# Patient Record
Sex: Female | Born: 1975 | Race: White | Hispanic: No | Marital: Single | Smoking: Current every day smoker
Health system: Southern US, Community
[De-identification: ages and names within clinical notes are randomized; demographics above are authoritative.]

## PROBLEM LIST (undated history)

## (undated) ENCOUNTER — Inpatient Hospital Stay (HOSPITAL_COMMUNITY): Payer: Self-pay

## (undated) DIAGNOSIS — IMO0002 Reserved for concepts with insufficient information to code with codable children: Secondary | ICD-10-CM

## (undated) DIAGNOSIS — B1920 Unspecified viral hepatitis C without hepatic coma: Secondary | ICD-10-CM

## (undated) DIAGNOSIS — K219 Gastro-esophageal reflux disease without esophagitis: Secondary | ICD-10-CM

## (undated) DIAGNOSIS — Z3490 Encounter for supervision of normal pregnancy, unspecified, unspecified trimester: Secondary | ICD-10-CM

## (undated) DIAGNOSIS — R51 Headache: Secondary | ICD-10-CM

## (undated) DIAGNOSIS — A64 Unspecified sexually transmitted disease: Secondary | ICD-10-CM

## (undated) DIAGNOSIS — R112 Nausea with vomiting, unspecified: Secondary | ICD-10-CM

## (undated) DIAGNOSIS — Q513 Bicornate uterus: Secondary | ICD-10-CM

## (undated) DIAGNOSIS — A749 Chlamydial infection, unspecified: Secondary | ICD-10-CM

## (undated) DIAGNOSIS — I10 Essential (primary) hypertension: Secondary | ICD-10-CM

## (undated) DIAGNOSIS — F419 Anxiety disorder, unspecified: Secondary | ICD-10-CM

## (undated) DIAGNOSIS — F1120 Opioid dependence, uncomplicated: Secondary | ICD-10-CM

## (undated) DIAGNOSIS — Z9889 Other specified postprocedural states: Secondary | ICD-10-CM

## (undated) DIAGNOSIS — Z331 Pregnant state, incidental: Secondary | ICD-10-CM

## (undated) HISTORY — DX: Gastro-esophageal reflux disease without esophagitis: K21.9

## (undated) HISTORY — PX: THERAPEUTIC ABORTION: SHX798

## (undated) HISTORY — DX: Reserved for concepts with insufficient information to code with codable children: IMO0002

## (undated) HISTORY — DX: Unspecified sexually transmitted disease: A64

## (undated) HISTORY — DX: Opioid dependence, uncomplicated: F11.20

## (undated) HISTORY — PX: DILATION AND CURETTAGE OF UTERUS: SHX78

## (undated) HISTORY — DX: Unspecified viral hepatitis C without hepatic coma: B19.20

## (undated) HISTORY — PX: TONSILLECTOMY: SUR1361

## (undated) HISTORY — DX: Bicornate uterus: Q51.3

---
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
## 1998-06-07 ENCOUNTER — Other Ambulatory Visit: Admission: RE | Admit: 1998-06-07 | Discharge: 1998-06-07 | Payer: Self-pay | Admitting: Obstetrics and Gynecology

## 1998-11-23 DIAGNOSIS — A749 Chlamydial infection, unspecified: Secondary | ICD-10-CM

## 1998-11-23 HISTORY — DX: Chlamydial infection, unspecified: A74.9

## 1999-03-16 ENCOUNTER — Encounter: Payer: Self-pay | Admitting: Obstetrics and Gynecology

## 1999-03-16 ENCOUNTER — Inpatient Hospital Stay (HOSPITAL_COMMUNITY): Admission: AD | Admit: 1999-03-16 | Discharge: 1999-03-16 | Payer: Self-pay | Admitting: Obstetrics and Gynecology

## 1999-04-03 ENCOUNTER — Other Ambulatory Visit: Admission: RE | Admit: 1999-04-03 | Discharge: 1999-04-03 | Payer: Self-pay | Admitting: *Deleted

## 1999-08-22 ENCOUNTER — Inpatient Hospital Stay (HOSPITAL_COMMUNITY): Admission: AD | Admit: 1999-08-22 | Discharge: 1999-08-22 | Payer: Self-pay | Admitting: Obstetrics and Gynecology

## 1999-10-14 ENCOUNTER — Inpatient Hospital Stay (HOSPITAL_COMMUNITY): Admission: AD | Admit: 1999-10-14 | Discharge: 1999-10-14 | Payer: Self-pay | Admitting: Obstetrics & Gynecology

## 1999-10-16 ENCOUNTER — Inpatient Hospital Stay (HOSPITAL_COMMUNITY): Admission: AD | Admit: 1999-10-16 | Discharge: 1999-10-16 | Payer: Self-pay | Admitting: Obstetrics and Gynecology

## 1999-10-24 ENCOUNTER — Inpatient Hospital Stay (HOSPITAL_COMMUNITY): Admission: AD | Admit: 1999-10-24 | Discharge: 1999-10-24 | Payer: Self-pay | Admitting: Obstetrics and Gynecology

## 1999-10-28 ENCOUNTER — Inpatient Hospital Stay (HOSPITAL_COMMUNITY): Admission: AD | Admit: 1999-10-28 | Discharge: 1999-10-30 | Payer: Self-pay | Admitting: Obstetrics and Gynecology

## 1999-11-01 ENCOUNTER — Encounter: Admission: RE | Admit: 1999-11-01 | Discharge: 2000-01-30 | Payer: Self-pay | Admitting: Obstetrics and Gynecology

## 1999-11-03 ENCOUNTER — Other Ambulatory Visit: Admission: RE | Admit: 1999-11-03 | Discharge: 1999-11-03 | Payer: Self-pay | Admitting: *Deleted

## 1999-11-03 ENCOUNTER — Encounter (INDEPENDENT_AMBULATORY_CARE_PROVIDER_SITE_OTHER): Payer: Self-pay | Admitting: Specialist

## 2000-03-01 ENCOUNTER — Encounter: Admission: RE | Admit: 2000-03-01 | Discharge: 2000-05-30 | Payer: Self-pay | Admitting: Obstetrics and Gynecology

## 2000-05-31 ENCOUNTER — Encounter: Admission: RE | Admit: 2000-05-31 | Discharge: 2000-08-29 | Payer: Self-pay | Admitting: Obstetrics and Gynecology

## 2000-08-31 ENCOUNTER — Encounter: Admission: RE | Admit: 2000-08-31 | Discharge: 2000-11-29 | Payer: Self-pay | Admitting: Obstetrics and Gynecology

## 2000-10-04 ENCOUNTER — Other Ambulatory Visit: Admission: RE | Admit: 2000-10-04 | Discharge: 2000-10-04 | Payer: Self-pay | Admitting: *Deleted

## 2000-12-01 ENCOUNTER — Encounter: Admission: RE | Admit: 2000-12-01 | Discharge: 2001-03-01 | Payer: Self-pay | Admitting: Obstetrics and Gynecology

## 2001-03-28 ENCOUNTER — Other Ambulatory Visit: Admission: RE | Admit: 2001-03-28 | Discharge: 2001-03-28 | Payer: Self-pay | Admitting: *Deleted

## 2001-03-31 ENCOUNTER — Encounter: Admission: RE | Admit: 2001-03-31 | Discharge: 2001-04-30 | Payer: Self-pay | Admitting: Obstetrics and Gynecology

## 2001-05-01 ENCOUNTER — Encounter: Admission: RE | Admit: 2001-05-01 | Discharge: 2001-05-31 | Payer: Self-pay | Admitting: Obstetrics and Gynecology

## 2001-06-29 ENCOUNTER — Emergency Department (HOSPITAL_COMMUNITY): Admission: EM | Admit: 2001-06-29 | Discharge: 2001-06-29 | Payer: Self-pay | Admitting: Emergency Medicine

## 2001-07-06 ENCOUNTER — Ambulatory Visit (HOSPITAL_COMMUNITY): Admission: RE | Admit: 2001-07-06 | Discharge: 2001-07-06 | Payer: Self-pay | Admitting: *Deleted

## 2001-07-30 ENCOUNTER — Emergency Department (HOSPITAL_COMMUNITY): Admission: EM | Admit: 2001-07-30 | Discharge: 2001-07-30 | Payer: Self-pay | Admitting: Emergency Medicine

## 2001-10-15 ENCOUNTER — Inpatient Hospital Stay (HOSPITAL_COMMUNITY): Admission: EM | Admit: 2001-10-15 | Discharge: 2001-10-19 | Payer: Self-pay | Admitting: Psychiatry

## 2001-11-23 HISTORY — PX: FINGER SURGERY: SHX640

## 2001-12-09 ENCOUNTER — Encounter: Admission: RE | Admit: 2001-12-09 | Discharge: 2001-12-09 | Payer: Self-pay | Admitting: *Deleted

## 2002-05-19 ENCOUNTER — Encounter: Admission: RE | Admit: 2002-05-19 | Discharge: 2002-05-19 | Payer: Self-pay | Admitting: *Deleted

## 2002-06-13 ENCOUNTER — Inpatient Hospital Stay (HOSPITAL_COMMUNITY): Admission: EM | Admit: 2002-06-13 | Discharge: 2002-06-20 | Payer: Self-pay | Admitting: Psychiatry

## 2002-07-01 ENCOUNTER — Inpatient Hospital Stay (HOSPITAL_COMMUNITY): Admission: EM | Admit: 2002-07-01 | Discharge: 2002-07-13 | Payer: Self-pay | Admitting: *Deleted

## 2002-07-07 ENCOUNTER — Encounter (INDEPENDENT_AMBULATORY_CARE_PROVIDER_SITE_OTHER): Payer: Self-pay | Admitting: Specialist

## 2002-07-25 ENCOUNTER — Emergency Department (HOSPITAL_COMMUNITY): Admission: EM | Admit: 2002-07-25 | Discharge: 2002-07-26 | Payer: Self-pay | Admitting: Emergency Medicine

## 2002-07-25 ENCOUNTER — Encounter: Payer: Self-pay | Admitting: Emergency Medicine

## 2002-07-26 ENCOUNTER — Inpatient Hospital Stay: Admission: AD | Admit: 2002-07-26 | Discharge: 2002-07-26 | Payer: Self-pay | Admitting: Obstetrics and Gynecology

## 2002-07-26 ENCOUNTER — Encounter: Payer: Self-pay | Admitting: Emergency Medicine

## 2002-11-14 ENCOUNTER — Encounter: Payer: Self-pay | Admitting: Emergency Medicine

## 2002-11-14 ENCOUNTER — Observation Stay (HOSPITAL_COMMUNITY): Admission: EM | Admit: 2002-11-14 | Discharge: 2002-11-15 | Payer: Self-pay | Admitting: Emergency Medicine

## 2003-05-27 ENCOUNTER — Inpatient Hospital Stay (HOSPITAL_COMMUNITY): Admission: AD | Admit: 2003-05-27 | Discharge: 2003-05-27 | Payer: Self-pay | Admitting: Obstetrics and Gynecology

## 2003-08-10 ENCOUNTER — Emergency Department (HOSPITAL_COMMUNITY): Admission: EM | Admit: 2003-08-10 | Discharge: 2003-08-11 | Payer: Self-pay | Admitting: Emergency Medicine

## 2003-08-10 ENCOUNTER — Encounter: Payer: Self-pay | Admitting: Emergency Medicine

## 2003-08-11 ENCOUNTER — Encounter: Payer: Self-pay | Admitting: Emergency Medicine

## 2003-08-11 ENCOUNTER — Emergency Department (HOSPITAL_COMMUNITY): Admission: EM | Admit: 2003-08-11 | Discharge: 2003-08-12 | Payer: Self-pay | Admitting: Emergency Medicine

## 2003-08-15 ENCOUNTER — Encounter: Payer: Self-pay | Admitting: Internal Medicine

## 2003-08-15 ENCOUNTER — Inpatient Hospital Stay (HOSPITAL_COMMUNITY): Admission: EM | Admit: 2003-08-15 | Discharge: 2003-08-17 | Payer: Self-pay

## 2003-08-16 ENCOUNTER — Encounter: Payer: Self-pay | Admitting: Internal Medicine

## 2003-08-21 ENCOUNTER — Inpatient Hospital Stay (HOSPITAL_COMMUNITY): Admission: EM | Admit: 2003-08-21 | Discharge: 2003-08-22 | Payer: Self-pay | Admitting: Emergency Medicine

## 2003-08-28 ENCOUNTER — Emergency Department (HOSPITAL_COMMUNITY): Admission: EM | Admit: 2003-08-28 | Discharge: 2003-08-29 | Payer: Self-pay | Admitting: *Deleted

## 2003-08-30 ENCOUNTER — Emergency Department (HOSPITAL_COMMUNITY): Admission: EM | Admit: 2003-08-30 | Discharge: 2003-08-31 | Payer: Self-pay | Admitting: Emergency Medicine

## 2003-08-31 ENCOUNTER — Encounter: Payer: Self-pay | Admitting: Emergency Medicine

## 2003-08-31 ENCOUNTER — Emergency Department (HOSPITAL_COMMUNITY): Admission: EM | Admit: 2003-08-31 | Discharge: 2003-08-31 | Payer: Self-pay | Admitting: Emergency Medicine

## 2003-09-04 ENCOUNTER — Encounter: Payer: Self-pay | Admitting: Emergency Medicine

## 2003-09-04 ENCOUNTER — Inpatient Hospital Stay (HOSPITAL_COMMUNITY): Admission: EM | Admit: 2003-09-04 | Discharge: 2003-09-07 | Payer: Self-pay | Admitting: Psychiatry

## 2003-10-31 ENCOUNTER — Emergency Department (HOSPITAL_COMMUNITY): Admission: EM | Admit: 2003-10-31 | Discharge: 2003-11-01 | Payer: Self-pay | Admitting: Emergency Medicine

## 2003-11-01 ENCOUNTER — Emergency Department (HOSPITAL_COMMUNITY): Admission: EM | Admit: 2003-11-01 | Discharge: 2003-11-01 | Payer: Self-pay | Admitting: Emergency Medicine

## 2003-11-05 ENCOUNTER — Emergency Department (HOSPITAL_COMMUNITY): Admission: EM | Admit: 2003-11-05 | Discharge: 2003-11-06 | Payer: Self-pay | Admitting: Emergency Medicine

## 2003-11-05 ENCOUNTER — Emergency Department (HOSPITAL_COMMUNITY): Admission: EM | Admit: 2003-11-05 | Discharge: 2003-11-05 | Payer: Self-pay | Admitting: Emergency Medicine

## 2003-11-12 ENCOUNTER — Other Ambulatory Visit: Admission: RE | Admit: 2003-11-12 | Discharge: 2003-11-12 | Payer: Self-pay | Admitting: Obstetrics and Gynecology

## 2004-03-18 ENCOUNTER — Emergency Department (HOSPITAL_COMMUNITY): Admission: EM | Admit: 2004-03-18 | Discharge: 2004-03-18 | Payer: Self-pay | Admitting: Emergency Medicine

## 2004-03-18 ENCOUNTER — Inpatient Hospital Stay (HOSPITAL_COMMUNITY): Admission: AD | Admit: 2004-03-18 | Discharge: 2004-03-18 | Payer: Self-pay | Admitting: Obstetrics and Gynecology

## 2004-05-26 ENCOUNTER — Inpatient Hospital Stay (HOSPITAL_COMMUNITY): Admission: AD | Admit: 2004-05-26 | Discharge: 2004-05-26 | Payer: Self-pay | Admitting: Obstetrics and Gynecology

## 2004-07-04 ENCOUNTER — Emergency Department (HOSPITAL_COMMUNITY): Admission: EM | Admit: 2004-07-04 | Discharge: 2004-07-04 | Payer: Self-pay | Admitting: Family Medicine

## 2004-07-14 ENCOUNTER — Inpatient Hospital Stay (HOSPITAL_COMMUNITY): Admission: AD | Admit: 2004-07-14 | Discharge: 2004-07-15 | Payer: Self-pay | Admitting: Obstetrics and Gynecology

## 2004-07-20 ENCOUNTER — Emergency Department (HOSPITAL_COMMUNITY): Admission: EM | Admit: 2004-07-20 | Discharge: 2004-07-20 | Payer: Self-pay | Admitting: Emergency Medicine

## 2004-09-15 ENCOUNTER — Emergency Department (HOSPITAL_COMMUNITY): Admission: EM | Admit: 2004-09-15 | Discharge: 2004-09-15 | Payer: Self-pay | Admitting: Family Medicine

## 2004-09-16 ENCOUNTER — Emergency Department (HOSPITAL_COMMUNITY): Admission: EM | Admit: 2004-09-16 | Discharge: 2004-09-16 | Payer: Self-pay | Admitting: Emergency Medicine

## 2004-11-11 ENCOUNTER — Emergency Department (HOSPITAL_COMMUNITY): Admission: EM | Admit: 2004-11-11 | Discharge: 2004-11-11 | Payer: Self-pay | Admitting: Family Medicine

## 2004-11-19 ENCOUNTER — Emergency Department (HOSPITAL_COMMUNITY): Admission: EM | Admit: 2004-11-19 | Discharge: 2004-11-19 | Payer: Self-pay | Admitting: *Deleted

## 2004-12-01 ENCOUNTER — Emergency Department (HOSPITAL_COMMUNITY): Admission: EM | Admit: 2004-12-01 | Discharge: 2004-12-01 | Payer: Self-pay | Admitting: Family Medicine

## 2005-01-10 ENCOUNTER — Emergency Department (HOSPITAL_COMMUNITY): Admission: EM | Admit: 2005-01-10 | Discharge: 2005-01-10 | Payer: Self-pay | Admitting: Emergency Medicine

## 2005-01-23 ENCOUNTER — Ambulatory Visit: Payer: Self-pay | Admitting: Psychiatry

## 2005-01-23 ENCOUNTER — Inpatient Hospital Stay (HOSPITAL_COMMUNITY): Admission: RE | Admit: 2005-01-23 | Discharge: 2005-01-29 | Payer: Self-pay | Admitting: Psychiatry

## 2005-02-27 ENCOUNTER — Emergency Department (HOSPITAL_COMMUNITY): Admission: EM | Admit: 2005-02-27 | Discharge: 2005-02-27 | Payer: Self-pay | Admitting: Family Medicine

## 2005-03-11 ENCOUNTER — Emergency Department (HOSPITAL_COMMUNITY): Admission: EM | Admit: 2005-03-11 | Discharge: 2005-03-11 | Payer: Self-pay | Admitting: Emergency Medicine

## 2005-03-26 ENCOUNTER — Ambulatory Visit: Payer: Self-pay | Admitting: Psychiatry

## 2005-03-26 ENCOUNTER — Inpatient Hospital Stay (HOSPITAL_COMMUNITY): Admission: RE | Admit: 2005-03-26 | Discharge: 2005-03-30 | Payer: Self-pay | Admitting: Psychiatry

## 2005-04-13 ENCOUNTER — Emergency Department (HOSPITAL_COMMUNITY): Admission: EM | Admit: 2005-04-13 | Discharge: 2005-04-13 | Payer: Self-pay | Admitting: Emergency Medicine

## 2005-06-11 ENCOUNTER — Emergency Department (HOSPITAL_COMMUNITY): Admission: EM | Admit: 2005-06-11 | Discharge: 2005-06-11 | Payer: Self-pay | Admitting: Emergency Medicine

## 2005-07-09 ENCOUNTER — Emergency Department (HOSPITAL_COMMUNITY): Admission: EM | Admit: 2005-07-09 | Discharge: 2005-07-09 | Payer: Self-pay | Admitting: Emergency Medicine

## 2005-10-02 ENCOUNTER — Other Ambulatory Visit: Admission: RE | Admit: 2005-10-02 | Discharge: 2005-10-02 | Payer: Self-pay | Admitting: Family Medicine

## 2005-12-23 ENCOUNTER — Emergency Department (HOSPITAL_COMMUNITY): Admission: EM | Admit: 2005-12-23 | Discharge: 2005-12-23 | Payer: Self-pay | Admitting: Family Medicine

## 2005-12-25 ENCOUNTER — Ambulatory Visit (HOSPITAL_COMMUNITY): Admission: RE | Admit: 2005-12-25 | Discharge: 2005-12-25 | Payer: Self-pay | Admitting: Family Medicine

## 2006-01-26 ENCOUNTER — Emergency Department (HOSPITAL_COMMUNITY): Admission: EM | Admit: 2006-01-26 | Discharge: 2006-01-26 | Payer: Self-pay | Admitting: Emergency Medicine

## 2006-02-02 ENCOUNTER — Inpatient Hospital Stay (HOSPITAL_COMMUNITY): Admission: AD | Admit: 2006-02-02 | Discharge: 2006-02-02 | Payer: Self-pay | Admitting: Obstetrics and Gynecology

## 2006-08-24 ENCOUNTER — Emergency Department (HOSPITAL_COMMUNITY): Admission: EM | Admit: 2006-08-24 | Discharge: 2006-08-24 | Payer: Self-pay | Admitting: Emergency Medicine

## 2006-10-30 ENCOUNTER — Emergency Department (HOSPITAL_COMMUNITY): Admission: EM | Admit: 2006-10-30 | Discharge: 2006-10-30 | Payer: Self-pay | Admitting: Emergency Medicine

## 2007-01-23 ENCOUNTER — Emergency Department (HOSPITAL_COMMUNITY): Admission: EM | Admit: 2007-01-23 | Discharge: 2007-01-24 | Payer: Self-pay | Admitting: Emergency Medicine

## 2007-04-05 ENCOUNTER — Inpatient Hospital Stay (HOSPITAL_COMMUNITY): Admission: RE | Admit: 2007-04-05 | Discharge: 2007-04-08 | Payer: Self-pay | Admitting: Psychiatry

## 2007-04-05 ENCOUNTER — Ambulatory Visit: Payer: Self-pay | Admitting: Psychiatry

## 2007-06-28 ENCOUNTER — Emergency Department (HOSPITAL_COMMUNITY): Admission: EM | Admit: 2007-06-28 | Discharge: 2007-06-28 | Payer: Self-pay | Admitting: Emergency Medicine

## 2007-08-17 ENCOUNTER — Emergency Department (HOSPITAL_COMMUNITY): Admission: EM | Admit: 2007-08-17 | Discharge: 2007-08-17 | Payer: Self-pay | Admitting: Emergency Medicine

## 2008-11-23 DIAGNOSIS — I10 Essential (primary) hypertension: Secondary | ICD-10-CM

## 2008-11-23 HISTORY — DX: Essential (primary) hypertension: I10

## 2010-05-07 ENCOUNTER — Emergency Department (HOSPITAL_COMMUNITY): Admission: EM | Admit: 2010-05-07 | Discharge: 2010-05-07 | Payer: Self-pay | Admitting: Emergency Medicine

## 2010-10-11 ENCOUNTER — Emergency Department (HOSPITAL_COMMUNITY): Admission: EM | Admit: 2010-10-11 | Discharge: 2010-10-12 | Payer: Self-pay | Admitting: Emergency Medicine

## 2010-11-14 ENCOUNTER — Emergency Department (HOSPITAL_COMMUNITY)
Admission: EM | Admit: 2010-11-14 | Discharge: 2010-11-14 | Payer: Self-pay | Source: Home / Self Care | Admitting: Emergency Medicine

## 2010-11-23 NOTE — L&D Delivery Note (Signed)
Delivery Note At 5:43 PM a non-viable female was delivered via normal spontaneous vaginal delivery, (Presentation: occiput anterior).     Placenta status: delivered intact. Cord:  3 vessel cord.  No complications.    Anesthesia:  Fentanyl PCA Episiotomy: none Lacerations: none Est. Blood Loss (mL): less than 300 mL  Mom to postpartum.  Baby to NA.  Yittel Emrich JEHIEL 09/15/2011, 6:06 PM

## 2011-01-18 ENCOUNTER — Emergency Department (HOSPITAL_COMMUNITY)
Admission: EM | Admit: 2011-01-18 | Discharge: 2011-01-19 | Disposition: A | Discharge status: Home / Self Care | Payer: Self-pay | Attending: Emergency Medicine | Admitting: Emergency Medicine

## 2011-01-18 DIAGNOSIS — F1120 Opioid dependence, uncomplicated: Secondary | ICD-10-CM | POA: Insufficient documentation

## 2011-01-18 DIAGNOSIS — N898 Other specified noninflammatory disorders of vagina: Secondary | ICD-10-CM | POA: Insufficient documentation

## 2011-01-18 DIAGNOSIS — F17200 Nicotine dependence, unspecified, uncomplicated: Secondary | ICD-10-CM | POA: Insufficient documentation

## 2011-01-18 DIAGNOSIS — E669 Obesity, unspecified: Secondary | ICD-10-CM | POA: Insufficient documentation

## 2011-01-18 DIAGNOSIS — F19939 Other psychoactive substance use, unspecified with withdrawal, unspecified: Secondary | ICD-10-CM | POA: Insufficient documentation

## 2011-01-18 LAB — URINALYSIS, ROUTINE W REFLEX MICROSCOPIC
Bilirubin Urine: NEGATIVE
Hgb urine dipstick: NEGATIVE
Ketones, ur: NEGATIVE mg/dL
Nitrite: NEGATIVE
Protein, ur: NEGATIVE mg/dL
Specific Gravity, Urine: 1.013 (ref 1.005–1.030)
Urine Glucose, Fasting: NEGATIVE mg/dL
Urobilinogen, UA: 1 mg/dL (ref 0.0–1.0)
pH: 7 (ref 5.0–8.0)

## 2011-01-18 LAB — DIFFERENTIAL
Basophils Absolute: 0 10*3/uL (ref 0.0–0.1)
Basophils Relative: 0 % (ref 0–1)
Eosinophils Relative: 1 % (ref 0–5)
Lymphocytes Relative: 38 % (ref 12–46)
Neutro Abs: 4.6 10*3/uL (ref 1.7–7.7)

## 2011-01-18 LAB — POCT I-STAT, CHEM 8
BUN: <3 mg/dL — ABNORMAL LOW (ref 6–23)
Calcium, Ion: 1.21 mmol/L (ref 1.12–1.32)
Chloride: 102 mEq/L (ref 96–112)
Creatinine, Ser: 0.8 mg/dL (ref 0.4–1.2)
Glucose, Bld: 100 mg/dL — ABNORMAL HIGH (ref 70–99)
HCT: 47 % — ABNORMAL HIGH (ref 36.0–46.0)
Hemoglobin: 16 g/dL — ABNORMAL HIGH (ref 12.0–15.0)
Potassium: 3.7 mEq/L (ref 3.5–5.1)
Sodium: 138 mEq/L (ref 135–145)
TCO2: 24 mmol/L (ref 0–100)

## 2011-01-18 LAB — POCT PREGNANCY, URINE: Preg Test, Ur: NEGATIVE

## 2011-01-18 LAB — WET PREP, GENITAL
Clue Cells Wet Prep HPF POC: NONE SEEN
Trich, Wet Prep: NONE SEEN
Yeast Wet Prep HPF POC: NONE SEEN

## 2011-01-18 LAB — CBC
HCT: 43.9 % (ref 36.0–46.0)
Platelets: 333 10*3/uL (ref 150–400)
RDW: 15.6 % — ABNORMAL HIGH (ref 11.5–15.5)
WBC: 9.1 10*3/uL (ref 4.0–10.5)

## 2011-01-20 LAB — GC/CHLAMYDIA PROBE AMP, GENITAL
Chlamydia, DNA Probe: NEGATIVE
GC Probe Amp, Genital: NEGATIVE

## 2011-02-02 LAB — DIFFERENTIAL
Basophils Absolute: 0 10*3/uL (ref 0.0–0.1)
Lymphocytes Relative: 24 % (ref 12–46)
Lymphs Abs: 2.3 10*3/uL (ref 0.7–4.0)
Neutro Abs: 6.5 10*3/uL (ref 1.7–7.7)
Neutrophils Relative %: 69 % (ref 43–77)

## 2011-02-02 LAB — BASIC METABOLIC PANEL
Calcium: 10.2 mg/dL (ref 8.4–10.5)
Chloride: 103 mEq/L (ref 96–112)
Creatinine, Ser: 0.89 mg/dL (ref 0.4–1.2)
GFR calc Af Amer: >60 mL/min (ref >60)
GFR calc non Af Amer: >60 mL/min (ref >60)

## 2011-02-02 LAB — CBC
Platelets: 367 10*3/uL (ref 150–400)
RBC: 5.49 MIL/uL — ABNORMAL HIGH (ref 3.87–5.11)
WBC: 9.5 10*3/uL (ref 4.0–10.5)

## 2011-02-02 LAB — POCT PREGNANCY, URINE: Preg Test, Ur: NEGATIVE

## 2011-02-03 LAB — POCT I-STAT, CHEM 8
BUN: 4 mg/dL — ABNORMAL LOW (ref 6–23)
Calcium, Ion: 1.01 mmol/L — ABNORMAL LOW (ref 1.12–1.32)
Creatinine, Ser: 0.7 mg/dL (ref 0.4–1.2)
Hemoglobin: 15 g/dL (ref 12.0–15.0)
Sodium: 133 mEq/L — ABNORMAL LOW (ref 135–145)
TCO2: 26 mmol/L (ref 0–100)

## 2011-02-03 LAB — URINALYSIS, ROUTINE W REFLEX MICROSCOPIC
Leukocytes, UA: NEGATIVE
Nitrite: NEGATIVE
Specific Gravity, Urine: 1.012 (ref 1.005–1.030)
Urobilinogen, UA: 0.2 mg/dL (ref 0.0–1.0)
pH: 6 (ref 5.0–8.0)

## 2011-02-03 LAB — DIFFERENTIAL
Basophils Relative: 1 % (ref 0–1)
Eosinophils Absolute: 0 10*3/uL (ref 0.0–0.7)
Eosinophils Relative: 0 % (ref 0–5)
Lymphs Abs: 1.9 10*3/uL (ref 0.7–4.0)
Monocytes Relative: 8 % (ref 3–12)
Neutrophils Relative %: 72 % (ref 43–77)

## 2011-02-03 LAB — CBC
Hemoglobin: 13.2 g/dL (ref 12.0–15.0)
MCH: 26.2 pg (ref 26.0–34.0)
MCHC: 34 g/dL (ref 30.0–36.0)
MCV: 77 fL — ABNORMAL LOW (ref 78.0–100.0)
Platelets: 307 10*3/uL (ref 150–400)

## 2011-02-03 LAB — URINE MICROSCOPIC-ADD ON

## 2011-02-03 LAB — POCT CARDIAC MARKERS: Myoglobin, poc: 94.4 ng/mL (ref 12–200)

## 2011-04-07 NOTE — H&P (Signed)
[name], [name]            ACCOUNT NO.:  [id number]   MEDICAL RECORD NO.:  [id number]          PATIENT TYPE:  IPS   LOCATION:  0306                          [facility]ILITY[facility]  [facility]   PHYSICIAN:  Anselm Jungling, MD  DATE OF BIRTH:  1976-08-13   DATE OF ADMISSION:  04/05/2007  DATE OF DISCHARGE:                       PSYCHIATRIC ADMISSION ASSESSMENT   IDENTIFYING INFORMATION:  This is a 35 year old single white female.  This is a voluntary admission.   CHIEF COMPLAINT:  I want to be clean again; my life was better when I  was clean; things were going well.   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  She has a history of cocaine abuse and alcohol abuse and is in  remission for more than a year with both of those substances.  She  started taking Percocet on the weekend with her current partner in  January of 2007.  This use escalated throughout the year until both her  and her partner decided that they wanted to be clean and abstinent off  of the oral opiates and began to try to detox themselves in December and  got involved in using IV heroin at that point and her use has escalated  over the course of the last six months to using about nine bags daily  over the course of the last month with last use being two bags on the  morning of admission Apr 05, 2007.  She would like to get completely  clean again.  She does have a drug possession charges since December of  2007 with community service pending.  If she can remain abstinent, the  charge will be expunged from her record.  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  Denies homicidal thought or any hallucinations.   PAST PSYCHIATRIC HISTORY:  This is one of several admissions for this  patient with history of polysubstance abuse and bipolar disorder.  Last  admission  being Mar 26, 2005 to Mar 30, 2005.  She has a history of  polysubstance abuse as noted above with alcohol use starting in her  teens.  Significant history of cocaine abuse, currently abstinent from  those substances for more than a year.  She is currently stable.  She is  not currently followed by a psychiatrist.  Has been previously  stabilized on Lexapro and lithium for her bipolar disorder and is  requesting to restart on those medications.   SOCIAL HISTORY:  The patient is a Conservator, museum/gallery.  Currently  living with her boyfriend in a stable two-year relationship.  She has  two children that live with her uncle and his wife who provide full-time  care for them.  Children are 65 and 54 years of age.  She does have a  history of drug possession charge in 2007.  She currently has rented her  own apartment in order to maintain sobriety and has set the apartment up  so she will have  a safe place to go when she leaves here.   FAMILY HISTORY:  Noncontributory.   ALCOHOL/DRUG HISTORY:  As noted above.   MEDICAL HISTORY:  The patient is followed by Dr. Wynelle Link.  Current medical  problems include transaminitis with history of IV drug use.  Past  medical history is remarkable for normal vaginal delivery x2.  Does have  a distant history of some STDs.  This is her first episode of IV drug  use.  She has no known history of viral hepatitis or other liver  disease, although her past record show elevations in transaminases,  primarily the SGOT coinciding with alcohol abuse.  Denies any history of  bleeding from stomach or bowels.  Denies any known history of seizure.   MEDICATIONS:  Current medications are none.  She is currently taking no  medications at home.   ALLERGIES:  None.   REVIEW OF SYSTEMS:  Sleep decreased to only about an hour last night,  having a lot of restlessness, sweating, complaining of prominent  symptoms of formication, intestinal cramping, has had some  constipation,  now having loose stools, fairly uncomfortable, complaining of clear  rhinorrhea.  No chest pain.  Denies fever, chills or rash.  Having  significant muscle aches and feeling that she has to stretch her muscles  every few minutes or so.   PHYSICAL EXAMINATION:  GENERAL:  Well-nourished, well-developed female  who appears in mild distress.  Grooming is adequate.  She is mildly  disheveled.  VITAL SIGNS:  Height 5 feet 3 inches tall, weight 126.5 pounds,  temperature 97, pulse 60, respirations 20, blood pressure 140/92.  HEAD:  Normocephalic and atraumatic.  EENT:  Pupils are 4 mm, extraocular movements are normal with no  nystagmus.  She does have positive clear rhinorrhea.  Oropharynx in  satisfactory condition.  She is wearing glasses.  NECK:  Supple.  No thyromegaly.  No bruits heard.  No lymphadenopathy.  CHEST:  Clear to auscultation.  BREAST:  Exam deferred.  CARDIOVASCULAR:  S1-S2 is heard.  No clicks, murmurs, gallops or extra  sounds.  ABDOMEN:  Flat, soft, nontender.  Hyperactive bowel sounds are present.  GENITOURINARY:  Deferred.  EXTREMITIES:  No clubbing, no cyanosis.  Color is good.  Pulses are 2+.  SKIN:  Prominent gooseflesh over her extremities.  Skin is intact.  No  signs of self-mutilation.  NEUROLOGIC:  She does have several tattoos including one on her left  lower abdomen and one on her right deltoid which is a [place] symbol for  happiness.  Cranial nerves 2-12 are intact.  Extraocular movements are  normal without nystagmus.  She shows just a trace of hyperreflexia in  her deep tendon reflexes.  Motor is normal.  Sensory normal.  Romberg is  without findings.  Also pupils are minimally reactive to light.   LABORATORY DATA:  CBC revealed WBC 10.3, hemoglobin 14.6, hematocrit  43.8 and platelets 192,000, MCV is 85.4.  Chemistries revealed sodium 141, potassium 4.2, chloride 102, carbon dioxide 31, BUN 6, creatinine  0.59 and random glucose is 94.   Liver enzymes revealed SGOT 130, SGPT  332, alkaline phosphatase is 118 and total bilirubin is 0.8, calcium  10.1, TSH 0.311.  Urine for UA and UPT and UDS is currently pending.   MENTAL STATUS EXAM:  Fully alert, pleasant and cooperative who clearly  appears to be in detox from the opiates.  She is polite, cooperative.  Speech is normal in pace, tone and amount.  Affect is anxious.  She is  articulate.  Speech is fluent.  Mood is euthymic.  She is quite  determined to be clean and sober and had been told that she should ask  about Suboxone treatment for her opiate addiction but she is quite clear  that she wants to be clean and sober.  We have discussed the relative  merits and agreed that she should continue with detox and she is in  agreement with that plan.  No signs of suicidal thought or homicidal  thought.  Thoughts are logical and coherent, goal-directed.  Cognition  is intact.  Her concentration is decreased and her calculation is a bit  off but judgment and impulse control are within normal limits.  She  expresses a lot of motivation to be clean and sober to improve her life  and to be able to get her children back and to be a good mother.   DIAGNOSES:  AXIS I:  Opiate dependence with acute withdrawal.  cocaine  abuse in current remission for more than a year.  Bipolar disorder not  otherwise specified.  AXIS II:  Deferred.  AXIS III:  Transaminitis, rule out viral hepatitis.  AXIS IV:  Moderate (issues with legal problems and social functioning).  AXIS V:  Current 25; past year 54.   PLAN:  Voluntarily admit the patient with 15-minute checks in place.  We  will check a free T3, T4 given her borderline TSH and will do an acute  hepatitis antibody panel and will repeat her RPR.  Her HIV has been  negative within the past three weeks.  Meanwhile, we started her on a  clonidine protocol and will also get a urine for UA, UDS and a UPT.   ESTIMATED LENGTH OF STAY:  Five to seven  days and she is in agreement  with the plan.      Margaret A. Lorin Picket, N.P.      Anselm Jungling, MD  Electronically Signed    MAS/MEDQ  D:  04/06/2007  T:  04/06/2007  Job:  [id number]

## 2011-04-10 NOTE — Op Note (Signed)
   NAMESURIAH, [name]                        ACCOUNT NO.:  [id number]   MEDICAL RECORD NO.:  [id number]                   PATIENT TYPE:  OBV   LOCATION:  1826                                 FACILITY:  [facility]   PHYSICIAN:  Cindee Salt, M.D.                    DATE OF BIRTH:  [birth year]   DATE OF PROCEDURE:  11/14/2002  DATE OF DISCHARGE:                                 OPERATIVE REPORT   PREOPERATIVE DIAGNOSIS:  Laceration/saw injury, right index finger.   POSTOPERATIVE DIAGNOSIS:  Laceration/saw injury, right index finger.   OPERATION:  Repair of artery, nerve and tendon, right index finger, radial  side.   SURGEON:  Cindee Salt, M.D.   ASSISTANT:  Alfredo Bach, R.N.   ANESTHESIA:  General.   HISTORY:  The patient is a 35 year old female with a saw injury to the  radial aspect of her index finger with an obvious fracture on the x-ray into  the middle phalanx/proximal phalanx.   DESCRIPTION OF PROCEDURE:  The patient was brought to the operating room  where a general endotracheal intubation and anesthesia were carried out  without difficulty.  She was prepped and draped using Betadine scrubbing  solution, right arm free.  The limb was exsanguinated with an Esmarch  bandage.  Tourniquet placed high on the arm was inflated to 250 mmHg.  Wound  was opened, irrigated and debrided.  The flexor tendon superficialis radial  slip was found to be partially lacerated and this was repaired with figure-  of-eight 6-0 M[place]ilene suture.  The digital artery and nerve were found to  be lacerated.  The operative microscope was brought into position and the  ends of the artery and nerve cut back to normal intima and normal fascicles,  a repair performed, aligning fascicles, with interrupted 9-0 nylon sutures.  The artery was repaired with interrupted 9-0 nylon sutures.  No further  lesions were identified.  The wound was irrigated.  The skin was closed with  interrupted 5-0 nylon sutures.   Sterile compressive dressing and splint were  applied.  The patient tolerated the procedure well and was taken to the  recovery room for observation in satisfactory condition.   She is discharged home to return to the [facility] [place] in one  week on Vicodin and Keflex.                                               Cindee Salt, M.D.    GK/MEDQ  D:  11/14/2002  T:  11/15/2002  Job:  [id number]

## 2011-04-10 NOTE — H&P (Signed)
[facility]  Patient:    [name], [name] Visit Number: [id number] MRN: [id number]          Service Type: PSY Location: 500 0503 01 Attending Physician:  Rachael Fee Dictated by:   Young Berry Scott, N.P. Admit Date:  10/15/2001                     Psychiatric Admission Assessment  DATE OF ASSESSMENT:  October 16, 2001 at 9:45 a.m.  IDENTIFYING INFORMATION:  This is a 35 year old single Caucasian female, who is a voluntary admission.  HISTORY OF PRESENT ILLNESS:  This patient presented herself to the assessment team office requesting help and having feelings of "wanting to be dead" without specific plan or intent.  The patient had relapsed on alcohol and abusing Xanax for the past two weeks.  The patient is normally prescribed Xanax 2 mg q.i.d. to use on a p.r.n. basis for her panic attacks and she states that without the alcohol the Xanax has worked well for her in combination with the Celexa.  However, her panic increased in response to recent stressor in her life and she relapsed on alcohol and subsequently was suffering more panic attacks using up to 16 mg per day of Xanax for the past 1-2 weeks.  The patient initial relapse, she reports, was due to finding that her mother had relapsed on cocaine.  The patient went looking for her and found her at a crack house and these events caused the patient to relapse on alcohol and have additional panic attacks.  The patient then reports some fleeting thoughts of passive suicidal ideation, wishing that she was dead and did not have to face her issues but she had not gone as far as formulating a plan or having any specific intent.  She has been drinking approximately 4-5 days a week, 3-4 beers, and then binged on November 22nd on a large amount of alcohol and took several "extra Xanax."  The patient denies any auditory or visual hallucinations.  She is having frequent panic attacks, 2-3 a day,  when dealing with her mother.  She denies any auditory or visual hallucinations.  PAST PSYCHIATRIC HISTORY:  The patient is seen by Dr. Milford Cage in the outpatient clinic at [facility].  This is her first psychiatric admission.  She has been drinking since age 3, was sober some time, then relapsed in August and was treated, then became sober until three weeks ago when she relapsed again.  The patient has been treated for depression in the past and panic disorder.  She has no history of prior suicide attempts.  SOCIAL HISTORY:  The patient is single.  She has two children, age 68 and 2, and is currently studying at [employer] but now states she will be expelled for absenteeism based on her recent absences over the past two weeks.  The patient receives financial help from her uncle and her aunt with whom she currently lives with the children.  The patient does have a DUI pending from June 24, 2001 with no court date set at this time.  FAMILY HISTORY:  Father and mother with history of substance abuse.  ALCOHOL/DRUG HISTORY:  The patient denies any other drug abuse other than that noted above with the Xanax.  The patients urine drug screen is currently pending.  MEDICAL HISTORY:  The patient is followed by Dr. Ronette Deter at [facility] and last seen there several months ago for a sinus  infection. Medical problems, currently, are none.  Past medical history is remarkable for episodes of asthma with none recently and occasional migraine headaches.  MEDICATIONS:  Celexa 20 mg p.o. daily, Xanax 2 mg q.i.d. p.r.n., Depo-Provera every three months with her last injection three weeks ago.  DRUG ALLERGIES:  CODEINE.  REVIEW OF SYSTEMS:  The patients only somatic complaint today is frequent panic attacks and she stated that she did have one last night, which was one precipitating event to have her come in for help.  CARDIAC:  The patient reports some palpitations and shortness  of breath, feels like she has rapid heartbeat when she has a panic attack but otherwise no palpitations, no shortness of breath, tolerates exercise well.  PULMONARY:  The patient does have a history of asthma but cannot remember.  She does get a bit short of breath when she is feeling very, very anxious but otherwise no acute asthma attacks in more than a year.  NEURO:  The patient has some history of migraine headaches.  She has not had a headache for several weeks.  She has no history of seizures.  HEMATOLOGY:  No history of anemia.  GASTROINTESTINAL:  Bowels are regular without laxatives.  GENITOURINARY:  The patient denies any frequency or dysuria.  MUSCULOSKELETAL:  No joint pain.  Spine is straight. The patient has no complaints of pain or swelling in any joints. REPRODUCTIVE:  The patient keeps regular appointments for her Depo-Provera injections and her preventive care is up-to-date.  PHYSICAL EXAMINATION:  GENERAL:  This is a well-nourished, well-developed, healthy-appearing female, who is in no acute distress.  VITAL SIGNS:  Within normal limits at the time of admission.  HEAD:  Normocephalic.  EENT:  PERRLA.  Hearing intact to normal voice.  No rhinorrhea.  Oropharynx is noninjected.  Mucosa is healthy in appearance.  Tongue is midline without fasciculations.  NECK:  Supple.  No thyromegaly.  CARDIOVASCULAR:  S1 and S2 heard.  No clicks, murmurs or gallops.  Extremities are pink and warm.  No swelling in lower extremities.  Capillary refill good in digits.  LUNGS:  Clear to auscultation.  ABDOMEN:  Soft without masses or tenderness.  No costovertebral tenderness.  GENITALIA:  Deferred.  BREASTS:  Exam is deferred.  MUSCULOSKELETAL:  Strength is 5/5 throughout.  No erythema or swelling of joints.  Spine is straight.  Posture is upright.  NEURO:  Motor movements are smooth without tremor.  Sensory is grossly intact. Extraocular movements are intact.  Cranial  nerves II-XII are intact.  Deep tendon reflexes are 1+/5.  Romberg is without findings.   MENTAL STATUS EXAMINATION:  This is a fully alert, healthy-appearing Caucasian female in no acute distress.  Her affect is actually quite bright.  She has no evidence of tremulousness or anxiety.  She is cooperative and polite.  Speech is normal and relevant.  Mood seems fairly euthymic.  Thought processes is logical and goal directed with no evidence psychosis.  No homicidal ideation. No suicidal ideation.  She promises safety on the unit.  Her insight into her current situation is quite good and she is able to describe the specific activities and stressors that caused her to relapse and is able to look at her mothers current situation and her own stressors fairly objectively.  She does not really have any intent, at this point, of getting off the Xanax and feels she needs to be on it.  She has been drinking quite a bit of alcohol, although  she is sober and clearheaded for the interview.  No evidence of homicidal ideation.  Cognitively, she is intact and oriented x 4.  Intelligence is average to above average.  Insight good.  Judgment fair.  Impulse control within normal limits.  DIAGNOSES: Axis I:    1. Alcohol abuse; rule out dependence.            2. Major depression not otherwise specified.            3. Panic disorder not otherwise specified. Axis II:   Deferred. Axis III:  History of asthma. Axis IV:   Deferred at this time. Axis V:    Current 34; past year 23.  PLAN:  Voluntarily admit the patient to detox her from alcohol and detox her from Xanax, although we may only do that partially.  We will have to spend some more time with her and evaluate whether we need to leave her on the Xanax for her panic disorder or if we will select something else.  Her full labs are currently pending.  Meanwhile, we have chosen to initiate a low-dose Librium protocol to try to control her anxiety and  detox her from the alcohol. Meanwhile, we will also continue her routine dose of Celexa while she is here and consider a mood stabilizer as she gets a little bit further down the way with detox.  We are holding her Xanax dose for now and we have allowed her have an albuterol inhaler on a p.r.n. basis.  This plan has been reviewed with the patient and she is agreeable at this time.  ESTIMATED LENGTH OF STAY:  Two to four days. Dictated by:   Young Berry Scott, N.P. Attending Physician:  Rachael Fee DD:  10/18/01 TD:  10/18/01 Job: [id number]

## 2011-04-10 NOTE — Discharge Summary (Signed)
[facility]  Patient:    [name], [name] Visit Number: [id number] MRN: [id number]          Service Type: PSY Location: 500 0503 01 Attending Physician:  Jeanice Lim Dictated by:   Jeanice Lim, M.D. Admit Date:  10/15/2001 Discharge Date: 10/19/2001                             Discharge Summary  IDENTIFYING DATA:  This is a 35 year old single Caucasian female voluntarily admitted.  The patient presented reporting strong urges of wanting to be dead. The patient had relapsed on alcohol and had been abusing Xanax.  Prior to admission, she had been followed up by Dr. Katrinka Blazing.  This was her first psychiatric admission.  She had been drinking since the age of 79.  Had been treated for depression and panic disorder.  MEDICATIONS:  Celexa 20 mg q.d., Xanax 2 mg q.i.d. p.r.n., Depo-Provera every three months.  DRUG ALLERGIES:  CODEINE.  PHYSICAL EXAMINATION:  Essentially within normal limits.  Neurologically nonfocal.  LABORATORY DATA:  Routine admission labs are essentially within normal limits including a CBC with differential, comprehensive metabolic panel and thyroid profile.  Urine drug screen was negative.  Urinalysis negative.  Urine pregnancy negative.  MENTAL STATUS EXAMINATION:  Fully alert, healthy-appearing Caucasian female in no acute distress.  Affect was actually somewhat bright.  No tremulousness or anxiety noted.  The patient was cooperative and polite.  Speech was within normal limits.  Mood appeared to be fairly euthymic.  Thought processes goal directed.  Thought content negative for psychotic symptoms.  The patient denied suicidal or homicidal ideation and demonstrated insight into her current situation.  Cognition intact.  Impulse control, by history, was somewhat poor.  ADMISSION DIAGNOSES: Axis I:    1. Alcohol abuse.            2. Major depression not otherwise specified.            3. Panic disorder not otherwise  specified.            4. Benzodiazepine dependence. Axis II:   None. Axis III:  History of asthma. Axis IV:   Moderate (psychosocial stressors). Axis V:    34/74.  HOSPITAL COURSE:  The patient was admitted and ordered routine p.r.n. medications.  She was initially started on a phenobarbital.  Ordered Librium detox protocol, which was stopped after the patient showed no evidence of withdrawal and ordered Celexa for depressive symptoms.  Celexa was optimized as tolerated.  The patient was cooperative, participated in treatment and showed a response to stabilization.  CONDITION ON DISCHARGE:  Improved.  Mood euthymic.  Affect bright.  Thought process goal directed.  Thought content negative for dangerous ideation or psychotic symptoms.  The patient reported motivation to be abstinent and, at least intellectually and verbally, could describe the importance of maintaining sobriety.  FOLLOW-UP:  The patient was discharged in improved condition to follow up at the [facility] on October 21, 2001 at 9 a.m.  They were recommended to continue to monitor for prolonged benzodiazepine withdrawal due to the brief admission and a notation of avoiding all benzodiazepines was written on discharge sheet.  DISCHARGE MEDICATIONS: 1. Celexa 40 mg q.a.m. 2. Trazodone 50 mg q.h.s.  DISCHARGE DIAGNOSES: Axis I:    1. Alcohol abuse.            2. Major depression not otherwise specified.  3. Panic disorder not otherwise specified.            4. Benzodiazepine dependence. Axis II:   None. Axis III:  History of asthma. Axis IV:   Moderate (psychosocial stressors). Axis V:    Global Assessment of Functioning on discharge 55-60. Dictated by:   Jeanice Lim, M.D. Attending Physician:  Jeanice Lim DD:  12/04/01 TD:  12/04/01 Job: [id number]

## 2011-04-10 NOTE — Discharge Summary (Signed)
[name], [name]                        ACCOUNT NO.:  [id number]   MEDICAL RECORD NO.:  [id number]                   PATIENT TYPE:  IPS   LOCATION:  0508                                 FACILITY:  [facility]   PHYSICIAN:  Geoffery Lyons, M.D.                   DATE OF BIRTH:  07-Jun-1976   DATE OF ADMISSION:  07/01/2002  DATE OF DISCHARGE:  07/13/2002                                 DISCHARGE SUMMARY   CHIEF COMPLAINT AND PRESENT ILLNESS:  This is the second admission to [facility] for this white female in early 90s from [place],  readmitted to our unit after having been in the unit one month prior to this  admission.  History of severe anxiety and depression.  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  Supposed to be in long-term  treatment but did not happen.  Increasingly anxious and depressed, attempted  to get back on the Xanax but it was not prescribed.  Decompensated and  presented with anxiety, depression, irritability and suicidal ideation.   PAST PSYCHIATRIC HISTORY:  [facility] inpatient as well as  outpatient unit.  She was detoxified from benzodiazepines and alcohol one  month prior to this admission.   ALCOHOL/DRUG HISTORY:  As already stated.  Ongoing use of alcohol and  benzodiazepines.  Has used marijuana and has experimented with other drugs.   PAST MEDICAL HISTORY:  Noncontributory.   MEDICATIONS:  Lexapro 30 mg per day.   PHYSICAL EXAMINATION:  Performed and failed to show any acute findings.   MENTAL STATUS EXAM:  Anxious white female with good eye contact, dressed  casually, agitated but cooperative.  Speech was fast and pressured.  Mood  depressed and anxious, irritable when told she could not be started on any  benzodiazepines.  Positive suicidal ideation.  Able to contract for safety.  No homicidal ideation.  Thought processes are logical and goal directed.  Content: No predominant theme.  No evidence  of psychosis.  __________  disturbance.  Cognition well-preserved.   ADMISSION DIAGNOSES:   AXIS I:  1. Major depression, recurrent.  2. Anxiety disorder not otherwise specified.  3. Polysubstance dependence.   AXIS II:  No diagnosis.   AXIS III:  No diagnosis.   AXIS IV:  Moderate.   AXIS V:  Global Assessment of Functioning upon admission 35-40; highest  Global Assessment of Functioning in the last year 55-60.   LABORATORY DATA:  Within normal limits.   HOSPITAL COURSE:  She was admitted and started intensive individual and  group psychotherapy.  She was placed back on the Lexapro and she was kept on  trazodone and Vistaril as needed.  Lexapro was increased to 40 mg per day  and she was started on Depakote ER 500 mg at night.  She continued to  evidence persistent depressed mood.  She also evidenced some irritability.  She  was placed on Wellbutrin and she was placed on Topamax.  While on the  unit, she found out that she was pregnant.  She decided she was going to  keep the pregnancy.  She was kept on medications but while in the unit, she  had miscarriage.  After the miscarriage, she got very depressed due to the  loss of the baby as she had a previous abortion in the past.  She was able  to deal with this and eventually became stronger.  Denied any further  suicidal or homicidal ideation.  She was willing to go back with the family  and follow up at the [facility].  Upon discharge, mood improved.  Affect  brighter.  Having dealt with the substance abuse as well as the loss of the  pregnancy.  Having worked on relapsed prevention plan and committed to  following up at the [facility].   DISCHARGE DIAGNOSES:   AXIS I:  1. Polysubstance dependence.  2. Depressive disorder not otherwise specified.  3. Anxiety disorder not otherwise specified.   AXIS II:  No diagnosis.   AXIS III:  Status post miscarriage.   AXIS IV:  Moderate.   AXIS V:  Global Assessment of  Functioning upon discharge 55.   DISCHARGE MEDICATIONS:  1. Lexapro 20 mg twice a day.  2. Wellbutrin SR 100 mg in the morning.  3. Topamax 25 mg, 1 in the morning and 2 at night.  4. Trazodone 100 mg at bedtime for sleep.   FOLLOW UP:  [facility].                                               Geoffery Lyons, M.D.    IL/MEDQ  D:  08/16/2002  T:  08/17/2002  Job:  [id number]

## 2011-04-10 NOTE — H&P (Signed)
   NAMEDARIANA, [name]                        ACCOUNT NO.:  [id number]   MEDICAL RECORD NO.:  [id number]                   PATIENT TYPE:  OBV   LOCATION:  1826                                 FACILITY:  [facility]   PHYSICIAN:  Cindee Salt, M.D.                    DATE OF BIRTH:  [birth year]   DATE OF ADMISSION:  11/14/2002  DATE OF DISCHARGE:                                HISTORY & PHYSICAL   PREOPERATIVE DIAGNOSIS:  Saw injury right index finger.   POSTOPERATIVE DIAGNOSIS:  Saw injury right index finger.   HISTORY:  The patient is a 35 year old female right-hand dominant who  suffered an injury to her right index finger.  This was on a saw at work.  She is seen at the request of  Dr. Effie Shy.   PAST MEDICAL HISTORY:  Negative.   ALLERGIES:  None.   MEDICATIONS:  Takes multiple medicines including Zantac, Lexapro.   PHYSICAL EXAMINATION:  Reveals a laceration flap in nature approximately  base of the radial aspect of her index finger.  She complains of decreased  sensation distally.   X-rays reveal an injury into the phalanx.   PLAN:  Exploration repair artery, nerves, tendons as dictated by findings.  The patient  will be discharged to home following the surgery.                                               Cindee Salt, M.D.    GK/MEDQ  D:  11/14/2002  T:  11/15/2002  Job:  [id number]

## 2011-04-10 NOTE — Discharge Summary (Signed)
NAMEPRUDY, Debbie            ACCOUNT NO.:  [id number]   MEDICAL RECORD NO.:  [id number]          PATIENT TYPE:  IPS   LOCATION:  0303                          [facility]  [facility]   PHYSICIAN:  Jeanice Lim, M.D. DATE OF BIRTH:  [birth year]   DATE OF ADMISSION:  01/23/2005  DATE OF DISCHARGE:  01/29/2005                                 DISCHARGE SUMMARY   IDENTIFYING DATA:  This is a 35 year old single Caucasian female voluntarily  admitted.  Presenting with a history of substance abuse, using crack  cocaine, starting approximately a month ago, impulsive behaviors, no  previous history of cocaine use.  Last use was on Thursday prior to  admission.  Thoughts of hurting self, running into a tree with car.  Father  of the children is going to prison.  This is the third hospitalization at  [facility].  Has a history of trichotillomania.  History of bipolar.  Had an  overdose on Xanax in 2004.   MEDICATIONS:  None.  In the past, had been on Risperdal, Neurontin, Lexapro,  trazodone, Gabitril and Wellbutrin as well as Prozac and lithium.   ALLERGIES:  No known drug allergies.   PHYSICAL EXAMINATION:  Physical and neurologic exam within normal limits.   LABORATORY DATA:  Routine admission labs within normal limits.   MENTAL STATUS EXAM:  Alert, oriented.  Good eye contact.  Speech clear.  Mood anxious, restless. Affect anxious.  Thought processes obsessive  thinking.  No suicidal or homicidal ideation.  Cognitively intact.  Judgment  and insight were fair with a history of poor impulse control.   ADMISSION DIAGNOSES:   AXIS I:  1.  Bipolar disorder, type 1, mixed with psychotic features.  2.  Cocaine dependence.  3.  History of polysubstance abuse.  4.  History of trichotillomania.  5.  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   AXIS II:  Deferred.   AXIS III:  None.   AXIS IV:  Moderate to severe (stressors related to psychosocial issues and  sequelae of substance use, limited support system,  financial stress,  occupational problems).   AXIS V:  35/60.   HOSPITAL COURSE:  The patient was admitted and ordered routine p.r.n.  medications and underwent further monitoring.  Was encouraged to participate  in individual, group and milieu therapy.  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  Participating in therapy.  Pursued getting  into a halfway house, a residential program.  The patient described suicidal  thoughts of thinking of walking into traffic, feeling out of control with  crack for at least 1-2 months and previously had been powder.  Long history  of polysubstance abuse.  Apparently skilled at manipulating clinicians into  getting prescriptions for benzodiazepines and opiates as per patient.  Also  complaining of tooth pain, getting seen emergently by dentists but then  misses follow-up appointments including a recent root canal appointment.  Clear history of mood lability and bipolar symptoms in addition to  polysubstance abuse and dependency.  The patient gradually reported a  positive response to medication changes.   CONDITION ON DISCHARGE:  Discharged in improved condition.  Was going to be  able to return to work and have intensive substance abuse treatment as well  as psychiatric follow-up.  Reported being aware of the dangerousness of the  substance use and the impact on her mood and was to be compliant with the  medications, follow-up and abstinence and had intellectual insight at the  time of discharge.  The patient was given medication education regarding the  safety of medications including risk/benefit ratio and alternative treatment  regarding medications.   DISCHARGE MEDICATIONS:  1.  Symmetrel 100 mg b.i.d.  2.  Amoxicillin 500 mg, 1 b.i.d. through March 15th and then discontinue.  3.  Motrin 600 mg, 1 three times a day as needed for pain for 10  days.  4.  Neurontin 300 mg, 1 three times a day and 3 q.h.s.  5.  Depakote ER 250 mg, 1 b.i.d. and 2 q.h.s.  6.  Lexapro 10 mg, 1/2 q.a.m.  7.  Risperdal 1 mg, 1 q.h.s.  8.  Lithium 300 mg, 1 b.i.d.   FOLLOW UP:  The patient was to follow up with [facility] on  Monday, March 13th at 10 a.m.   DISCHARGE DIAGNOSES:   AXIS I:  1.  Bipolar disorder, type 1, mixed with psychotic features.  2.  Cocaine dependence.  3.  History of polysubstance abuse.  4.  History of trichotillomania.  5.  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   AXIS II:  Deferred.   AXIS III:  None.   AXIS IV:  Moderate to severe (stressors related to psychosocial issues and  sequelae of substance use, limited support system, financial stress,  occupational problems).   AXIS V:  Global Assessment of Functioning on discharge 55.      JEM/MEDQ  D:  03/07/2005  T:  03/07/2005  Job:  [id number]

## 2011-04-10 NOTE — Discharge Summary (Signed)
NAME:  [name], [name]                      ACCOUNT NO.:  [id number]   MEDICAL RECORD NO.:  [id number]                   PATIENT TYPE:  IPS   LOCATION:  0508                                 FACILITY:  [facility]   PHYSICIAN:  Geoffery Lyons, M.D.                   DATE OF BIRTH:  [birth year]   DATE OF ADMISSION:  09/04/2003  DATE OF DISCHARGE:  09/07/2003                                 DISCHARGE SUMMARY   CHIEF COMPLAINT AND PRESENT ILLNESS:  This was the fourth admission to [facility] for this 35 year old single white female  voluntarily admitted.  She had polysubstance dependence, mood disorder, not  otherwise specified.  She took an overdose of Xanax.  She had been crying,  upset, been a bad mother to her 16-year-old and 40-year-old children, tearful,  thoughts of suicide, had mood swings, wanted to go into rehabilitation.   PAST PSYCHIATRIC HISTORY:  This was the fourth time at [facility].  She was followed by Jasmine Pang, M.D., on an outpatient basis.   SUBSTANCE ABUSE HISTORY:  History of benzodiazepine abuse.   PAST MEDICAL HISTORY:  1. Syncope.  2. Headaches.   MEDICATIONS:  1. Xanax 2 mg one half to one four times a day, taking erratically.  2. Lexapro 10 mg per day.  3. Risperdal, dose unclear.  4. Concerta, dose unclear.   PHYSICAL EXAMINATION:  Physical examination was performed, failed to show  any acute findings.   MENTAL STATUS EXAM:  Mental status exam revealed a fully alert, labile,  tearful female, but clear and coherent, cooperative.  Speech: Normal.  Mood:  Depressed, anxious.  Affect: Labile, builds up, could become irritable and  angry.  Thought processes: Logical and coherent; no suicidal ideas, no  homicidal ideas, overwhelmed, upset, wanted to go to rehabilitation,  uncomfortable with the way she was feeling.  Cognitive: Cognition was well  preserved.   ADMISSION DIAGNOSES:   AXIS I:  1. Mood disorder, not  otherwise specified.  2. Polysubstance abuse.   AXIS II:  No diagnosis.   AXIS III:  1. Migraine headaches.  2. Syncope.   AXIS IV:  Moderate.   AXIS V:  Global assessment of functioning upon admission 42, highest global  assessment of functioning in the last year 60.   LABORATORY DATA:  Drug screen was positive for benzodiazepines.  Other  laboratory workup was within normal limits.   HOSPITAL COURSE:  She was admitted and started in intensive individual and  group psychotherapy.  She was maintained on Lexapro.  She was given some  Ambien for sleep.  She was given Seroquel.  She was weaned off the Xanax.  She was given lithium.  Issues had to do with where she was going from here.  She was wanting to go into a long-term treatment facility, treatment program  for a couple of years as she  felt that she could not maintain.  The family  was supportive of that.  Initially, she was very labile, irritable, angry,  easily built up when her needs were not addressed to her demands, very  entitled.  She did respond to limit setting, to support from staff.  She had  episodes of passing out but vital signs were stable.  She wanted Dilaudid.  On October 13, she even threatened to call 911 to be taken in an ambulance  to the emergency room to get Dilaudid.  But, she did respond to the  boundaries and the structure.  She came back and apologized.  She was much  less irritable.  On October 14, it was felt that she had obtained full  benefit from this particular stay.  Her mood was improved, better, her  affect was more broad, there were no major shifts in affect, aware that she  needed to stay abstinent and committed to doing that.  So, on October 15,  she was even better, motivated to pursue further outpatient treatment, so we  went ahead and discharged to possibly be admitted to the [facility]   DISCHARGE DIAGNOSES:   AXIS I:  1. Mood disorder, not otherwise specified.  2.  Polysubstance abuse.   AXIS II:  No diagnosis.   AXIS III:  1. Migraine headaches.  2. Syncope.   AXIS IV:  Moderate.   AXIS V:  Global assessment of functioning upon discharge 50.   DISCHARGE MEDICATIONS:  1. Lexapro 10 mg per day.  2. Lithium 300 mg twice a day and at night.  3. Seroquel 25 mg one to two every six hours as needed for anxiety.   FOLLOW UP:  She was going to an interview for [employer].  She was  follow up with Jasmine Pang, M.D.                                              Geoffery Lyons, M.D.   IL/MEDQ  D:  10/01/2003  T:  10/02/2003  Job:  [id number]

## 2011-04-10 NOTE — H&P (Signed)
[facility]  Patient:    [name]                      MRN: [id number] Adm. Date:  [id number] Attending:  Leonard Schwartz Dictator:   Erin Sons, C.N.M.                         History and Physical  HISTORY OF PRESENT ILLNESS:   [name] is a 35 year old gravida 3, para 1-0-1-1 at 40 weeks, who presents for labor augmentation secondary to prolonged prodromal labor with cervix now 4 cm.  She has been seen several times at [facility] over the last two weeks, with cervix persistently 2 cm. Pregnancy has been remarkable for:  #1 - History of bicornuate uterus, #2 - history of postpartum depression, #3 - history of manic-depressive disease, #4 - first trimester bleeding, #5 - history of asthma, #6 - smoker, #7 - codeine allergy with anaphylaxis.  PRENATAL LABORATORY DATA:     Blood type is O-positive.  Rh-antibody negative.  VDRL nonreactive.  Rubella titer positive.  Hepatitis B surface antigen negative. HIV nonreactive.  Rubella titer is immune.  GC and Chlamydia cultures were negative.  Pap was normal.  Glucose challenge was normal.  AFP was normal. Hemoglobin upon entry into practice was 13.4; it was 12 at 26 weeks.  Group B strep culture was negative at 36 weeks.  EDC was October 28, 1999 and was established by last menstrual period and was in agreement with ultrasound at approximately 7 weeks.  HISTORY OF PRESENT PREGNANCY:  Patient entered care at 10 weeks.  She had BV diagnosed at that time and was treated after 13 weeks.  She had some early cramping that resolved itself.  She had one syncopal episode at 23 weeks that was probably secondary to decreased nutritional status.  She smokes approximately one-fourth  pack per day through most of her pregnancy.  She had several episodes of prodromal and persistent prodromal labor over the last several weeks.  OBSTETRICAL HISTORY:           In 1998, patient had a vaginal birth of a female infant, weight 7 pounds 5 ounces, at 42-weeks gestation.  She was in labor over 24 hours.  She had epidural anesthesia.  She did have hyperemesis during that pregnancy with vomiting continuing throughout the entire time.  She did have postpartum depression that was treated with Prozac.  She has a bicornuate uterus. She had some preterm labor with that pregnancy.  In 1999, she had a therapeutic  termination of pregnancy at 4-weeks gestation.  MEDICAL HISTORY:              She was on Ortho Tri-Cyclen until approximately six months prior to pregnancy.  She did not like the way it made her feel.  She has  occasional yeast infections.  She reports the usual childhood illnesses.  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  She has a history of asthma and uses inhalers p.r.n.  She has a history of manic depressiveness and was on Prozac prior to pregnancy.  She was hospitalized during her pregnancy for hyperemesis.  SURGICAL HISTORY:             Tonsillectomy at age 81 or 18; wisdom teeth removal at age 59; she had an abortion in June of 1999.  ALLERGIES:  She is allergic to CODEINE, which causes anaphylactic shock.  SOCIAL HISTORY:               Patient is single.  She is engaged to the father f the baby; his name is [name].  Patient is Caucasian and of the [place] faith. She has been supported by her aunt and uncle, with whom she lives.  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  Her partner has a high school educate and is employed in [employer].  She has been followed by the certified nurse midwife service at [facility].  She denies any alcohol or drug use during this pregnancy.  She has smoked up to one-half pack per day, but now down to approximately one-third pack.  PHYSICAL EXAMINATION:  VITAL SIGNS:                  Vital signs are stable.  Patient is  afebrile.  HEENT:                        Within normal limits.  LUNGS:                        Bilateral breath sounds are clear.  HEART:                        Regular rate and rhythm without murmur.  BREASTS:                      Soft and nontender.  ABDOMEN:                      Fundal height is approximately 39 cm.  Estimated fetal weight is 8 to 8-1/2 pounds.  Uterine contractions are every 4 to 10 minutes, mild-to-moderate quality.  PELVIC:                       Cervical exam in the office was 4 cm, 100%; vertex at a -1 to a 0 station with slight bulging bag of water.  Fetal heart rate is reactive with no decelerations.  EXTREMITIES:                  Deep tendon reflexes are 2+ without clonus. There is a trace edema noted.  IMPRESSION:                   1. Intrauterine pregnancy at 40 weeks.                               2. Early labor.                               3. Persistent prodromal labor.                               4. Negative group B streptococcus.                               5. History of postpartum depression.  PLAN:  1. Admit to birthing suite per consult with                                  Janine Limbo, M.D. as attending physician.                               2. Routine certified nurse midwife orders.                               3. Anticipate artificial rupture of membranes with                                  patients partner arrives at the hospital.                               4. Anticipate normal spontaneous vaginal birth. DD:  10/28/99 TD:  10/28/99 Job: [id number]

## 2011-04-10 NOTE — Discharge Summary (Signed)
   NAME:  [name], ALKHATIB                      ACCOUNT NO.:  [id number]   MEDICAL RECORD NO.:  [id number]                   PATIENT TYPE:  INP   LOCATION:  0463                                 FACILITY:  [facility]   PHYSICIAN:  Corinna L. Lendell Caprice, MD             DATE OF BIRTH:  Jun 30, 1976   DATE OF ADMISSION:  08/21/2003  DATE OF DISCHARGE:  08/22/2003                                 DISCHARGE SUMMARY   DIAGNOSES:  1. Dizziness, suspect medication related versus psychogenic.  2. History of syncope, suspect medication related versus psychogenic.  3. Migraine headache.  4. History of polysubstance abuse.  5. Benzodiazepine dependence.  6. History of depression.   DISCHARGE MEDICATIONS:  1. Lexapro 20 mg p.o. daily.  2. Phenergan as needed.  3. I have recommended that she taper down her Xanax and Risperdal under the     guidance of Jasmine Pang, M.D., her psychiatrist.   HISTORY AND HOSPITAL COURSE:  The patient is a 35 year old white female with  a history of bipolar disorder and polysubstance abuse who presented to the  emergency room with complaints of persistent migraine headache for two days.  She also had complaints of presyncope and weakness.  She has had a recent  workup for syncope which included negative CT, MRI, LP, and EEG.  The  patient was admitted, started on DHE, steroids, pain medications, and  antiemetics.  She continued to complain of pain although she was noted to be  talking on the phone repeatedly and did not appear to be in distress.  She  was also caught smoking in her room. She also complained of vomiting and  nausea although the nurses reported that she ate all of her meals and  complained of being hungry all the time.  They also did not note any emesis.  Given all her normal workup and lack of objective evidence of pain, I  suspect she may  have some element of drug-seeking, given her past history.  At the time of  discharge, she was ambulating and  eating despite her complaints otherwise.  She is therefore being discharged to home in stable condition with  instructions to follow up with her psychiatrist, Jasmine Pang, M.D.                                               Corinna L. Lendell Caprice, MD    CLS/MEDQ  D:  08/22/2003  T:  08/22/2003  Job:  [id number]   cc:   Jasmine Pang, M.D.  Fax: [phone]   Meredith Staggers, M.D.  [address], [address]  [place]  [place] [address]  Fax: [phone]

## 2011-04-10 NOTE — Discharge Summary (Signed)
[name], [name]            ACCOUNT NO.:  [id number]   MEDICAL RECORD NO.:  [id number]          PATIENT TYPE:  IPS   LOCATION:  0306                          [facility]ACILITY:  [facility]   PHYSICIAN:  Anselm Jungling, MD  DATE OF BIRTH:  July 06, 1976   DATE OF ADMISSION:  04/05/2007  DATE OF DISCHARGE:  04/08/2007                               DISCHARGE SUMMARY   IDENTIFYING DATA/REASON FOR ADMISSION:  This was an inpatient  psychiatric admission for this patient, a 35 year old single white  female who presented for help with heroin addiction.  She also reported  previous dependence on cocaine but denied alcohol abuse.  She stated I  need something more, my skin is crawling and my nose is running.  The  patient's last heroin ingestion was the day prior to admission.  Please  refer to the admission note for further details pertaining to the  symptoms, circumstances, and history that led to her hospitalization.  She was given an initial Axis I diagnosis of opiate dependence.  In  addition, she was given an initial diagnosis of bipolar disorder by  history.   MEDICAL AND LABORATORY:  The patient was medically and physically  assessed by the psychiatric nurse practitioner.  There were no  significant medical issues.   HOSPITAL COURSE:  The patient was admitted to the adult inpatient  psychiatric service.  She presented as a well-nourished, well-developed  female who was alert and fully oriented.  Her thoughts and speech were  normally organized, without any signs or symptoms of psychosis or  thought disorder.  She tells me, in the initial interview, that she has  a history of bipolar per Dr. Katrinka Blazing, a psychiatrist.  She reported that  she had previously done well on a combination of lithium and Lexapro.  She denied suicidal ideation.  She verbalized a strong desire for help.   The patient was placed on a clonidine detoxification protocol to address  opiate withdrawal symptoms and was  restarted on lithium and Lexapro.  We  suggested a family meeting but the patient declined this.   By the third hospital day, the patient stated that she felt better than  yesterday.  She was pleasant and well organized.   The patient was given a brief trial of Seroquel in low doses but she  reported feeling confused and disoriented with this and subsequently it  was discontinued.   On the fourth hospital day, the patient was complaining of minor  myalgias but this was relieved by p.r.n. medication.  In the meantime,  she had helped to make arrangements for a halfway house for herself and  indicated that her 12-step sponsor would be coming to pick her up.   On the fourth hospital day, the patient appeared to have completed the  opiate detoxification process.  She was tolerating medications and with  a brief retrial of Seroquel had found it very helpful at doses of 25 mg  t.i.d.  The patient agreed to the following aftercare plan.   AFTERCARE:  The patient was to follow up at the [facility] for  comprehensive services on  04/12/07.   DISCHARGE MEDICATIONS:  Lexapro 10 mg daily, lithium carbonate 300 mg  b.i.d., and Seroquel 25 mg t.i.d.   DISCHARGE DIAGNOSES:  AXIS I:  Opiate dependence, early remission, and  history of bipolar disorder, not otherwise specified.  AXIS II:  Deferred.  AXIS III:  No acute or chronic illnesses.  AXIS IV:  Stressors severe.  AXIS V:  Global Assessment of Functioning (GAF) on discharge 60.      Anselm Jungling, MD  Electronically Signed     SPB/MEDQ  D:  04/19/2007  T:  04/19/2007  Job:  [id number]

## 2011-04-10 NOTE — Op Note (Signed)
[name], [name]                        ACCOUNT NO.:  [id number]   MEDICAL RECORD NO.:  [id number]                   PATIENT TYPE:  AMB   LOCATION:  MATC                                 FACILITY:  [facility]   PHYSICIAN:  Hal Morales, M.D.             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   OPERATION:  Suction dilatation and evacuation.   SURGEON:  Hal Morales, M.D.   ANESTHESIA:  Monitored anesthesia care and local.   ESTIMATED BLOOD LOSS:  Less than 50 cc.   COMPLICATIONS:  None.   FINDINGS:  A small amount of products of conception were obtained at the  time of D&E.   DESCRIPTION OF PROCEDURE:  The patient had undergone two quantitative HCG  levels with a decrease over a three day period.  The patient was having mild  to moderate cramping and a small amount of bleeding.  A discussion was held  with the patient concerning the indication that her pregnancy was moving in  the direction of spontaneous abortion and D&E was recommended.  The risks of  anesthesia, bleeding, infection, damage to adjacent organs, specifically  uterine perforation were all discussed.  She wished to proceed with D&E.  She was taken to the operating room after appropriate identification, placed  on the operating table in the lithotomy position.  After placement of  equipment for monitored anesthesia care, the perineum and vagina were  prepped with multiple layers of Betadine and draped as a sterile field.  A  red Robinson catheter was used to empty the bladder.  A Graves speculum was  placed in the vagina and a single-tooth tenaculum placed on the anterior  cervix.  A paracervical block was achieved with a total of 10 cc of 2%  Xylocaine.  The  cervix was dilated to accommodate a #7 suction curette, and  this was used to evacuate all quadrants of the uterus.  A sharp curette was  used to ensure that all products of conception had been removed.  Hemostasis  was noted to be adequate.  All instruments were removed from the vagina and  the patient taken from the operating room to the recovery room in  satisfactory condition having tolerated the procedure well with sponge and  instrument counts correct.   DISCHARGE INSTRUCTIONS:  Discharge instructions on D&E from the [facility].   DISCHARGE MEDICATIONS:  1. Ibuprofen 800 mg p.o. q.6h. p.r.n. pain.  2. Doxycycline 100 mg p.o. b.i.d. x 7 days.    FOLLOW UP:  The patient is to call for a two week appointment.  If she is  still hospitalized, that appointment will need to be done NMAU.  Blood type  is O positive.  The patient will be notified of the pathology report as soon  as that is available.  Most probably it will be July 11, 2002.                                                Hal Morales, M.D.    VPH/MEDQ  D:  07/07/2002  T:  07/08/2002  Job:  [id number]

## 2011-04-10 NOTE — Discharge Summary (Signed)
NAMEELMER, [name]                        ACCOUNT NO.:  [id number]   MEDICAL RECORD NO.:  [id number]                   PATIENT TYPE:  IPS   LOCATION:  0506                                 [facility]ILITY[facility]  [facility]   PHYSICIAN:  Geoffery Lyons, MD                     DATE OF BIRTH:  11/20/1976   DATE OF ADMISSION:  06/13/2002  DATE OF DISCHARGE:  06/20/2002                                 DISCHARGE SUMMARY   CHIEF COMPLAINT AND PRESENT ILLNESS:  This was the second admission to [name] for this 35 year old white female voluntarily  admitted.  History of alcohol abuse, drinking one fifth of liquor about 2-3  days per week.  Drinking since age of 52.  Says that she wants to be detoxed  for her children.  She puts her children to bed and she was out all night  drinking.  The patient reported that she wants a 28-day program until she is  discharged.  Sleeping has been fair.  She has an increased appetite and  denies any psychosis.  She wants to remain on her Xanax when she is  discharged.   PAST PSYCHIATRIC HISTORY:  [name] on an outpatient basis.  History of  detox in November of 2002.  Longest time sobriety six months.   ALCOHOL/DRUG HISTORY:  Been drinking a fifth of liquor 2-3 times per week.  Never drinks at work.  Drinking since age 36.  Last drink was Monday.  History of blackouts but no seizures.  Close to the admission, she used  crack cocaine for the first time.   PAST MEDICAL HISTORY:  Noncontributory.   MEDICATIONS:  Lexapro 20 mg, Xanax 1 mg.   PHYSICAL EXAMINATION:  Performed and failed to show any acute findings.   MENTAL STATUS EXAM:  Alert, short-statured, casually dressed female with  good eye contact.  Cooperative.  Speech is clear.  Mood is depressed.  Affect is flat.  Thought processes are coherent.  No evidence of psychosis.  No auditory or visual hallucinations.  Cognition well-preserved.   ADMISSION DIAGNOSES:   AXIS I:  1. Alcohol  dependence.  2. Depressive disorder not otherwise specified.   AXIS II:  No diagnosis.   AXIS III:  No diagnosis.   AXIS IV:  Moderate.   AXIS V:  Global Assessment of Functioning upon admission 35; highest Global  Assessment of Functioning in the last year 60.   HOSPITAL COURSE:  She was admitted and started intensive individual and  group psychotherapy.  She was detoxified using phenobarbital.  Due to  __________ and addictions, we detoxed her from Xanax too.  She had a hard  time letting go of the Xanax but eventually she realized that she was not  going to be placed on this medication.  We worked with Seroquel as well as  Neurontin and Vistaril to help her with her  anxiety.  She had a hard time  coping, easily overwhelmed.  Was wanting to go into a rehab program.  Family  was __________ [name].  Continued to express feelings of anxiety  and agitation but understood that she would have to abstain from Xanax.  By  June 20, 2002, she had obtained full benefit from this stay.  She was fully  detoxed.  No suicidal ideation.  No homicidal ideation.  She is willing to  pursue rehab.  She will be discharged to [name] for further treatment.   DISCHARGE DIAGNOSES:   AXIS I:  1. Anxiety disorder not otherwise specified.  2. Alcohol dependence.  3. Depressive disorder not otherwise specified.   AXIS II:  No diagnosis.   AXIS III:  No diagnosis.   AXIS IV:  Moderate.   AXIS V:  Global Assessment of Functioning upon discharge 55.   DISCHARGE MEDICATIONS:  1. Lexapro 30 mg per day.  2. Gabitril 2 mg three times a day.  3. Seroquel 100 mg twice a day and 200 mg at bedtime.   FOLLOW UP:  [name].                                               Geoffery Lyons, MD    IL/MEDQ  D:  07/19/2002  T:  07/22/2002  Job:  [id number]

## 2011-04-10 NOTE — H&P (Signed)
NAME:  [name], [name]                      ACCOUNT NO.:  [id number]   MEDICAL RECORD NO.:  [id number]                   PATIENT TYPE:  INP   LOCATION:  0102                                 FACILITY:  [facility]   PHYSICIAN:  Sherin Quarry, MD                   DATE OF BIRTH:  May 29, 1976   DATE OF ADMISSION:  08/15/2003  DATE OF DISCHARGE:                                HISTORY & PHYSICAL   ADMITTING HISTORY AND PHYSICAL:  [name] is a 35 year old lady who  states that she has a life long history of manic depressive disorder and has  had three syncopal episodes in the last week.  By coincidence, I actually  witnessed the first of these episodes which occurred in the [facility].  This episode was characterized by sinking to the ground and closing the  eyes, after which time the patient resisted efforts to arouse her.  When she  was ultimately aroused, she reported feeling tired.  There was no seizure  activity, no loss of bowel or bladder control during this episode.  Apparently the second episode occurred when she was standing at the top of a  flight of stairs.  She states that she passed out and slid down the stairs  fracturing her toe.  The third episode, which was today, apparently occurred  when she was brushing her teeth.  During this she passed out and fell to the  floor.  The first episode resulted in a visit to the emergency room.  At  that time a CT scan of the brain was performed which was normal.  The second  episode again resulted in a visit to the emergency room.  At that time she  had a spinal fluid examination which was also completely normal with normal  protein, glucose and cell count.  Today, she returns to the emergency room  as noted.  She indicates that she has a long-standing history of migraine  headaches and about once or twice a month will come to the [facility] or the [facility] with headache and will receive an injection of  Demerol and Phenergan.  She also takes Zomig sometimes for headaches.  The  headaches are described as a throbbing sensation on the top of her head  associated with nausea and vomiting.  [name] has numerous admissions to  [facility].  These include an admission in August of last year for  alcohol and benzodiazepine detoxification.  She also has a ling-standing  history of alcohol abuse, sometimes drinking as much as a quart of alcohol  per day.  She indicates that she has not been drinking any alcohol in the  last six months.   PAST MEDICAL HISTORY:   CURRENT MEDICATIONS:  The patient indicates that her current medications  are:  1. Xanax 2 mg q.i.d.  2. Risperdal 10 mg at bedtime.  3. Lexapro 20  mg daily.  4. Concerta and Zomig p.r.n. for headache.   She denies any history of significant medical illnesses.   FAMILY HISTORY:  Noncontributory.   SOCIAL HISTORY:  The patient lives with her aunt and uncle.  She has two  children age 79 and 3.  She smokes about one half pack of cigarettes per day.   REVIEW OF SYSTEMS:  HEAD:  The patient describes a persistent throbbing  headache.  EYES:  She indicates that her vision is blurry.  Her eyes are  sensitive to the light.  EARS/NOSE AND THROAT:  Otherwise normal.  CHEST:  She denies coughing, wheezing, or chest congestion.  CARDIOVASCULAR:  Denies  orthopnea, PND or ankle edema.  GI:  Denies nausea or vomiting at this time.  There has been some mild problems with epigastric discomfort.  There has  been no bowel or bladder problems recently.  Her last menstrual period was  about three weeks ago.  There have been no problems with excessive bleeding  or discharge.  NEURO:  The patient reports that two years ago she had a  seizure.  Apparently at that time she was hospitalized at [facility]  in [place].  The details of this are unclear.  Apparently no tests were done.   PHYSICAL EXAMINATION:  HEENT:  Within normal limits.   CHEST:  Clear.  BACK:  Reveals no CVA or point tenderness.  CARDIOVASCULAR:  Reveals normal S1, S2.  There are no rubs, murmurs or  gallops.  ABDOMEN:  Benign.  There are normal bowel sounds without masses, tenderness  or organomegaly.  NEUROLOGIC:  Testing is within normal limits including cranial nerves,  motor, sensory and cerebellar testing.  EXTREMITIES:  Within normal limits.   IMPRESSION:  1. 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  2. Manic depressive syndrome.  3. History of drug and alcohol abuse.  4. Chronic migraine headaches.   PLAN:  As noted the patient has already had a CT scan of the brain and has  also had a very thorough spinal fluid examination.  These studies do not, in  my opinion, need to be repeated.  We will monitor her on telemetry and also  obtain a neurology consultation to see whether EEG study would be in order.  An MRI scan of the brain will be obtained as well.  We will plan to continue  her usual medications, none of these have been changed recently according to  the patient.  A psychiatric evaluation may be useful although, this has been  a very long-standing problem and she has seen multiple psychiatrists in the  past.                                               Sherin Quarry, MD    SY/MEDQ  D:  08/15/2003  T:  08/15/2003  Job:  [id number]   cc:   Tiburcio Pea, Dr.  Deboraha Sprang at [facility]   Jasmine Pang, M.D.  [address]  Vella Raring  [place]  [place] [address]  Fax: [phone]

## 2011-04-10 NOTE — H&P (Signed)
[facility]  Patient:    [name], [name] Visit Number: [id number] MRN: [id number]          Service Type: PSY Location: 500 [id number] [id number] Attending Physician:  Jeanice Lim Dictated by:   Candi Leash. Orsini, N.P. Admit Date:  06/13/2002                     Psychiatric Admission Assessment  IDENTIFYING INFORMATION:  A 35 year old single white female voluntarily admitted on June 13, 2002.  HISTORY OF PRESENT ILLNESS:  The patient presents with a history of alcohol abuse, drinking approximately one fifth of liquor about 2-3 days per week. The patient has been drinking since the age of 50.  The patient states that she wants to be detoxed for her children.  She states when she puts her children to bed, then she goes out all night drinking.  The patient reports that she wants a 20 day program after she is discharged.  Her sleep has been fair.  She has an increased appetite.  She denies any psychosis.  She denies any withdrawal symptoms.  She states she wants to remain on her Xanax while she is discharged.  The patient is denying any withdrawal symptoms at present.  PAST PSYCHIATRIC HISTORY:  Sees Dr. Milford Cage as an outpatient, has a history of detox in November 2002.  Her longest history of sobriety has been 6 months.  That was 2 years ago.  The patient has a history of overdosing on Advil at the age of 44.  SOCIAL HISTORY:   She is a 35 year old single white female, 2 children ages 4-1/2 and 2-1/2.  Her children are with her grandmother and aunt.  She lives with her uncle.  She is a Consulting civil engineer at [employer].  She is studying cosmetology.  She has a DUI with a court date pending on August 4.  She states that was her first.  FAMILY HISTORY:  Mother with substance abuse.  ALCOHOL DRUG HISTORY:  The patient smokes, has been drinking a fifth of liquod 2-3 times per week.  She never drinks at work.  She has been drinking sinc e82 years of age.  Her last drink  was on Monday.  History of blackouts but no seizures.  The patient states that she on Monday used crack cocaine for the first time.  PAST MEDICAL HISTORY:  Primary care Seena Ritacco is Dr. Dayton Scrape at [facility].  Medical problems are none.  MEDICATIONS:  Lexapro 20 mg for 2 months, Xanax 1 mg, has been on that medication for about a year.  DRUG ALLERGIES:  No known allergies.  PHYSICAL EXAMINATION:  Performed by the medical student, the patient without any significant findings.  The patient appears as a well-nourished, healthy female without current complaints.  Her laboratory data:  CMET within normal limits.  CBC within normal limits.  MENTAL STATUS EXAMINATION:  She is an alert, short stature, casually dressed female with good eye contact, cooperative.  Speech is clear, mood is depressed, affect is flat, thought processes are coherent.  No evidence of psychosis, no suicidal or homicidal ideations.  Cognitive function intact. Memory is good, judgment is fair, insight is fair.  Poor impulse control.  ADMISSION DIAGNOSES: Axis I:    1. Alcohol abuse, rule out dependence.            2. 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 Axis II:   Deferred. Axis III:  None. Axis IV:   Other psychosocial problems.  Axis V:    Current is 35, this past year 30.  PLAN:  Voluntary admission for alcohol abuse, rule out dependence, and depressive symptoms.  Contract for safety, check every 15 minutes.  Will obtain labs, will continue her Lexapro.  The patient to remain alcohol free. Will initiate the Librium protocol.  Our plan is to increase the patients coping skills.  To attend AA meetings after discharge.  TENTATIVE LENGTH OF CARE:  3-4 days. Dictated by:   Candi Leash. Orsini, N.P. Attending Physician:  Jeanice Lim DD:  06/15/02 TD:  06/18/02 Job: [id number]

## 2011-04-10 NOTE — Consult Note (Signed)
NAME:  Debbie, Edwards                      ACCOUNT NO.:  [id number]   MEDICAL RECORD NO.:  [id number]                   PATIENT TYPE:  INP   LOCATION:  0356                                 FACILITY:  [facility]   PHYSICIAN:  Melvyn Novas, M.D.               DATE OF BIRTH:  Apr 21, 1976   DATE OF CONSULTATION:  08/15/2003  DATE OF DISCHARGE:                                   CONSULTATION   REASON FOR CONSULTATION:  [name] is a 35 year old Caucasian right-  handed female who states that she fell at home last Wednesday and at this  time broke three of her toes on her left foot.  She states that she has had  fainting spells, a single seizure, and migraines with hemiplegia in the  past; all this cannot be verified by her records here.  She has a history of  migraine and syncope for over seven years.  The patient states that she has  not been able to use her left leg right since her fall due to pain, but also  because she has a rubbery feeling on the sides and feels that her adduction  is weaker and that her hip does not move right.   Her next complaint is a cluster of headaches that have occurred over the  last week and during the day when her children are in the nursery she feels  better than when she has to watch them and the noise the children cause  seems to be making the headaches come on in the early afternoon hours and  worsening throughout the night.  She has not woken up from headaches, but  with headaches, and has had no tearfulness or runny nose or hemicranial pain  with those.  She does state she has a lot of nausea and photophobia with her  migraines and she states that she is also very sensitive to noises.  Fainting has occurred after headaches became extremely bad and she has been  using shots in the emergency room to take care of those.  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   PAST MEDICAL HISTORY:  The patient has a manic depression.  She had  __________ as a behavior manifestation, ADD and ADHD were also diagnosed,  and a history of psychotic depression.   SOCIAL HISTORY:  The patient is a single mother of two.  She is supported by  her parents, but lives with aunt and uncle.  She has never been married to  the father's of her children and has currently a steady boyfriend.  She  denies alcohol use, but smokes cigarettes daily, a pack a day.   FAMILY HISTORY:  ADHD and bipolar disease.  Her son who is 60 years old is  already diagnosed with ADHD.   CURRENT MEDICATIONS:  1. Concerta.  2. Lexapro.  3. Zomig.  4. Risperdal.  5. Xanax.   ALLERGIES:  No allergies to medicine.   PHYSICAL EXAMINATION:  VITAL SIGNS:  Stable with blood pressure 115/65.  She  is afebrile.  LUNGS:  Clear to auscultation.  CARDIORESPIRATORY:  Regular rate and rhythm.  No bruit.  No murmur.  No  edema.  MENTAL STATUS:  The patient is alert, fluent speech, no aphasia, no apraxia.  She appears sleepy and sedated due to medication, but she is fully oriented  and capable of giving medical history and complying with the examination.  NEUROLOGIC:  Cranial nerves show pupils that are equal to light and  accomodation.  Full extraocular movements.  She seems to have slightly more  ptosis on the left than on the right and also complains of a crossed eye,  her left eye shows exotropia and she is frequently complaining about  diplopia with gaze towards the extreme left.  This she says occurs when she  has her pain medication given IV and wears off as the medication wears off.  Tongue and uvula are midline.  There is no other facial asymmetry and no  sensory loss.  There is also no neck rigor.  The patient complains of neck  stiffness, but there is no Kernig or Brudzinski signs.  MOTOR:   Exam shows finger-nose dysmetria bilaterally, it is mild ataxia as  well as truncal ataxia.  Her grip however is equal.  She has 4-5/5 strength  in the upper extremities.  In the lower extremities she complains of a left-  sided weakness, a rubbery feeling in the thigh and hips.  Her adduction here  seems to be functionally impaired, abduction as well, and she shows failure  of relaxation when I try to elicit her deep tendon reflexes.  She has  downgoing toes to plantar stimulation, and she does not assist with plantar  flexion, dorsi flexion in the left foot due to pain with the fracture of the  toes.  The sensory loss again is bilaterally in the anterior thigh and the  patient has no matching deep tendon reflex loss.   LABORATORY DATA:  Shows that the patient has a UTI, but no CSF abnormalities  were found after Alinda Money, RN tapped her in the emergency room.  She had 3 white  blood cells, no reds, and normal protein and glucose levels.   ASSESSMENT:  1. 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  2. The broken toes on the left foot do limit the patient's compliance with     exercises.  It might help to use a boot cast or anything else for toe     protection while she is undergoing rehab and physical therapy which is     also ordered.  3. As for her migraine headaches she might try a DHE protocol since she is     hospitalized, q.8h. 0.5 mg DHE in association with her Reglan or     Phenergan and low dose steroids.  4. Syncope.  The patient has a history of syncope, it is psychogenic versus     medication induced versus drug induced.  5. History of single seizures that I cannot verify by her chart, and given     the patient's history of manic depressive disorder, might very well     psychogenic  in nature.  She describes a multiple minute spell of     generalized tonic-clonic convulsions and a prompt return to normal    baseline once the seizures were over.   PLAN:   1. Lumbar spine CT or MRI to evaluate if there is a true nerve damage     possible.  Guillain-Barre syndrome is not in my differential due to     protected deep tendon reflexes and the CSF results.  2. Rehab and physical therapy consultation.  3. EEG.  4. I would offer Nicotrol patches.  5. DHE protocol.   I thank Dr. Sherin Quarry for the consultation, but feel that this is more  psychiatry than a neurology impairing this patient's functional level.  Physical therapy and rehab however will help to pay attention to her  peripheral neurologic needs and I do suspect that there is a functional  component to those.                                               Melvyn Novas, M.D.    CD/MEDQ  D:  08/16/2003  T:  08/16/2003  Job:  [id number]

## 2011-04-10 NOTE — Discharge Summary (Signed)
NAME:  [name]                      ACCOUNT NO.:  [id number]   MEDICAL RECORD NO.:  [id number]                   PATIENT TYPE:  INP   LOCATION:  0356                                 FACILITY:  [facility]   PHYSICIAN:  Sherin Quarry, MD                   DATE OF BIRTH:  [birth year]   DATE OF ADMISSION:  08/15/2003  DATE OF DISCHARGE:  08/17/2003                                 DISCHARGE SUMMARY   HISTORY:  [name] is a 35 year old lady with a long-standing  history of manic depressive disorder and personality disorder who has had a  total of three syncopal episodes in the last week.  Coincidentally, I  actually observed one of these episodes while I was in the walk-in clinic  last week.  The other episodes occurred on one occasion when she was walking  down a flight of stairs, and on another occasion when she was brushing her  teeth.  During these episodes, there was no loss of bowel or bladder control  and no witnessed seizure activity.  The patient had a CT scan of the brain  done when she first came to the emergency room after a syncopal episode.  The second time she had a spinal fluid examination which was absolutely  normal.  When she came in a third time after a syncopal episode it was felt  desirable to admit her to the hospital for evaluation.   ADMISSION PHYSICAL EXAMINATION:  HEENT:  Within normal limits.  CHEST:  Clear.  BACK:  No CVA or point tenderness.  CARDIOVASCULAR:  Normal S1 and S2.  There was no murmurs, rubs, or gallops.  ABDOMEN:  Within normal limits.  NEUROLOGIC:  Including cranial nerves, motor, sensory, and cerebellar  testing was normal.  Station and gait was normal.  EXTREMITIES:  Normal.   HOSPITAL COURSE:  The patient was admitted for telemetry monitoring.  Subsequently, all spinal fluid tests came back negative.  Other studies  obtained include a TSH which was within normal limits.  A CNP revealed a  potassium of 3.9, glucose of 86;  the liver profile was normal.  Complete  blood count revealed a white count of 8900, hemoglobin was 13.1.  Urine drug  screen was positive for benzodiazepines.  Radiologic studies obtained  included a MRI scan of the spine and MRI of the brain, both of which were  reported as being within normal limits.   Consultation was obtained from Dr. Vickey Huger of the neurology service.  Dr.  Vickey Huger recommended that the patient had a lumbar spine MRI which was  described above.  She was also recommended that an EEG be performed.  This  was done on August 16, 2003.  The results of this study are currently  pending.   During the patient's hospitalization she expressed a lot of anger and  frustration towards hospital personnel chiefly because of the reluctance to  allow her to smoke.  On August 17, 2003, she indicated that it was her  plan to sign out of the hospital against medical advice.   DISCHARGE DIAGNOSES:  1. 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  2. Manic depressive syndrome.  3. History of drug and alcohol abuse.  4. Migraine headaches.   DISCHARGE MEDICATIONS:  1. Xanax 2 mg q.i.d.  2. Risperdal 10 mg at bedtime.  3. Lexapro 20 mg daily.  4. Concerta at bedtime daily.  5. Zomig p.r.n. for headache.   FOLLOW UP:  Presumably, the patient will continue to follow up with Dr.  Michaelle Copas psychiatric service and with [facility].   CONDITION ON DISCHARGE:  Good.                                               Sherin Quarry, MD    SY/MEDQ  D:  08/17/2003  T:  08/17/2003  Job:  [id number]   cc:   Dr. Tiburcio Pea  [facility]   Christene Slates. Katrinka Blazing, M.D.  [address]  [place]  [place] [address]  Fax: [phone]

## 2011-04-10 NOTE — H&P (Signed)
NAME:  [name], [name]              ACCOUNT NO.:  [id number]   MEDICAL RECORD NO.:  [id number]                   PATIENT TYPE:  IPS   LOCATION:  0508                                 FACILITY:  [facility]   PHYSICIAN:  Milford Cage, MD                   DATE OF BIRTH:  [birth year]   DATE OF ADMISSION:  07/01/2002  DATE OF DISCHARGE:                         PSYCHIATRIC ADMISSION ASSESSMENT   PATIENT IDENTIFICATION:  This is a Caucasian female in her early 63s from  [place] who was readmitted to our unit after having been here  approximately one month ago.   HISTORY OF PRESENT ILLNESS:  The patient has a history of severe anxiety and  depression.  She was recently detoxified due to alcohol and benzodiazepine  dependence.  She was supposed to go to long-term treatment for chemical  dependence but this did not happen.  She is feeling increasingly anxious and  depressed.  She has attempted to get back on the Xanax but I would no  prescribe any as an outpatient.  The patient has decompensated and presents  with severe anxiety, depression, anger, irritability, and suicidal ideation.  She needed to be hospitalized for her safety.   PAST PSYCHIATRIC HISTORY:  The patient has been treated by [facility].  As indicated above, she was on  Xanax for anxiety.  This was helpful but she continued to have a pattern of  abusing it episodically.  She was detoxified from benzodiazepines and  alcohol while in the hospital one month ago.  She was also on Lexapro 30 mg  q.d.   SUBSTANCE ABUSE HISTORY:  Positive alcohol, marijuana, benzodiazepine  abuse/dependence.  She has also experimented with other drugs.   PAST MEDICAL HISTORY:  Medical problems: No medical problems.   MEDICATIONS:  Lexapro 30 mg q.d.   DRUG ALLERGIES:  No known drug allergies   SOCIAL HISTORY:  The patient lives with her aunt and uncle who raised her.  She has two children.  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   FAMILY HISTORY:  Depression in some family members.   MENTAL STATUS EXAM:  The patient presented as an anxious Caucasian female  with good eye contact.  She was dressed casually.  She was agitated but  cooperative.  Speech was fast and pressured.  Mood: Depressed, anxious.  She  was irritable at times, especially when told she could not be restarted on  any benzodiazepine.  She had positive suicidal ideation but was able to  contract at this point for safety.  She had no homicidal ideation.  Thought  processes: Logical and goal directed.  Thought content: No predominant  theme.  There was no evidence  of psychosis or perceptual disturbance.  Cognitive: Alert and oriented x 4.  Short-term and long-term memory:  Adequate.  General fund of knowledge: Age and education level appropriate.  Attention and concentration: Diminished.  Insight: Poor.  Judgment: Poor.   ADMISSION DIAGNOSES:  Axis I:  1. Major depression, recurrent, severe without psychosis.  2. Anxiety disorder, not otherwise specified.  3. Polysubstance dependence.   Axis II:    Deferred.   Axis III:   Healthy.   Axis IV:    Moderate to severe (problems with primary support group,  problems related to social environment, educational problems, occupational  problems, housing problems, economic problems).   Axis V:     Current global assessment of functioning 10, highest past year  65.   INITIAL PLAN OF CARE:  Will increase Lexapro to 40 mg daily.  Will also  begin Depakote to use an adjunct to the Lexapro to enhance antidepressant  effect and provide mood stabilization.  Will use Vistaril p.r.n. through the  day for anxiety.  The patient will be involved in unit therapeutic groups  and activities with a focus on the chemical dependence track.   ESTIMATED LENGTH OF STAY:  Three to five days.   CONDITIONS NECESSARY FOR DISCHARGE:   Not suicidal.   POST HOSPITAL CARE PLAN:  It is recommended that the patient go to long-term  treatment for chemical dependence if at all possible given her difficulty  since detoxification.                                               Milford Cage, MD    BS/MEDQ  D:  07/04/2002  T:  07/10/2002  Job:  [id number]

## 2011-04-10 NOTE — Discharge Summary (Signed)
NAME:  [name], [name]             ACCOUNT NO.:  [id number]   MEDICAL RECORD NO.:  [id number]         PATIENT TYPE:  BIPS   LOCATION:                                FACILITY:  [facility]   PHYSICIAN:  Jeanice Lim, M.D. DATE OF BIRTH:  [birth year]   DATE OF ADMISSION:  03/26/2005  DATE OF DISCHARGE:  03/30/2005                                 DISCHARGE SUMMARY   IDENTIFYING DATA:  This is a 35 year old single Caucasian female voluntarily  admitted.  Relapsed on cocaine.  Relapsed a couple days, coming off of  lithium, on Lexapro three weeks ago but she could do without it.  Was having  suicidal thoughts of overdosing.  Endorsed mood lability, agitation and  unable to resist urge to use cocaine and consequently feeling even more  suicidal.   ALLERGIES:  No known drug allergies.   MEDICATIONS:  Imitrex and Effexor.   PHYSICAL EXAMINATION:  Physical and neurologic exam performed at [facility]  and essentially within normal limits.   MENTAL STATUS EXAM:  Fully alert, pleasant, cooperative, bright affect.  Some irritability.  Speech within normal limits.  Mood irritable at times,  depressed.  Thought processes goal directed.  Positive suicidal ideation  with plan to overdose and cognitively intact.  Judgment and insight were  impaired.   ADMISSION DIAGNOSES:   AXIS I:  Bipolar disorder not otherwise specified.   AXIS II:  None.   AXIS III:  1.  Hypokalemia.  2.  Hepatitis.   AXIS IV:  Moderate (stressors including problems with primary support group  and other psychosocial issues in part related to substance use).   AXIS V:  30/55.   HOSPITAL COURSE:  The patient was admitted and ordered routine p.r.n.  medications and underwent further monitoring.  Was encouraged to participate  in individual, group and milieu therapy.  STD testing as per patient's  request as well as pregnancy test were checked and patient was monitored  medically.  Lithium restarted.  The patient resumed  on medications and  monitored for detox as well as to develop a relapse prevention plan.  The  patient participated in substance abuse treatment at times due to relapse on  benzodiazepines which she is highly aware of the dangerousness since she had  been quite dependent on these in the past.  It appears that cocaine is more  of her issue now, although still needs to keep her guard up regarding the  benzodiazepines and alcohol.  The patient was placed on Librium taper and  stabilized on medications.   CONDITION ON DISCHARGE:  Discharged in improved condition.  Mood was  euthymic.  Affect brighter.  Thought processes was goal directed.  No  dangerous ideation.  No acute withdrawal symptoms.  Motivated to remain  abstinent and work 100% of the relapse prevention plan.  The patient again  was given medication education including metabolic issues related to  Seroquel, triglycerides and diabetes specifically and weight gain.   DISCHARGE MEDICATIONS:  1.  Seroquel 50 mg, 1-3 q.h.s. and 1/2 q.6h. p.r.n.  2.  Lithobid CR 300 mg, 1 in  the morning and 1-1/2 at 8 p.m.  3.  Lexapro 10 mg, 1/2 q.a.m.  4.  Symmetrel 100 mg p.r.n. cravings.  5.  Lamictal 25 mg daily.   FOLLOW UP:  The patient was to have a lithium level and BMET followed up  within five days and follow up with [facility] on June 19th  at 9 a.m.  The patient to see Dr. Wynonia Lawman and Donnie Aho and then follow  up with __________ May 15th at 5 p.m.   DISCHARGE DIAGNOSES:   AXIS I:  Bipolar disorder not otherwise specified.   AXIS II:  None.   AXIS III:  1.  Hypokalemia.  2.  Hepatitis.   AXIS IV:  Moderate (stressors including problems with primary support group  and other psychosocial issues in part related to substance use).   AXIS V:  Global Assessment of Functioning on discharge 55.       JEM/MEDQ  D:  05/09/2005  T:  05/09/2005  Job:  [id number]

## 2011-05-25 ENCOUNTER — Emergency Department (HOSPITAL_COMMUNITY)
Admission: EM | Admit: 2011-05-25 | Discharge: 2011-05-25 | Disposition: A | Discharge status: Home / Self Care | Payer: Medicaid Other | Attending: Emergency Medicine | Admitting: Emergency Medicine

## 2011-05-25 DIAGNOSIS — Z8619 Personal history of other infectious and parasitic diseases: Secondary | ICD-10-CM | POA: Insufficient documentation

## 2011-05-25 DIAGNOSIS — Z3201 Encounter for pregnancy test, result positive: Secondary | ICD-10-CM | POA: Insufficient documentation

## 2011-05-25 LAB — POCT PREGNANCY, URINE: Preg Test, Ur: POSITIVE

## 2011-05-26 ENCOUNTER — Inpatient Hospital Stay (HOSPITAL_COMMUNITY)
Admission: AD | Admit: 2011-05-26 | Discharge: 2011-05-26 | Disposition: A | Discharge status: Home / Self Care | Payer: Medicaid Other | Source: Ambulatory Visit | Attending: Obstetrics and Gynecology | Admitting: Obstetrics and Gynecology

## 2011-05-26 ENCOUNTER — Inpatient Hospital Stay (HOSPITAL_COMMUNITY): Payer: Medicaid Other

## 2011-05-26 DIAGNOSIS — O209 Hemorrhage in early pregnancy, unspecified: Secondary | ICD-10-CM | POA: Insufficient documentation

## 2011-05-26 DIAGNOSIS — R109 Unspecified abdominal pain: Secondary | ICD-10-CM

## 2011-05-26 DIAGNOSIS — O10019 Pre-existing essential hypertension complicating pregnancy, unspecified trimester: Secondary | ICD-10-CM

## 2011-05-26 LAB — WET PREP, GENITAL
Clue Cells Wet Prep HPF POC: NONE SEEN
Trich, Wet Prep: NONE SEEN
Yeast Wet Prep HPF POC: NONE SEEN

## 2011-05-26 LAB — CBC
HCT: 39.3 % (ref 36.0–46.0)
Hemoglobin: 12.8 g/dL (ref 12.0–15.0)
MCV: 81.9 fL (ref 78.0–100.0)
RBC: 4.8 MIL/uL (ref 3.87–5.11)
RDW: 14.1 % (ref 11.5–15.5)
WBC: 9.7 10*3/uL (ref 4.0–10.5)

## 2011-05-26 LAB — HCG, QUANTITATIVE, PREGNANCY: hCG, Beta Chain, Quant, S: 6793 m[IU]/mL — ABNORMAL HIGH (ref <5)

## 2011-05-28 LAB — GC/CHLAMYDIA PROBE AMP, GENITAL: GC Probe Amp, Genital: NEGATIVE

## 2011-06-05 ENCOUNTER — Inpatient Hospital Stay (HOSPITAL_COMMUNITY): Payer: Medicaid Other

## 2011-06-05 ENCOUNTER — Inpatient Hospital Stay (HOSPITAL_COMMUNITY)
Admission: AD | Admit: 2011-06-05 | Discharge: 2011-06-05 | Disposition: A | Discharge status: Home / Self Care | Payer: Medicaid Other | Source: Ambulatory Visit | Attending: Obstetrics and Gynecology | Admitting: Obstetrics and Gynecology

## 2011-06-05 ENCOUNTER — Encounter (HOSPITAL_COMMUNITY): Payer: Self-pay | Admitting: *Deleted

## 2011-06-05 DIAGNOSIS — O209 Hemorrhage in early pregnancy, unspecified: Secondary | ICD-10-CM | POA: Insufficient documentation

## 2011-06-05 HISTORY — DX: Anxiety disorder, unspecified: F41.9

## 2011-06-05 HISTORY — DX: Essential (primary) hypertension: I10

## 2011-06-05 LAB — URINE MICROSCOPIC-ADD ON

## 2011-06-05 LAB — URINALYSIS, ROUTINE W REFLEX MICROSCOPIC
Bilirubin Urine: NEGATIVE
Glucose, UA: NEGATIVE mg/dL
Protein, ur: NEGATIVE mg/dL
Urobilinogen, UA: 0.2 mg/dL (ref 0.0–1.0)

## 2011-06-05 NOTE — Progress Notes (Signed)
Pt states, " At 3:30 pm I started having a small amt of bleeding and it is a dark brown and about 30 min ago I started having pain in my lower right abd. I had an [place] on Tues and they said I had an irregular shaped sac. 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

## 2011-06-05 NOTE — Progress Notes (Signed)
Brown red when wiping after voiding, right side crampin

## 2011-06-05 NOTE — ED Provider Notes (Addendum)
History     Chief Complaint  Patient presents with  . Vaginal Bleeding  . Abdominal Pain   HPI  Only has small amount of dark blood when she wipes No abdominal pain.  OB History    Grav Para Term Preterm Abortions TAB SAB Ect Mult Living   6 2 2  0 3 2 1  0 0 2      Past Medical History  Diagnosis Date  . Anxiety   . Hypertension     Past Surgical History  Procedure Date  . Dilation and curettage of uterus   . Therapeutic abortion   . Therapeutic abortion     No family history on file.  History  Substance Use Topics  . Smoking status: Not on file  . Smokeless tobacco: Not on file  . Alcohol Use: Not on file    Allergies: No Known Allergies  Prescriptions prior to admission  Medication Sig Dispense Refill  . methyldopa (ALDOMET) 500 MG tablet Take 500 mg by mouth 2 (two) times daily.        Marland Kitchen OVER THE COUNTER MEDICATION Pandora (Herbal Supplement).  Hold while in hospital.       . prenatal vitamin w/FE, FA (PRENATAL 1 + 1) 27-1 MG TABS Take 1 tablet by mouth daily.          ROS Physical Exam   Blood pressure 103/77, pulse 69, temperature 99.4 F (37.4 C), temperature source Oral, resp. rate 18, height 5' 1.5" (1.562 m), weight 162 lb 2 oz (73.539 kg).  Physical Exam Abdomen soft and nontender In no distress  MAU Course  Procedures  MDM  OBSTETRIC <14 WK [place] AND TRANSVAGINAL OB US  Technique: Both transabdominal and transvaginal ultrasound  examinations were performed for complete evaluation of the  gestation as well as the maternal uterus, adnexal regions, and  pelvic cul-de-sac. Transvaginal technique was performed to assess  early pregnancy.  Comparison: Previous examinations, the most recent dated  05/26/2011.  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  Yolk sac: Visualized and grossly normal in shape.  Embryo: Visualized.  Cardiac Activity: Visualized.  Heart Rate: 118 bpm  CRL: 4.9  mm 6 w 2 d [place] EDC: 01/27/2012  Maternal uterus/adnexae:  Normal appearing maternal ovaries. No uterine masses visualized.  No subchorionic hemorrhage or free peritoneal fluid seen.  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 No complicating features seen.  Original Report Authenticated By: Darrol Angel, M.D.      Assessment:  Bleeding in pregnancy Viable pregnancy  Plan[facility]. No sex when bleeding    Nolene Bernheim, NP 06/05/11 2122  Nolene Bernheim, NP 06/05/11 2132

## 2011-06-15 LAB — HEMOGLOBIN
Chlamydia Probe Amp: NEGATIVE
GC Probe Amp, Genital: NEGATIVE
Glucose, 1 hour: 121
HCT: 42 %
Hemoglobin: 13.5 g/dL (ref 12.0–16.0)
Pap: NEGATIVE
RPR Ser Ql: NEGATIVE
UDS Comment: NEGATIVE
Urine Culture, OB: NEGATIVE
platelet count: 238

## 2011-06-15 LAB — ABO/RH

## 2011-06-22 ENCOUNTER — Other Ambulatory Visit: Payer: Self-pay | Admitting: Obstetrics & Gynecology

## 2011-06-22 ENCOUNTER — Ambulatory Visit (INDEPENDENT_AMBULATORY_CARE_PROVIDER_SITE_OTHER): Payer: Self-pay | Admitting: Obstetrics & Gynecology

## 2011-06-22 DIAGNOSIS — Q513 Bicornate uterus: Secondary | ICD-10-CM

## 2011-06-22 DIAGNOSIS — I10 Essential (primary) hypertension: Secondary | ICD-10-CM

## 2011-06-22 DIAGNOSIS — F319 Bipolar disorder, unspecified: Secondary | ICD-10-CM

## 2011-06-22 DIAGNOSIS — B1920 Unspecified viral hepatitis C without hepatic coma: Secondary | ICD-10-CM | POA: Insufficient documentation

## 2011-06-22 LAB — ADULT HIGH FREQUENCY OSCILLATORY VENTILATION (HFOV)
Hep Delta Ab: NEGATIVE
Varicella IgG: IMMUNE

## 2011-06-22 LAB — POCT URINALYSIS DIP (DEVICE)
Glucose, UA: NEGATIVE mg/dL
Nitrite: NEGATIVE
Urobilinogen, UA: 0.2 mg/dL (ref 0.0–1.0)

## 2011-06-22 MED ORDER — METHYLDOPA 500 MG PO TABS: 500.0000 mg | ORAL_TABLET | Freq: Two times a day (BID) | ORAL | Status: DC

## 2011-06-22 MED ORDER — PRENATAL PLUS 27-1 MG PO TABS: 1.0000 | ORAL_TABLET | Freq: Every day | ORAL | Status: DC

## 2011-06-22 NOTE — Progress Notes (Signed)
Addended by: Adam Phenix on: 06/22/2011 11:04 AM   Modules accepted: Orders

## 2011-06-22 NOTE — Progress Notes (Signed)
Nutrition Note:  Saw for 1st [facility] visit DX. HTN, GERD, overweight, hx of excessive pregnancy weight gain reported. Current wt. Gain 6+11 is 2.5# > expected for wks gest.  Reports cravings of empty calories snacks and excessive soda (6-7 x qd).  Patient intake- 3 meals and several snacks, reports no food allergies. Plans to increase activity, 30 min cardio 3 times week, will also work to eliminate soda and other high calorie beverages.   Will increase fruits and veggies and water.   Initiated [facility] certification, will complete @ [facility] office when Medicaid is active.

## 2011-06-22 NOTE — Progress Notes (Signed)
Transfer from [facility] with hypertension, hepatitis C positive, normal LFT, h/o opiate dependence on no meds now. Wants BTL. Declines genetic screening. Exam deferred today

## 2011-06-22 NOTE — Progress Notes (Signed)
p-76 brown discharge after sexual intercourse. Pressure in lower back.

## 2011-06-22 NOTE — Progress Notes (Signed)
Nutrition Consult and [facility] Referral Needed

## 2011-07-01 ENCOUNTER — Encounter: Payer: Self-pay | Admitting: Obstetrics and Gynecology

## 2011-07-13 ENCOUNTER — Ambulatory Visit (INDEPENDENT_AMBULATORY_CARE_PROVIDER_SITE_OTHER): Payer: Medicaid Other | Admitting: Obstetrics & Gynecology

## 2011-07-13 VITALS — BP 141/91 | HR 87 | Temp 97.1°F | Wt 167.2 lb

## 2011-07-13 DIAGNOSIS — N898 Other specified noninflammatory disorders of vagina: Secondary | ICD-10-CM

## 2011-07-13 DIAGNOSIS — O0990 Supervision of high risk pregnancy, unspecified, unspecified trimester: Secondary | ICD-10-CM

## 2011-07-13 DIAGNOSIS — O26899 Other specified pregnancy related conditions, unspecified trimester: Secondary | ICD-10-CM

## 2011-07-13 LAB — POCT URINALYSIS DIP (DEVICE)
Bilirubin Urine: NEGATIVE
Ketones, ur: NEGATIVE mg/dL
Protein, ur: NEGATIVE mg/dL
Specific Gravity, Urine: 1.02 (ref 1.005–1.030)
pH: 6 (ref 5.0–8.0)

## 2011-07-13 NOTE — Progress Notes (Signed)
Patient called and reported that her boyfriend has a foul smelling discharge coming from his penis, asked if we could do cultures. A wet prep was sent during her office visit, and gc and chlamydia sent from urine.

## 2011-07-13 NOTE — Progress Notes (Signed)
C/o of spotting for about a week.

## 2011-07-13 NOTE — Progress Notes (Signed)
Addended by: Sherre Lain A on: 07/13/2011 02:23 PM   Modules accepted: Orders

## 2011-07-13 NOTE — Progress Notes (Signed)
Addended by: Sherre Lain A on: 07/13/2011 10:01 AM   Modules accepted: Orders

## 2011-07-13 NOTE — Progress Notes (Signed)
Pt now only has brownish spotting, no pain, slight discharge no odor, some body itching. Brownish D/C on exam cervix closed, wet prep pending. Declines integ screen, consider Quad screen next visit. Take meds as ordered

## 2011-07-14 ENCOUNTER — Telehealth: Payer: Self-pay | Admitting: *Deleted

## 2011-07-14 ENCOUNTER — Other Ambulatory Visit: Payer: Self-pay | Admitting: Obstetrics & Gynecology

## 2011-07-14 LAB — WET PREP, GENITAL
Clue Cells Wet Prep HPF POC: NONE SEEN
Trich, Wet Prep: NONE SEEN
Yeast Wet Prep HPF POC: NONE SEEN

## 2011-07-14 LAB — GC/CHLAMYDIA PROBE AMP, URINE: Chlamydia, Swab/Urine, PCR: NEGATIVE

## 2011-07-14 NOTE — Telephone Encounter (Signed)
Pt wants test results from yesterday

## 2011-07-15 ENCOUNTER — Encounter: Payer: Self-pay | Admitting: Obstetrics & Gynecology

## 2011-07-15 NOTE — Telephone Encounter (Signed)
I spoke with patient and notified her that the wet prep was negative as well as the GC and Chlamydia.

## 2011-07-23 ENCOUNTER — Encounter: Payer: Self-pay | Admitting: *Deleted

## 2011-07-29 ENCOUNTER — Encounter: Payer: Self-pay | Admitting: Obstetrics & Gynecology

## 2011-08-01 ENCOUNTER — Encounter (HOSPITAL_COMMUNITY): Payer: Self-pay | Admitting: *Deleted

## 2011-08-01 ENCOUNTER — Inpatient Hospital Stay (HOSPITAL_COMMUNITY)
Admission: EM | Admit: 2011-08-01 | Discharge: 2011-08-03 | DRG: 781 | Disposition: A | Discharge status: Home / Self Care | Payer: Medicaid Other | Attending: Internal Medicine | Admitting: Internal Medicine

## 2011-08-01 ENCOUNTER — Inpatient Hospital Stay (EMERGENCY_DEPARTMENT_HOSPITAL)
Admission: AD | Admit: 2011-08-01 | Discharge: 2011-08-01 | Disposition: A | Discharge status: Other Acute Inpatient Hospital | Payer: Medicaid Other | Source: Ambulatory Visit | Attending: Obstetrics & Gynecology | Admitting: Obstetrics & Gynecology

## 2011-08-01 DIAGNOSIS — O99891 Other specified diseases and conditions complicating pregnancy: Secondary | ICD-10-CM | POA: Diagnosis present

## 2011-08-01 DIAGNOSIS — J45909 Unspecified asthma, uncomplicated: Secondary | ICD-10-CM | POA: Insufficient documentation

## 2011-08-01 DIAGNOSIS — O99340 Other mental disorders complicating pregnancy, unspecified trimester: Secondary | ICD-10-CM | POA: Insufficient documentation

## 2011-08-01 DIAGNOSIS — O169 Unspecified maternal hypertension, unspecified trimester: Principal | ICD-10-CM | POA: Diagnosis present

## 2011-08-01 DIAGNOSIS — F1910 Other psychoactive substance abuse, uncomplicated: Secondary | ICD-10-CM | POA: Diagnosis present

## 2011-08-01 DIAGNOSIS — F319 Bipolar disorder, unspecified: Secondary | ICD-10-CM | POA: Insufficient documentation

## 2011-08-01 DIAGNOSIS — O99330 Smoking (tobacco) complicating pregnancy, unspecified trimester: Secondary | ICD-10-CM | POA: Diagnosis present

## 2011-08-01 DIAGNOSIS — R51 Headache: Secondary | ICD-10-CM | POA: Diagnosis present

## 2011-08-01 DIAGNOSIS — I10 Essential (primary) hypertension: Secondary | ICD-10-CM

## 2011-08-01 DIAGNOSIS — O3400 Maternal care for unspecified congenital malformation of uterus, unspecified trimester: Secondary | ICD-10-CM | POA: Insufficient documentation

## 2011-08-01 DIAGNOSIS — R519 Headache, unspecified: Secondary | ICD-10-CM

## 2011-08-01 DIAGNOSIS — R202 Paresthesia of skin: Secondary | ICD-10-CM

## 2011-08-01 DIAGNOSIS — K219 Gastro-esophageal reflux disease without esophagitis: Secondary | ICD-10-CM | POA: Insufficient documentation

## 2011-08-01 DIAGNOSIS — F411 Generalized anxiety disorder: Secondary | ICD-10-CM | POA: Insufficient documentation

## 2011-08-01 DIAGNOSIS — B1920 Unspecified viral hepatitis C without hepatic coma: Secondary | ICD-10-CM | POA: Diagnosis present

## 2011-08-01 DIAGNOSIS — O139 Gestational [pregnancy-induced] hypertension without significant proteinuria, unspecified trimester: Secondary | ICD-10-CM | POA: Insufficient documentation

## 2011-08-01 DIAGNOSIS — O0990 Supervision of high risk pregnancy, unspecified, unspecified trimester: Secondary | ICD-10-CM

## 2011-08-01 DIAGNOSIS — R200 Anesthesia of skin: Secondary | ICD-10-CM

## 2011-08-01 DIAGNOSIS — E876 Hypokalemia: Secondary | ICD-10-CM | POA: Diagnosis present

## 2011-08-01 DIAGNOSIS — Q513 Bicornate uterus: Secondary | ICD-10-CM | POA: Insufficient documentation

## 2011-08-01 LAB — COMPREHENSIVE METABOLIC PANEL
AST: 13 U/L (ref 0–37)
Albumin: 3 g/dL — ABNORMAL LOW (ref 3.5–5.2)
Alkaline Phosphatase: 62 U/L (ref 39–117)
BUN: 5 mg/dL — ABNORMAL LOW (ref 6–23)
BUN: 5 mg/dL — ABNORMAL LOW (ref 6–23)
CO2: 21 mEq/L (ref 19–32)
Calcium: 9.4 mg/dL (ref 8.4–10.5)
Chloride: 99 mEq/L (ref 96–112)
Creatinine, Ser: <0.47 mg/dL — ABNORMAL LOW (ref 0.50–1.10)
Creatinine, Ser: 0.47 mg/dL — ABNORMAL LOW (ref 0.50–1.10)
Glucose, Bld: 94 mg/dL (ref 70–99)
Potassium: 3.2 mEq/L — ABNORMAL LOW (ref 3.5–5.1)
Total Bilirubin: 0.1 mg/dL — ABNORMAL LOW (ref 0.3–1.2)
Total Protein: 6.4 g/dL (ref 6.0–8.3)

## 2011-08-01 LAB — URINALYSIS, ROUTINE W REFLEX MICROSCOPIC
Bilirubin Urine: NEGATIVE
Ketones, ur: NEGATIVE mg/dL
Ketones, ur: NEGATIVE mg/dL
Leukocytes, UA: NEGATIVE
Leukocytes, UA: NEGATIVE
Nitrite: NEGATIVE
Protein, ur: NEGATIVE mg/dL
Specific Gravity, Urine: 1.013 (ref 1.005–1.030)
Urobilinogen, UA: 0.2 mg/dL (ref 0.0–1.0)
Urobilinogen, UA: 0.2 mg/dL (ref 0.0–1.0)
pH: 6.5 (ref 5.0–8.0)

## 2011-08-01 LAB — CBC
HCT: 35.9 % — ABNORMAL LOW (ref 36.0–46.0)
Hemoglobin: 12.7 g/dL (ref 12.0–15.0)
MCH: 28.1 pg (ref 26.0–34.0)
MCV: 80.1 fL (ref 78.0–100.0)
MCV: 80.4 fL (ref 78.0–100.0)
Platelets: 221 10*3/uL (ref 150–400)
RBC: 4.48 MIL/uL (ref 3.87–5.11)
RBC: 4.45 MIL/uL (ref 3.87–5.11)
RDW: 15 % (ref 11.5–15.5)
RDW: 14.6 % (ref 11.5–15.5)
WBC: 12.9 10*3/uL — ABNORMAL HIGH (ref 4.0–10.5)
WBC: 11.6 10*3/uL — ABNORMAL HIGH (ref 4.0–10.5)

## 2011-08-01 LAB — DIFFERENTIAL
Basophils Relative: 0 % (ref 0–1)
Eosinophils Absolute: 0.1 10*3/uL (ref 0.0–0.7)
Eosinophils Relative: 1 % (ref 0–5)
Lymphs Abs: 2.6 10*3/uL (ref 0.7–4.0)
Neutrophils Relative %: 71 % (ref 43–77)

## 2011-08-01 LAB — RAPID URINE DRUG SCREEN, HOSP PERFORMED
Barbiturates: NOT DETECTED
Cocaine: NOT DETECTED

## 2011-08-01 MED ORDER — LACTATED RINGERS IV SOLN
INTRAVENOUS | Status: DC
  Administered 2011-08-01: 21:00:00 via INTRAVENOUS

## 2011-08-01 MED ORDER — ACETAMINOPHEN 500 MG PO TABS
1000.0000 mg | ORAL_TABLET | Freq: Once | ORAL | Status: AC
  Administered 2011-08-01: 500 mg via ORAL
  Filled 2011-08-01: qty 2

## 2011-08-01 MED ORDER — LABETALOL HCL 5 MG/ML IV SOLN
20.0000 mg | Freq: Once | INTRAVENOUS | Status: AC
  Administered 2011-08-01: 20 mg via INTRAVENOUS
  Filled 2011-08-01: qty 4

## 2011-08-01 NOTE — ED Provider Notes (Signed)
History   The patient is a 35 year old G6P2032 female at [redacted]w[redacted]d weeks gestation who presents to [facility]U reporting HA, worsening of CHTN and numbness in right arm and leg starting at 1900 today. She receives [facility] [place] at the [facility]. She in not on BP meds outside of pregnancy and has been well-controlled on Aldomet prior to today. She reports one episode of seeing spots/floaters several weeks ago and denies epigastric pain. She does not have a Hx of Pre-eclampsia.   Chief Complaint  Patient presents with  . Hypertension   Hypertension This is a chronic problem. The current episode started more than 1 year ago. The problem has been rapidly worsening since onset. Condition status: Previously controlled on Aldomet. Associated symptoms include headaches (started behind R ear, spread to bilat occipital and frontal areas. Dull, constant, improved w/ Tylenol). Pertinent negatives include no blurred vision, chest pain, neck pain or palpitations. (Numbness in right arm and leg. ) Risk factors for coronary artery disease include family history and smoking/tobacco exposure. Past treatments include central alpha agonists. There are no compliance problems.  There is no history of angina, kidney disease, CAD/MI, CVA, heart failure, left ventricular hypertrophy, PVD or retinopathy. None.    OB History    Grav Para Term Preterm Abortions TAB SAB Ect Mult Living   6 2 2  0 3 2 1  0 0 2      Past Medical History  Diagnosis Date  . Anxiety   . Asthma   . GERD (gastroesophageal reflux disease)   . STD (female)     HX of STD's  . Abnormal Pap smear 1998    had colpo  . HCV infection     HX of Hep C  . Hypertension   . Bipolar disorder     No meds, previously on Lithium  . 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 Bicornuate uterus     Past Surgical History  Procedure Date  . Dilation and curettage of uterus   . Therapeutic abortion   . Therapeutic abortion   . Tonsillectomy       Family History  Problem Relation Age of Onset  . Hypertension Mother   . Thyroid disease Maternal Grandmother   . Diabetes Paternal Grandfather     History  Substance Use Topics  . Smoking status: Current Everyday Smoker -- 0.2 packs/day for 20 years    Types: Cigarettes  . Smokeless tobacco: Never Used  . Alcohol Use: No  Hx heroin use. None x 4 years.  Allergies: No Known Allergies  Prescriptions prior to admission  Medication Sig Dispense Refill  . acetaminophen (TYLENOL) 325 MG tablet Take 650 mg by mouth every 6 (six) hours as needed.       . famotidine (PEPCID) 20 MG tablet Take 20 mg by mouth 2 (two) times daily as needed.       . methyldopa (ALDOMET) 500 MG tablet Take 500 mg by mouth 2 (two) times daily.        . prenatal vitamin w/FE, FA (PRENATAL 1 + 1) 27-1 MG TABS Take 1 tablet by mouth daily.  30 each  6  . DISCONTD: methyldopa (ALDOMET) 500 MG tablet Take 1 tablet (500 mg total) by mouth 2 (two) times daily.  60 tablet  2  . OVER THE COUNTER MEDICATION Pandora (Herbal Supplement).  Hold while in hospital.         Review of Systems  HENT: Negative  for ear pain, sore throat and neck pain.   Eyes: Negative for blurred vision, double vision, photophobia and pain.  Cardiovascular: Negative for chest pain, palpitations and leg swelling.  Gastrointestinal: Negative for abdominal pain.  Musculoskeletal: Negative for myalgias, back pain and falls.  Neurological: Positive for tingling, sensory change and headaches (started behind R ear, spread to bilat occipital and frontal areas. Dull, constant, improved w/ Tylenol). Negative for dizziness, speech change, focal weakness, seizures, loss of consciousness and weakness.   Physical Exam   Patient Vitals for the past 24 hrs:  BP Temp Temp src Pulse Resp SpO2 Height Weight  08/01/11 2050 129/85 mmHg - - 85  - - - -  08/01/11 2045 148/101 mmHg - - 93  - - - -  08/01/11 2019 153/102 mmHg - - - - - - -  08/01/11 2011  155/105 mmHg - - 97  - - - -  08/01/11 2000 155/112 mmHg - - 101  18  - - -  08/01/11 1951 150/112 mmHg - - 96  - 98 % - -  08/01/11 1948 157/120 mmHg 98.5 F (36.9 C) Oral 94  20  - 5\' 2"  (1.575 m) 76.93 kg (169 lb 9.6 oz)   Physical Exam  Constitutional: She is oriented to person, place, and time. She appears well-developed and well-nourished. No distress.  HENT:  Head: Normocephalic and atraumatic.  Eyes: Pupils are equal, round, and reactive to light.  Neck: Normal range of motion.  Cardiovascular: Normal rate, regular rhythm and normal heart sounds.   Respiratory: Effort normal and breath sounds normal.  Musculoskeletal: She exhibits no edema.  Neurological: She is alert and oriented to person, place, and time. She has normal strength. A sensory deficit (right arm and leg "feel different" than left) is present. Coordination normal.  Reflex Scores:      Patellar reflexes are 1+ on the right side and 1+ on the left side. Skin: Skin is warm and dry. She is not diaphoretic.  Psychiatric: She has a normal mood and affect. Her speech is normal and behavior is normal. Judgment and thought content normal. Her mood appears not anxious. Cognition and memory are normal.   Results for orders placed during the hospital encounter of 08/01/11 (from the past 24 hour(s))  CBC     Status: Abnormal   Collection Time   08/01/11  8:17 PM      Component Value Range   WBC 12.9 (*) 4.0 - 10.5 (K/uL)   RBC 4.48  3.87 - 5.11 (MIL/uL)   Hemoglobin 12.7  12.0 - 15.0 (g/dL)   HCT 16.1 (*) 09.6 - 46.0 (%)   MCV 80.1  78.0 - 100.0 (fL)   MCH 28.3  26.0 - 34.0 (pg)   MCHC 35.4  30.0 - 36.0 (g/dL)   RDW 04.5  40.9 - 81.1 (%)   Platelets 229  150 - 400 (K/uL)  COMPREHENSIVE METABOLIC PANEL     Status: Abnormal (Preliminary result)   Collection Time   08/01/11  8:17 PM      Component Value Range   Sodium 131 (*) 135 - 145 (mEq/L)   Potassium 3.2 (*) 3.5 - 5.1 (mEq/L)   Chloride 99  96 - 112 (mEq/L)   CO2 21   19 - 32 (mEq/L)   Glucose, Bld 94  70 - 99 (mg/dL)   BUN 5 (*) 6 - 23 (mg/dL)   Creatinine, Ser PENDING  0.50 - 1.10 (mg/dL)   Calcium 9.7  8.4 - 10.5 (mg/dL)   Total Protein 6.3  6.0 - 8.3 (g/dL)   Albumin 3.2 (*) 3.5 - 5.2 (g/dL)   AST 16  0 - 37 (U/L)   ALT 11  0 - 35 (U/L)   Alkaline Phosphatase 62  39 - 117 (U/L)   Total Bilirubin 0.1 (*) 0.3 - 1.2 (mg/dL)   GFR calc non Af Amer PENDING  >60 (mL/min)   GFR calc Af Amer PENDING  >60 (mL/min)   UA and UDS pending  MAU Course  Procedures   Assessment and Plan  Assessment: 1. Unexplained exacerbation of CHTN w/ neurologic changes. Not likely due to pre-eclampsia 2. 14.5 week IUP  Plan: 1. Transfer to [facility] for neuro eval, management of HTN per consult w/ Dr. Debroah Loop. Dr. Preston Fleeting at [facility] notified of plan to transfer. 2. Labetalol 20 mg IVP  3. CBC, CMET, UA for protein, UDS 4. Report given to Maylon Cos, CNM. Will complete transfer of pt.  Dorathy Kinsman 08/01/2011, 8:44 PM

## 2011-08-01 NOTE — Progress Notes (Signed)
Pt reports numbness in R arm for 1 hour and a bad headache today. Pt has had headaches off and on recently, but today has been worse.

## 2011-08-01 NOTE — Progress Notes (Signed)
G6P2 at 15wks. Hx HTN. Was at work and at 1900 started having numbness in face and R arm. Now R foot numb.

## 2011-08-02 ENCOUNTER — Inpatient Hospital Stay (HOSPITAL_COMMUNITY): Payer: Medicaid Other

## 2011-08-02 LAB — CBC
HCT: 34.2 % — ABNORMAL LOW (ref 36.0–46.0)
Hemoglobin: 12.1 g/dL (ref 12.0–15.0)
MCH: 28.3 pg (ref 26.0–34.0)
MCV: 80.1 fL (ref 78.0–100.0)
Platelets: 212 10*3/uL (ref 150–400)
RBC: 4.27 MIL/uL (ref 3.87–5.11)
WBC: 9.4 10*3/uL (ref 4.0–10.5)

## 2011-08-02 LAB — BASIC METABOLIC PANEL
BUN: 5 mg/dL — ABNORMAL LOW (ref 6–23)
CO2: 22 mEq/L (ref 19–32)
Chloride: 104 mEq/L (ref 96–112)
Creatinine, Ser: <0.47 mg/dL — ABNORMAL LOW (ref 0.50–1.10)
Glucose, Bld: 96 mg/dL (ref 70–99)

## 2011-08-02 LAB — MAGNESIUM: Magnesium: 1.7 mg/dL (ref 1.5–2.5)

## 2011-08-03 LAB — DIFFERENTIAL
Eosinophils Relative: 2 % (ref 0–5)
Lymphocytes Relative: 25 % (ref 12–46)
Lymphs Abs: 2.1 10*3/uL (ref 0.7–4.0)
Monocytes Relative: 9 % (ref 3–12)

## 2011-08-03 LAB — CBC
HCT: 39 % (ref 36.0–46.0)
MCH: 27.8 pg (ref 26.0–34.0)
MCV: 82.1 fL (ref 78.0–100.0)
RDW: 14.9 % (ref 11.5–15.5)
WBC: 8.5 10*3/uL (ref 4.0–10.5)

## 2011-08-03 LAB — COMPREHENSIVE METABOLIC PANEL
Albumin: 3 g/dL — ABNORMAL LOW (ref 3.5–5.2)
BUN: 8 mg/dL (ref 6–23)
CO2: 23 mEq/L (ref 19–32)
Chloride: 101 mEq/L (ref 96–112)
Creatinine, Ser: <0.47 mg/dL — ABNORMAL LOW (ref 0.50–1.10)
Total Bilirubin: 0.1 mg/dL — ABNORMAL LOW (ref 0.3–1.2)

## 2011-08-03 LAB — MAGNESIUM: Magnesium: 1.8 mg/dL (ref 1.5–2.5)

## 2011-08-04 NOTE — Discharge Summary (Signed)
[name], [name]            ACCOUNT NO.:  [id number]  MEDICAL RECORD NO.:  [id number]  LOCATION:                                 FACILITY:  PHYSICIAN:  Talmage Nap, MD  DATE OF BIRTH:  03-08-1976  DATE OF ADMISSION:  08/02/2011 DATE OF DISCHARGE:  08/03/2011                        DISCHARGE SUMMARY - REFERRING   OB/GYN PHYSICIAN:  Scheryl Darter, MD  DISCHARGE DIAGNOSES: 1. Hypertension in pregnancy, now controlled. 2. History of polysubstance abuse. 3. Hepatitis C positive. 4. Hypokalemia.  HISTORY:  The patient is a 35 year old Caucasian female who is about [redacted] weeks pregnant, was admitted to the hospital on August 01, 2011, by Dr. Della Goo with a complaint of intractable headache.  The patient's headache is said to have been persistent, on and off for about 3 weeks, and this was said to be associated with elevated blood pressure.  There was, however, no history of nausea.  There is no history of vomiting.  There is no history of neck stiffness.  No fever, no chills, no rigor.  No history of blurry vision.  History of meningeal irritation at [facility].  The patient was to have an MRI. Because the study could not be done over the night, she was transferred to [facility] for an MRI study and also for 2 to 3 hours of observation.  PREADMISSION MEDICATIONS:  Include methyldopa (Aldomet) 500 mg 1 p.o. b.i.d.  PAST SURGICAL HISTORY: 1. D and C. 2. Tonsillectomy.  ALLERGIES:  She has no known allergies.  SOCIAL HISTORY:  The patient is a smoker and currently smokes about half a pack of cigarettes per day and she is about [redacted] weeks pregnant.  Denies any history of illicit drug use.  FAMILY HISTORY:  York Spaniel to be positive for colon cancer which her father has and also blood CA in paternal uncle.  There is also a family history of diabetes mellitus in paternal grandfather.  REVIEW OF SYSTEMS:  Essentially as documented in the initial  history and physical.  PHYSICAL EXAMINATION:  GENERAL:  At the time, the patient was seen by the admitting physician.  She was not in any distress. VITAL SIGNS: Temperature is 98.5, blood pressure 135/96, heart rate was 80, respiratory rate was 16.  She was saturating 99% on room air.  Her headache pain level at the time of evaluation was about 3/10. HEENT: Pupils were said to be reactive to light and extraocular muscles are intact. NECK:  Supple.  No jugular venous distention.  No carotid bruits.  No lymphadenopathy.  No meningeal signs. CHEST:  Clear to auscultation. HEART:  Sounds are 1 and 2. ABDOMEN:  Soft, nontender.  Liver, spleen tip not palpable.  Bowel sounds are positive. EXTREMITIES:  No pedal edema. NEUROLOGIC:  Exam was nonfocal. MUSCULOSKELETAL SYSTEM:  Unremarkable. SKIN:  Showed normal turgor.  LAB DATA:  Initial complete blood count with no differential showed WBC of 12.9, hemoglobin of 12.7, hematocrit of 35.9, MCV of 80.1, platelet count of 229.  Comprehensive metabolic panel showed sodium of 131, potassium of 3.2, chloride of 99 with a bicarb of 21, glucose is 94, BUN is 5, creatinine less than 0.47.  Urinalysis unremarkable.  Urine  drug screen negative.  A repeat complete blood count with differential done on August 03, 2011, showed WBC of 8.5, hemoglobin of 13.2, hematocrit of 39.0, MCV of 82.40, platelet count of 2 to 3.  Comprehensive metabolic panel showed sodium of 134, potassium of 3.5, chloride of 101 with a bicarb of 23, glucose is 84, BUN is 8, creatinine is less than 0.47.  LFTs are normal.  Magnesium level is 1.8.  Imaging studies done on the patient include MRI of the brain without contrast and it was normal.  HOSPITAL COURSE:  The patient was admitted to the telemetry with an impression of chronic headache with hypokalemia.  She was given Phenergan 12.5 mg IV q.6 p.r.n. for nausea and headache was controlled with Tylenol as well as morphine  IV.  Since the patient was found to be hypokalemic, she was given potassium chloride 10 mEq IV over 1 hour x2 runs.  The patient was, however, seen by me for the very first time in this admission on August 02, 2011.  During this encounter, the patient still complained about headache, but no blurry vision.  Examination of the patient did not show any neuro lateralizing signs.  Her vital signs; blood pressure was 142/108, pulse 82, respiratory rate 20, and neurologic exam was nonfocal, i.e., Kernig and Brudzinski signs were all unremarkable.  At this point, the patient was given IV hydralazine 5 mg stat and to continue with hydralazine 50 mg p.o. t.i.d.  Dose of Aldomet was also adjusted to 500 mg p.o. t.i.d.  The patient's headache subsequently subsided and has remained so since August 02, 2011.  The patient was seen by me this morning which is August 03, 2011.  Denies any headache, no complaint.  Examination was essentially unremarkable. Vital signs are stable, BP is 112/64, pulse is 85, respiratory rate 18, temperature is 98.8; medically stable.  Plan is for the patient to be discharged home today on activity as tolerated, daily weight, and fluid restriction to be followed up by her OB/GYN in 1-2 weeks.  MEDICATION TO BE TAKEN AT HOME:  Include; 1. Hydralazine 50 mg p.o. t.i.d. 2. Methyldopa 500 mg one p.o. t.i.d. 3. Famotidine 20 mg p.o. daily. 4. Prenatal vitamin one p.o. daily.     Talmage Nap, MD     CN/MEDQ  D:  08/03/2011  T:  08/03/2011  Job:  [id number]  Electronically Signed by Talmage Nap  on 08/04/2011 06:37:45 PM

## 2011-08-09 NOTE — H&P (Signed)
[name], [name]            ACCOUNT NO.:  [id number]  MEDICAL RECORD NO.:  [id number]  LOCATION:  1503                         FACILITY:  [facility]  PHYSICIAN:  Della Goo, M.D. DATE OF BIRTH:  29-Apr-1976  DATE OF ADMISSION:  08/01/2011 DATE OF DISCHARGE:                             HISTORY & PHYSICAL   DATE OF ADMISSION:  June 01, 2011.  PRIMARY CARE PHYSICIAN:  None.  OBSTETRIC GYNECOLOGIST:  Scheryl Darter, MD  CHIEF COMPLAINT:  Severe intractable headache.  HISTORY OF PRESENT ILLNESS:  This is a 35 year old female, who is [redacted] weeks pregnant who presented to the [facility] for evaluation secondary to elevated blood pressures and severe headaches, which have been unremitting for the past 3 weeks. Patient is [redacted] weeks pregnant.  Patient's blood pressure had been found to be 138/90; however, based on her clinical symptoms and history of migraine headaches; however, this headache was described by her as being different, so she was transferred to [facility] for further evaluation.  Patient was referred to Hospitalist Service for admission and 23-hour observation and treatment.  An MRI study is not available overnight and patient has been referred for an MRI study.  She was also found to have mild hypokalemia with a potassium level of 2.8.  PAST MEDICAL HISTORY:  Significant for: 1. Hypertension. 2. Past history of polysubstance abuse. 3. Hepatitis C. 4. Obesity.  PAST SURGICAL HISTORY: 1. History of a D and C. 2. Tonsillectomy.  MEDICATIONS:  At this time include: 1. Methyldopa 500 mg one p.o. b.i.d.  She has been on this for her     hypertension since July 2nd. 2. She is also on a prenatal vitamin.  ALLERGIES:  No known drug allergies.  SOCIAL HISTORY:  Patient is a smoker.  She is down to less than half a pack of cigarettes daily.  She has been smoking for 15 years.  She denies any alcohol usage.  She denies any recent  illicit drug usage. She states she has been clean for 2 years.  FAMILY HISTORY:  Positive for colon cancer in her father.  Positive for breast cancer in a paternal aunt.  Positive for diabetes mellitus in a paternal grandfather.  REVIEW OF SYSTEMS:  Pertinent as mentioned above.  PHYSICAL EXAMINATION FINDINGS:  GENERAL:  This is a 35 year old obese Caucasian female who is in no acute distress. VITAL SIGNS: Temperature 98.5, blood pressure 135/96, heart rate 80, respirations 16, O2 sats 99%.  Her pain level at this time is 3/10 and this is after medications have been administered in the ER. HEENT: Normocephalic, atraumatic.  Pupils are equally round and reactive to light.  Extraocular movements are intact.  Funduscopic benign.  There is no scleral icterus.  Nares are patent bilaterally.  Oropharynx is clear. NECK: Supple.  Full range of motion.  No thyromegaly, adenopathy, or jugular venous distention.  There are no meningeal signs. CARDIOVASCULAR:  Regular rate and rhythm.  No murmurs, gallops, or rubs. Normal S1, S2. CHEST:  Chest wall nontender, chest wall excursion is symmetric, and breathing is unlabored. LUNGS:  Clear to auscultation bilaterally.  No rales, rhonchi, or wheezes. ABDOMEN:  Positive bowel sounds, soft, nontender,  and nondistended.  No hepatosplenomegaly. EXTREMITIES: Without cyanosis, clubbing, or edema. NEUROLOGIC:  Patient is alert and oriented x3.  Cranial nerves are intact and her sensory and motor functions are also intact.  There are no focal deficits.  LABORATORY STUDIES:  White blood cell count 11.6, hemoglobin 12.5, hematocrit 35.8, MCV 80.4, platelets 121, neutrophils 71%, and lymphocytes 23%.  Sodium 133, potassium 2.8, chloride 100, CO2 22, BUN 5, creatinine 0.47, and glucose 132.  Urinalysis negative.  ASSESSMENT:  35 year old female being admitted for 23-hour observation secondary to: 1. Chronic headaches. 2. Hypertension. 3. Migraine  headache history. 4. Hypokalemia. 5. Mild hyponatremia. 6. Pregnancy. 7. Obesity.  PLAN:  Patient has been admitted to 23-hour observation.  Pain control therapy has been ordered with morphine for now if needed.  Antiemetic therapy with Phenergan has also been ordered.  These have been checked for compatibility with pregnancy.  Patient has been placed on IV fluids for rehydration therapy and she will undergo an MRI of the brain without contrast in the a.m.  Precautions will be taken since patient is [redacted] weeks pregnant.  SCDs have been ordered for DVT prophylaxis since heparin products cannot be used in pregnancy.  Further workup will ensue pending results of patient's clinical course.     Della Goo, M.D.     HJ/MEDQ  D:  08/02/2011  T:  08/02/2011  Job:  [id number]  Electronically Signed by Della Goo M.D. on 08/09/2011 09:46:09 PM

## 2011-08-10 ENCOUNTER — Encounter: Payer: Self-pay | Admitting: Obstetrics and Gynecology

## 2011-08-10 ENCOUNTER — Ambulatory Visit (INDEPENDENT_AMBULATORY_CARE_PROVIDER_SITE_OTHER): Payer: Medicaid Other | Admitting: Obstetrics and Gynecology

## 2011-08-10 VITALS — BP 140/96 | Temp 97.5°F | Wt 170.2 lb

## 2011-08-10 DIAGNOSIS — O3400 Maternal care for unspecified congenital malformation of uterus, unspecified trimester: Secondary | ICD-10-CM

## 2011-08-10 DIAGNOSIS — Z3480 Encounter for supervision of other normal pregnancy, unspecified trimester: Secondary | ICD-10-CM

## 2011-08-10 DIAGNOSIS — I10 Essential (primary) hypertension: Secondary | ICD-10-CM

## 2011-08-10 DIAGNOSIS — Z3490 Encounter for supervision of normal pregnancy, unspecified, unspecified trimester: Secondary | ICD-10-CM

## 2011-08-10 DIAGNOSIS — O169 Unspecified maternal hypertension, unspecified trimester: Secondary | ICD-10-CM

## 2011-08-10 DIAGNOSIS — B182 Chronic viral hepatitis C: Secondary | ICD-10-CM

## 2011-08-10 LAB — POCT URINALYSIS DIP (DEVICE)
Glucose, UA: NEGATIVE mg/dL
Nitrite: NEGATIVE
Urobilinogen, UA: 0.2 mg/dL (ref 0.0–1.0)

## 2011-08-10 NOTE — Patient Instructions (Signed)
Pregnancy - Second Trimester The second trimester of pregnancy (3 to 6 months) is a period of rapid growth for you and your baby. At the end of the sixth month, your baby is about 9 inches long and weighs 1 1/2 pounds. You will begin to feel the baby move between 18 and 20 weeks of the pregnancy. This is called quickening. Weight gain is faster. A clear fluid (colostrum) may leak out of your breasts. You may feel small contractions of the womb (uterus). This is known as false labor or Braxton-Hicks contractions. This is like a practice for labor when the baby is ready to be born. Usually, the problems with morning sickness have usually passed by the end of your first trimester. Some women develop small dark blotches (called cholasma, mask of pregnancy) on their face that usually goes away after the baby is born. Exposure to the sun makes the blotches worse. Acne may also develop in some pregnant women and pregnant women who have acne, may find that it goes away. PRENATAL EXAMS  Blood work may continue to be done during prenatal exams. These tests are done to check on your health and the probable health of your baby. Blood work is used to follow your blood levels (hemoglobin). Anemia (low hemoglobin) is common during pregnancy. Iron and vitamins are given to help prevent this. You will also be checked for diabetes between 24 and 28 weeks of the pregnancy. Some of the previous blood tests may be repeated.   The size of the uterus is measured during each visit. This is to make sure that the baby is continuing to grow properly according to the dates of the pregnancy.   Your blood pressure is checked every prenatal visit. This is to make sure you are not getting toxemia.   Your urine is checked to make sure you do not have an infection, diabetes or protein in the urine.   Your weight is checked often to make sure gains are happening at the suggested rate. This is to ensure that both you and your baby are  growing normally.   Sometimes, an ultrasound is performed to confirm the proper growth and development of the baby. This is a test which bounces harmless sound waves off the baby so your caregiver can more accurately determine due dates.  Sometimes, a specialized test is done on the amniotic fluid surrounding the baby. This test is called an amniocentesis. The amniotic fluid is obtained by sticking a needle into the belly (abdomen). This is done to check the chromosomes in instances where there is a concern about possible genetic problems with the baby. It is also sometimes done near the end of pregnancy if an early delivery is required. In this case, it is done to help make sure the baby's lungs are mature enough for the baby to live outside of the womb. CHANGES OCCURING IN THE SECOND TRIMESTER OF PREGNANCY Your body goes through many changes during pregnancy. They vary from person to person. Talk to your caregiver about changes you notice that you are concerned about.  During the second trimester, you will likely have an increase in your appetite. It is normal to have cravings for certain foods. This varies from person to person and pregnancy to pregnancy.   Your lower abdomen will begin to bulge.   You may have to urinate more often because the uterus and baby are pressing on your bladder. It is also common to get more bladder infections during pregnancy (  pain with urination). You can help this by drinking lots of fluids and emptying your bladder before and after intercourse.   You may begin to get stretch marks on your hips, abdomen, and breasts. These are normal changes in the body during pregnancy. There are no exercises or medications to take that prevent this change.   You may begin to develop swollen and bulging veins (varicose veins) in your legs. Wearing support hose, elevating your feet for 15 minutes, 3 to 4 times a day and limiting salt in your diet helps lessen the problem.    Heartburn may develop as the uterus grows and pushes up against the stomach. Antacids recommended by your caregiver helps with this problem. Also, eating smaller meals 4 to 5 times a day helps.   Constipation can be treated with a stool softener or adding bulk to your diet. Drinking lots of fluids, vegetables, fruits, and whole grains are helpful.   Exercising is also helpful. If you have been very active up until your pregnancy, most of these activities can be continued during your pregnancy. If you have been less active, it is helpful to start an exercise program such as walking.   Hemorrhoids (varicose veins in the rectum) may develop at the end of the second trimester. Warm sitz baths and hemorrhoid cream recommended by your caregiver helps hemorrhoid problems.   Backaches may develop during this time of your pregnancy. Avoid heavy lifting, wear low heal shoes and practice good posture to help with backache problems.   Some pregnant women develop tingling and numbness of their hand and fingers because of swelling and tightening of ligaments in the wrist (carpel tunnel syndrome). This goes away after the baby is born.   As your breasts enlarge, you may have to get a bigger bra. Get a comfortable, cotton, support bra. Do not get a nursing bra until the last month of the pregnancy if you will be nursing the baby.   You may get a dark line from your belly button to the pubic area called the linea nigra.   You may develop rosy cheeks because of increase blood flow to the face.   You may develop spider looking lines of the face, neck, arms and chest. These go away after the baby is born.  HOME CARE INSTRUCTIONS  It is extremely important to avoid all smoking, herbs, alcohol, and un-prescribed drugs during your pregnancy. These chemicals affect the formation and growth of the baby. Avoid these chemicals throughout the pregnancy to ensure the delivery of a healthy infant.   Most of your home  care instructions are the same as suggested for the first trimester of your pregnancy. Keep your caregiver's appointments. Follow your caregiver's instructions regarding medication use, exercise and diet.   During pregnancy, you are providing food for you and your baby. Continue to eat regular, well-balanced meals. Choose foods such as meat, fish, milk and other low fat dairy products, vegetables, fruits, and whole-grain breads and cereals. Your caregiver will tell you of the ideal weight gain.   A physical sexual relationship may be continued up until near the end of pregnancy if there are no other problems. Problems could include early (premature) leaking of amniotic fluid from the membranes, vaginal bleeding, abdominal pain, or other medical or pregnancy problems.   Exercise regularly if there are no restrictions. Check with your caregiver if you are unsure of the safety of some of your exercises. The greatest weight gain will occur in the last   2 trimesters of pregnancy. Exercise will help you:   Control your weight.   Get you in shape for labor and delivery.   Lose weight after you have the baby.   Wear a good support or jogging bra for breast tenderness during pregnancy. This may help if worn during sleep. Pads or tissues may be used in the bra if you are leaking colostrum.   Do not use hot tubs, steam rooms or saunas throughout the pregnancy.   Wear your seat belt at all times when driving. This protects you and your baby if you are in an accident.   Avoid raw meat, uncooked cheese, cat litter boxes and soil used by cats. These carry germs that can cause birth defects in the baby.   The second trimester is also a good time to visit your dentist for your dental health if this has not been done yet. Getting your teeth cleaned is OK. Use a soft toothbrush. Brush gently during pregnancy.   It is easier to loose urine during pregnancy. Tightening up and strengthening the pelvic muscles will  help with this problem. Practice stopping your urination while you are going to the bathroom. These are the same muscles you need to strengthen. It is also the muscles you would use as if you were trying to stop from passing gas. You can practice tightening these muscles up 10 times a set and repeating this about 3 times per day. Once you know what muscles to tighten up, do not perform these exercises during urination. It is more likely to contribute to an infection by backing up the urine.   Ask for help if you have financial, counseling or nutritional needs during pregnancy. Your caregiver will be able to offer counseling for these needs as well as refer you for other special needs.   Your skin may become oily. If so, wash your face with mild soap, use non-greasy moisturizer and oil or cream based makeup.  MEDICATIONS AND DRUG USE IN PREGNANCY  Take prenatal vitamins as directed. The vitamin should contain 1 milligram of folic acid. Keep all vitamins out of reach of children. Only a couple vitamins or tablets containing iron may be fatal to a baby or young child when ingested.   Avoid use of all medications, including herbs, over-the-counter medications, not prescribed or suggested by your caregiver. Only take over-the-counter or prescription medicines for pain, discomfort, or fever as directed by your caregiver. Do not use aspirin.   Let your caregiver also know about herbs you may be using.   Alcohol is related to a number of birth defects. This includes fetal alcohol syndrome. All alcohol, in any form, should be avoided completely. Smoking will cause low birth rate and premature babies.   Street/illegal drugs are very harmful to the baby. They are absolutely forbidden. A baby born to an addicted mother will be addicted at birth. The baby will go through the same withdrawal an adult does.  SEEK MEDICAL CARE IF:  You have any concerns or worries during your pregnancy. It is better to call with  your questions if you feel they cannot wait, rather than worry about them.  SEEK IMMEDIATE MEDICAL CARE IF:  An unexplained oral temperature above 100.4 develops, or as your caregiver suggests.   You have leaking of fluid from the vagina (birth canal). If leaking membranes are suspected, take your temperature and tell your caregiver of this when you call.   There is vaginal spotting, bleeding, or passing  clots. Tell your caregiver of the amount and how many pads are used. Light spotting in pregnancy is common, especially following intercourse.   You develop a bad smelling vaginal discharge with a change in the color from clear to white.   You continue to feel sick to your stomach (nauseated) and have no relief from remedies suggested. You vomit blood or coffee ground like materials.   You loose more than 2 pounds of weight or gain more than 2 pounds of weight over a weeks time, or as suggested by your caregiver.   You notice swelling of your face, hands, and feet or legs.   You get exposed to German measles and have never had them.   You are exposed to fifth disease or chicken pox.   You develop belly (abdominal) pain. Round ligament discomfort is a common non-cancerous (benign) cause of abdominal pain in pregnancy. Your caregiver still must evaluate you.   You develop a bad headache that does not go away.   You develop fever, diarrhea, pain with urination or shortness of breath.   You develop visual problems, blurry or double vision.   You fall, are in a car accident or any kind of trauma.   There is mental or physical violence at home.  Document Released: 11/03/2001 Document Re-Released: 02/03/2010 [employer] Patient Information 2011 [employer]. 

## 2011-08-10 NOTE — Progress Notes (Signed)
Patient without any complaints. Declined quad screen. Patient recently started on hydralazine for HTN. RTC in 1 week for OB F/U and BP check. Will schedule anatomy ultrasound in 2weeks

## 2011-08-10 NOTE — Progress Notes (Signed)
Pulse 102. Swelling in fingers. Was hospitalized sept 8,9, & 10. Thick white vaginal discharge with no odor or no itching.

## 2011-08-14 ENCOUNTER — Inpatient Hospital Stay (HOSPITAL_COMMUNITY)
Admission: AD | Admit: 2011-08-14 | Discharge: 2011-08-14 | Disposition: A | Discharge status: Home / Self Care | Payer: Medicaid Other | Source: Ambulatory Visit | Attending: Obstetrics & Gynecology | Admitting: Obstetrics & Gynecology

## 2011-08-14 ENCOUNTER — Inpatient Hospital Stay (HOSPITAL_COMMUNITY): Payer: Medicaid Other

## 2011-08-14 ENCOUNTER — Encounter (HOSPITAL_COMMUNITY): Payer: Self-pay

## 2011-08-14 DIAGNOSIS — O99891 Other specified diseases and conditions complicating pregnancy: Secondary | ICD-10-CM | POA: Insufficient documentation

## 2011-08-14 DIAGNOSIS — O26899 Other specified pregnancy related conditions, unspecified trimester: Secondary | ICD-10-CM

## 2011-08-14 DIAGNOSIS — R109 Unspecified abdominal pain: Secondary | ICD-10-CM

## 2011-08-14 LAB — URINALYSIS, ROUTINE W REFLEX MICROSCOPIC
Leukocytes, UA: NEGATIVE
Protein, ur: NEGATIVE mg/dL
Urobilinogen, UA: 0.2 mg/dL (ref 0.0–1.0)
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
## 2011-08-17 ENCOUNTER — Ambulatory Visit (INDEPENDENT_AMBULATORY_CARE_PROVIDER_SITE_OTHER): Payer: Medicaid Other | Admitting: Physician Assistant

## 2011-08-17 ENCOUNTER — Encounter: Payer: Self-pay | Admitting: Physician Assistant

## 2011-08-17 VITALS — BP 150/107 | Temp 97.3°F | Wt 169.1 lb

## 2011-08-17 DIAGNOSIS — N760 Acute vaginitis: Secondary | ICD-10-CM

## 2011-08-17 LAB — POCT URINALYSIS DIP (DEVICE)
Glucose, UA: NEGATIVE mg/dL
Hgb urine dipstick: NEGATIVE
Nitrite: NEGATIVE
Urobilinogen, UA: 0.2 mg/dL (ref 0.0–1.0)

## 2011-08-17 MED ORDER — FLUCONAZOLE 150 MG PO TABS: 150.0000 mg | ORAL_TABLET | Freq: Once | ORAL | Status: AC

## 2011-08-17 MED ORDER — LABETALOL HCL 200 MG PO TABS: 200.0000 mg | ORAL_TABLET | Freq: Two times a day (BID) | ORAL | Status: DC
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
## 2011-08-17 NOTE — Patient Instructions (Signed)
Hypertension In Pregnancy High Blood Pressure in Pregnancy You can develop high blood pressure (hypertension) in the last half of your pregnancy (20 weeks of gestation or later). This can happen even if your blood pressure was normal before pregnancy. This condition is usually monitored closely because pregnancy-induced hypertension (PIH) can be associated with more serious problems. PIH can be a sign of a complication of pregnancy called preeclampsia. This is especially true if you have protein in your urine. If preeclampsia progresses, it can cause convulsions during your pregnancy (eclampsia). It can also become HELLP (hemolysis, elevated liver enzymes, and low platelet count) syndrome, which can lead to abnormalities of the liver, abnormalities of blood clotting, increased high blood pressure, and increased protein in the urine. Both conditions are very serious and can threaten the life of a mother and her baby. A woman who has high blood pressure before getting pregnant can also get superimposed preeclampsia, which is usually a more severe form of preeclampsia. HOME CARE INSTRUCTIONS  Make sure you understand these instructions and ask your caregiver any questions you have.   Follow your caregiver's instructions carefully.   Take medications as directed by your caregiver.  SEEK IMMEDIATE MEDICAL CARE IF YOU HAVE:  Stomach pain.   Sudden and excessive swelling in the hands, ankles, or face.   Weight gain of 4 pounds (1.8 kg) or more within 1 week.   Repeated vomiting.   Vaginal bleeding.   A lack of fetal movement.   Headache.   Vision problems (blurred or double vision).   Muscle twitching or spasm.   Shortness of breath.   Blue fingernails and lips.   Blood in the urine.  Document Released: 11/09/2005 Document Re-Released: 04/29/2010 [facility] Patient Information 2011 [place], [place].Smoking Cessation This document explains the best ways for you to quit smoking and new  treatments to help. It lists new medicines that can double or triple your chances of quitting and quitting for good. It also considers ways to avoid relapses and concerns you may have about quitting, including weight gain. NICOTINE: A POWERFUL ADDICTION If you have tried to quit smoking, you know how hard it can be. It is hard because nicotine is a very addictive drug. For some people, it can be as addictive as heroin or cocaine. Usually, people make 2 or 3 tries, or more, before finally being able to quit. Each time you try to quit, you can learn about what helps and what hurts. Quitting takes hard work and a lot of effort, but you can quit smoking. QUITTING SMOKING IS ONE OF THE MOST IMPORTANT THINGS YOU WILL EVER DO:  You will live longer, feel better, and live better.   The impact on your body of quitting smoking is felt almost immediately:   Within 20 minutes, blood pressure decreases. Pulse returns to its normal level.   After 8 hours, carbon monoxide levels in the blood return to normal. Oxygen level increases.   After 24 hours, chance of heart attack starts to decrease. Breath, hair, and body stop smelling like smoke.   After 48 hours, damaged nerve endings begin to recover. Sense of taste and smell improve.   After 72 hours, the body is virtually free of nicotine. Bronchial tubes relax and breathing becomes easier.   After 2 to 12 weeks, lungs can hold more air. Exercise becomes easier and circulation improves.   Quitting will lower your chance of having a heart attack, stroke, cancer, or lung disease:   After 1 year,  the risk of coronary heart disease is cut in half.   After 5 years, the risk of stroke falls to the same as a nonsmoker.   After 10 years, the risk of lung cancer is cut in half and the risk of other cancers decreases significantly.   After 15 years, the risk of coronary heart disease drops, usually to the level of a nonsmoker.   If you are pregnant, quitting  smoking will improve your chances of having a healthy baby.   The people you live with, especially your children, will be healthier.   You will have extra money to spend on things other than cigarettes.  FIVE KEYS TO QUITTING Studies have shown that these 5 steps will help you quit smoking and quit for good. You have the best chances of quitting if you use them together: 1. Get ready.  2. Get support and encouragement.  3. Learn new skills and behaviors.  4. Get medicine to reduce your nicotine addiction and use it correctly.  5. Be prepared for relapse or difficult situations. Be determined to continue trying to quit, even if you do not succeed at first.  1. GET READY  Set a quit date.   Change your environment.   Get rid of ALL cigarettes, ashtrays, matches, and lighters in your home, car, and place of work.   Do not let people smoke in your home.   Review your past attempts to quit. Think about what worked and what did not.   Once you quit, do not smoke. NOT EVEN A PUFF!  2. GET SUPPORT AND ENCOURAGEMENT Studies have shown that you have a better chance of being successful if you have help. You can get support in many ways.  Tell your family, friends, and coworkers that you are going to quit and need their support. Ask them not to smoke around you.   Talk to your caregivers (doctor, dentist, nurse, pharmacist, psychologist, and/or smoking counselor).   Get individual, group, or telephone counseling and support. The more counseling you have, the better your chances are of quitting. Programs are available at [employer] and health centers. Call your local health department for information about programs in your area.   Spiritual beliefs and practices may help some smokers quit.   Quit meters are Photographer that keep track of quit statistics, such as amount of "quit-time," cigarettes not smoked, and money saved.   Many smokers find one or  more of the many self-help books available useful in helping them quit and stay off tobacco.  3. LEARN NEW SKILLS AND BEHAVIORS  Try to distract yourself from urges to smoke. Talk to someone, go for a walk, or occupy your time with a task.   When you first try to quit, change your routine. Take a different route to work. Drink tea instead of coffee. Eat breakfast in a different place.   Do something to reduce your stress. Take a hot bath, exercise, or read a book.   Plan something enjoyable to do every day. Reward yourself for not smoking.   Explore interactive web-based programs that specialize in helping you quit.  4. GET MEDICINE AND USE IT CORRECTLY Medicines can help you stop smoking and decrease the urge to smoke. Combining medicine with the above behavioral methods and support can quadruple your chances of successfully quitting smoking. The U.S. Food and Drug Administration (FDA) has approved 7 medicines to help you quit smoking. These medicines fall into  3 categories.  Nicotine replacement therapy (delivers nicotine to your body without the negative effects and risks of smoking):   Nicotine gum: Available over-the-counter.   Nicotine lozenges: Available over-the-counter.   Nicotine inhaler: Available by prescription.   Nicotine nasal spray: Available by prescription.   Nicotine skin patches (transdermal): Available by prescription and over-the-counter.   Antidepressant medicine (helps people abstain from smoking, but how this works is unknown):   Bupropion sustained-release (SR) tablets: Available by prescription.   Nicotinic receptor partial agonist (simulates the effect of nicotine in your brain):   Varenicline tartrate tablets: Available by prescription.   Ask your caregiver for advice about which medicines to use and how to use them. Carefully read the information on the package.   Everyone who is trying to quit may benefit from using a medicine. If you are pregnant  or trying to become pregnant, nursing an infant, you are under age 39, or you smoke fewer than 10 cigarettes per day, talk to your caregiver before taking any nicotine replacement medicines.   You should stop using a nicotine replacement product and call your caregiver if you experience nausea, dizziness, weakness, vomiting, fast or irregular heartbeat, mouth problems with the lozenge or gum, or redness or swelling of the skin around the patch that does not go away.   Do not use any other product containing nicotine while using a nicotine replacement product.   Talk to your caregiver before using these products if you have diabetes, heart disease, asthma, stomach ulcers, you had a recent heart attack, you have high blood pressure that is not controlled with medicine, a history of irregular heartbeat, or you have been prescribed medicine to help you quit smoking.  5. BE PREPARED FOR RELAPSE OR DIFFICULT SITUATIONS  Most relapses occur within the first 3 months after quitting. Do not be discouraged if you start smoking again. Remember, most people try several times before they finally quit.   You may have symptoms of withdrawal because your body is used to nicotine. You may crave cigarettes, be irritable, feel very hungry, cough often, get headaches, or have difficulty concentrating.   The withdrawal symptoms are only temporary. They are strongest when you first quit, but they will go away within 10 to 14 days.  Here are some difficult situations to watch for:  Alcohol. Avoid drinking alcohol. Drinking lowers your chances of successfully quitting.   Caffeine. Try to reduce the amount of caffeine you consume. It also lowers your chances of successfully quitting.   Other smokers. Being around smoking can make you want to smoke. Avoid smokers.   Weight gain. Many smokers will gain weight when they quit, usually less than 10 pounds. Eat a healthy diet and stay active. Do not let weight gain distract  you from your main goal, quitting smoking. Some medicines that help you quit smoking may also help delay weight gain. You can always lose the weight gained after you quit.   Bad mood or depression. There are a lot of ways to improve your mood other than smoking.  If you are having problems with any of these situations, talk to your caregiver. SPECIAL SITUATIONS OR CONDITIONS Studies suggest that everyone can quit smoking. Your situation or condition can give you a special reason to quit.  Pregnant women/New mothers: By quitting, you protect your baby's health and your own.   Hospitalized patients: By quitting, you reduce health problems and help healing.   Heart attack patients: By quitting, you reduce your  risk of a second heart attack.   Lung, head, and neck cancer patients: By quitting, you reduce your chance of a second cancer.   Parents of children and adolescents: By quitting, you protect your children from illnesses caused by secondhand smoke.  QUESTIONS TO THINK ABOUT Think about the following questions before you try to stop smoking. You may want to talk about your answers with your caregiver.  Why do you want to quit?   If you tried to quit in the past, what helped and what did not?   What will be the most difficult situations for you after you quit? How will you plan to handle them?   Who can help you through the tough times? Your family? Friends? Caregiver?   What pleasures do you get from smoking? What ways can you still get pleasure if you quit?  Here are some questions to ask your caregiver:  How can you help me to be successful at quitting?   What medicine do you think would be best for me and how should I take it?   What should I do if I need more help?   What is smoking withdrawal like? How can I get information on withdrawal?  Quitting takes hard work and a lot of effort, but you can quit smoking. FOR MORE INFORMATION Smokefree.gov (http://www.davis-sullivan.com/)  provides free, accurate, evidence-based information and professional assistance to help support the immediate and long-term needs of people trying to quit smoking. Document Released: 11/03/2001 Document Re-Released: 04/29/2010 [facility] Patient Information 2011 [place], [place].

## 2011-08-17 NOTE — Progress Notes (Signed)
Vaginal itching with creamy thick white discharge. Pulse 89. Pt states no headaches and no dizziness.

## 2011-08-18 LAB — WET PREP, GENITAL: Trich, Wet Prep: NONE SEEN

## 2011-08-21 ENCOUNTER — Ambulatory Visit (INDEPENDENT_AMBULATORY_CARE_PROVIDER_SITE_OTHER): Payer: Medicaid Other | Admitting: *Deleted

## 2011-08-21 DIAGNOSIS — I10 Essential (primary) hypertension: Secondary | ICD-10-CM

## 2011-08-24 ENCOUNTER — Telehealth: Payer: Self-pay | Admitting: *Deleted

## 2011-08-24 ENCOUNTER — Ambulatory Visit (HOSPITAL_COMMUNITY)
Admission: RE | Admit: 2011-08-24 | Discharge: 2011-08-24 | Disposition: A | Discharge status: Home / Self Care | Payer: Medicaid Other | Source: Ambulatory Visit | Attending: Obstetrics and Gynecology | Admitting: Obstetrics and Gynecology

## 2011-08-24 ENCOUNTER — Other Ambulatory Visit: Payer: Self-pay | Admitting: Obstetrics and Gynecology

## 2011-08-24 DIAGNOSIS — Z1389 Encounter for screening for other disorder: Secondary | ICD-10-CM | POA: Insufficient documentation

## 2011-08-24 DIAGNOSIS — O10019 Pre-existing essential hypertension complicating pregnancy, unspecified trimester: Secondary | ICD-10-CM | POA: Insufficient documentation

## 2011-08-24 DIAGNOSIS — O99330 Smoking (tobacco) complicating pregnancy, unspecified trimester: Secondary | ICD-10-CM | POA: Insufficient documentation

## 2011-08-24 DIAGNOSIS — O358XX0 Maternal care for other (suspected) fetal abnormality and damage, not applicable or unspecified: Secondary | ICD-10-CM | POA: Insufficient documentation

## 2011-08-24 DIAGNOSIS — I10 Essential (primary) hypertension: Secondary | ICD-10-CM

## 2011-08-24 DIAGNOSIS — O26879 Cervical shortening, unspecified trimester: Secondary | ICD-10-CM | POA: Insufficient documentation

## 2011-08-24 DIAGNOSIS — Z363 Encounter for antenatal screening for malformations: Secondary | ICD-10-CM | POA: Insufficient documentation

## 2011-08-24 NOTE — Telephone Encounter (Signed)
Pt states that this morning she was seen by an MD from our clinic at her ultrasound appt. She was told there is a problem with her cervix. She wanted to know if its okay for her to have sexual intercourse?

## 2011-08-24 NOTE — Telephone Encounter (Signed)
Called patient, states had Ultrasound today and a doctor from the ob dept came and talked with her about what if any symptoms she was having and patient states they told her her cervix was shortenend. Pt. States she forgot to ask doctor if she could have intercourse. Instructed patient that no intercourse is reccommended when patient has cervical changes/shortening, pt. Is 18 weeks, pt next appointment next week and will discuss with provider at that appointment.

## 2011-08-26 ENCOUNTER — Telehealth: Payer: Self-pay | Admitting: *Deleted

## 2011-08-26 NOTE — Telephone Encounter (Signed)
Pt left message stating that she is out of her BP med- not labetolol but a new med that was prescribed when she was in the hospital. She has called her pharmacy but Rx has not been sent to them yet. She has not taken any of the med since yesterday. I returned pt call and left message stating that we need to know which [facility] location she wants the prescription to be sent to as we have 2 locations on file.

## 2011-08-27 NOTE — Telephone Encounter (Signed)
Pt left a message stating that rx should go to [facility] on [address].

## 2011-08-27 NOTE — Telephone Encounter (Signed)
I consulted w/Dr. Marice Potter regarding pt's request.  She wants pt to just remain on the Labetalol only and denies refill for hydralazine. We will evaluate pt @ visit on Monday and make dosage changes to Labetalol if indicated. If pt has checked her BP and it is high today, we will increase her labetalol to 400 mg po BID. I called pt to discuss the information from Dr. Marice Potter. [name] has not checked her BP but said that she had a slight headache today @ lunchtime and she took her second labetalol 200 mg pill which was not due until tonight. She is wondering if she should still take her evening dose. I told her to take the evening dose today and then beginning tomorrow just take it as prescribed in the morning and evening. If she should develop a severe H/A or visual disturbances prior to Monday, she should go to [facility]U for evaluation. Pt voiced understanding.

## 2011-08-29 ENCOUNTER — Encounter (HOSPITAL_COMMUNITY): Payer: Self-pay

## 2011-08-29 ENCOUNTER — Inpatient Hospital Stay (HOSPITAL_COMMUNITY)
Admission: AD | Admit: 2011-08-29 | Discharge: 2011-08-30 | Disposition: A | Discharge status: Home / Self Care | Payer: Medicaid Other | Source: Ambulatory Visit | Attending: Family Medicine | Admitting: Family Medicine

## 2011-08-29 DIAGNOSIS — O200 Threatened abortion: Secondary | ICD-10-CM | POA: Insufficient documentation

## 2011-08-29 DIAGNOSIS — O2390 Unspecified genitourinary tract infection in pregnancy, unspecified trimester: Secondary | ICD-10-CM | POA: Insufficient documentation

## 2011-08-29 DIAGNOSIS — B9689 Other specified bacterial agents as the cause of diseases classified elsewhere: Secondary | ICD-10-CM | POA: Insufficient documentation

## 2011-08-29 DIAGNOSIS — N760 Acute vaginitis: Secondary | ICD-10-CM | POA: Insufficient documentation

## 2011-08-29 DIAGNOSIS — A499 Bacterial infection, unspecified: Secondary | ICD-10-CM | POA: Insufficient documentation

## 2011-08-29 LAB — URINALYSIS, ROUTINE W REFLEX MICROSCOPIC
Glucose, UA: NEGATIVE mg/dL
Ketones, ur: NEGATIVE mg/dL
Leukocytes, UA: NEGATIVE
Nitrite: NEGATIVE
Protein, ur: NEGATIVE mg/dL
Urobilinogen, UA: 0.2 mg/dL (ref 0.0–1.0)

## 2011-08-29 NOTE — Progress Notes (Signed)
Patient is here with c/o pelvic pressure that started last night. 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 Constant pain, with intense pelvic pressure when she walks. She denies any vaginal bleeding or discharge. She states that she was told that she has a short cervix and has an appt on Monday to be evaluted for plan of care.

## 2011-08-29 NOTE — Progress Notes (Signed)
Pt states, "Yesterday I was having pressure in my low abdomen and into my inner thighs. 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 When I sit it is a lot less than when I am walking."

## 2011-08-30 ENCOUNTER — Inpatient Hospital Stay (HOSPITAL_COMMUNITY): Payer: Medicaid Other

## 2011-08-30 LAB — AMNISURE RUPTURE OF MEMBRANE (ROM) NOT AT ARMC: Amnisure ROM: NEGATIVE

## 2011-08-30 MED ORDER — METRONIDAZOLE 500 MG PO TABS: 500.0000 mg | ORAL_TABLET | Freq: Two times a day (BID) | ORAL | Status: DC

## 2011-08-30 MED ORDER — METRONIDAZOLE 500 MG PO TABS
500.0000 mg | ORAL_TABLET | Freq: Once | ORAL | Status: AC
  Administered 2011-08-30: 500 mg via ORAL
  Filled 2011-08-30: qty 1

## 2011-08-30 MED ORDER — PROGESTERONE MICRONIZED 200 MG PO CAPS: ORAL_CAPSULE | ORAL | Status: DC

## 2011-08-30 NOTE — ED Provider Notes (Signed)
Pt. Seen, agree with documented assessment and plan--Pt. Counseled about risks/benefits of cerclage vs. Prometrium alone.  Pt. Elects for cerclage and prometrium.  Will schedule for Tuesday, will have some rx for BV.  Have left message with scheduler to post.
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
## 2011-08-31 ENCOUNTER — Ambulatory Visit (INDEPENDENT_AMBULATORY_CARE_PROVIDER_SITE_OTHER): Payer: Medicaid Other | Admitting: Family Medicine

## 2011-08-31 ENCOUNTER — Encounter: Payer: Self-pay | Admitting: Family Medicine

## 2011-08-31 DIAGNOSIS — F319 Bipolar disorder, unspecified: Secondary | ICD-10-CM

## 2011-08-31 DIAGNOSIS — Z23 Encounter for immunization: Secondary | ICD-10-CM

## 2011-08-31 DIAGNOSIS — O26879 Cervical shortening, unspecified trimester: Secondary | ICD-10-CM

## 2011-08-31 DIAGNOSIS — O169 Unspecified maternal hypertension, unspecified trimester: Secondary | ICD-10-CM

## 2011-08-31 LAB — POCT URINALYSIS DIP (DEVICE)
Bilirubin Urine: NEGATIVE
Glucose, UA: NEGATIVE mg/dL
Hgb urine dipstick: NEGATIVE
Ketones, ur: NEGATIVE mg/dL
Specific Gravity, Urine: 1.015 (ref 1.005–1.030)
Urobilinogen, UA: 0.2 mg/dL (ref 0.0–1.0)

## 2011-08-31 LAB — GC/CHLAMYDIA PROBE AMP, GENITAL: GC Probe Amp, Genital: NEGATIVE

## 2011-08-31 MED ORDER — INFLUENZA VIRUS VACC SPLIT PF IM SUSP
0.5000 mL | Freq: Once | INTRAMUSCULAR | Status: AC
  Administered 2011-08-31: 0.5 mL via INTRAMUSCULAR

## 2011-08-31 NOTE — Progress Notes (Signed)
  Subjective:    [name] is a 35 y.o. [id number] [redacted]w[redacted]d being seen today for her obstetrical visit.  Patient reports lower abdominal pain with movement.  Resting and on Prometrium.. Fetal movement: normal.  Objective:    BP 125/78  Temp 97.5 F (36.4 C)  Wt 171 lb (77.565 kg)  LMP 04/16/2011  Physical Exam  Exam  FHT:  140 BPM  Uterine Size: size equals dates  Presentation: unsure     Assessment:    Pregnancy:  [id number]    Plan:    Patient Active Problem List  Diagnoses  . HCV infection  . Hypertension  . Bipolar disorder  . Bicornuate uterus  . Short cervix affecting pregnancy  . Hypertension complicating pregnancy    For rescue cerclage in am. Follow up in 1 Week.

## 2011-08-31 NOTE — Patient Instructions (Signed)
Cervical Insufficiency °(Incompetent Cervix) °Cervical insufficiency (CI) is when the cervix is not strong enough to keep a baby (fetus) inside the womb (uterus). It occurs in the 2nd and early 3rd trimesters of pregnancy. The cervix will enlarge (dilate) on its own without contractions. When this happens, the membranes around the fetus will often balloon down into the birth canal (vagina). The membranes may break, which could end the pregnancy (miscarriage). °CAUSES  °The cause of a cervical insufficiency is often not known. Possible causes include: °· Injury to the cervix from a past pregnancy.  °· Injury to the cervix from past surgeries.  °· Being born with this defect of the cervix.  °· Cold cone, laser or LEEP (Loop electrocautery excision procedure) to the cervix.  °· Over dilating the cervix during an abortion.  °· Being exposed to DES (diethylstilbestrol) during pregnancy.  °· Lack of tissue (elastin and collagen) in the cervix that holds the baby in uterus.  °· Shorter cervix than normal.  °SYMPTOMS °· Spotting or bleeding from the vagina.  °· Feeling pressure in the vagina.  °· Unusual or abnormal vaginal discharge.  °DIAGNOSIS °· In many cases, the diagnosis is not made until after the pregnancy is lost.  °· Often this diagnosis will be made by exam.  °· Sometimes, an ultrasound of the cervix may be helpful. The ultrasound measures and follows the length of the cervix in women who are at risk of having CI.  °· Your caregiver may follow the dilatation of the cervix. Often, the diagnosis cannot be made until it happens. When this is the case, there is a much greater chance of early loss of the pregnancy. This means the baby is born too early to survive outside of the mother.  °PREVENTION °· A high risk patient needs to get frequent vaginal exams and serial ultrasounds.  °· Tie a suture, like a purse string, around the cervix (cerclage), before getting pregnant.  °TREATMENT °When CI is diagnosed early, the  treatment is a cerclage. This gives the cervix added support. The cerclage helps carry the baby to term. This is usually done before the first trimester (12 to 14 weeks). Cerclage is usually not done after the second trimester (24 weeks) unless it is an emergency. Your caregiver can discuss the risks of this procedure. The cerclage suture may be removed when labor begins or at term before labor begins. The suture can also be left in place for future pregnancies. If left in place, the baby is delivered by Cesarean section. °HOME CARE INSTRUCTIONS °· Keep your follow-up prenatal appointments.  °· Take medication as directed by your caregiver.  °· Avoid physical activities, exercise and sexual intercourse until you have permission from your caregiver.  °· Do not douche or use tampons.  °· Resume your usual diet.  °SEEK MEDICAL CARE IF: °· You develop abnormal vaginal discharge.  °· You have an oral temperature above 102° F (38.9° C).  °SEEK IMMEDIATE MEDICAL CARE IF: °· You have an oral temperature above 102° F (38.9° C), not controlled by medicine.  °· You develop uterine contractions.  °· You do not feel the baby moving or the baby is not moving as much as usual.  °· You pass out.  °· You have vaginal bleeding.  °· You are leaking fluid or have a gush of fluid from your vagina.  °· You have blood in your urine or pain when urinating.  °Document Released: 11/09/2005 Document Re-Released: 02/03/2010 °Exit[employer]® Patient Information ©2011   [employer]. °

## 2011-08-31 NOTE — Progress Notes (Signed)
Pt went into [facility] for contractions on 08/29/11 Dr Shawnie Pons is to insert a cerclage per pt.  Pain/pressure-"when I move around" vaginal area. Vaginal discharge clear, white.

## 2011-09-01 ENCOUNTER — Encounter (HOSPITAL_COMMUNITY): Payer: Self-pay | Admitting: Anesthesiology

## 2011-09-01 ENCOUNTER — Ambulatory Visit (HOSPITAL_COMMUNITY): Payer: Medicaid Other | Admitting: Anesthesiology

## 2011-09-01 ENCOUNTER — Ambulatory Visit (HOSPITAL_COMMUNITY)
Admission: RE | Admit: 2011-09-01 | Discharge: 2011-09-01 | Disposition: A | Discharge status: Home / Self Care | Payer: Medicaid Other | Source: Ambulatory Visit | Attending: Family Medicine | Admitting: Family Medicine

## 2011-09-01 ENCOUNTER — Encounter (HOSPITAL_COMMUNITY)
Admission: RE | Disposition: A | Discharge status: Home / Self Care | Payer: Self-pay | Source: Ambulatory Visit | Attending: Family Medicine

## 2011-09-01 ENCOUNTER — Encounter (HOSPITAL_COMMUNITY): Payer: Self-pay | Admitting: Family Medicine

## 2011-09-01 DIAGNOSIS — O169 Unspecified maternal hypertension, unspecified trimester: Secondary | ICD-10-CM

## 2011-09-01 DIAGNOSIS — N883 Incompetence of cervix uteri: Secondary | ICD-10-CM

## 2011-09-01 DIAGNOSIS — IMO0002 Reserved for concepts with insufficient information to code with codable children: Secondary | ICD-10-CM

## 2011-09-01 DIAGNOSIS — O26879 Cervical shortening, unspecified trimester: Secondary | ICD-10-CM | POA: Insufficient documentation

## 2011-09-01 HISTORY — PX: CERVICAL CERCLAGE: SHX1329

## 2011-09-01 LAB — BASIC METABOLIC PANEL
BUN: 5 mg/dL — ABNORMAL LOW (ref 6–23)
CO2: 19 mEq/L (ref 19–32)
Chloride: 102 mEq/L (ref 96–112)
Potassium: 3.1 mEq/L — ABNORMAL LOW (ref 3.5–5.1)

## 2011-09-01 LAB — CBC
HCT: 35.3 % — ABNORMAL LOW (ref 36.0–46.0)
Hemoglobin: 11.9 g/dL — ABNORMAL LOW (ref 12.0–15.0)
MCHC: 33.7 g/dL (ref 30.0–36.0)

## 2011-09-01 SURGERY — CERCLAGE, CERVIX, VAGINAL APPROACH
Anesthesia: Choice

## 2011-09-01 MED ORDER — LACTATED RINGERS IV SOLN
INTRAVENOUS | Status: DC
  Administered 2011-09-01 (×2): via INTRAVENOUS

## 2011-09-01 MED ORDER — LIDOCAINE IN DEXTROSE 5-7.5 % IV SOLN
INTRAVENOUS | Status: AC
  Filled 2011-09-01: qty 2

## 2011-09-01 MED ORDER — FENTANYL CITRATE 0.05 MG/ML IJ SOLN
INTRAMUSCULAR | Status: AC
  Administered 2011-09-01: 100 ug via INTRAVENOUS
  Filled 2011-09-01: qty 2

## 2011-09-01 MED ORDER — INDOMETHACIN 50 MG PO CAPS: 50.0000 mg | ORAL_CAPSULE | Freq: Four times a day (QID) | ORAL | Status: DC

## 2011-09-01 MED ORDER — EPHEDRINE 5 MG/ML INJ
INTRAVENOUS | Status: AC
  Filled 2011-09-01: qty 10

## 2011-09-01 MED ORDER — FENTANYL CITRATE 0.05 MG/ML IJ SOLN
INTRAMUSCULAR | Status: AC
  Filled 2011-09-01: qty 2

## 2011-09-01 MED ORDER — EPHEDRINE SULFATE 50 MG/ML IJ SOLN
INTRAMUSCULAR | Status: DC | PRN
  Administered 2011-09-01: 10 mg via INTRAVENOUS
  Administered 2011-09-01: 5 mg via INTRAVENOUS

## 2011-09-01 MED ORDER — FENTANYL CITRATE 0.05 MG/ML IJ SOLN
1.0000 ug/kg | INTRAMUSCULAR | Status: DC | PRN
  Administered 2011-09-01: 100 ug via INTRAVENOUS

## 2011-09-01 MED ORDER — FENTANYL CITRATE 0.05 MG/ML IJ SOLN
INTRAMUSCULAR | Status: DC | PRN
  Administered 2011-09-01 (×2): 50 ug via INTRAVENOUS

## 2011-09-01 MED ORDER — FAMOTIDINE 20 MG PO TABS
ORAL_TABLET | ORAL | Status: AC
  Filled 2011-09-01: qty 1

## 2011-09-01 SURGICAL SUPPLY — 22 items
CATH FOLEY 2WAY SLVR  5CC 16FR (CATHETERS) ×1
CATH FOLEY 2WAY SLVR 5CC 16FR (CATHETERS) ×1 IMPLANT
CATH ROBINSON RED A/P 16FR (CATHETERS) IMPLANT
CLOTH BEACON ORANGE TIMEOUT ST (SAFETY) ×2 IMPLANT
COUNTER NEEDLE 1200 MAGNETIC (NEEDLE) IMPLANT
GLOVE BIOGEL PI IND STRL 7.0 (GLOVE) ×1 IMPLANT
GLOVE BIOGEL PI INDICATOR 7.0 (GLOVE) ×1
GLOVE ECLIPSE 7.0 STRL STRAW (GLOVE) ×4 IMPLANT
GOWN PREVENTION PLUS LG XLONG (DISPOSABLE) ×2 IMPLANT
GOWN PREVENTION PLUS XLARGE (GOWN DISPOSABLE) ×2 IMPLANT
NEEDLE MAYO .5 CIRCLE (NEEDLE) ×2 IMPLANT
NEEDLE SPNL 22GX3.5 QUINCKE BK (NEEDLE) IMPLANT
PACK VAGINAL MINOR WOMEN LF (CUSTOM PROCEDURE TRAY) ×2 IMPLANT
PAD PREP 24X48 CUFFED NSTRL (MISCELLANEOUS) ×2 IMPLANT
SET CYSTO W/LG BORE CLAMP LF (SET/KITS/TRAYS/PACK) ×2 IMPLANT
SUT MERSILENE 5MM BP 1 12 (SUTURE) ×2 IMPLANT
SYR BULB IRRIGATION 50ML (SYRINGE) ×2 IMPLANT
SYR CONTROL 10ML LL (SYRINGE) IMPLANT
TOWEL OR 17X24 6PK STRL BLUE (TOWEL DISPOSABLE) ×4 IMPLANT
TUBING NON-CON 1/4 X 20 CONN (TUBING) IMPLANT
WATER STERILE IRR 1000ML POUR (IV SOLUTION) ×2 IMPLANT
YANKAUER SUCT BULB TIP NO VENT (SUCTIONS) IMPLANT

## 2011-09-01 NOTE — H&P (Signed)
[name] is an 35 y.o. [id number] [redacted]w[redacted]d female.   Chief Complaint: Cervical dilation HPI: found to have shortened cervix on U/S and dilated one cm.  Had BV which is being treated.  She is on Prometrium and bedrest for now.  Here for cerclage placement.  Past Medical History  Diagnosis Date  . Anxiety   . Asthma   . GERD (gastroesophageal reflux disease)   . STD (female)     HX of STD's  . Abnormal Pap smear 1998    had colpo  . HCV infection     HX of Hep C  . 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 Bicornuate uterus   . Depression   . Hypertension 2010    Past Surgical History  Procedure Date  . Therapeutic abortion   . Therapeutic abortion   . Tonsillectomy   . Dilation and curettage of uterus     SAB  . Finger surgery 2003    reattachment of right forefinger    Family History  Problem Relation Age of Onset  . Hypertension Mother   . Thyroid disease Maternal Grandmother   . Diabetes Paternal Grandfather    Social History:  reports that she has been smoking Cigarettes.  She has a 5 pack-year smoking history. She has never used smokeless tobacco. She reports that she does not drink alcohol or use illicit drugs.  Allergies: No Known Allergies  Medications Prior to Admission  Medication Dose Route Frequency Provider Last Rate Last Dose  . famotidine (PEPCID) 20 MG tablet           . lactated ringers infusion   Intravenous Continuous Jiles Garter 125 mL/hr at 09/01/11 1478    . metroNIDAZOLE (FLAGYL) tablet 500 mg  500 mg Oral Once [place], CNM   500 mg at 08/30/11 2956  . DISCONTD: famotidine (PEPCID) 20 MG tablet            Medications Prior to Admission  Medication Sig Dispense Refill  . famotidine (PEPCID) 20 MG tablet Take 20 mg by mouth 2 (two) times daily as needed. For heartburn       . labetalol (NORMODYNE) 200 MG tablet Take 1 tablet (200 mg total) by mouth 2 (two) times daily.  60 tablet  2  . metroNIDAZOLE (FLAGYL) 500  MG tablet Take 1 tablet (500 mg total) by mouth 2 (two) times daily.  14 tablet  0  . prenatal vitamin w/FE, FA (PRENATAL 1 + 1) 27-1 MG TABS Take 1 tablet by mouth daily.  30 each  6  . progesterone (PROMETRIUM) 200 MG capsule Insert one capsule vaginally daily.  30 capsule  3  . acetaminophen (TYLENOL) 325 MG tablet Take 650 mg by mouth every 6 (six) hours as needed. For pain         Results for orders placed during the hospital encounter of 09/01/11 (from the past 48 hour(s))  BASIC METABOLIC PANEL     Status: Abnormal   Collection Time   09/01/11  8:38 AM      Component Value Range Comment   Sodium 132 (*) 135 - 145 (mEq/L)    Potassium 3.1 (*) 3.5 - 5.1 (mEq/L)    Chloride 102  96 - 112 (mEq/L)    CO2 19  19 - 32 (mEq/L)    Glucose, Bld 142 (*) 70 - 99 (mg/dL)    BUN 5 (*) 6 - 23 (mg/dL)    Creatinine, Ser <  0.47 (*) 0.50 - 1.10 (mg/dL) REPEATED TO VERIFY   Calcium 9.8  8.4 - 10.5 (mg/dL)    GFR calc non Af Amer NOT CALCULATED  >90 (mL/min)    GFR calc Af Amer NOT CALCULATED  >90 (mL/min)   CBC     Status: Abnormal   Collection Time   09/01/11  8:38 AM      Component Value Range Comment   WBC 10.5  4.0 - 10.5 (K/uL)    RBC 4.16  3.87 - 5.11 (MIL/uL)    Hemoglobin 11.9 (*) 12.0 - 15.0 (g/dL)    HCT 86.5 (*) 78.4 - 46.0 (%)    MCV 84.9  78.0 - 100.0 (fL)    MCH 28.6  26.0 - 34.0 (pg)    MCHC 33.7  30.0 - 36.0 (g/dL)    RDW 69.6  29.5 - 28.4 (%)    Platelets 210  150 - 400 (K/uL)    No results found.  ROS:  Complains of some abdominal pain, lower.  Denies F/C, N/V, Diarrhea, constipation, No CP, no SOB.  Blood pressure 116/78, pulse 88, temperature 98.1 F (36.7 C), temperature source Oral, resp. rate 18, last menstrual period 04/16/2011, SpO2 98.00%. PE: Vitals reviewed Gen:  WD/WN female in NAD HEENT:  Sclera are without icterus Neck:  Supple, nml thyroid Lungs:  Clear bilaterally CV:  R/R/R no murmur Abd:  Gravid, NT Ext:  No c/c/e VE:  Cervix 1cm/  long  Assessment/Plan Patient Active Problem List  Diagnoses  . HCV infection  . Hypertension  . Bipolar disorder  . Bicornuate uterus  . Short cervix affecting pregnancy  . Hypertension complicating pregnancy  . Tobacco use complicating pregnancy or childbirth   For rescue cerclage. Risks include but are not limited to bleeding, infection, and ROM.  Genese Quebedeaux S 09/01/2011, 9:52 AM

## 2011-09-01 NOTE — Transfer of Care (Signed)
Immediate Anesthesia Transfer of Care Note  Patient: [name]  Procedure(s) Performed:  CERCLAGE CERVICAL  Patient Location: PACU  Anesthesia Type: Spinal  Level of Consciousness: awake, alert  and oriented  Airway & Oxygen Therapy: Patient Spontanous Breathing  Post-op Assessment: Report given to PACU RN and Post -op Vital signs reviewed and stable  Post vital signs: Reviewed and stable  Complications: No apparent anesthesia complications

## 2011-09-01 NOTE — Anesthesia Preprocedure Evaluation (Addendum)
Anesthesia Evaluation  Name, MR# and DOB Patient awake  General Assessment Comment  Reviewed: Allergy & Precautions, H&P , Patient's Chart, lab work & pertinent test results  Airway Mallampati: II TM Distance: >3 FB Neck ROM: full    Dental No notable dental hx.    Pulmonary Asthma: as a child.  clear to auscultation  Pulmonary exam normal       Cardiovascular Exercise Tolerance: Good hypertension (on meds, well controlled), Pt. on home beta blockers regular Normal    Neuro/Psych    GI/Hepatic GERD (on Meds) Medicated  Endo/Other    Renal/GU      Musculoskeletal   Abdominal   Peds  Hematology   Anesthesia Other Findings   Reproductive/Obstetrics                          Anesthesia Physical Anesthesia Plan  ASA: II  Anesthesia Plan: Spinal   Post-op Pain Management:    Induction:   Airway Management Planned:   Additional Equipment:   Intra-op Plan:   Post-operative Plan:   Informed Consent: I have reviewed the patients History and Physical, chart, labs and discussed the procedure including the risks, benefits and alternatives for the proposed anesthesia with the patient or authorized representative who has indicated his/her understanding and acceptance.   Dental Advisory Given  Plan Discussed with: CRNA  Anesthesia Plan Comments: (Lab work confirmed with CRNA in room. Platelets okay. Discussed spinal anesthetic, and patient consents to the procedure:  included risk of possible headache,backache, failed block, allergic reaction, and nerve injury. This patient was asked if she had any questions or concerns before the procedure started. )        Anesthesia Quick Evaluation

## 2011-09-01 NOTE — Anesthesia Procedure Notes (Signed)
Spinal Block  Patient location during procedure: OR Preanesthetic Checklist Completed: patient identified, site marked, surgical consent, pre-op evaluation, timeout performed, IV checked, risks and benefits discussed and monitors and equipment checked Spinal Block Patient position: sitting Prep: DuraPrep Patient monitoring: heart rate, cardiac monitor, continuous pulse ox and blood pressure Approach: midline Location: L3-4 Injection technique: single-shot Needle Needle type: Sprotte  Needle gauge: 24 G Needle length: 9 cm Assessment Sensory level: T4

## 2011-09-01 NOTE — Op Note (Signed)
Preoperative diagnosis: Advanced cervical dilation    Postoperative diagnosis: Same  Procedure: Cervical cerclage  Surgeon: Tinnie Gens, M.D.  Anesthesia: Spinal- Cristela Blue, MD  Findings: Cervix dilated 2 cm, with bulging BOW  Estimated blood loss: 150  Specimens: None  Reason for procedure: [name] [id number] [redacted]w[redacted]d, she was found to have a shortened cervix, BV and dilation at 18 wks, and was treated with Flagyl and scheduled for rescue cerclage.  Procedure: Patient was taken to the operating room where spinal analgesia was administered. She was prepped and draped in the usual sterile fashion. A Foley catheter is used to drain her bladder. A timeout was performed. The patient had SCDs in place and had received Flagyl prior to procedure. The patient was in dorsal lithotomy. A weighted speculum was placed inside the vagina. A Deaver was used anteriorly. The cervix was noted to be open with bulging bag of water.  A foley was inserted through the cervix and the balloon inflated to push the membranes back.  The bladder was back filled with 400 cc of sterile water. The cervix was grasped with an open ring  forcep. A 5 mm Mersilene band on a cutting needle was used and to put in a purse-string suture. This was started at 12:00 and exiting at 9:00, starting at 9:00 and exiting at 6:00, starting at 6:00 and exiting at 3:00, starting at the clinic in the 12:00. A suture was then tied down. The vagina was irrigated with normal saline. All instrument, needle and lap counts were correct x2. The patient was awakened and taken to recovery in stable condition.  Nan Maya SMD 09/01/2011 10:55 am

## 2011-09-01 NOTE — Anesthesia Postprocedure Evaluation (Signed)
Anesthesia Post Note  Patient: [name]  Procedure(s) Performed:  CERCLAGE CERVICAL  Anesthesia type: Spinal  Patient location: PACU  Post pain: Pain level controlled  Post assessment: Post-op Vital signs reviewed  Last Vitals:  Filed Vitals:   09/01/11 1200  BP: 118/69  Pulse: 83  Temp:   Resp:     Post vital signs: Reviewed  Level of consciousness: awake  Complications: No apparent anesthesia complications

## 2011-09-02 ENCOUNTER — Inpatient Hospital Stay (HOSPITAL_COMMUNITY)
Admission: AD | Admit: 2011-09-02 | Discharge: 2011-09-02 | Disposition: A | Discharge status: Home / Self Care | Payer: Medicaid Other | Source: Ambulatory Visit | Attending: Family Medicine | Admitting: Family Medicine

## 2011-09-02 ENCOUNTER — Telehealth: Payer: Self-pay | Admitting: *Deleted

## 2011-09-02 ENCOUNTER — Inpatient Hospital Stay (HOSPITAL_COMMUNITY): Payer: Medicaid Other

## 2011-09-02 ENCOUNTER — Encounter (HOSPITAL_COMMUNITY): Payer: Self-pay | Admitting: *Deleted

## 2011-09-02 DIAGNOSIS — N898 Other specified noninflammatory disorders of vagina: Secondary | ICD-10-CM | POA: Insufficient documentation

## 2011-09-02 DIAGNOSIS — IMO0002 Reserved for concepts with insufficient information to code with codable children: Secondary | ICD-10-CM

## 2011-09-02 DIAGNOSIS — O3430 Maternal care for cervical incompetence, unspecified trimester: Secondary | ICD-10-CM | POA: Insufficient documentation

## 2011-09-02 DIAGNOSIS — O99891 Other specified diseases and conditions complicating pregnancy: Secondary | ICD-10-CM | POA: Insufficient documentation

## 2011-09-02 DIAGNOSIS — O169 Unspecified maternal hypertension, unspecified trimester: Secondary | ICD-10-CM

## 2011-09-02 DIAGNOSIS — O26879 Cervical shortening, unspecified trimester: Secondary | ICD-10-CM

## 2011-09-02 LAB — GLUCOSE, CAPILLARY: Glucose-Capillary: 74 mg/dL (ref 70–99)

## 2011-09-02 LAB — POCT FERN TEST: Fern Test: NEGATIVE

## 2011-09-02 LAB — URINE MICROSCOPIC-ADD ON

## 2011-09-02 LAB — URINALYSIS, ROUTINE W REFLEX MICROSCOPIC
Glucose, UA: NEGATIVE mg/dL
Protein, ur: NEGATIVE mg/dL
Specific Gravity, Urine: <1.005 — ABNORMAL LOW (ref 1.005–1.030)

## 2011-09-02 NOTE — ED Provider Notes (Signed)
[name] [id number] @[redacted]w[redacted]d  Chief Complaint  Patient presents with  . Rupture of Membranes    SUBJECTIVE  HPI: 1 day postop rescue cerclage placement (cx 2/BBOW). At 12 MN, first noted wetness requiring her to change underwear. This occurred twice more during the night. No gush of fluid. Has had brown scant blood pv since procedure. Denies UCs or cramping now, but has had slight cramping overnight. On Flagyl for BV. Perceives normal FM.  Blood glucose 08/01/11: 132 and 09/01/11 142.   Pregnancy course: First tri spotting x 12 wks; Dynamic cx by [place] 08/14/11, prometrium rx 2 days ago  Past Medical History  Diagnosis Date  . Anxiety   . GERD (gastroesophageal reflux disease)   . STD (female)     HX of STD's  . Abnormal Pap smear 1998    had colpo  . HCV infection     HX of Hep C  . 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 Bicornuate uterus   . Depression   . Hypertension 2010  . Asthma     childhood asthma   Ob Hx: Gyn Hx: Past Surgical History  Procedure Date  . Therapeutic abortion   . Therapeutic abortion   . Tonsillectomy   . Dilation and curettage of uterus     SAB  . Finger surgery 2003    reattachment of right forefinger   History   Social History  . Marital Status: Single    Spouse Name: N/A    Number of Children: N/A  . Years of Education: N/A   Occupational History  . Not on file.   Social History Main Topics  . Smoking status: Current Everyday Smoker -- 0.2 packs/day for 20 years    Types: Cigarettes  . Smokeless tobacco: Never Used  . Alcohol Use: No  . Drug Use: No  . Sexually Active: Yes   Other Topics Concern  . Not on file   Social History Narrative  . No narrative on file   Current Facility-Administered Medications on File Prior to Encounter  Medication Dose Route Frequency Provider Last Rate Last Dose  . DISCONTD: ePHEDrine injection    PRN Madison Hickman   5 mg at 09/01/11 1032  . DISCONTD: famotidine (PEPCID) 20  MG tablet           . DISCONTD: fentaNYL (SUBLIMAZE) 0.05 MG/ML injection 1-3 mcg/kg  1-3 mcg/kg Intravenous PRN Rosaura Carpenter [place]   100 mcg at 09/01/11 1122  . DISCONTD: fentaNYL (SUBLIMAZE) 0.05 MG/ML injection    PRN Madison Hickman   50 mcg at 09/01/11 1004  . DISCONTD: lactated ringers infusion   Intravenous Continuous Jiles Garter       Current Outpatient Prescriptions on File Prior to Encounter  Medication Sig Dispense Refill  . acetaminophen (TYLENOL) 325 MG tablet Take 650 mg by mouth every 6 (six) hours as needed. For pain       . famotidine (PEPCID) 20 MG tablet Take 20 mg by mouth 2 (two) times daily as needed. For heartburn       . indomethacin (INDOCIN) 50 MG capsule Take 1 capsule (50 mg total) by mouth 4 (four) times daily.  12 capsule  0  . labetalol (NORMODYNE) 200 MG tablet Take 1 tablet (200 mg total) by mouth 2 (two) times daily.  60 tablet  2  . metroNIDAZOLE (FLAGYL) 500 MG tablet Take 1 tablet (500 mg total) by mouth 2 (two) times daily.  14  tablet  0  . prenatal vitamin w/FE, FA (PRENATAL 1 + 1) 27-1 MG TABS Take 1 tablet by mouth daily.  30 each  6  . progesterone (PROMETRIUM) 200 MG capsule Insert one capsule vaginally daily.  30 capsule  3   No Known Allergies  ROS: Pertinent items in HPI  OBJECTIVE  BP 122/91  Pulse 92  Temp(Src) 98.7 F (37.1 C) (Oral)  Resp 20  Ht 5\' 3"  (1.6 m)  Wt 78.744 kg (173 lb 9.6 oz)  BMI 30.75 kg/m2  SpO2 98%  LMP 04/16/2011    DT FHR 150  Physical Exam:  General: WN/WD in NAD Abd: soft, NT, S=D SSE: perineum dry, mod amt thick brown discharge,small amt watery fluid in vault is fern neg, cx obscured Results for orders placed during the hospital encounter of 09/02/11 (from the past 24 hour(s))  URINALYSIS, ROUTINE W REFLEX MICROSCOPIC     Status: Abnormal   Collection Time   09/02/11  9:15 AM      Component Value Range   Color, Urine YELLOW  YELLOW    Appearance CLEAR  CLEAR    Specific Gravity, Urine  <1.005 (*) 1.005 - 1.030    pH 6.5  5.0 - 8.0    Glucose, UA NEGATIVE  NEGATIVE (mg/dL)   Hgb urine dipstick MODERATE (*) NEGATIVE    Bilirubin Urine NEGATIVE  NEGATIVE    Ketones, ur NEGATIVE  NEGATIVE (mg/dL)   Protein, ur NEGATIVE  NEGATIVE (mg/dL)   Urobilinogen, UA 0.2  0.0 - 1.0 (mg/dL)   Nitrite NEGATIVE  NEGATIVE    Leukocytes, UA NEGATIVE  NEGATIVE   URINE MICROSCOPIC-ADD ON     Status: Normal   Collection Time   09/02/11  9:15 AM      Component Value Range   Squamous Epithelial / LPF RARE  RARE    WBC, UA 0-2  <3 (WBC/hpf)   Bacteria, UA RARE  RARE   POCT FERN TEST     Status: Normal   Collection Time   09/02/11 10:53 AM      Component Value Range   Fern Test Negative    GLUCOSE, CAPILLARY     Status: Normal   Collection Time   09/02/11 11:50 AM      Component Value Range   Glucose-Capillary 74  70 - 99 (mg/dL)       ASSESSMENT  POD#1 s/p rescue cerclage. Leukorrhea without evidence of PROM BS elevations this pregnancy    PLAN Bedrest/pelvic rest. F/U [facility] 09/07/11

## 2011-09-02 NOTE — ED Provider Notes (Signed)
Chart reviewed and agree with management and plan.  

## 2011-09-02 NOTE — Telephone Encounter (Signed)
Patient left a message stating that she had her cerclage placed yesterday by Dr. Shawnie Pons. Last night she woke up several times with wet underwear and had to change them. She is still leaking a clear fluid. I advised her to go to [facility]U to be evaluated. Dr Natale Milch was also involved in this discussion and agreed that patient should go to MAU. Pt agrees and will go directly to MAU.

## 2011-09-02 NOTE — Progress Notes (Signed)
Pt states she had a cervical cerclage placed by Dr. Shawnie Pons on 10-9. Started leaking clear fluid during the night and had to change panties three times. Occasional cramping.

## 2011-09-02 NOTE — ED Notes (Signed)
No fluid noted on perineum @ present.

## 2011-09-07 ENCOUNTER — Ambulatory Visit (INDEPENDENT_AMBULATORY_CARE_PROVIDER_SITE_OTHER): Payer: Medicaid Other | Admitting: Family Medicine

## 2011-09-07 ENCOUNTER — Other Ambulatory Visit: Payer: Self-pay | Admitting: Obstetrics and Gynecology

## 2011-09-07 DIAGNOSIS — Z87898 Personal history of other specified conditions: Secondary | ICD-10-CM | POA: Insufficient documentation

## 2011-09-07 DIAGNOSIS — O0990 Supervision of high risk pregnancy, unspecified, unspecified trimester: Secondary | ICD-10-CM

## 2011-09-07 DIAGNOSIS — O10019 Pre-existing essential hypertension complicating pregnancy, unspecified trimester: Secondary | ICD-10-CM

## 2011-09-07 DIAGNOSIS — O26879 Cervical shortening, unspecified trimester: Secondary | ICD-10-CM

## 2011-09-07 DIAGNOSIS — F1911 Other psychoactive substance abuse, in remission: Secondary | ICD-10-CM

## 2011-09-07 LAB — POCT URINALYSIS DIP (DEVICE)
Nitrite: NEGATIVE
Protein, ur: NEGATIVE mg/dL
Urobilinogen, UA: 0.2 mg/dL (ref 0.0–1.0)

## 2011-09-07 LAB — PREGNANCY, URINE: Preg Test, Ur: NEGATIVE

## 2011-09-07 NOTE — Progress Notes (Signed)
Pt has pain when she gets up a lot. Pt states that since getting the cerclage she urinates on herself.

## 2011-09-07 NOTE — Patient Instructions (Addendum)
Cervical Insufficiency (Incompetent Cervix) Cervical insufficiency (CI) is when the cervix is not strong enough to keep a baby (fetus) inside the womb (uterus). It occurs in the 2nd and early 3rd trimesters of pregnancy. The cervix will enlarge (dilate) on its own without contractions. When this happens, the membranes around the fetus will often balloon down into the birth canal (vagina). The membranes may break, which could end the pregnancy (miscarriage). CAUSES  The cause of a cervical insufficiency is often not known. Possible causes include:  Injury to the cervix from a past pregnancy.   Injury to the cervix from past surgeries.   Being born with this defect of the cervix.   Cold cone, laser or LEEP (Loop electrocautery excision procedure) to the cervix.   Over dilating the cervix during an abortion.   Being exposed to DES (diethylstilbestrol) during pregnancy.   Lack of tissue (elastin and collagen) in the cervix that holds the baby in uterus.   Shorter cervix than normal.  SYMPTOMS  Spotting or bleeding from the vagina.   Feeling pressure in the vagina.   Unusual or abnormal vaginal discharge.  DIAGNOSIS  In many cases, the diagnosis is not made until after the pregnancy is lost.   Often this diagnosis will be made by exam.   Sometimes, an ultrasound of the cervix may be helpful. The ultrasound measures and follows the length of the cervix in women who are at risk of having CI.   Your caregiver may follow the dilatation of the cervix. Often, the diagnosis cannot be made until it happens. When this is the case, there is a much greater chance of early loss of the pregnancy. This means the baby is born too early to survive outside of the mother.  PREVENTION  A high risk patient needs to get frequent vaginal exams and serial ultrasounds.   Tie a suture, like a purse string, around the cervix (cerclage), before getting pregnant.  TREATMENT When CI is diagnosed early, the  treatment is a cerclage. This gives the cervix added support. The cerclage helps carry the baby to term. This is usually done before the first trimester (12 to 14 weeks). Cerclage is usually not done after the second trimester (24 weeks) unless it is an emergency. Your caregiver can discuss the risks of this procedure. The cerclage suture may be removed when labor begins or at term before labor begins. The suture can also be left in place for future pregnancies. If left in place, the baby is delivered by Cesarean section. HOME CARE INSTRUCTIONS  Keep your follow-up prenatal appointments.   Take medication as directed by your caregiver.   Avoid physical activities, exercise and sexual intercourse until you have permission from your caregiver.   Do not douche or use tampons.   Resume your usual diet.  SEEK MEDICAL CARE IF:  You develop abnormal vaginal discharge.   You have an oral temperature above 102 F (38.9 C).  SEEK IMMEDIATE MEDICAL CARE IF:  You have an oral temperature above 102 F (38.9 C), not controlled by medicine.   You develop uterine contractions.   You do not feel the baby moving or the baby is not moving as much as usual.   You pass out.   You have vaginal bleeding.   You are leaking fluid or have a gush of fluid from your vagina.   You have blood in your urine or pain when urinating.  Document Released: 11/09/2005 Document Re-Released: 02/03/2010 [employer] Patient Information 2011  [employer]. Hypertension In Pregnancy High Blood Pressure in Pregnancy You can develop high blood pressure (hypertension) in the last half of your pregnancy (20 weeks of gestation or later). This can happen even if your blood pressure was normal before pregnancy. This condition is usually monitored closely because pregnancy-induced hypertension (PIH) can be associated with more serious problems. PIH can be a sign of a complication of pregnancy called preeclampsia. This is  especially true if you have protein in your urine. If preeclampsia progresses, it can cause convulsions during your pregnancy (eclampsia). It can also become HELLP (hemolysis, elevated liver enzymes, and low platelet count) syndrome, which can lead to abnormalities of the liver, abnormalities of blood clotting, increased high blood pressure, and increased protein in the urine. Both conditions are very serious and can threaten the life of a mother and her baby. A woman who has high blood pressure before getting pregnant can also get superimposed preeclampsia, which is usually a more severe form of preeclampsia. HOME CARE INSTRUCTIONS  Make sure you understand these instructions and ask your caregiver any questions you have.   Follow your caregiver's instructions carefully.   Take medications as directed by your caregiver.  SEEK IMMEDIATE MEDICAL CARE IF YOU HAVE:  Stomach pain.   Sudden and excessive swelling in the hands, ankles, or face.   Weight gain of 4 pounds (1.8 kg) or more within 1 week.   Repeated vomiting.   Vaginal bleeding.   A lack of fetal movement.   Headache.   Vision problems (blurred or double vision).   Muscle twitching or spasm.   Shortness of breath.   Blue fingernails and lips.   Blood in the urine.  Document Released: 11/09/2005 Document Re-Released: 04/29/2010 [facility] Patient Information 2011 [place], [place].

## 2011-09-07 NOTE — Progress Notes (Signed)
  Subjective:    [name] is a 35 y.o. [id number] [redacted]w[redacted]d being seen today for her obstetrical visit.  Patient reports low abd. pain. Fetal movement: normal.  Objective:    BP 114/76  Temp 98.1 F (36.7 C)  Wt 169 lb 14.4 oz (77.066 kg)  LMP 04/16/2011  Physical Exam  Exam  FHT:  150 BPM  Uterine Size: size equals dates  Presentation: unsure     Assessment:    Pregnancy:  [id number]    Plan:    Patient Active Problem List  Diagnoses  . HCV infection  . Hypertension  . Bipolar disorder  . Bicornuate uterus  . Short cervix affecting pregnancy  . Hypertension complicating pregnancy  . Tobacco use complicating pregnancy or childbirth    Continue bedrest for now Follow up in 2 Weeks.   C/o leaking urine-seen in MAU for similar complaints with nml u/s and cervical length.

## 2011-09-10 ENCOUNTER — Encounter: Payer: Self-pay | Admitting: Obstetrics & Gynecology

## 2011-09-15 ENCOUNTER — Inpatient Hospital Stay (HOSPITAL_COMMUNITY): Payer: Medicaid Other

## 2011-09-15 ENCOUNTER — Inpatient Hospital Stay (HOSPITAL_COMMUNITY)
Admission: AD | Admit: 2011-09-15 | Discharge: 2011-09-16 | DRG: 774 | Disposition: A | Discharge status: Home / Self Care | Payer: Medicaid Other | Source: Ambulatory Visit | Attending: Obstetrics & Gynecology | Admitting: Obstetrics & Gynecology

## 2011-09-15 ENCOUNTER — Telehealth: Payer: Self-pay | Admitting: *Deleted

## 2011-09-15 ENCOUNTER — Encounter (HOSPITAL_COMMUNITY): Payer: Self-pay | Admitting: *Deleted

## 2011-09-15 DIAGNOSIS — IMO0002 Reserved for concepts with insufficient information to code with codable children: Secondary | ICD-10-CM

## 2011-09-15 DIAGNOSIS — O411090 Infection of amniotic sac and membranes, unspecified, unspecified trimester, not applicable or unspecified: Secondary | ICD-10-CM

## 2011-09-15 DIAGNOSIS — O42919 Preterm premature rupture of membranes, unspecified as to length of time between rupture and onset of labor, unspecified trimester: Secondary | ICD-10-CM

## 2011-09-15 DIAGNOSIS — O169 Unspecified maternal hypertension, unspecified trimester: Secondary | ICD-10-CM

## 2011-09-15 DIAGNOSIS — O26879 Cervical shortening, unspecified trimester: Secondary | ICD-10-CM

## 2011-09-15 DIAGNOSIS — O1002 Pre-existing essential hypertension complicating childbirth: Secondary | ICD-10-CM | POA: Diagnosis present

## 2011-09-15 DIAGNOSIS — O4290 Premature rupture of membranes, unspecified as to length of time between rupture and onset of labor, unspecified weeks of gestation: Principal | ICD-10-CM | POA: Diagnosis present

## 2011-09-15 HISTORY — DX: Bicornate uterus: Q51.3

## 2011-09-15 LAB — DIFFERENTIAL
Eosinophils Relative: 0 % (ref 0–5)
Lymphocytes Relative: 9 % — ABNORMAL LOW (ref 12–46)
Lymphs Abs: 1.8 10*3/uL (ref 0.7–4.0)
Monocytes Absolute: 1.3 10*3/uL — ABNORMAL HIGH (ref 0.1–1.0)

## 2011-09-15 LAB — CBC
HCT: 33 % — ABNORMAL LOW (ref 36.0–46.0)
Hemoglobin: 11.6 g/dL — ABNORMAL LOW (ref 12.0–15.0)
MCHC: 35.2 g/dL (ref 30.0–36.0)
MCV: 85.1 fL (ref 78.0–100.0)
RDW: 13.6 % (ref 11.5–15.5)
WBC: 21 10*3/uL — ABNORMAL HIGH (ref 4.0–10.5)

## 2011-09-15 LAB — WET PREP, GENITAL: Yeast Wet Prep HPF POC: NONE SEEN

## 2011-09-15 MED ORDER — OXYTOCIN BOLUS FROM INFUSION
500.0000 mL | Freq: Once | INTRAVENOUS | Status: DC
  Filled 2011-09-15: qty 500
  Filled 2011-09-15: qty 1000

## 2011-09-15 MED ORDER — TETANUS-DIPHTH-ACELL PERTUSSIS 5-2.5-18.5 LF-MCG/0.5 IM SUSP
0.5000 mL | Freq: Once | INTRAMUSCULAR | Status: DC
Start: 2011-09-16 — End: 2011-09-16
  Filled 2011-09-15: qty 0.5

## 2011-09-15 MED ORDER — WITCH HAZEL-GLYCERIN EX PADS: 1.0000 "application " | MEDICATED_PAD | CUTANEOUS | Status: DC | PRN

## 2011-09-15 MED ORDER — ONDANSETRON HCL 4 MG/2ML IJ SOLN: 4.0000 mg | Freq: Four times a day (QID) | INTRAMUSCULAR | Status: DC | PRN

## 2011-09-15 MED ORDER — FLEET ENEMA 7-19 GM/118ML RE ENEM: 1.0000 | ENEMA | RECTAL | Status: DC | PRN

## 2011-09-15 MED ORDER — DEXTROSE 5 % IN LACTATED RINGERS IV BOLUS: 1000.0000 mL | Freq: Once | INTRAVENOUS | Status: DC

## 2011-09-15 MED ORDER — ZOLPIDEM TARTRATE 5 MG PO TABS
5.0000 mg | ORAL_TABLET | Freq: Every evening | ORAL | Status: DC | PRN
  Administered 2011-09-15: 5 mg via ORAL
  Filled 2011-09-15: qty 1

## 2011-09-15 MED ORDER — DIPHENHYDRAMINE HCL 12.5 MG/5ML PO ELIX
12.5000 mg | ORAL_SOLUTION | Freq: Four times a day (QID) | ORAL | Status: DC | PRN
  Filled 2011-09-15: qty 5

## 2011-09-15 MED ORDER — LIDOCAINE HCL (PF) 1 % IJ SOLN
30.0000 mL | INTRAMUSCULAR | Status: DC | PRN
  Filled 2011-09-15 (×2): qty 30

## 2011-09-15 MED ORDER — SODIUM CHLORIDE 0.9 % IJ SOLN
INTRAMUSCULAR | Status: AC
  Filled 2011-09-15: qty 6

## 2011-09-15 MED ORDER — SENNOSIDES-DOCUSATE SODIUM 8.6-50 MG PO TABS
2.0000 | ORAL_TABLET | Freq: Every day | ORAL | Status: DC
  Administered 2011-09-15: 2 via ORAL

## 2011-09-15 MED ORDER — FENTANYL 10 MCG/ML IV SOLN
INTRAVENOUS | Status: DC
  Administered 2011-09-15: 17:00:00 via INTRAVENOUS
  Administered 2011-09-15: 0 ug via INTRAVENOUS
  Filled 2011-09-15: qty 50

## 2011-09-15 MED ORDER — GENTAMICIN SULFATE 40 MG/ML IJ SOLN
130.0000 mg | Freq: Three times a day (TID) | INTRAVENOUS | Status: DC
  Administered 2011-09-15 (×2): 130 mg via INTRAVENOUS
  Filled 2011-09-15 (×4): qty 3.25

## 2011-09-15 MED ORDER — HYDROXYZINE HCL 50 MG PO TABS
50.0000 mg | ORAL_TABLET | Freq: Four times a day (QID) | ORAL | Status: DC | PRN
  Filled 2011-09-15: qty 1

## 2011-09-15 MED ORDER — LACTATED RINGERS IV SOLN
INTRAVENOUS | Status: DC
  Administered 2011-09-15: 125 mL/h via INTRAVENOUS

## 2011-09-15 MED ORDER — ONDANSETRON HCL 4 MG PO TABS: 4.0000 mg | ORAL_TABLET | ORAL | Status: DC | PRN

## 2011-09-15 MED ORDER — SODIUM CHLORIDE 0.9 % IV SOLN
2.0000 g | Freq: Four times a day (QID) | INTRAVENOUS | Status: DC
  Administered 2011-09-15 – 2011-09-16 (×3): 2 g via INTRAVENOUS
  Filled 2011-09-15 (×5): qty 2000

## 2011-09-15 MED ORDER — NALBUPHINE HCL 10 MG/ML IJ SOLN
10.0000 mg | Freq: Once | INTRAMUSCULAR | Status: AC
  Administered 2011-09-15: 10 mg via INTRAMUSCULAR

## 2011-09-15 MED ORDER — IBUPROFEN 600 MG PO TABS: 600.0000 mg | ORAL_TABLET | Freq: Four times a day (QID) | ORAL | Status: DC | PRN

## 2011-09-15 MED ORDER — LACTATED RINGERS IV SOLN: 500.0000 mL | INTRAVENOUS | Status: DC | PRN

## 2011-09-15 MED ORDER — SIMETHICONE 80 MG PO CHEW: 80.0000 mg | CHEWABLE_TABLET | ORAL | Status: DC | PRN

## 2011-09-15 MED ORDER — OXYCODONE-ACETAMINOPHEN 5-325 MG PO TABS
1.0000 | ORAL_TABLET | ORAL | Status: DC | PRN
  Administered 2011-09-16: 2 via ORAL
  Administered 2011-09-16: 1 via ORAL
  Filled 2011-09-15 (×2): qty 2

## 2011-09-15 MED ORDER — DIPHENHYDRAMINE HCL 50 MG/ML IJ SOLN: 12.5000 mg | Freq: Four times a day (QID) | INTRAMUSCULAR | Status: DC | PRN

## 2011-09-15 MED ORDER — IBUPROFEN 600 MG PO TABS
600.0000 mg | ORAL_TABLET | Freq: Four times a day (QID) | ORAL | Status: DC
  Administered 2011-09-15 – 2011-09-16 (×2): 600 mg via ORAL
  Filled 2011-09-15 (×2): qty 1

## 2011-09-15 MED ORDER — OXYCODONE-ACETAMINOPHEN 5-325 MG PO TABS
2.0000 | ORAL_TABLET | ORAL | Status: DC | PRN
  Administered 2011-09-15 (×2): 2 via ORAL
  Filled 2011-09-15 (×2): qty 2

## 2011-09-15 MED ORDER — LABETALOL HCL 200 MG PO TABS
200.0000 mg | ORAL_TABLET | Freq: Two times a day (BID) | ORAL | Status: DC
  Administered 2011-09-15 – 2011-09-16 (×2): 200 mg via ORAL
  Filled 2011-09-15 (×2): qty 1

## 2011-09-15 MED ORDER — BENZOCAINE-MENTHOL 20-0.5 % EX AERO: 1.0000 "application " | INHALATION_SPRAY | CUTANEOUS | Status: DC | PRN

## 2011-09-15 MED ORDER — SODIUM CHLORIDE 0.9 % IJ SOLN: 9.0000 mL | INTRAMUSCULAR | Status: DC | PRN

## 2011-09-15 MED ORDER — DIBUCAINE 1 % RE OINT: 1.0000 "application " | TOPICAL_OINTMENT | RECTAL | Status: DC | PRN

## 2011-09-15 MED ORDER — NALOXONE HCL 0.4 MG/ML IJ SOLN: 0.4000 mg | INTRAMUSCULAR | Status: DC | PRN

## 2011-09-15 MED ORDER — CITRIC ACID-SODIUM CITRATE 334-500 MG/5ML PO SOLN: 30.0000 mL | ORAL | Status: DC | PRN

## 2011-09-15 MED ORDER — LORAZEPAM 2 MG/ML IJ SOLN
1.0000 mg | Freq: Once | INTRAMUSCULAR | Status: AC
Start: 2011-09-15 — End: 2011-09-15
  Administered 2011-09-15: 1 mg via INTRAVENOUS
  Filled 2011-09-15: qty 1

## 2011-09-15 MED ORDER — PRENATAL PLUS 27-1 MG PO TABS
1.0000 | ORAL_TABLET | Freq: Every day | ORAL | Status: DC
  Administered 2011-09-16: 1 via ORAL
  Filled 2011-09-15: qty 1

## 2011-09-15 MED ORDER — DIPHENHYDRAMINE HCL 25 MG PO CAPS: 25.0000 mg | ORAL_CAPSULE | Freq: Four times a day (QID) | ORAL | Status: DC | PRN

## 2011-09-15 MED ORDER — OXYTOCIN 20 UNITS IN LACTATED RINGERS INFUSION - SIMPLE: 125.0000 mL/h | Freq: Once | INTRAVENOUS | Status: DC

## 2011-09-15 MED ORDER — ACETAMINOPHEN 325 MG PO TABS: 650.0000 mg | ORAL_TABLET | ORAL | Status: DC | PRN

## 2011-09-15 MED ORDER — ONDANSETRON HCL 4 MG/2ML IJ SOLN: 4.0000 mg | INTRAMUSCULAR | Status: DC | PRN

## 2011-09-15 MED ORDER — LANOLIN HYDROUS EX OINT: TOPICAL_OINTMENT | CUTANEOUS | Status: DC | PRN

## 2011-09-15 MED ORDER — NALBUPHINE HCL 10 MG/ML IJ SOLN
5.0000 mg | Freq: Once | INTRAMUSCULAR | Status: AC
  Administered 2011-09-15: 5 mg via INTRAVENOUS
  Filled 2011-09-15: qty 0.5

## 2011-09-15 MED ORDER — HYDROXYZINE HCL 50 MG/ML IM SOLN
50.0000 mg | Freq: Four times a day (QID) | INTRAMUSCULAR | Status: DC | PRN
  Administered 2011-09-15: 50 mg via INTRAMUSCULAR
  Filled 2011-09-15 (×2): qty 1

## 2011-09-15 MED ORDER — NIFEDIPINE ER OSMOTIC RELEASE 30 MG PO TB24
30.0000 mg | ORAL_TABLET | Freq: Once | ORAL | Status: AC
  Administered 2011-09-15: 30 mg via ORAL
  Filled 2011-09-15: qty 1

## 2011-09-15 MED ORDER — NALBUPHINE SYRINGE 5 MG/0.5 ML
5.0000 mg | INJECTION | INTRAMUSCULAR | Status: DC | PRN
  Administered 2011-09-15: 10 mg via INTRAVENOUS
  Filled 2011-09-15 (×3): qty 1

## 2011-09-15 NOTE — Progress Notes (Signed)
Dr. Macon Large and Dr. Adrian Blackwater at bedside to explain to pt POC, and outcome of fetus, pt verbalized understandings.  Cerclage removal complete.  Will continue to monitor.

## 2011-09-15 NOTE — Telephone Encounter (Signed)
Pt left message stating that she has not felt her baby move in over 24 hrs. She is 21 wks. She wants to know if she should be worried or what to do.

## 2011-09-15 NOTE — ED Notes (Signed)
Pt also c/o aching in upper thighs since this AM;

## 2011-09-15 NOTE — H&P (Signed)
[name] is a 35 y.o. female presenting for preterm labor.  This is a 35 year old G6 P2 032 presented to the MAU at 20 weeks and 6 days with recurrent contractions. Patient had a rescue cerclage placed on 09/01/2011. She is been evaluated in the MAU several times due to leaking fluid. She presented today in the MAU with contractions and leaking fluid. She had a ultrasound performed which showed a cervical length of 1.05 cm today as well as with oligohydramnios with an AFI of 0.94. Her last ultrasound was on 1010 which showed a 3 cm cervical length with funneling of 1.1 cm and a funnel width of 1.5 cm. Her contractions started several hours ago and have become closer together and stronger.  Maternal Medical History:  Reason for admission: Reason for admission: rupture of membranes and contractions.  Reason for Admission:   nauseaPrenatal complications: Hypertension, oligohydramnios and preterm labor.   Prenatal Complications - Diabetes: none.    OB History    Grav Para Term Preterm Abortions TAB SAB Ect Mult Living   6 2 2  0 3 2 1  0 0 2     Past Medical History  Diagnosis Date  . Anxiety   . GERD (gastroesophageal reflux disease)   . STD (female)     HX of STD's  . Abnormal Pap smear 1998    had colpo  . HCV infection     HX of Hep C  . 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 Bicornuate uterus   . Depression   . Hypertension 2010  . Asthma     childhood asthma  . Bicornate uterus    Past Surgical History  Procedure Date  . Therapeutic abortion   . Therapeutic abortion   . Tonsillectomy   . Dilation and curettage of uterus     SAB  . Finger surgery 2003    reattachment of right forefinger  . Cervical cerclage 09/01/2011    Procedure: CERCLAGE CERVICAL;  Surgeon: Reva Bores, MD;  Location: [facility];  Service: Gynecology;  Laterality: N/A;   Family History: family history includes Diabetes in her paternal grandfather; Hypertension in her mother; and  Thyroid disease in her maternal grandmother. Social History:  reports that she has been smoking Cigarettes.  She has a 10 pack-year smoking history. She has never used smokeless tobacco. She reports that she does not drink alcohol or use illicit drugs.  Review of Systems  Constitutional: Negative for fever, chills and weight loss.  HENT: Negative for neck pain.   Eyes: Negative for blurred vision and double vision.  Respiratory: Negative for cough and shortness of breath.   Gastrointestinal: Negative for heartburn, nausea and vomiting.  Musculoskeletal: Negative for myalgias and back pain.  Neurological: Negative for weakness and headaches.    Dilation: Closed Effacement (%):  (feels shorter) Blood pressure 121/79, pulse 115, temperature 98.1 F (36.7 C), temperature source Axillary, resp. rate 22, height 5\' 1"  (1.549 m), weight 77.111 kg (170 lb), last menstrual period 04/16/2011, SpO2 99.00%. Maternal Exam:  Uterine Assessment: Contraction strength is moderate.  Contraction duration is 30 seconds. Contraction frequency is regular.   Introitus: Normal vulva. Normal vagina.  Ferning test: not done.  Nitrazine test: not done.  Cervix: Cervix evaluated by digital exam.     Physical Exam  Constitutional: She appears well-developed and well-nourished.  HENT:  Head: Normocephalic and atraumatic.  Neck: Normal range of motion. Neck supple.  Cardiovascular: Normal rate, regular rhythm  and normal heart sounds.   Respiratory: Effort normal and breath sounds normal. No respiratory distress. She has no wheezes. She has no rales. She exhibits no tenderness.  GI: Soft. Bowel sounds are normal. She exhibits no distension and no mass. There is no tenderness. There is no rebound and no guarding.       Fundus [redacted] week gestation size.    Prenatal labs: ABO, Rh: O/Positive/-- (07/23 0000) Antibody: Negative (07/23 0000) Rubella:   RPR: neg (07/23 0920)  HBsAg:    HIV:    GBS:      Assessment/Plan: #40 35 year old G6 P2 032 with intrauterine pregnancy at 20 weeks and 6 days #2 preterm labor #3 nonviable gestational age fetus #4 chorioamnionitis #5 chronic hypertension  Due to the chorioamnionitis and the preterm labor we will proceed to delivery. We discussed at length with the patient the ramifications of proceeding to delivery, specifically inability to resuscitate the delivered infant. The patient understood this and wishes to proceed. Cerclage was removed by Dr. Macon Large. We will continue with gentamicin and ampicillin for chorioamnionitis. We will use Cytotec for cervical ripening due to the patient cervical exam. We will continue labetalol 200 mg by mouth to control the patient's blood pressure.   Ferd Horrigan JEHIEL 09/15/2011, 4:16 PM

## 2011-09-15 NOTE — Telephone Encounter (Signed)
Pt is currently @ MAU for evaluation. Return call will not be made @ this time

## 2011-09-15 NOTE — Progress Notes (Signed)
Subjective: Feeling pressure  Objective: BP 121/79  Pulse 115  Temp(Src) 98.1 F (36.7 C) (Axillary)  Resp 20  Ht 5\' 1"  (1.549 m)  Wt 77.111 kg (170 lb)  BMI 32.12 kg/m2  SpO2 100%  LMP 04/16/2011      FHT:  previable UC:   regular, every 2-3 minutes SVE:   4-5/75%/-2  Labs: Lab Results  Component Value Date   WBC 21.0* 09/15/2011   HGB 11.6* 09/15/2011   HCT 33.0* 09/15/2011   MCV 85.1 09/15/2011   PLT 225 09/15/2011    Assessment / Plan: Spontaneous labor, progressing normally  Labor: continue expectant management.  Continue ampicillin and gentamycin.  Chalisa Kobler JEHIEL 09/15/2011, 5:02 PM

## 2011-09-15 NOTE — Consults (Signed)
ANTIBIOTIC CONSULT NOTE - INITIAL  Pharmacy Consult for Gentamicin Indication: Patient s/p rescue cerclage on 10/8 and now presenting to MAU with contractions.  No Known Allergies  Patient Measurements: Height: 5\' 1"  (154.9 cm) Weight: 170 lb (77.111 kg) IBW/kg (Calculated) : 47.8  Adjusted Body Weight: 58.3kg  Vital Signs: Temp: 98.1 F (36.7 C) (10/23 1454) Temp src: Axillary (10/23 1454) BP: 121/79 mmHg (10/23 1454) Pulse Rate: 115  (10/23 1454)  Labs:  Basename 09/15/11 1045  WBC 21.0*  HGB 11.6*  PLT 225  LABCREA --  CREATININE --  CRCLEARANCE --   No results found for this basename: GENTTROUGH:2,GENTPEAK:2,GENTRANDOM:2, in the last 72 hours   Microbiology: Recent Results (from the past 720 hour(s))  WET PREP, GENITAL     Status: Abnormal   Collection Time   08/17/11  9:24 AM      Component Value Range Status Comment   Yeast, Wet Prep FEW  NONE SEEN  Final    Trich, Wet Prep NONE SEEN  NONE SEEN  Final    Clue Cells, Wet Prep NONE SEEN  NONE SEEN  Final    WBC, Wet Prep HPF POC MANY (*) NONE SEEN  Final   WET PREP, GENITAL     Status: Abnormal   Collection Time   08/29/11 10:58 PM      Component Value Range Status Comment   Yeast, Wet Prep NONE SEEN  NONE SEEN  Final    Trich, Wet Prep NONE SEEN  NONE SEEN  Final    Clue Cells, Wet Prep FEW (*) NONE SEEN  Final    WBC, Wet Prep HPF POC FEW (*) NONE SEEN  Final MODERATE BACTERIA SEEN  WET PREP, GENITAL     Status: Abnormal   Collection Time   09/15/11 10:20 AM      Component Value Range Status Comment   Yeast, Wet Prep NONE SEEN  NONE SEEN  Final    Trich, Wet Prep NONE SEEN  NONE SEEN  Final    Clue Cells, Wet Prep NONE SEEN  NONE SEEN  Final    WBC, Wet Prep HPF POC MODERATE (*) NONE SEEN  Final FEW BACTERIA SEEN    Medications:  Ampicillin 2 grams IV every 6 hours  Assessment:   Estimated Ke = 0.316 , Vd = 19.2 L = 0.33 L/kg  Goal of Therapy:  Gentamicin peak 6-8 mg/L and Trough < 1  mg/L  Plan:  Gentamicin 130 mg IV every 8 hrs  Will check gentamicin levels if continued > 72hr or clinically indicated.  Berlin Hun D 09/15/2011,3:13 PM

## 2011-09-15 NOTE — Progress Notes (Signed)
Patient states she had a cerclage placed on 10-8 and has been having a discharge since that time, has been evaluated and is not rupture of membranes. States she started having contractions this am every 2-5 minutes. Reports no fetal movement in 24 hours, good heart tones in triage.

## 2011-09-16 MED ORDER — IBUPROFEN 600 MG PO TABS: 600.0000 mg | ORAL_TABLET | Freq: Four times a day (QID) | ORAL | Status: AC | PRN

## 2011-09-16 MED ORDER — HYDROCHLOROTHIAZIDE 25 MG PO TABS: 25.0000 mg | ORAL_TABLET | Freq: Every day | ORAL | Status: DC

## 2011-09-16 MED ORDER — DOCUSATE SODIUM 100 MG PO CAPS: 100.0000 mg | ORAL_CAPSULE | Freq: Two times a day (BID) | ORAL | Status: AC | PRN

## 2011-09-16 MED ORDER — ZOLPIDEM TARTRATE 5 MG PO TABS: 5.0000 mg | ORAL_TABLET | Freq: Every evening | ORAL | Status: DC | PRN

## 2011-09-16 MED ORDER — OXYCODONE-ACETAMINOPHEN 5-325 MG PO TABS: 1.0000 | ORAL_TABLET | ORAL | Status: AC | PRN

## 2011-09-16 NOTE — Progress Notes (Signed)
UR chart review completed.  

## 2011-09-16 NOTE — Progress Notes (Signed)
Post Partum Day 1s/p SVD of [redacted]w[redacted]d previable fetus after PPROM and chorioamnionitis  Subjective: up ad lib, voiding, tolerating PO, + flatus and appropriately emotional.  Support given to patient.  Patient is willing to talk to Child psychotherapist.  Objective: Blood pressure 113/79, pulse 79, temperature 98.4 F (36.9 C), temperature source Oral, resp. rate 19, height 5\' 1"  (1.549 m), weight 77.111 kg (170 lb), last menstrual period 04/16/2011, SpO2 98.00%, unknown if currently breastfeeding.  Physical Exam:  General: alert and no distress Lochia: appropriate Uterine Fundus: firm DVT Evaluation: No evidence of DVT seen on physical exam.   Basename 09/15/11 1045  HGB 11.6*  HCT 33.0*    Assessment/Plan: Discharge home, Social Work consult and Contraception -considering BTL; will discuss during her office visit Follow up in clinic in 1-2 weeks.   LOS: 1 day   Vernita Tague A 09/16/2011, 7:06 AM

## 2011-09-16 NOTE — Discharge Summary (Signed)
Postpartum Physician Discharge Summary  Patient ID: [name] MRN: [id number] DOB/AGE: 1976/02/28 35 y.o.  Admit date: 09/15/2011 Discharge date: 09/16/2011  Admission Diagnoses: PPROM at [redacted]w[redacted]d; chorioamnionitis  Discharge Diagnoses: s/p SVD of previable fetus  Prenatal Procedures: Removal of cerclage  Intrapartum Procedures: spontaneous vaginal delivery  Consults: Social Work  Significant Diagnostic Studies:  Lab Results  Component Value Date   WBC 21.0* 09/15/2011   HGB 11.6* 09/15/2011   HCT 33.0* 09/15/2011   MCV 85.1 09/15/2011   PLT 225 09/15/2011    Treatments: IV hydration and antibiotics: gentamycin and ampicillin  Hospital Course:  This is a [name] y.o. [phone] with IUP at [redacted]w[redacted]d admitted for PPROM.  Of note, patient had a rescue cerclage placed on 09/01/2011 for incidentally noted shortened cervix. She has since been evaluated in the MAU several times due to leaking fluid. She presented 09/15/11 in the MAU with contractions and leaking fluid. She had a ultrasound performed which showed a cervical length of 1.05 cm  as well as with oligohydramnios with an AFI of 0.94. Her last ultrasound was on 10/10 which showed a 3 cm cervical length with funneling of 1.1 cm and a funnel width of 1.5 cm. Her contractions started several hours ago and became closer together and stronger. When her labs indicated a WBC of 21, and purulent discharge was noted from her uterus; the diagnosis of chorioamnionitis was confirmed.  Patient was counseled regarding needing removal of cerclage and delivery.  The cerclage was removed, antibiotics were started and patient went on to have an uncomplicated delivery of fetus and placenta.  The fetus survived for a few hours after birth and expired.  Patient was noted to grieve appropriately, and appropriate support was given to patient.  Postpartum, patient had expected amount of lochia, no other complications. Antibiotics were  discontinued after 12 hours and no further symptoms of infection were noted.  She was tolerating oral intake, ambulating and had no other concerns.She desired discharge to home.  Discharged Condition: stable  Discharge Exam: Blood pressure 113/79, pulse 79, temperature 98.4 F (36.9 C), temperature source Oral, resp. rate 19, height 5\' 1"  (1.549 m), weight 77.111 kg (170 lb), last menstrual period 04/16/2011, SpO2 98.00%, unknown if currently breastfeeding. General appearance: alert and no distress Resp: clear to auscultation bilaterally Cardio: regular rate and rhythm GI: soft, non-tender; bowel sounds normal; no masses,  no organomegaly Pelvic: normal lochia Extremities: extremities normal, atraumatic, no cyanosis or edema and Homans sign is negative, no sign of DVT Neurologic: Grossly normal  Disposition: Home or Self Care  Discharge Orders    Future Appointments: Provider: Department: Dept Phone: Center:   09/21/2011 8:30 AM [facility] [phone] [facility]     Current Discharge Medication List    START taking these medications   Details  docusate sodium (COLACE) 100 MG capsule Take 1 capsule (100 mg total) by mouth 2 (two) times daily as needed for constipation. Qty: 30 capsule, Refills: 2    hydrochlorothiazide (HYDRODIURIL) 25 MG tablet Take 1 tablet (25 mg total) by mouth daily. Qty: 30 tablet, Refills: 3    ibuprofen (ADVIL,MOTRIN) 600 MG tablet Take 1 tablet (600 mg total) by mouth every 6 (six) hours as needed for pain. Qty: 30 tablet, Refills: 1    oxyCODONE-acetaminophen (PERCOCET) 5-325 MG per tablet Take 1-2 tablets by mouth every 3 (three) hours as needed (moderate - severe pain). Qty: 30 tablet, Refills: 0    zolpidem (  AMBIEN) 5 MG tablet Take 1 tablet (5 mg total) by mouth at bedtime as needed for sleep. Qty: 30 tablet, Refills: 1      CONTINUE these medications which have NOT CHANGED   Details  acetaminophen (TYLENOL) 325 MG  tablet Take 650 mg by mouth every 6 (six) hours as needed. For pain     famotidine (PEPCID) 20 MG tablet Take 20 mg by mouth 2 (two) times daily as needed. For heartburn       STOP taking these medications     labetalol (NORMODYNE) 200 MG tablet      prenatal vitamin w/FE, FA (PRENATAL 1 + 1) 27-1 MG TABS      progesterone (PROMETRIUM) 200 MG capsule          Signed: Heavyn Yearsley A 09/16/2011, 7:20 AM

## 2011-09-16 NOTE — Progress Notes (Signed)
Spiritual Care - Provided support to patient.  She stated that she feels she was somewhat prepared for this loss because of the events she had been experiencing.  She is sad and says she has cried some but feels everything happens for a reason.  Discussed possibility of an array of feelings of grief and the support programs available to her.  She has [facility] grief packet.    She reports good support from her family and arrangements are being made for the baby with [facility] in [place].  Encouraged her to explore grief resources as needed.  Dory Horn, Chaplain

## 2011-09-16 NOTE — Progress Notes (Signed)
Sw referral received this morning however pt declined Sw services, as per bedside RN.  Pt told RN that she has an uncle that is a Child psychotherapist and would talk to him if she needs grief counseling or resources for counseling services.  Pt discharge before Sw could attempt to see.

## 2011-09-17 ENCOUNTER — Telehealth: Payer: Self-pay | Admitting: *Deleted

## 2011-09-17 NOTE — Telephone Encounter (Signed)
Pt left message yesterday stating that she delivered her baby @ 21 wks on 09/15/11. The baby did not survive. She wants a letter to return to work.

## 2011-09-18 NOTE — Telephone Encounter (Signed)
Pt left another message yesterday requesting a return to work letter.

## 2011-09-18 NOTE — Telephone Encounter (Signed)
I returned pt call and informed her that her body still needs time to heal. Our practice recommends a minimum of 2 wks. Her appt for follow up is on 10/01/11. She can receive a return to work letter on that day provided she is doing well. Pt voiced understanding.

## 2011-09-24 DEATH — deceased

## 2011-09-29 ENCOUNTER — Encounter (HOSPITAL_COMMUNITY): Payer: Self-pay | Admitting: Student

## 2011-09-29 ENCOUNTER — Emergency Department (HOSPITAL_COMMUNITY)
Admission: EM | Admit: 2011-09-29 | Discharge: 2011-09-29 | Discharge status: Against Medical Advice | Payer: Medicaid Other | Attending: Emergency Medicine | Admitting: Emergency Medicine

## 2011-09-29 DIAGNOSIS — K219 Gastro-esophageal reflux disease without esophagitis: Secondary | ICD-10-CM | POA: Insufficient documentation

## 2011-09-29 DIAGNOSIS — H6122 Impacted cerumen, left ear: Secondary | ICD-10-CM

## 2011-09-29 DIAGNOSIS — H9190 Unspecified hearing loss, unspecified ear: Secondary | ICD-10-CM | POA: Insufficient documentation

## 2011-09-29 DIAGNOSIS — Z9889 Other specified postprocedural states: Secondary | ICD-10-CM | POA: Insufficient documentation

## 2011-09-29 DIAGNOSIS — I10 Essential (primary) hypertension: Secondary | ICD-10-CM | POA: Insufficient documentation

## 2011-09-29 DIAGNOSIS — R51 Headache: Secondary | ICD-10-CM | POA: Insufficient documentation

## 2011-09-29 DIAGNOSIS — H6120 Impacted cerumen, unspecified ear: Secondary | ICD-10-CM | POA: Insufficient documentation

## 2011-09-29 DIAGNOSIS — R110 Nausea: Secondary | ICD-10-CM | POA: Insufficient documentation

## 2011-09-29 DIAGNOSIS — F17200 Nicotine dependence, unspecified, uncomplicated: Secondary | ICD-10-CM | POA: Insufficient documentation

## 2011-09-29 DIAGNOSIS — F341 Dysthymic disorder: Secondary | ICD-10-CM | POA: Insufficient documentation

## 2011-09-29 MED ORDER — ALBUTEROL SULFATE (5 MG/ML) 0.5% IN NEBU: 5.0000 mg | INHALATION_SOLUTION | Freq: Once | RESPIRATORY_TRACT | Status: DC

## 2011-09-29 MED ORDER — IPRATROPIUM BROMIDE 0.02 % IN SOLN: 0.5000 mg | Freq: Once | RESPIRATORY_TRACT | Status: DC

## 2011-09-29 MED ORDER — DOCUSATE SODIUM 100 MG PO CAPS
100.0000 mg | ORAL_CAPSULE | Freq: Once | ORAL | Status: DC
  Filled 2011-09-29: qty 1

## 2011-09-29 NOTE — ED Notes (Signed)
Pt LDAs left intact in charting but removed prior to departure. Removed in charting for clerical reasons.

## 2011-09-29 NOTE — ED Notes (Signed)
Pt in with c/o left ear ache and pain with pain radiating down left neck. Reports onset x 1 month. Reports swelling to left side of faces.

## 2011-09-29 NOTE — ED Notes (Signed)
MD at bedside. 

## 2011-09-29 NOTE — ED Provider Notes (Cosign Needed)
History     CSN: [id number] Arrival date & time: 09/29/2011  1:59 PM   First MD Initiated Contact with Patient 09/29/11 1517      Chief Complaint  Patient presents with  . Facial Pain    (Consider location/radiation/quality/duration/timing/severity/associated sxs/prior treatment) HPI  Patient relates she's had "my ear clogged up" earlier in the summer. She relates about 6 weeks ago she was using a Q-tip in her ear and started having loss of hearing in her left ear. She states now she is having pain in her teeth on the left side she cannot hear at all out of her left ear. She denies any draining of her ear. She had some mild nausea this morning but no vomiting she denies fever sore throat rhinorrhea. She relates she just delivered a baby October 23. Nothing makes her feel worse nothing makes her feel better  Primary care physician none OB physician Dr. Debroah Loop [facility]  Past Medical History  Diagnosis Date  . Anxiety   . GERD (gastroesophageal reflux disease)   . STD (female)     HX of STD's  . Abnormal Pap smear 1998    had colpo  . HCV infection     HX of Hep C  . 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 Bicornuate uterus   . Depression   . Hypertension 2010  . Asthma     childhood asthma  . Bicornate uterus     Past Surgical History  Procedure Date  . Therapeutic abortion   . Therapeutic abortion   . Tonsillectomy   . Dilation and curettage of uterus     SAB  . Finger surgery 2003    reattachment of right forefinger  . Cervical cerclage 09/01/2011    Procedure: CERCLAGE CERVICAL;  Surgeon: Reva Bores, MD;  Location: [facility];  Service: Gynecology;  Laterality: N/A;    Family History  Problem Relation Age of Onset  . Hypertension Mother   . Thyroid disease Maternal Grandmother   . Diabetes Paternal Grandfather     History  Substance Use Topics  . Smoking status: Current Everyday Smoker -- 0.5 packs/day for 20 years    Types: Cigarettes  . Smokeless tobacco: Never Used  . Alcohol Use: No   patient states she will start working soon as her maternity leave is over  OB History    Grav Para Term Preterm Abortions TAB SAB Ect Mult Living   6 3 2  0 3 2 1  0 0 3      Review of Systems  All other systems reviewed and are negative.    Allergies  Review of patient's allergies indicates no known allergies.  Home Medications   Current Outpatient Rx  Name Route Sig Dispense Refill  . ACETAMINOPHEN 325 MG PO TABS Oral Take 650 mg by mouth every 6 (six) hours as needed. For pain     . FAMOTIDINE 20 MG PO TABS Oral Take 20 mg by mouth 2 (two) times daily as needed. For heartburn     . HYDROCHLOROTHIAZIDE 25 MG PO TABS Oral Take 1 tablet (25 mg total) by mouth daily. 30 tablet 3    BP 153/116  Pulse 89  Temp(Src) 98.7 F (37.1 C) (Oral)  Resp 20  SpO2 99%  LMP 04/16/2011  Breastfeeding? Unknown Vital signs show hypertension otherwise normal  Physical Exam  Constitutional: She is oriented to person, place, and time.  HENT:  Head:  Normocephalic and atraumatic.  Right Ear: External ear normal. No drainage, swelling or tenderness. No foreign bodies. Tympanic membrane is not injected, not scarred, not perforated, not erythematous and not bulging. No middle ear effusion. No decreased hearing is noted.  Left Ear: No drainage or swelling. Decreased hearing is noted.  Nose: Nose normal.  Mouth/Throat: Uvula is midline, oropharynx is clear and moist and mucous membranes are normal.       Left TM is totally obscured by a cerumen impaction   Eyes: Conjunctivae and EOM are normal. Pupils are equal, round, and reactive to light.  Neck: Normal range of motion. Neck supple.  Pulmonary/Chest: Effort normal.  Musculoskeletal: Normal range of motion.  Neurological: She is alert and oriented to person, place, and time.       Pt has no acute abnormality.    Skin: Skin is warm and dry.  Psychiatric: She has a  normal mood and affect. Her behavior is normal. Judgment and thought content normal.    ED Course  Procedures (including critical care time)  I had ordered left ear irrigation after placing infant Colace in the patient's left ear and letting it soak However nurse just flushed without medication. She then left telling the nurse she wanted to go smoke and she was tired of waiting.    Diagnoses that have been ruled out:  Diagnoses that are still under consideration:  Final diagnoses:  Impacted cerumen of left ear   Plan Pt left AMA   Devoria Albe, MD, FACEP   MDM          Ward Givens, MD 09/29/11 2001

## 2011-10-01 ENCOUNTER — Encounter: Payer: Self-pay | Admitting: Family Medicine

## 2011-10-01 ENCOUNTER — Ambulatory Visit (INDEPENDENT_AMBULATORY_CARE_PROVIDER_SITE_OTHER): Payer: Medicaid Other | Admitting: Obstetrics & Gynecology

## 2011-10-01 ENCOUNTER — Encounter: Payer: Self-pay | Admitting: *Deleted

## 2011-10-01 VITALS — BP 153/119 | HR 99 | Temp 97.8°F | Ht 61.0 in | Wt 164.9 lb

## 2011-10-01 DIAGNOSIS — Z9889 Other specified postprocedural states: Secondary | ICD-10-CM

## 2011-10-01 DIAGNOSIS — Z09 Encounter for follow-up examination after completed treatment for conditions other than malignant neoplasm: Secondary | ICD-10-CM

## 2011-10-01 MED ORDER — NORETHINDRONE 0.35 MG PO TABS: 1.0000 | ORAL_TABLET | Freq: Every day | ORAL | Status: DC

## 2011-10-01 NOTE — Progress Notes (Deleted)
Subjective:     Patient ID: [name], female   DOB: 22-Dec-1975, 35 y.o.   MRN: [id number]  HPI   Review of Systems     Objective:   Physical Exam     Assessment:     ***    Plan:     ***

## 2011-10-01 NOTE — Progress Notes (Signed)
Pt did not take BP meds today  

## 2011-10-01 NOTE — Progress Notes (Signed)
  Subjective:    Patient ID: [name], female    DOB: [birth year], 35 y.o.   MRN: [id number]  HPI  [name] is now about 2 1/2 weeks status post NSVD at 20 weeks due to an incompetent cervix, Prom and chorio.  She has not yet had sex but plans to soon.  We have discussed each of the non-estrogen forms of birth control.  She has declined each of them except the Micronor (80% effective rate quoted). She has no complaints of abnormal VB or fever.  Review of Systems Pap normal in 7/12  She did not take her antihypertensive today. She does agree to be referred to the [facility] clinic for BP management.  Objective:   Physical Exam        Assessment & Plan:  Post partum- stable Refer to [facility] E prescribe Micronor.

## 2011-10-02 ENCOUNTER — Telehealth: Payer: Self-pay | Admitting: *Deleted

## 2011-10-02 NOTE — Telephone Encounter (Signed)
Pt left message requesting that a record of her flu shot administration be faxed to her employer; [employer]  Attn: Chimere. The fax # [phone].   Flu consent faxed per pt request.

## 2011-10-05 ENCOUNTER — Emergency Department (HOSPITAL_COMMUNITY)
Admission: EM | Admit: 2011-10-05 | Discharge: 2011-10-05 | Disposition: A | Discharge status: Home / Self Care | Payer: Medicaid Other | Attending: Emergency Medicine | Admitting: Emergency Medicine

## 2011-10-05 DIAGNOSIS — H60399 Other infective otitis externa, unspecified ear: Secondary | ICD-10-CM | POA: Insufficient documentation

## 2011-10-05 DIAGNOSIS — Z79899 Other long term (current) drug therapy: Secondary | ICD-10-CM | POA: Insufficient documentation

## 2011-10-05 DIAGNOSIS — H6090 Unspecified otitis externa, unspecified ear: Secondary | ICD-10-CM

## 2011-10-05 DIAGNOSIS — Z9889 Other specified postprocedural states: Secondary | ICD-10-CM | POA: Insufficient documentation

## 2011-10-05 DIAGNOSIS — I10 Essential (primary) hypertension: Secondary | ICD-10-CM | POA: Insufficient documentation

## 2011-10-05 DIAGNOSIS — F341 Dysthymic disorder: Secondary | ICD-10-CM | POA: Insufficient documentation

## 2011-10-05 DIAGNOSIS — Z8619 Personal history of other infectious and parasitic diseases: Secondary | ICD-10-CM | POA: Insufficient documentation

## 2011-10-05 DIAGNOSIS — F17200 Nicotine dependence, unspecified, uncomplicated: Secondary | ICD-10-CM | POA: Insufficient documentation

## 2011-10-05 DIAGNOSIS — K219 Gastro-esophageal reflux disease without esophagitis: Secondary | ICD-10-CM | POA: Insufficient documentation

## 2011-10-05 DIAGNOSIS — H9209 Otalgia, unspecified ear: Secondary | ICD-10-CM | POA: Insufficient documentation

## 2011-10-05 DIAGNOSIS — J45909 Unspecified asthma, uncomplicated: Secondary | ICD-10-CM | POA: Insufficient documentation

## 2011-10-05 MED ORDER — AMOXICILLIN 500 MG PO CAPS: 500.0000 mg | ORAL_CAPSULE | Freq: Three times a day (TID) | ORAL | Status: DC

## 2011-10-05 MED ORDER — NEOMYCIN-POLYMYXIN-HC 3.5-10000-1 OT SUSP: 4.0000 [drp] | Freq: Three times a day (TID) | OTIC | Status: DC

## 2011-10-05 NOTE — ED Notes (Signed)
Pt states "I was seen on Friday afternoon, I waited 6 hrs, they were supposed to be getting something to put in my ear, she flushed it with saline, it's stopped up, I've bought some debrox and have been using saline @ home"

## 2011-10-05 NOTE — ED Notes (Signed)
Pt also states "I just got off a 12 hr shift & haven't taken my b/p med yet"

## 2011-10-05 NOTE — ED Provider Notes (Addendum)
History     CSN: [id number] Arrival date & time: 10/05/2011  7:44 AM   First MD Initiated Contact with Patient 10/05/11 0757      Chief Complaint  Patient presents with  . Otalgia    (Consider location/radiation/quality/duration/timing/severity/associated sxs/prior treatment) Patient is a 35 y.o. female presenting with ear pain. The history is provided by the patient. No language interpreter was used.  Otalgia This is a chronic problem. The current episode started more than 1 week ago (8 weeks since she stuck a Qtip in her left ear). There is pain in the left ear. The problem occurs constantly. The problem has not changed since onset.There has been no fever. The pain is at a severity of 9/10. The pain is severe. Pertinent negatives include no ear discharge, no headaches, no rhinorrhea, no sore throat, no abdominal pain, no diarrhea, no vomiting, no neck pain, no cough and no rash. Her past medical history does not include chronic ear infection, hearing loss or tympanostomy tube.  It sounds muffled in the left ear.  States she could not wait around because it took so long to be seen last time  Past Medical History  Diagnosis Date  . Anxiety   . GERD (gastroesophageal reflux disease)   . STD (female)     HX of STD's  . Abnormal Pap smear 1998    had colpo  . HCV infection     HX of Hep C  . 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 Bicornuate uterus   . Depression   . Hypertension 2010  . Asthma     childhood asthma  . Bicornate uterus     Past Surgical History  Procedure Date  . Therapeutic abortion   . Therapeutic abortion   . Tonsillectomy   . Dilation and curettage of uterus     SAB  . Finger surgery 2003    reattachment of right forefinger  . Cervical cerclage 09/01/2011    Procedure: CERCLAGE CERVICAL;  Surgeon: Reva Bores, MD;  Location: [facility];  Service: Gynecology;  Laterality: N/A;    Family History  Problem Relation Age of Onset  .  Hypertension Mother   . Thyroid disease Maternal Grandmother   . Diabetes Paternal Grandfather     History  Substance Use Topics  . Smoking status: Current Everyday Smoker -- 0.5 packs/day for 20 years    Types: Cigarettes  . Smokeless tobacco: Never Used  . Alcohol Use: No    OB History    Grav Para Term Preterm Abortions TAB SAB Ect Mult Living   6 3 2  0 3 2 1  0 0 3      Review of Systems  Constitutional: Negative for activity change.  HENT: Positive for ear pain. Negative for nosebleeds, congestion, sore throat, facial swelling, rhinorrhea, neck pain, tinnitus and ear discharge.   Eyes: Negative for discharge.  Respiratory: Negative for cough.   Cardiovascular: Negative for chest pain.  Gastrointestinal: Negative for vomiting, abdominal pain, diarrhea and abdominal distention.  Genitourinary: Negative for difficulty urinating.  Musculoskeletal: Negative for arthralgias.  Skin: Negative for rash.  Neurological: Negative for headaches.  Hematological: Negative.   Psychiatric/Behavioral: Negative.     Allergies  Review of patient's allergies indicates no known allergies.  Home Medications   Current Outpatient Rx  Name Route Sig Dispense Refill  . ACETAMINOPHEN 325 MG PO TABS Oral Take 650 mg by mouth every 6 (six) hours as needed. For pain     .  FAMOTIDINE 20 MG PO TABS Oral Take 20 mg by mouth 2 (two) times daily as needed. For heartburn     . HYDROCHLOROTHIAZIDE 25 MG PO TABS Oral Take 1 tablet (25 mg total) by mouth daily. 30 tablet 3  . NORETHINDRONE 0.35 MG PO TABS Oral Take 1 tablet (0.35 mg total) by mouth daily. 1 Package 11    BP 147/110  Pulse 89  Temp(Src) 98.5 F (36.9 C) (Oral)  Resp 17  Wt 164 lb (74.39 kg)  SpO2 99%  Physical Exam  Constitutional: She is oriented to person, place, and time. She appears well-developed and well-nourished.  HENT:  Head: Normocephalic and atraumatic.  Right Ear: Hearing, tympanic membrane, external ear and ear  canal normal.  Left Ear: External ear normal. No tenderness. Tympanic membrane is scarred. Tympanic membrane is not erythematous and not retracted.  Mouth/Throat: No oropharyngeal exudate.       Debris and swelling in the left canal cw otitis externa  Eyes: EOM are normal. Pupils are equal, round, and reactive to light. Right eye exhibits no discharge. Left eye exhibits no discharge.  Neck: Normal range of motion. Neck supple.  Cardiovascular: Normal rate and regular rhythm.   Pulmonary/Chest: Effort normal and breath sounds normal. No respiratory distress.  Abdominal: Soft. Bowel sounds are normal. She exhibits no distension. There is no tenderness.  Musculoskeletal: She exhibits no edema.  Neurological: She is alert and oriented to person, place, and time.  Skin: Skin is warm and dry.  Psychiatric: She has a normal mood and affect.    ED Course  Procedures (including critical care time)  Labs Reviewed - No data to display No results found.   No diagnosis found.    MDM  Patient informed with the chronicity of symptoms she will need to follow up with an ENT physician and audiologist and to take all medication.  She will need to use barrier contraception (condoms) while on antibiotics to prevent pregnancy patient verbalizes understanding and agrees to follow up with the specialist.  Patient's nurse (HSW) present during counseling.          Jasmine Awe, MD 10/05/11 0815  Audreyanna Butkiewicz K Indea Dearman-Rasch, MD 10/05/11 1610  Jasmine Awe, MD 10/05/11 [phone]

## 2011-10-14 ENCOUNTER — Encounter (HOSPITAL_COMMUNITY): Payer: Self-pay | Admitting: *Deleted

## 2011-10-14 ENCOUNTER — Inpatient Hospital Stay (HOSPITAL_COMMUNITY)
Admission: AD | Admit: 2011-10-14 | Discharge: 2011-10-14 | Disposition: A | Discharge status: Home / Self Care | Payer: Medicaid Other | Source: Ambulatory Visit | Attending: Obstetrics & Gynecology | Admitting: Obstetrics & Gynecology

## 2011-10-14 DIAGNOSIS — IMO0001 Reserved for inherently not codable concepts without codable children: Secondary | ICD-10-CM | POA: Insufficient documentation

## 2011-10-14 DIAGNOSIS — R109 Unspecified abdominal pain: Secondary | ICD-10-CM | POA: Insufficient documentation

## 2011-10-14 DIAGNOSIS — I10 Essential (primary) hypertension: Secondary | ICD-10-CM

## 2011-10-14 HISTORY — DX: Chlamydial infection, unspecified: A74.9

## 2011-10-14 HISTORY — DX: Headache: R51

## 2011-10-14 LAB — COMPREHENSIVE METABOLIC PANEL
Albumin: 3.7 g/dL (ref 3.5–5.2)
BUN: 8 mg/dL (ref 6–23)
Calcium: 10.2 mg/dL (ref 8.4–10.5)
Creatinine, Ser: 0.51 mg/dL (ref 0.50–1.10)
Total Bilirubin: 0.3 mg/dL (ref 0.3–1.2)
Total Protein: 6.6 g/dL (ref 6.0–8.3)

## 2011-10-14 LAB — CBC
HCT: 38.2 % (ref 36.0–46.0)
Hemoglobin: 13.1 g/dL (ref 12.0–15.0)
MCHC: 34.3 g/dL (ref 30.0–36.0)
MCV: 85.5 fL (ref 78.0–100.0)
WBC: 7 10*3/uL (ref 4.0–10.5)

## 2011-10-14 LAB — URINE MICROSCOPIC-ADD ON

## 2011-10-14 LAB — URINALYSIS, ROUTINE W REFLEX MICROSCOPIC
Bilirubin Urine: NEGATIVE
Glucose, UA: NEGATIVE mg/dL
Specific Gravity, Urine: 1.01 (ref 1.005–1.030)

## 2011-10-14 LAB — WET PREP, GENITAL
Trich, Wet Prep: NONE SEEN
Yeast Wet Prep HPF POC: NONE SEEN

## 2011-10-14 LAB — LACTATE DEHYDROGENASE: LDH: 126 U/L (ref 94–250)

## 2011-10-14 LAB — URIC ACID: Uric Acid, Serum: 4.5 mg/dL (ref 2.4–7.0)

## 2011-10-14 NOTE — ED Provider Notes (Signed)
History   Pt presents today c/o a throbbing pain that comes and goes in her pelvis. She delivered a 21wk infant on 10/23 and states she was released for sexual activity on 11/8. However, she states she had intercourse prior to that time and she is worried that she may have damaged "something." She denies vag dc, bleeding, fever, dysuria, or any other sx at this time. She denies HA, blurry vision, or RUQ pain. She has CHTN and did not take her BP meds this morning.  Chief Complaint  Patient presents with  . Abdominal Pain   HPI  OB History    Grav Para Term Preterm Abortions TAB SAB Ect Mult Living   6 3 2 1 3 2 1  0 0 2      Past Medical History  Diagnosis Date  . Anxiety   . GERD (gastroesophageal reflux disease)   . STD (female)     HX of STD's  . Abnormal Pap smear 1998    had colpo  . HCV infection     HX of Hep C  . 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 Bicornuate uterus   . Depression   . Hypertension 2010  . Asthma     childhood asthma  . Bicornate uterus     Past Surgical History  Procedure Date  . Therapeutic abortion   . Therapeutic abortion   . Tonsillectomy   . Dilation and curettage of uterus     SAB  . Finger surgery 2003    reattachment of right forefinger  . Cervical cerclage 09/01/2011    Procedure: CERCLAGE CERVICAL;  Surgeon: Reva Bores, MD;  Location: [facility];  Service: Gynecology;  Laterality: N/A;    Family History  Problem Relation Age of Onset  . Hypertension Mother   . Thyroid disease Maternal Grandmother   . Diabetes Paternal Grandfather     History  Substance Use Topics  . Smoking status: Current Everyday Smoker -- 0.5 packs/day for 20 years    Types: Cigarettes  . Smokeless tobacco: Never Used  . Alcohol Use: No    Allergies: No Known Allergies  Prescriptions prior to admission  Medication Sig Dispense Refill  . acetaminophen (TYLENOL) 325 MG tablet Take 650 mg by mouth every 6 (six) hours as needed. For pain       . amoxicillin (AMOXIL) 500 MG capsule Take 1 capsule (500 mg total) by mouth 3 (three) times daily.  21 capsule  0  . famotidine (PEPCID) 20 MG tablet Take 20 mg by mouth 2 (two) times daily as needed. For heartburn       . hydrochlorothiazide (HYDRODIURIL) 25 MG tablet Take 1 tablet (25 mg total) by mouth daily.  30 tablet  3  . neomycin-polymyxin-hydrocortisone (CORTISPORIN) 3.5-10000-1 otic suspension Place 4 drops into the left ear 3 (three) times daily.  10 mL  0  . norethindrone (ORTHO MICRONOR) 0.35 MG tablet Take 1 tablet (0.35 mg total) by mouth daily.  1 Package  11    Review of Systems  Constitutional: Negative for fever.  Eyes: Negative for blurred vision.  Cardiovascular: Negative for chest pain and palpitations.  Gastrointestinal: Positive for abdominal pain. Negative for nausea, vomiting, diarrhea and constipation.  Genitourinary: Negative for dysuria, urgency, frequency and hematuria.  Neurological: Negative for dizziness and headaches.  Psychiatric/Behavioral: Negative for depression and suicidal ideas.   Physical Exam   Blood pressure 145/104, pulse 76, temperature 98.4 F (36.9 C), temperature  source Oral, resp. rate 20, height 5' 2.5" (1.588 m), weight 168 lb (76.204 kg).  Physical Exam  Nursing note and vitals reviewed. Constitutional: She is oriented to person, place, and time. She appears well-developed and well-nourished. No distress.  HENT:  Head: Normocephalic and atraumatic.  Eyes: EOM are normal. Pupils are equal, round, and reactive to light.  GI: Soft. She exhibits no distension. There is tenderness. There is no rebound and no guarding.  Genitourinary: No bleeding around the vagina. No vaginal discharge found.       Cervix Lg/closed. Uterus NL size and shape. No adnexal masses. Pt slightly tender over uterus.  Neurological: She is alert and oriented to person, place, and time.  Skin: Skin is warm and dry. She is not diaphoretic.  Psychiatric: She  has a normal mood and affect. Her behavior is normal. Judgment and thought content normal.    MAU Course  Procedures  Wet prep and GC/Chlamydia cultures done.  Results for orders placed during the hospital encounter of 10/14/11 (from the past 24 hour(s))  WET PREP, GENITAL     Status: Abnormal   Collection Time   10/14/11  8:30 AM      Component Value Range   Yeast, Wet Prep NONE SEEN  NONE SEEN    Trich, Wet Prep NONE SEEN  NONE SEEN    Clue Cells, Wet Prep FEW (*) NONE SEEN    WBC, Wet Prep HPF POC FEW (*) NONE SEEN   URINALYSIS, ROUTINE W REFLEX MICROSCOPIC     Status: Abnormal   Collection Time   10/14/11  9:14 AM      Component Value Range   Color, Urine YELLOW  YELLOW    Appearance CLEAR  CLEAR    Specific Gravity, Urine 1.010  1.005 - 1.030    pH 7.0  5.0 - 8.0    Glucose, UA NEGATIVE  NEGATIVE (mg/dL)   Hgb urine dipstick SMALL (*) NEGATIVE    Bilirubin Urine NEGATIVE  NEGATIVE    Ketones, ur NEGATIVE  NEGATIVE (mg/dL)   Protein, ur NEGATIVE  NEGATIVE (mg/dL)   Urobilinogen, UA 0.2  0.0 - 1.0 (mg/dL)   Nitrite NEGATIVE  NEGATIVE    Leukocytes, UA TRACE (*) NEGATIVE   URINE MICROSCOPIC-ADD ON     Status: Abnormal   Collection Time   10/14/11  9:14 AM      Component Value Range   Squamous Epithelial / LPF FEW (*) RARE    WBC, UA 3-6  <3 (WBC/hpf)   RBC / HPF 3-6  <3 (RBC/hpf)   Bacteria, UA RARE  RARE   CBC     Status: Normal   Collection Time   10/14/11  9:20 AM      Component Value Range   WBC 7.0  4.0 - 10.5 (K/uL)   RBC 4.47  3.87 - 5.11 (MIL/uL)   Hemoglobin 13.1  12.0 - 15.0 (g/dL)   HCT 16.1  09.6 - 04.5 (%)   MCV 85.5  78.0 - 100.0 (fL)   MCH 29.3  26.0 - 34.0 (pg)   MCHC 34.3  30.0 - 36.0 (g/dL)   RDW 40.9  81.1 - 91.4 (%)   Platelets 230  150 - 400 (K/uL)  COMPREHENSIVE METABOLIC PANEL     Status: Normal   Collection Time   10/14/11  9:20 AM      Component Value Range   Sodium 135  135 - 145 (mEq/L)   Potassium 4.0  3.5 - 5.1 (  mEq/L)    Chloride 101  96 - 112 (mEq/L)   CO2 24  19 - 32 (mEq/L)   Glucose, Bld 93  70 - 99 (mg/dL)   BUN 8  6 - 23 (mg/dL)   Creatinine, Ser 8.46  0.50 - 1.10 (mg/dL)   Calcium 96.2  8.4 - 10.5 (mg/dL)   Total Protein 6.6  6.0 - 8.3 (g/dL)   Albumin 3.7  3.5 - 5.2 (g/dL)   AST 19  0 - 37 (U/L)   ALT 17  0 - 35 (U/L)   Alkaline Phosphatase 79  39 - 117 (U/L)   Total Bilirubin 0.3  0.3 - 1.2 (mg/dL)   GFR calc non Af Amer >90  >90 (mL/min)   GFR calc Af Amer >90  >90 (mL/min)  LACTATE DEHYDROGENASE     Status: Normal   Collection Time   10/14/11  9:20 AM      Component Value Range   LD 126  94 - 250 (U/L)  URIC ACID     Status: Normal   Collection Time   10/14/11  9:20 AM      Component Value Range   Uric Acid, Serum 4.5  2.4 - 7.0 (mg/dL)     Assessment and Plan  CHTN: pt has not taken her BP meds today. No evidence of pp preeclampsia.  Advised pt to do so immediately and f/u with her PCP.  Abd pain: pt likely getting ready to have first menses since delivery. Discussed diet, activity, risks, and precautions.   Clinton Gallant. Ehan Freas III, DrHSc, MPAS, PA-C  10/14/2011, 9:25 AM   Henrietta Hoover, PA 10/14/11 1039

## 2011-10-14 NOTE — Progress Notes (Signed)
Delivered Oct 23, PT del at 21 wks, released to work 11/08, having throbbing pain in suprapubic pain, shooting pain down legs.  Was released for sexual activity for 11/08, actually had earlier 11/04, no painful during, but is after.Marland Kitchen  abd bloating.  Bleeding stopped 11/11.

## 2011-10-15 LAB — GC/CHLAMYDIA PROBE AMP, GENITAL
Chlamydia, DNA Probe: NEGATIVE
GC Probe Amp, Genital: NEGATIVE

## 2011-11-24 NOTE — L&D Delivery Note (Signed)
Delivery Note Progressed quickly from 5-10 cm and pushed well.  At 4:22 PM a non-viable female was delivered via Vaginal, Breech (Presentation: Double Footling Breech ;  ).  APGAR: 1/1 , ; weight .   Placenta status: Delivered spontaneously and grossly intact, .   Baby had agonal respirations and slow heartbeat.  Placenta delivered rapidly.  Purulent material surrounded both.  Anesthesia:  IV Fentanyl Episiotomy: None Lacerations: None Suture Repair: n/a Est. Blood Loss (mL): <50  Mom to [employer].  Baby to NA.  Wynelle Bourgeois 09/02/2012, 4:48 PM

## 2012-01-17 ENCOUNTER — Emergency Department (HOSPITAL_COMMUNITY): Payer: Medicaid Other

## 2012-01-17 ENCOUNTER — Other Ambulatory Visit: Payer: Self-pay

## 2012-01-17 ENCOUNTER — Encounter (HOSPITAL_COMMUNITY): Payer: Self-pay | Admitting: *Deleted

## 2012-01-17 ENCOUNTER — Emergency Department (HOSPITAL_COMMUNITY)
Admission: EM | Admit: 2012-01-17 | Discharge: 2012-01-18 | Disposition: A | Discharge status: Home / Self Care | Payer: Medicaid Other | Attending: Emergency Medicine | Admitting: Emergency Medicine

## 2012-01-17 DIAGNOSIS — R0602 Shortness of breath: Secondary | ICD-10-CM | POA: Insufficient documentation

## 2012-01-17 DIAGNOSIS — I10 Essential (primary) hypertension: Secondary | ICD-10-CM | POA: Insufficient documentation

## 2012-01-17 DIAGNOSIS — F17200 Nicotine dependence, unspecified, uncomplicated: Secondary | ICD-10-CM | POA: Insufficient documentation

## 2012-01-17 DIAGNOSIS — R079 Chest pain, unspecified: Secondary | ICD-10-CM | POA: Insufficient documentation

## 2012-01-17 DIAGNOSIS — Z8619 Personal history of other infectious and parasitic diseases: Secondary | ICD-10-CM | POA: Insufficient documentation

## 2012-01-17 MED ORDER — ASPIRIN 81 MG PO CHEW
324.0000 mg | CHEWABLE_TABLET | Freq: Once | ORAL | Status: AC
  Administered 2012-01-17: 324 mg via ORAL
  Filled 2012-01-17: qty 4

## 2012-01-17 NOTE — ED Notes (Signed)
Pt in c/o hypertension, states she checked it at work and it was elevated, when asking pt what made her check her BP she stated she developed right arm pain and chest pain, also shortness of breath so she checked her BP, pt has not had BP medication since last monday

## 2012-01-18 LAB — COMPREHENSIVE METABOLIC PANEL
ALT: 17 U/L (ref 0–35)
AST: 19 U/L (ref 0–37)
CO2: 23 mEq/L (ref 19–32)
Chloride: 101 mEq/L (ref 96–112)
Creatinine, Ser: 0.59 mg/dL (ref 0.50–1.10)
GFR calc non Af Amer: >90 mL/min (ref >90)
Glucose, Bld: 87 mg/dL (ref 70–99)
Total Bilirubin: 0.2 mg/dL — ABNORMAL LOW (ref 0.3–1.2)

## 2012-01-18 LAB — CBC
MCH: 27.8 pg (ref 26.0–34.0)
MCHC: 34.6 g/dL (ref 30.0–36.0)
MCV: 80.5 fL (ref 78.0–100.0)
Platelets: 250 10*3/uL (ref 150–400)
RBC: 5.28 MIL/uL — ABNORMAL HIGH (ref 3.87–5.11)

## 2012-01-18 LAB — CARDIAC PANEL(CRET KIN+CKTOT+MB+TROPI): Relative Index: 2.2 (ref 0.0–2.5)

## 2012-01-18 MED ORDER — HYDROCHLOROTHIAZIDE 25 MG PO TABS: 25.0000 mg | ORAL_TABLET | Freq: Every day | ORAL | Status: DC

## 2012-01-18 NOTE — Discharge Instructions (Signed)
RESOURCE GUIDE  Dental Problems  Patients with Medicaid: [facility]                     [facility] [address]                                           [address] Phone:  [phone]                                                  Phone:  [phone]  If unable to pay or uninsured, contact:  [facility] or [facility] to become qualified for the adult dental clinic.  Chronic Pain Problems Contact [facility]  [phone] Patients need to be referred by their primary care doctor.  Insufficient Money for Medicine Contact [employer]:  call "211" or [facility] 271-5999.  No Primary Care Doctor Call [facility]  [phone] Other agencies that provide inexpensive medical care    [facility]  [phone]    [facility]  [phone]    [facility]  [phone]    Women[facility] Clinic  [phone]    [facility]  [phone]    [facility]  [phone]  Psychological Services [facility]  [phone] [facility]  [phone] [facility]   [phone] (emergency services [phone])  Substance Abuse Resources [facility]  336-882-2125 [facility] 336-784-9470 [facility] 336-285-9073 [facility] 336-845-3988 Residential & Outpatient Substance Abuse Program  800-659-3381  Abuse/Neglect [place] Child Abuse Hotline (336) 641-3795 [place] [facility] Abuse Hotline 800-378-5315 (After Hours)  Emergency Shelter [facility] (336) 271-5985  Maternity Homes [facility] (336) 275-9566 [facility] (704) 372-4663  MRSA Hotline #:   832-7006    [place] Resources  [facility] [place] [facility]     [employer]                          [facility] [address] [place]                       [address]      [address]  [place]                                                [place]                            [place] Phone:  [phone]                                   Phone:  [phone]                 Phone:  [phone]  [facility] Phone:  [phone]    [facility] Child Abuse Hotline 806-496-9976 825-124-2061 (After Hours)     Hypertension Information As your heart beats, it forces blood through your arteries. This force is your blood pressure. If the pressure is too high, it is called hypertension (HTN) or high blood pressure. HTN is dangerous because you may have it and not know it. High blood pressure may mean that your heart has to work harder to pump blood. Your arteries may be narrow or stiff. The extra work puts you at risk for heart disease, stroke, and other problems.  Blood pressure consists of two numbers, a higher number over a lower, 110/72, for example. It is stated as "110 over 72." The ideal is below 120 for the top number (systolic) and under 80 for the bottom (diastolic).  You should pay close attention to your blood pressure if you have certain conditions such as:  Heart failure.   Prior heart attack.   Diabetes   Chronic kidney disease.   Prior stroke.   Multiple risk factors for heart disease.  To see if you have HTN, your blood pressure should be measured while you are seated with your arm held at the level of the heart. It should be measured at least twice. A one-time elevated blood pressure reading (especially in the Emergency Department) does not mean that you need treatment. There may be conditions in which the blood pressure is different between your right and left arms. It is important to see your caregiver soon for a recheck. Most people have essential hypertension which means that there is not a specific cause. This type of high blood pressure may be lowered by changing lifestyle factors such as:  Stress.    Smoking.   Lack of exercise.   Excessive weight.   Drug/tobacco/alcohol use.   Eating less salt.  Most people do not have symptoms from high blood pressure until it has caused damage to the body. Effective treatment can often prevent, delay or reduce that damage. TREATMENT  Treatment for high blood pressure, when a cause has been identified, is directed at the cause. There are a large number of medications to treat HTN. These fall into several categories, and your caregiver will help you select the medicines that are best for you. Medications may have side effects. You should review side effects with your caregiver. If your blood pressure stays high after you have made lifestyle changes or started on medicines,   Your medication(s) may need to be changed.   Other problems may need to be addressed.   Be certain you understand your prescriptions, and know how and when to take your medicine.   Be sure to follow up with your caregiver within the time frame advised (usually within two weeks) to have your blood pressure rechecked and to review your medications.   If you are taking more than one medicine to lower your blood pressure, make sure you know how and at what times they should be taken. Taking two medicines at the same time can result in blood pressure that is too low.  Document Released: 01/12/2006 Document Revised: 07/22/2011 Document Reviewed: 01/19/2008 [facility] Patient Information 2012 [place], [place].

## 2012-01-18 NOTE — ED Provider Notes (Signed)
History     CSN: [id number]  Arrival date & time 01/17/12  2232   First MD Initiated Contact with Patient 01/17/12 2258      Chief Complaint  Patient presents with  . Chest Pain  . Hypertension    (Consider location/radiation/quality/duration/timing/severity/associated sxs/prior treatment) Patient is a 36 y.o. female presenting with hypertension. The history is provided by the patient.  Hypertension Associated symptoms include chest pain and shortness of breath. Pertinent negatives include no abdominal pain and no headaches.   patient was diagnosed with hypertension with her last pregnancy. She has been taking HCTZ 25 mg the last few months until she recently ran out. She checked her blood pressure tonight at work and noticed it was elevated and presents here for evaluation. She is requesting prescription. She has multiple other complaints including some substernal chest pain going on throughout the day more so tonight when when she checked her blood pressure. She is still anxious admits to some shortness of breath. The symptoms are mild. Pain described as substernal pressure and not radiating. No neck or arm pain. She does have some right arm numbness but no discomfort. No weakness or headache. Patient is also requesting a pregnancy test. She denies any vaginal bleeding or discharge. She currently desires term stability before 4 or prescription if one was prescribed.  Past Medical History  Diagnosis Date  . Anxiety   . GERD (gastroesophageal reflux disease)   . STD (female)     HX of STD's  . Abnormal Pap smear 1998    had colpo  . HCV infection     HX of Hep C  . 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 Bicornuate uterus   . Depression   . Hypertension 2010  . Asthma     childhood asthma  . Bicornate uterus   . Headache   . Chlamydia 2000    Past Surgical History  Procedure Date  . Therapeutic abortion   . Therapeutic abortion   . Tonsillectomy   . Dilation  and curettage of uterus     SAB  . Finger surgery 2003    reattachment of right forefinger  . Cervical cerclage 09/01/2011    Procedure: CERCLAGE CERVICAL;  Surgeon: Reva Bores, MD;  Location: [facility];  Service: Gynecology;  Laterality: N/A;    Family History  Problem Relation Age of Onset  . Hypertension Mother   . Thyroid disease Maternal Grandmother   . Diabetes Paternal Grandfather   . Anesthesia problems Neg Hx     History  Substance Use Topics  . Smoking status: Current Everyday Smoker -- 0.5 packs/day for 20 years    Types: Cigarettes  . Smokeless tobacco: Never Used  . Alcohol Use: No    OB History    Grav Para Term Preterm Abortions TAB SAB Ect Mult Living   6 3 2 1 3 2 1  0 0 2      Review of Systems  Constitutional: Negative for fever and chills.  HENT: Negative for neck pain and neck stiffness.   Eyes: Negative for pain.  Respiratory: Positive for shortness of breath.   Cardiovascular: Positive for chest pain.  Gastrointestinal: Negative for nausea, vomiting and abdominal pain.  Genitourinary: Negative for dysuria, vaginal bleeding and vaginal discharge.  Musculoskeletal: Negative for back pain.  Skin: Negative for rash.  Neurological: Negative for headaches.  All other systems reviewed and are negative.    Allergies  Review of patient's allergies  indicates no known allergies.  Home Medications   Current Outpatient Rx  Name Route Sig Dispense Refill  . ACETAMINOPHEN 325 MG PO TABS Oral Take 650 mg by mouth every 6 (six) hours as needed. For pain     . FAMOTIDINE 20 MG PO TABS Oral Take 20 mg by mouth 2 (two) times daily as needed. For heartburn     . HYDROCHLOROTHIAZIDE 25 MG PO TABS Oral Take 1 tablet (25 mg total) by mouth daily. 30 tablet 3  . NORETHINDRONE 0.35 MG PO TABS Oral Take 1 tablet (0.35 mg total) by mouth daily. 1 Package 11    BP 182/128  Pulse 82  Resp 16  SpO2 100%  LMP 12/26/2011  Physical Exam  Constitutional: She is  oriented to person, place, and time. She appears well-developed and well-nourished.  HENT:  Head: Normocephalic and atraumatic.  Eyes: Conjunctivae and EOM are normal. Pupils are equal, round, and reactive to light.  Neck: Trachea normal. Neck supple. No thyromegaly present.  Cardiovascular: Normal rate, regular rhythm, S1 normal, S2 normal and normal pulses.     No systolic murmur is present   No diastolic murmur is present  Pulses:      Radial pulses are 2+ on the right side, and 2+ on the left side.  Pulmonary/Chest: Effort normal and breath sounds normal. She has no wheezes. She has no rhonchi. She has no rales. She exhibits no tenderness.  Abdominal: Soft. Normal appearance and bowel sounds are normal. There is no tenderness. There is no CVA tenderness and negative Murphy's sign.  Musculoskeletal:       BLE:s Calves nontender, no cords or erythema, negative Homans sign  Neurological: She is alert and oriented to person, place, and time. She has normal strength. No cranial nerve deficit or sensory deficit. GCS eye subscore is 4. GCS verbal subscore is 5. GCS motor subscore is 6.  Skin: Skin is warm and dry. No rash noted. She is not diaphoretic.  Psychiatric: Her speech is normal.       Cooperative and appropriate    ED Course  Procedures (including critical care time)  Results for orders placed during the hospital encounter of 01/17/12  CBC      Component Value Range   WBC 10.4  4.0 - 10.5 (K/uL)   RBC 5.28 (*) 3.87 - 5.11 (MIL/uL)   Hemoglobin 14.7  12.0 - 15.0 (g/dL)   HCT 16.1  09.6 - 04.5 (%)   MCV 80.5  78.0 - 100.0 (fL)   MCH 27.8  26.0 - 34.0 (pg)   MCHC 34.6  30.0 - 36.0 (g/dL)   RDW 40.9  81.1 - 91.4 (%)   Platelets 250  150 - 400 (K/uL)  CARDIAC PANEL(CRET KIN+CKTOT+MB+TROPI)      Component Value Range   Total CK 111  7 - 177 (U/L)   CK, MB 2.4  0.3 - 4.0 (ng/mL)   Troponin I <0.30  <0.30 (ng/mL)   Relative Index 2.2  0.0 - 2.5   COMPREHENSIVE METABOLIC PANEL        Component Value Range   Sodium 135  135 - 145 (mEq/L)   Potassium 3.9  3.5 - 5.1 (mEq/L)   Chloride 101  96 - 112 (mEq/L)   CO2 23  19 - 32 (mEq/L)   Glucose, Bld 87  70 - 99 (mg/dL)   BUN 11  6 - 23 (mg/dL)   Creatinine, Ser 7.82  0.50 - 1.10 (mg/dL)   Calcium  10.5  8.4 - 10.5 (mg/dL)   Total Protein 7.7  6.0 - 8.3 (g/dL)   Albumin 4.4  3.5 - 5.2 (g/dL)   AST 19  0 - 37 (U/L)   ALT 17  0 - 35 (U/L)   Alkaline Phosphatase 57  39 - 117 (U/L)   Total Bilirubin 0.2 (*) 0.3 - 1.2 (mg/dL)   GFR calc non Af Amer >90  >90 (mL/min)   GFR calc Af Amer >90  >90 (mL/min)  PREGNANCY, URINE      Component Value Range   Preg Test, Ur NEGATIVE  NEGATIVE     Date: 01/18/2012  Rate: 89  Rhythm: normal sinus rhythm  QRS Axis: left  Intervals: normal  ST/T Wave abnormalities: nonspecific ST changes  Conduction Disutrbances:none  Narrative Interpretation:   Old EKG Reviewed: unchanged    MDM  Elevated blood pressure. Patient with multiple other complaints including anxiety. Blood pressure is elevated today. Workup as above. On recheck her exam is unchanged and she no longer has chest pain, shortness of breath and anxiety is improved. Plan restart blood pressure medications. Primary care referral provided. Strict return precautions verbalized as understood.        Sunnie Nielsen, MD 01/19/12 1901

## 2012-04-29 ENCOUNTER — Encounter (HOSPITAL_COMMUNITY): Payer: Self-pay | Admitting: *Deleted

## 2012-04-29 ENCOUNTER — Inpatient Hospital Stay (HOSPITAL_COMMUNITY)
Admission: AD | Admit: 2012-04-29 | Discharge: 2012-04-30 | Disposition: A | Discharge status: Home / Self Care | Payer: Medicaid Other | Source: Ambulatory Visit | Attending: Obstetrics & Gynecology | Admitting: Obstetrics & Gynecology

## 2012-04-29 DIAGNOSIS — O10019 Pre-existing essential hypertension complicating pregnancy, unspecified trimester: Secondary | ICD-10-CM | POA: Insufficient documentation

## 2012-04-29 DIAGNOSIS — O209 Hemorrhage in early pregnancy, unspecified: Secondary | ICD-10-CM

## 2012-04-29 DIAGNOSIS — O10919 Unspecified pre-existing hypertension complicating pregnancy, unspecified trimester: Secondary | ICD-10-CM

## 2012-04-29 NOTE — MAU Note (Signed)
Pt reports + upt at home today. Pt has had spotting for 3 days.

## 2012-04-29 NOTE — MAU Note (Signed)
Pt states, " I want to a pregnancy test so I can get the proof of pregnancy so I can start my care. My baby died last year at 54 wks and I have a bicornis uterus."

## 2012-04-30 ENCOUNTER — Inpatient Hospital Stay (HOSPITAL_COMMUNITY): Payer: Medicaid Other

## 2012-04-30 DIAGNOSIS — O209 Hemorrhage in early pregnancy, unspecified: Secondary | ICD-10-CM

## 2012-04-30 LAB — CBC
MCH: 28.3 pg (ref 26.0–34.0)
MCHC: 34.2 g/dL (ref 30.0–36.0)
MCV: 82.8 fL (ref 78.0–100.0)
Platelets: 247 10*3/uL (ref 150–400)
RDW: 13.8 % (ref 11.5–15.5)

## 2012-04-30 LAB — WET PREP, GENITAL

## 2012-04-30 MED ORDER — LABETALOL HCL 200 MG PO TABS: 200.0000 mg | ORAL_TABLET | Freq: Two times a day (BID) | ORAL | Status: DC

## 2012-04-30 MED ORDER — CONCEPT OB 130-92.4-1 MG PO CAPS: 1.0000 | ORAL_CAPSULE | ORAL | Status: DC

## 2012-04-30 NOTE — Discharge Instructions (Signed)
Arterial Hypertension Arterial hypertension (high blood pressure) is a condition of elevated pressure in your blood vessels. Hypertension over a long period of time is a risk factor for strokes, heart attacks, and heart failure. It is also the leading cause of kidney (renal) failure.  CAUSES   In Adults -- Over 90% of all hypertension has no known cause. This is called essential or primary hypertension. In the other 10% of people with hypertension, the increase in blood pressure is caused by another disorder. This is called secondary hypertension. Important causes of secondary hypertension are:   Heavy alcohol use.   Obstructive sleep apnea.   Hyperaldosterosim (Conn's syndrome).   Steroid use.   Chronic kidney failure.   Hyperparathyroidism.   Medications.   Renal artery stenosis.   Pheochromocytoma.   Cushing's disease.   Coarctation of the aorta.   Scleroderma renal crisis.   Licorice (in excessive amounts).   Drugs (cocaine, methamphetamine).  Your caregiver can explain any items above that apply to you.  In Children -- Secondary hypertension is more common and should always be considered.   Pregnancy -- Few women of childbearing age have high blood pressure. However, up to 10% of them develop hypertension of pregnancy. Generally, this will not harm the woman. It may be a sign of 3 complications of pregnancy: preeclampsia, HELLP syndrome, and eclampsia. Follow up and control with medication is necessary.  SYMPTOMS   This condition normally does not produce any noticeable symptoms. It is usually found during a routine exam.   Malignant hypertension is a late problem of high blood pressure. It may have the following symptoms:   Headaches.   Blurred vision.   End-organ damage (this means your kidneys, heart, lungs, and other organs are being damaged).   Stressful situations can increase the blood pressure. If a person with normal blood pressure has their blood  pressure go up while being seen by their caregiver, this is often termed "white coat hypertension." Its importance is not known. It may be related with eventually developing hypertension or complications of hypertension.   Hypertension is often confused with mental tension, stress, and anxiety.  DIAGNOSIS  The diagnosis is made by 3 separate blood pressure measurements. They are taken at least 1 week apart from each other. If there is organ damage from hypertension, the diagnosis may be made without repeat measurements. Hypertension is usually identified by having blood pressure readings:  Above 140/90 mmHg measured in both arms, at 3 separate times, over a couple weeks.   Over 130/80 mmHg should be considered a risk factor and may require treatment in patients with diabetes.  Blood pressure readings over 120/80 mmHg are called "pre-hypertension" even in non-diabetic patients. To get a true blood pressure measurement, use the following guidelines. Be aware of the factors that can alter blood pressure readings.  Take measurements at least 1 hour after caffeine.   Take measurements 30 minutes after smoking and without any stress. This is another reason to quit smoking - it raises your blood pressure.   Use a proper cuff size. Ask your caregiver if you are not sure about your cuff size.   Most home blood pressure cuffs are automatic. They will measure systolic and diastolic pressures. The systolic pressure is the pressure reading at the start of sounds. Diastolic pressure is the pressure at which the sounds disappear. If you are elderly, measure pressures in multiple postures. Try sitting, lying or standing.   Sit at rest for a minimum of   5 minutes before taking measurements.   You should not be on any medications like decongestants. These are found in many cold medications.   Record your blood pressure readings and review them with your caregiver.  If you have hypertension:  Your caregiver  may do tests to be sure you do not have secondary hypertension (see "causes" above).   Your caregiver may also look for signs of metabolic syndrome. This is also called Syndrome X or Insulin Resistance Syndrome. You may have this syndrome if you have type 2 diabetes, abdominal obesity, and abnormal blood lipids in addition to hypertension.   Your caregiver will take your medical and family history and perform a physical exam.   Diagnostic tests may include blood tests (for glucose, cholesterol, potassium, and kidney function), a urinalysis, or an EKG. Other tests may also be necessary depending on your condition.  PREVENTION  There are important lifestyle issues that you can adopt to reduce your chance of developing hypertension:  Maintain a normal weight.   Limit the amount of salt (sodium) in your diet.   Exercise often.   Limit alcohol intake.   Get enough potassium in your diet. Discuss specific advice with your caregiver.   Follow a DASH diet (dietary approaches to stop hypertension). This diet is rich in fruits, vegetables, and low-fat dairy products, and avoids certain fats.  PROGNOSIS  Essential hypertension cannot be cured. Lifestyle changes and medical treatment can lower blood pressure and reduce complications. The prognosis of secondary hypertension depends on the underlying cause. Many people whose hypertension is controlled with medicine or lifestyle changes can live a normal, healthy life.  RISKS AND COMPLICATIONS  While high blood pressure alone is not an illness, it often requires treatment due to its short- and long-term effects on many organs. Hypertension increases your risk for:  CVAs or strokes (cerebrovascular accident).   Heart failure due to chronically high blood pressure (hypertensive cardiomyopathy).   Heart attack (myocardial infarction).   Damage to the retina (hypertensive retinopathy).   Kidney failure (hypertensive nephropathy).  Your caregiver can  explain list items above that apply to you. Treatment of hypertension can significantly reduce the risk of complications. TREATMENT   For overweight patients, weight loss and regular exercise are recommended. Physical fitness lowers blood pressure.   Mild hypertension is usually treated with diet and exercise. A diet rich in fruits and vegetables, fat-free dairy products, and foods low in fat and salt (sodium) can help lower blood pressure. Decreasing salt intake decreases blood pressure in a 1/3 of people.   Stop smoking if you are a smoker.  The steps above are highly effective in reducing blood pressure. While these actions are easy to suggest, they are difficult to achieve. Most patients with moderate or severe hypertension end up requiring medications to bring their blood pressure down to a normal level. There are several classes of medications for treatment. Blood pressure pills (antihypertensives) will lower blood pressure by their different actions. Lowering the blood pressure by 10 mmHg may decrease the risk of complications by as much as 25%. The goal of treatment is effective blood pressure control. This will reduce your risk for complications. Your caregiver will help you determine the best treatment for you according to your lifestyle. What is excellent treatment for one person, may not be for you. HOME CARE INSTRUCTIONS   Do not smoke.   Follow the lifestyle changes outlined in the "Prevention" section.   If you are on medications, follow the directions   carefully. Blood pressure medications must be taken as prescribed. Skipping doses reduces their benefit. It also puts you at risk for problems.   Follow up with your caregiver, as directed.   If you are asked to monitor your blood pressure at home, follow the guidelines in the "Diagnosis" section above.  SEEK MEDICAL CARE IF:   You think you are having medication side effects.   You have recurrent headaches or lightheadedness.     You have swelling in your ankles.   You have trouble with your vision.  SEEK IMMEDIATE MEDICAL CARE IF:   You have sudden onset of chest pain or pressure, difficulty breathing, or other symptoms of a heart attack.   You have a severe headache.   You have symptoms of a stroke (such as sudden weakness, difficulty speaking, difficulty walking).  MAKE SURE YOU:   Understand these instructions.   Will watch your condition.   Will get help right away if you are not doing well or get worse.  Document Released: 11/09/2005 Document Revised: 10/29/2011 Document Reviewed: 06/09/2007 [employer] Patient Information 2012 [employer]. 

## 2012-04-30 NOTE — MAU Provider Note (Addendum)
[name] [id number] @[redacted]w[redacted]d  by LMP Chief Complaint  Patient presents with  . Amenorrhea     First Provider Initiated Contact with Patient 04/29/12 2352      SUBJECTIVE  HPI: Six-day history of light vaginal spotting pink to brown. Denies cramping or abdominal pain. Denies irritative vaginal discharge or dysuria. She's been out of her antihypertensive meds (hydrochlorothiazide) since January and could not afford it.  O positive.  Past Medical History  Diagnosis Date  . Anxiety   . GERD (gastroesophageal reflux disease)   . STD (female)     HX of STD's  . Abnormal Pap smear 1998    had colpo  . HCV infection     HX of Hep C  . 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 Bicornuate uterus   . Depression   . Hypertension 2010  . Bicornate uterus   . Headache   . Chlamydia 2000  . Asthma     childhood asthma   Past Surgical History  Procedure Date  . Therapeutic abortion   . Therapeutic abortion   . Tonsillectomy   . Dilation and curettage of uterus     SAB  . Finger surgery 2003    reattachment of right forefinger  . Cervical cerclage 09/01/2011    Procedure: CERCLAGE CERVICAL;  Surgeon: Reva Bores, MD;  Location: [facility];  Service: Gynecology;  Laterality: N/A;   History   Social History  . Marital Status: Single    Spouse Name: N/A    Number of Children: N/A  . Years of Education: N/A   Occupational History  . Not on file.   Social History Main Topics  . Smoking status: Current Everyday Smoker -- 0.5 packs/day for 20 years    Types: Cigarettes  . Smokeless tobacco: Never Used  . Alcohol Use: No  . Drug Use: No  . Sexually Active: Yes    Birth Control/ Protection: None   Other Topics Concern  . Not on file   Social History Narrative  . No narrative on file   No current facility-administered medications on file prior to encounter.   Current Outpatient Prescriptions on File Prior to Encounter  Medication Sig Dispense Refill  .  acetaminophen (TYLENOL) 325 MG tablet Take 650 mg by mouth every 6 (six) hours as needed. For pain       . famotidine (PEPCID) 20 MG tablet Take 20 mg by mouth 2 (two) times daily as needed. For heartburn       . hydrochlorothiazide (HYDRODIURIL) 25 MG tablet Take 1 tablet (25 mg total) by mouth daily.  30 tablet  3  . hydrochlorothiazide (HYDRODIURIL) 25 MG tablet Take 1 tablet (25 mg total) by mouth daily.  30 tablet  0  . norethindrone (ORTHO MICRONOR) 0.35 MG tablet Take 1 tablet (0.35 mg total) by mouth daily.  1 Package  11   No Known Allergies  ROS: Pertinent items in HPI  OBJECTIVE Blood pressure 160/106, pulse 77, temperature 99.7 F (37.6 C), temperature source Oral, resp. rate 18, height 5\' 2"  (1.575 m), weight 74.106 kg (163 lb 6 oz), last menstrual period 03/22/2012.  GENERAL: Well-developed, well-nourished female in no acute distress.  HEENT: Normocephalic, good dentition HEART: normal rate RESP: normal effort ABDOMEN: Soft, nontender EXTREMITIES: Nontender, no edema NEURO: Alert and oriented SPECULUM EXAM: NEFG, scant brown blood noted, cervix clean BIMANUAL: cervix closed; uterus slightly enlarged; no adnexal tenderness or masses   LAB RESULTS  Results for orders placed during the hospital encounter of 04/29/12 (from the past 24 hour(s))  POCT PREGNANCY, URINE     Status: Abnormal   Collection Time   04/29/12  9:26 PM      Component Value Range   Preg Test, Ur POSITIVE (*) NEGATIVE   CBC     Status: Abnormal   Collection Time   04/30/12 12:02 AM      Component Value Range   WBC 12.4 (*) 4.0 - 10.5 (K/uL)   RBC 4.88  3.87 - 5.11 (MIL/uL)   Hemoglobin 13.8  12.0 - 15.0 (g/dL)   HCT 16.1  09.6 - 04.5 (%)   MCV 82.8  78.0 - 100.0 (fL)   MCH 28.3  26.0 - 34.0 (pg)   MCHC 34.2  30.0 - 36.0 (g/dL)   RDW 40.9  81.1 - 91.4 (%)   Platelets 247  150 - 400 (K/uL)  HCG, QUANTITATIVE, PREGNANCY     Status: Abnormal   Collection Time   04/30/12 12:02 AM      Component  Value Range   hCG, Beta Chain, Quant, S 1351 (*) <5 (mIU/mL)  WET PREP, GENITAL     Status: Abnormal   Collection Time   04/30/12 12:30 AM      Component Value Range   Yeast Wet Prep HPF POC NONE SEEN  NONE SEEN    Trich, Wet Prep NONE SEEN  NONE SEEN    Clue Cells Wet Prep HPF POC NONE SEEN  NONE SEEN    WBC, Wet Prep HPF POC MODERATE (*) NONE SEEN     IMAGING Study Result     *RADIOLOGY REPORT*  Clinical Data: Bleeding, pregnant  OBSTETRIC <14 WK [place] AND TRANSVAGINAL OB US  Technique: Both transabdominal and transvaginal ultrasound  examinations were performed for complete evaluation of the  gestation as well as the maternal uterus, adnexal regions, and  pelvic cul-de-sac. Transvaginal technique was performed to assess  early pregnancy.  Comparison: None.  Intrauterine gestational sac: Visualized/normal in shape.  Yolk sac: Not identified  Embryo: Not identified  Cardiac Activity: Not applicable  MSD: 3.4 mm 4 w 6 d  [place] EDC: 01/01/2013  Maternal uterus/adnexae: Bicornuate uterus. Gestational sac  located within the left horn. No subchorionic hemorrhage. Normal  sonographic appearance to the ovaries. Corpus luteal cyst noted on  the right. No free fluid.  IMPRESSION:  Single intrauterine gestational sac within the left horn of the  bicornuate uterus. No yolk sac or embryo at this time which may be  due to the early timing of the examination. Recommend correlation  with serial quantitative beta HCG and follow-up ultrasound as  warranted.  Original Report Authenticated By: Waneta Martins, M.D.       Signed  Date/Time    Phone  Pager    DEL Fausto Skillern  04/30/2012  1:42 AM EDT  [phone]       ASSESSMENT Intrauterine gestational sac consistent with 4 week 6 day gestation Bleeding in early pregnancy Chronic hypertension, uncontrolled Poor obstetrical history  PLAN Return for quantitative beta hCG in 2 days Rx PNVs, pregnancy verification letter, work  excuse Consult with Dr. Despina Hidden re: antihypertensive regimen. Will begin with labetalol 200 mg by mouth twice a day     Ormond Lazo 04/30/2012 12:39 AM

## 2012-05-02 ENCOUNTER — Encounter (HOSPITAL_COMMUNITY): Payer: Self-pay | Admitting: *Deleted

## 2012-05-02 ENCOUNTER — Other Ambulatory Visit: Payer: Self-pay | Admitting: Obstetrics and Gynecology

## 2012-05-02 ENCOUNTER — Telehealth: Payer: Self-pay | Admitting: Obstetrics and Gynecology

## 2012-05-02 ENCOUNTER — Inpatient Hospital Stay (HOSPITAL_COMMUNITY)
Admission: AD | Admit: 2012-05-02 | Discharge: 2012-05-02 | Discharge status: Against Medical Advice | Payer: Medicaid Other | Source: Ambulatory Visit | Attending: Obstetrics and Gynecology | Admitting: Obstetrics and Gynecology

## 2012-05-02 DIAGNOSIS — O209 Hemorrhage in early pregnancy, unspecified: Secondary | ICD-10-CM

## 2012-05-02 DIAGNOSIS — Z09 Encounter for follow-up examination after completed treatment for conditions other than malignant neoplasm: Secondary | ICD-10-CM

## 2012-05-02 DIAGNOSIS — O169 Unspecified maternal hypertension, unspecified trimester: Secondary | ICD-10-CM

## 2012-05-02 DIAGNOSIS — O10019 Pre-existing essential hypertension complicating pregnancy, unspecified trimester: Secondary | ICD-10-CM | POA: Insufficient documentation

## 2012-05-02 LAB — HCG, QUANTITATIVE, PREGNANCY: hCG, Beta Chain, Quant, S: 3903 m[IU]/mL — ABNORMAL HIGH (ref <5)

## 2012-05-02 NOTE — MAU Note (Signed)
Pt called with lab result, states will pick up BP medicine when gets off of work.  To return if has any problems

## 2012-05-02 NOTE — Telephone Encounter (Signed)
Error

## 2012-05-02 NOTE — MAU Note (Signed)
Brownish d/c continues, scant amount, only when wipes.  Only pain is breast tenderness.  Has not picked up htn meds- pharmacy didn't have it, would be available today after 4.

## 2012-05-02 NOTE — Telephone Encounter (Signed)
Told her I changed to 4$ PNV and inexpensive antiHTN (Aldomet 500 bid) instead or the Concept Ob and the labetalol. Also told her [place] will call with appt for [place] in 1 wk for viability and to get BP recheck then.

## 2012-05-02 NOTE — MAU Note (Signed)
Pt needing to leave for work, states BP is always high, and she will pick up her medication tonight.

## 2012-05-06 ENCOUNTER — Inpatient Hospital Stay (HOSPITAL_COMMUNITY)
Admission: AD | Admit: 2012-05-06 | Discharge: 2012-05-06 | Disposition: A | Discharge status: Home / Self Care | Payer: Medicaid Other | Source: Ambulatory Visit | Attending: Obstetrics & Gynecology | Admitting: Obstetrics & Gynecology

## 2012-05-06 ENCOUNTER — Ambulatory Visit (HOSPITAL_COMMUNITY)
Admission: RE | Admit: 2012-05-06 | Discharge: 2012-05-06 | Disposition: A | Discharge status: Home / Self Care | Payer: Medicaid Other | Source: Ambulatory Visit | Attending: Obstetrics and Gynecology | Admitting: Obstetrics and Gynecology

## 2012-05-06 ENCOUNTER — Encounter (HOSPITAL_COMMUNITY): Payer: Self-pay

## 2012-05-06 DIAGNOSIS — O10019 Pre-existing essential hypertension complicating pregnancy, unspecified trimester: Secondary | ICD-10-CM | POA: Insufficient documentation

## 2012-05-06 DIAGNOSIS — O3680X0 Pregnancy with inconclusive fetal viability, not applicable or unspecified: Secondary | ICD-10-CM | POA: Insufficient documentation

## 2012-05-06 DIAGNOSIS — O99891 Other specified diseases and conditions complicating pregnancy: Secondary | ICD-10-CM | POA: Insufficient documentation

## 2012-05-06 DIAGNOSIS — Z09 Encounter for follow-up examination after completed treatment for conditions other than malignant neoplasm: Secondary | ICD-10-CM

## 2012-05-06 DIAGNOSIS — O09299 Supervision of pregnancy with other poor reproductive or obstetric history, unspecified trimester: Secondary | ICD-10-CM | POA: Insufficient documentation

## 2012-05-06 DIAGNOSIS — O209 Hemorrhage in early pregnancy, unspecified: Secondary | ICD-10-CM

## 2012-05-06 DIAGNOSIS — I10 Essential (primary) hypertension: Secondary | ICD-10-CM

## 2012-05-06 DIAGNOSIS — O3400 Maternal care for unspecified congenital malformation of uterus, unspecified trimester: Secondary | ICD-10-CM | POA: Insufficient documentation

## 2012-05-06 NOTE — MAU Note (Signed)
Pt sent from u/s for results, denies bleeding or pain at present.

## 2012-05-06 NOTE — Discharge Instructions (Signed)
Hypertension Information As your heart beats, it forces blood through your arteries. This force is your blood pressure. If the pressure is too high, it is called hypertension (HTN) or high blood pressure. HTN is dangerous because you may have it and not know it. High blood pressure may mean that your heart has to work harder to pump blood. Your arteries may be narrow or stiff. The extra work puts you at risk for heart disease, stroke, and other problems.  Blood pressure consists of two numbers, a higher number over a lower, 110/72, for example. It is stated as "110 over 72." The ideal is below 120 for the top number (systolic) and under 80 for the bottom (diastolic).  You should pay close attention to your blood pressure if you have certain conditions such as:  Heart failure.   Prior heart attack.   Diabetes   Chronic kidney disease.   Prior stroke.   Multiple risk factors for heart disease.  To see if you have HTN, your blood pressure should be measured while you are seated with your arm held at the level of the heart. It should be measured at least twice. A one-time elevated blood pressure reading (especially in the Emergency Department) does not mean that you need treatment. There may be conditions in which the blood pressure is different between your right and left arms. It is important to see your caregiver soon for a recheck. Most people have essential hypertension which means that there is not a specific cause. This type of high blood pressure may be lowered by changing lifestyle factors such as:  Stress.   Smoking.   Lack of exercise.   Excessive weight.   Drug/tobacco/alcohol use.   Eating less salt.  Most people do not have symptoms from high blood pressure until it has caused damage to the body. Effective treatment can often prevent, delay or reduce that damage. TREATMENT  Treatment for high blood pressure, when a cause has been identified, is directed at the cause. There  are a large number of medications to treat HTN. These fall into several categories, and your caregiver will help you select the medicines that are best for you. Medications may have side effects. You should review side effects with your caregiver. If your blood pressure stays high after you have made lifestyle changes or started on medicines,   Your medication(s) may need to be changed.   Other problems may need to be addressed.   Be certain you understand your prescriptions, and know how and when to take your medicine.   Be sure to follow up with your caregiver within the time frame advised (usually within two weeks) to have your blood pressure rechecked and to review your medications.   If you are taking more than one medicine to lower your blood pressure, make sure you know how and at what times they should be taken. Taking two medicines at the same time can result in blood pressure that is too low.  Document Released: 01/12/2006 Document Revised: 07/22/2011 Document Reviewed: 01/19/2008 [facility] Patient Information 2012 [place], [place].Hypertension Information As your heart beats, it forces blood through your arteries. This force is your blood pressure. If the pressure is too high, it is called hypertension (HTN) or high blood pressure. HTN is dangerous because you may have it and not know it. High blood pressure may mean that your heart has to work harder to pump blood. Your arteries may be narrow or stiff. The extra work puts you  at risk for heart disease, stroke, and other problems.  Blood pressure consists of two numbers, a higher number over a lower, 110/72, for example. It is stated as "110 over 72." The ideal is below 120 for the top number (systolic) and under 80 for the bottom (diastolic).  You should pay close attention to your blood pressure if you have certain conditions such as:  Heart failure.   Prior heart attack.   Diabetes   Chronic kidney disease.   Prior stroke.    Multiple risk factors for heart disease.  To see if you have HTN, your blood pressure should be measured while you are seated with your arm held at the level of the heart. It should be measured at least twice. A one-time elevated blood pressure reading (especially in the Emergency Department) does not mean that you need treatment. There may be conditions in which the blood pressure is different between your right and left arms. It is important to see your caregiver soon for a recheck. Most people have essential hypertension which means that there is not a specific cause. This type of high blood pressure may be lowered by changing lifestyle factors such as:  Stress.   Smoking.   Lack of exercise.   Excessive weight.   Drug/tobacco/alcohol use.   Eating less salt.  Most people do not have symptoms from high blood pressure until it has caused damage to the body. Effective treatment can often prevent, delay or reduce that damage. TREATMENT  Treatment for high blood pressure, when a cause has been identified, is directed at the cause. There are a large number of medications to treat HTN. These fall into several categories, and your caregiver will help you select the medicines that are best for you. Medications may have side effects. You should review side effects with your caregiver. If your blood pressure stays high after you have made lifestyle changes or started on medicines,   Your medication(s) may need to be changed.   Other problems may need to be addressed.   Be certain you understand your prescriptions, and know how and when to take your medicine.   Be sure to follow up with your caregiver within the time frame advised (usually within two weeks) to have your blood pressure rechecked and to review your medications.   If you are taking more than one medicine to lower your blood pressure, make sure you know how and at what times they should be taken. Taking two medicines at the same  time can result in blood pressure that is too low.  Document Released: 01/12/2006 Document Revised: 07/22/2011 Document Reviewed: 01/19/2008 [facility] Patient Information 2012 [place], [place].

## 2012-05-06 NOTE — MAU Provider Note (Signed)
[name] is a 36 y.o. female @ [redacted]w[redacted]d gestation who presents to MAU for follow up ultrasound for viability.   BP 126/96  Pulse 79  Temp 98.1 F (36.7 C) (Oral)  Resp 16  Ht 5\' 2"  (1.575 m)  Wt 161 lb 6 oz (73.199 kg)  BMI 29.52 kg/m2  LMP 03/22/2012  Ultrasound today shows a 5 week 6 day IUP with cardiac activity. EDD 12/31/12.  The patient is hypertensive [facility] she had not been taking her blood pressure because she could not afford it. The medication was changed to Aldomet and she states she has gotten the medication and took it this morning. Her blood pressure remains elevated.    Discussed with Dr. Marice Potter and she will come to MAU to discuss with the patient.  Dr. Marice Potter in to see patient and increased her Aldomet to tid. She is to f/u in [facility]. Pregnancy verification letter given.

## 2012-05-06 NOTE — MAU Note (Signed)
Dr. Marice Potter in to see pt, discussed need to increase htn medications, pt voices understanding. Pt in hurry to get to work on time.

## 2012-05-14 ENCOUNTER — Inpatient Hospital Stay (HOSPITAL_COMMUNITY): Payer: Medicaid Other

## 2012-05-14 ENCOUNTER — Encounter (HOSPITAL_COMMUNITY): Payer: Self-pay | Admitting: *Deleted

## 2012-05-14 ENCOUNTER — Inpatient Hospital Stay (HOSPITAL_COMMUNITY)
Admission: AD | Admit: 2012-05-14 | Discharge: 2012-05-14 | Disposition: A | Discharge status: Home / Self Care | Payer: Medicaid Other | Source: Ambulatory Visit | Attending: Family Medicine | Admitting: Family Medicine

## 2012-05-14 DIAGNOSIS — O209 Hemorrhage in early pregnancy, unspecified: Secondary | ICD-10-CM

## 2012-05-14 LAB — URINALYSIS, ROUTINE W REFLEX MICROSCOPIC
Bilirubin Urine: NEGATIVE
Nitrite: NEGATIVE
Specific Gravity, Urine: <1.005 — ABNORMAL LOW (ref 1.005–1.030)
Urobilinogen, UA: 0.2 mg/dL (ref 0.0–1.0)

## 2012-05-14 LAB — URINE MICROSCOPIC-ADD ON

## 2012-05-14 NOTE — MAU Note (Signed)
Pt reports having  Brown   vaginal bleedingthat has turned red  with small clots . reprots having back pain and abd cramping as well

## 2012-05-14 NOTE — MAU Provider Note (Signed)
Chart reviewed and agree with management and plan.  

## 2012-05-14 NOTE — MAU Provider Note (Signed)
History     CSN: [id number]  Arrival date and time: 05/14/12 1132   None     Chief Complaint  Patient presents with  . Vaginal Bleeding   HPI [name] 36 y.o. [redacted]w[redacted]d  Noticed bright red bleeding today while at work.  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  Very concerned about this pregnancy.  OB History    Grav Para Term Preterm Abortions TAB SAB Ect Mult Living   7 3 2 1 3 2 1  0 0 2      Past Medical History  Diagnosis Date  . Anxiety   . GERD (gastroesophageal reflux disease)   . STD (female)     HX of STD's  . Abnormal Pap smear 1998    had colpo  . HCV infection     HX of Hep C  . 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 Bicornuate uterus   . Depression   . Hypertension 2010  . Bicornate uterus   . Headache   . Chlamydia 2000  . Asthma     childhood asthma    Past Surgical History  Procedure Date  . Therapeutic abortion   . Therapeutic abortion   . Tonsillectomy   . Dilation and curettage of uterus     SAB  . Finger surgery 2003    reattachment of right forefinger  . Cervical cerclage 09/01/2011    Procedure: CERCLAGE CERVICAL;  Surgeon: Reva Bores, MD;  Location: [facility];  Service: Gynecology;  Laterality: N/A;    Family History  Problem Relation Age of Onset  . Hypertension Mother   . Thyroid disease Maternal Grandmother   . Diabetes Paternal Grandfather   . Anesthesia problems Neg Hx     History  Substance Use Topics  . Smoking status: Current Everyday Smoker -- 0.5 packs/day for 20 years    Types: Cigarettes  . Smokeless tobacco: Never Used   Comment: plans for today  . Alcohol Use: No    Allergies: No Known Allergies  Prescriptions prior to admission  Medication Sig Dispense Refill  . calcium carbonate (TUMS - DOSED IN MG ELEMENTAL CALCIUM) 500 MG chewable tablet Chew 1 tablet by mouth at bedtime as needed. For heartburn      . famotidine (PEPCID) 20 MG tablet Take 20 mg by mouth 2 (two) times daily  as needed. For heartburn       . methyldopa (ALDOMET) 500 MG tablet Take 500 mg by mouth 3 (three) times daily.      . Prenatal Vit-Fe Fumarate-FA (PRENATAL MULTIVITAMIN) TABS Take 1 tablet by mouth daily.        ROS Physical Exam   Blood pressure 119/85, pulse 76, temperature 97.5 F (36.4 C), temperature source Oral, resp. rate 18, height 5\' 2"  (1.575 m), weight 161 lb (73.029 kg), last menstrual period 03/22/2012.  Physical Exam  Nursing note and vitals reviewed. Constitutional: She is oriented to person, place, and time. She appears well-developed and well-nourished.  HENT:  Head: Normocephalic.  Eyes: EOM are normal.  Neck: Neck supple.  GI: Soft. There is no tenderness. There is no rebound and no guarding.  Musculoskeletal: Normal range of motion.  Neurological: She is alert and oriented to person, place, and time.  Skin: Skin is warm and dry.  Psychiatric: She has a normal mood and affect.    MAU Course  Procedures  MDM Small subchorionic hemorrhage seen on ultrasound.  FHT stable.  Assessment and Plan  Bleeding in pregnancy  - Blood type O positive  Plan Begin prenatal care Note for work given. Bleeding is from small subchorionic hemorrhage and may continue. Return with severe bleeding or severe pain.  Legion Discher 05/14/2012, 12:08 PM

## 2012-05-14 NOTE — Discharge Instructions (Signed)
No smoking, no drugs, no alcohol.  Take a prenatal vitamin one by mouth every day.  Eat small frequent snacks to avoid nausea.  Begin prenatal care as soon as possible. Expect bleeding to resolve slowly.  You may have small amounts of periodic bleeding. Return if you have severe bleeding or severe pain. No sex while you are bleeding.  No douching.  No tampons.  Nothing in the vagina.

## 2012-06-01 ENCOUNTER — Encounter: Payer: Self-pay | Admitting: Family

## 2012-06-01 ENCOUNTER — Ambulatory Visit (INDEPENDENT_AMBULATORY_CARE_PROVIDER_SITE_OTHER): Payer: Medicaid Other | Admitting: Family

## 2012-06-01 VITALS — BP 147/104 | Temp 97.1°F | Wt 166.7 lb

## 2012-06-01 DIAGNOSIS — O139 Gestational [pregnancy-induced] hypertension without significant proteinuria, unspecified trimester: Secondary | ICD-10-CM

## 2012-06-01 DIAGNOSIS — O09219 Supervision of pregnancy with history of pre-term labor, unspecified trimester: Secondary | ICD-10-CM

## 2012-06-01 DIAGNOSIS — O0990 Supervision of high risk pregnancy, unspecified, unspecified trimester: Secondary | ICD-10-CM | POA: Insufficient documentation

## 2012-06-01 DIAGNOSIS — O169 Unspecified maternal hypertension, unspecified trimester: Secondary | ICD-10-CM

## 2012-06-01 LAB — POCT URINALYSIS DIP (DEVICE)
Bilirubin Urine: NEGATIVE
Glucose, UA: NEGATIVE mg/dL
Nitrite: NEGATIVE

## 2012-06-01 NOTE — Progress Notes (Signed)
Pulse-82   Edema- fingers  Pain- lower abd  BP Recheck- 143/103 Pt c/o have spotting during intercourse "everytime I orgasm and lasts for 24 hours" Weight gain 15-25lbs Education booklet and smoking cessation material given

## 2012-06-01 NOTE — Progress Notes (Signed)
Initial OB:   [name] is a [id number] [redacted]w[redacted]d being seen today for her first obstetrical visit.  Her obstetrical history is significant for smoker and history of preterm delivery at 21wk; PPROM, cerclage.  Two prior term vaginal deliveries.  Pt also has chronic hypertension.   Pregnancy history fully reviewed.  Pt declines genetic testing.  Reviewed OB history with Dr. Shawnie Pons, recommend offering patient cerclage OR 17P due to history of two normal term deliveries.  Pt opts for cerclage.  Pt will return in two weeks for ob visit which should include pelvic exam (pt last pap done 06/15/11 - less than year today).  Pt had an early ultrasound which was consistent with EDD.  Exam    Uterine Size: Fetal heart rate seen with ultrasound; pregnancy in left horn  Pelvic Exam:    Perineum: Not done   Vulva: Not done   Vagina:  Not done   pH: Not done   Cervix: Not done   Adnexa: Difficult to palpate secondary to weight   Bony Pelvis: Adequate  System: Breast:  No nipple retraction or dimpling, No nipple discharge or bleeding, No axillary or supraclavicular adenopathy, Normal to palpation without dominant masses   Skin: normal coloration and turgor, no rashes    Neurologic: negative   Extremities: normal strength, tone, and muscle mass   HEENT neck supple with midline trachea and thyroid without masses   Mouth/Teeth mucous membranes moist, pharynx normal without lesions   Neck supple and no masses   Cardiovascular: regular rate and rhythm, no murmurs or gallops   Respiratory:  appears well, vitals normal, no respiratory distress, acyanotic, normal RR, neck free of mass or lymphadenopathy, chest clear, no wheezing, crepitations, rhonchi, normal symmetric air entry   Abdomen: soft, non-tender; bowel sounds normal; no masses,  no organomegaly   Urinary: urethral meatus normal   Plan:  OB labs today; return in two weeks for OB visit pelvic, and to schedule cerclage.  Pt will obtain 24 hr urine  collection at that visit.  Aldomet increased to 750 mg TID due to elevated bp today.

## 2012-06-02 LAB — RUBELLA SCREEN: Rubella: 86.6 IU/mL — ABNORMAL HIGH

## 2012-06-03 LAB — HEMOGLOBINOPATHY EVALUATION
Hemoglobin Other: 0 %
Hgb A2 Quant: 2.5 % (ref 2.2–3.2)
Hgb A: 97.5 % (ref 96.8–97.8)

## 2012-06-05 LAB — CULTURE, OB URINE: Colony Count: 70000

## 2012-06-15 ENCOUNTER — Encounter: Payer: Self-pay | Admitting: Advanced Practice Midwife

## 2012-06-15 ENCOUNTER — Telehealth: Payer: Self-pay | Admitting: *Deleted

## 2012-06-15 ENCOUNTER — Ambulatory Visit (INDEPENDENT_AMBULATORY_CARE_PROVIDER_SITE_OTHER): Payer: Medicaid Other | Admitting: Advanced Practice Midwife

## 2012-06-15 ENCOUNTER — Other Ambulatory Visit (HOSPITAL_COMMUNITY)
Admission: RE | Admit: 2012-06-15 | Discharge: 2012-06-15 | Disposition: A | Discharge status: Home / Self Care | Payer: Medicaid Other | Source: Ambulatory Visit | Attending: Advanced Practice Midwife | Admitting: Advanced Practice Midwife

## 2012-06-15 VITALS — BP 128/85 | Temp 98.5°F | Wt 168.0 lb

## 2012-06-15 DIAGNOSIS — Z113 Encounter for screening for infections with a predominantly sexual mode of transmission: Secondary | ICD-10-CM | POA: Insufficient documentation

## 2012-06-15 DIAGNOSIS — R000 Tachycardia, unspecified: Secondary | ICD-10-CM

## 2012-06-15 DIAGNOSIS — Z01419 Encounter for gynecological examination (general) (routine) without abnormal findings: Secondary | ICD-10-CM | POA: Insufficient documentation

## 2012-06-15 DIAGNOSIS — O09219 Supervision of pregnancy with history of pre-term labor, unspecified trimester: Secondary | ICD-10-CM

## 2012-06-15 DIAGNOSIS — Z1151 Encounter for screening for human papillomavirus (HPV): Secondary | ICD-10-CM | POA: Insufficient documentation

## 2012-06-15 LAB — POCT URINALYSIS DIP (DEVICE)
Bilirubin Urine: NEGATIVE
Glucose, UA: NEGATIVE mg/dL
Ketones, ur: NEGATIVE mg/dL
Leukocytes, UA: NEGATIVE
Nitrite: NEGATIVE

## 2012-06-15 MED ORDER — CEPHALEXIN 500 MG PO CAPS: 500.0000 mg | ORAL_CAPSULE | Freq: Three times a day (TID) | ORAL | Status: AC

## 2012-06-15 NOTE — Progress Notes (Signed)
Heart rate now 90-100. States feels out of breath all the time, not new today. Does have episodes of dizziness and feeling "heart beating out of my chest". Heart sounds s1 and s2 with irregular irregularity, approximately every 5-6 beats.  No history of cardiac problems. >> will refer to Cardiology. Happy to hear FHTs. Wants to schedule cerclage.>> will send request today.  Pelvic done today with pap and cultures. Wants BTL, will sign papers after 20 weeks. UTI noted with + culture >> will Rx Keflex today.

## 2012-06-15 NOTE — Telephone Encounter (Signed)
Will fax referral notes to [facility] cardiology

## 2012-06-15 NOTE — Progress Notes (Signed)
Pulse 205; manual re-count of pulse- 148.        C/o SOB; 98 SpO2.  C/o lower back pain; no pressure.

## 2012-06-15 NOTE — Patient Instructions (Signed)
Cerclage of the Cervix Cerclage of the cervix is a surgical procedure for an incompetent cervix. An incompetent cervix is a weak cervix that opens up before labor begins. Cerclage of the cervix sews the cervix closed during pregnancy.  LET YOUR CAREGIVER KNOW ABOUT:   Allergies to foods or medications.   All over-the-counter, prescription, herbal, eye drops and cream medications you are using.   Taking illegal drugs or drinking an excessive amount of alcohol.   Any recent colds or infections.   Past problems with anesthetics or novocaine.   Past surgery.   History of blood clots or abnormal bleeding problems.   Other medical or health problems.  RISKS AND COMPLICATIONS   Infection.   Bleeding.   Rupturing the amniotic sac (membranes).   Going into early labor and delivery.   Problems with the anesthesia.   Infection of the amniotic sac.  BEFORE THE PROCEDURE   Do not take aspirin.   Do not eat or drink anything 8 hours before the procedure.   Do not smoke.   If you are being admitted the same day as the procedure, arrive at the hospital at least 60 minutes before the surgery or as directed. During this time, you will sign the necessary forms and get prepared for the surgery.   A waiting area is available for family and friends.  PROCEDURE   You will be given an IV (intravenous) and medication to relax you.   You will be put to sleep with a general anesthetic.   A stitch will be placed in and around the cervix to tighten it and keep it closed.  AFTER THE PROCEDURE   You will go to a recovery room where you and the baby are monitored.   Once you are awake, stable, and taking fluids well, barring other problems, you will be allowed to return to your room.   You will usually stay in the hospital overnight.   You may get an injection of progesterone to prevent uterine contractions.   Have someone drive you home and stay with you for a day or two.   You may  be given medications to take when you go home.  HOME CARE INSTRUCTIONS   Only take over-the-counter or prescriptions medicines for pain, discomfort or fever as directed by your caregiver.   Avoid physical activities and exercise until your caregiver says it is okay.   Resume your usual diet.   Do not douche.   Do not have sexual intercourse until your caregiver tells you it is OK.   Keep your follow up surgical and prenatal appointments with your caregiver.  SEEK MEDICAL CARE IF:   You have abnormal vaginal discharge.   You develop a rash.   You are having problems with your medications.   You become lightheaded or feel faint.  SEEK IMMEDIATE MEDICAL CARE IF:   You develop vaginal bleeding.   You are leaking fluid or have a gush of fluid from the vagina.   You develop a temperature of 102 F (38.9 C) or higher.   You pass out.   You have uterine contractions.   You feel the baby is not moving as much as usual or cannot feel the baby move.  Document Released: 10/22/2008 Document Revised: 10/29/2011 Document Reviewed: 10/22/2008 [facility] Patient Information 2012 [place], [place].Pregnancy - First Trimester During sexual intercourse, millions of sperm go into the vagina. Only 1 sperm will penetrate and fertilize the female egg while it is in the  Fallopian tube. One week later, the fertilized egg implants into the wall of the uterus. An embryo begins to develop into a baby. At 6 to 8 weeks, the eyes and face are formed and the heartbeat can be seen on ultrasound. At the end of 12 weeks (first trimester), all the baby's organs are formed. Now that you are pregnant, you will want to do everything you can to have a healthy baby. Two of the most important things are to get good prenatal care and follow your caregiver's instructions. Prenatal care is all the medical care you receive before the baby's birth. It is given to prevent, find, and treat problems during the pregnancy and  childbirth. PRENATAL EXAMS  During prenatal visits, your weight, blood pressure and urine are checked. This is done to make sure you are healthy and progressing normally during the pregnancy.   A pregnant woman should gain 25 to 35 pounds during the pregnancy. However, if you are over weight or underweight, your caregiver will advise you regarding your weight.   Your caregiver will ask and answer questions for you.   Blood work, cervical cultures, other necessary tests and a Pap test are done during your prenatal exams. These tests are done to check on your health and the probable health of your baby. Tests are strongly recommended and done for HIV with your permission. This is the virus that causes AIDS. These tests are done because medications can be given to help prevent your baby from being born with this infection should you have been infected without knowing it. Blood work is also used to find out your blood type, previous infections and follow your blood levels (hemoglobin).   Low hemoglobin (anemia) is common during pregnancy. Iron and vitamins are given to help prevent this. Later in the pregnancy, blood tests for diabetes will be done along with any other tests if any problems develop. You may need tests to make sure you and the baby are doing well.   You may need other tests to make sure you and the baby are doing well.  CHANGES DURING THE FIRST TRIMESTER (THE FIRST 3 MONTHS OF PREGNANCY) Your body goes through many changes during pregnancy. They vary from person to person. Talk to your caregiver about changes you notice and are concerned about. Changes can include:  Your menstrual period stops.   The egg and sperm carry the genes that determine what you look like. Genes from you and your partner are forming a baby. The female genes determine whether the baby is a boy or a girl.   Your body increases in girth and you may feel bloated.   Feeling sick to your stomach (nauseous) and  throwing up (vomiting). If the vomiting is uncontrollable, call your caregiver.   Your breasts will begin to enlarge and become tender.   Your nipples may stick out more and become darker.   The need to urinate more. Painful urination may mean you have a bladder infection.   Tiring easily.   Loss of appetite.   Cravings for certain kinds of food.   At first, you may gain or lose a couple of pounds.   You may have changes in your emotions from day to day (excited to be pregnant or concerned something may go wrong with the pregnancy and baby).   You may have more vivid and strange dreams.  HOME CARE INSTRUCTIONS   It is very important to avoid all smoking, alcohol and un-prescribed drugs during  your pregnancy. These affect the formation and growth of the baby. Avoid chemicals while pregnant to ensure the delivery of a healthy infant.   Start your prenatal visits by the 12th week of pregnancy. They are usually scheduled monthly at first, then more often in the last 2 months before delivery. Keep your caregiver's appointments. Follow your caregiver's instructions regarding medication use, blood and lab tests, exercise, and diet.   During pregnancy, you are providing food for you and your baby. Eat regular, well-balanced meals. Choose foods such as meat, fish, milk and other low fat dairy products, vegetables, fruits, and whole-grain breads and cereals. Your caregiver will tell you of the ideal weight gain.   You can help morning sickness by keeping soda crackers at the bedside. Eat a couple before arising in the morning. You may want to use the crackers without salt on them.   Eating 4 to 5 small meals rather than 3 large meals a day also may help the nausea and vomiting.   Drinking liquids between meals instead of during meals also seems to help nausea and vomiting.   A physical sexual relationship may be continued throughout pregnancy if there are no other problems. Problems may be  early (premature) leaking of amniotic fluid from the membranes, vaginal bleeding, or belly (abdominal) pain.   Exercise regularly if there are no restrictions. Check with your caregiver or physical therapist if you are unsure of the safety of some of your exercises. Greater weight gain will occur in the last 2 trimesters of pregnancy. Exercising will help:   Control your weight.   Keep you in shape.   Prepare you for labor and delivery.   Help you lose your pregnancy weight after you deliver your baby.   Wear a good support or jogging bra for breast tenderness during pregnancy. This may help if worn during sleep too.   Ask when prenatal classes are available. Begin classes when they are offered.   Do not use hot tubs, steam rooms or saunas.   Wear your seat belt when driving. This protects you and your baby if you are in an accident.   Avoid raw meat, uncooked cheese, cat litter boxes and soil used by cats throughout the pregnancy. These carry germs that can cause birth defects in the baby.   The first trimester is a good time to visit your dentist for your dental health. Getting your teeth cleaned is OK. Use a softer toothbrush and brush gently during pregnancy.   Ask for help if you have financial, counseling or nutritional needs during pregnancy. Your caregiver will be able to offer counseling for these needs as well as refer you for other special needs.   Do not take any medications or herbs unless told by your caregiver.   Inform your caregiver if there is any mental or physical domestic violence.   Make a list of emergency phone numbers of family, friends, hospital, and police and fire departments.   Write down your questions. Take them to your prenatal visit.   Do not douche.   Do not cross your legs.   If you have to stand for long periods of time, rotate you feet or take small steps in a circle.   You may have more vaginal secretions that may require a sanitary pad.  Do not use tampons or scented sanitary pads.  MEDICATIONS AND DRUG USE IN PREGNANCY  Take prenatal vitamins as directed. The vitamin should contain 1 milligram of folic acid.  Keep all vitamins out of reach of children. Only a couple vitamins or tablets containing iron may be fatal to a baby or young child when ingested.   Avoid use of all medications, including herbs, over-the-counter medications, not prescribed or suggested by your caregiver. Only take over-the-counter or prescription medicines for pain, discomfort, or fever as directed by your caregiver. Do not use aspirin, ibuprofen, or naproxen unless directed by your caregiver.   Let your caregiver also know about herbs you may be using.   Alcohol is related to a number of birth defects. This includes fetal alcohol syndrome. All alcohol, in any form, should be avoided completely. Smoking will cause low birth rate and premature babies.   Street or illegal drugs are very harmful to the baby. They are absolutely forbidden. A baby born to an addicted mother will be addicted at birth. The baby will go through the same withdrawal an adult does.   Let your caregiver know about any medications that you have to take and for what reason you take them.  MISCARRIAGE IS COMMON DURING PREGNANCY A miscarriage does not mean you did something wrong. It is not a reason to worry about getting pregnant again. Your caregiver will help you with questions you may have. If you have a miscarriage, you may need minor surgery. SEEK MEDICAL CARE IF:  You have any concerns or worries during your pregnancy. It is better to call with your questions if you feel they cannot wait, rather than worry about them. SEEK IMMEDIATE MEDICAL CARE IF:   An unexplained oral temperature above 102 F (38.9 C) develops, or as your caregiver suggests.   You have leaking of fluid from the vagina (birth canal). If leaking membranes are suspected, take your temperature and inform your  caregiver of this when you call.   There is vaginal spotting or bleeding. Notify your caregiver of the amount and how many pads are used.   You develop a bad smelling vaginal discharge with a change in the color.   You continue to feel sick to your stomach (nauseated) and have no relief from remedies suggested. You vomit blood or coffee ground-like materials.   You lose more than 2 pounds of weight in 1 week.   You gain more than 2 pounds of weight in 1 week and you notice swelling of your face, hands, feet, or legs.   You gain 5 pounds or more in 1 week (even if you do not have swelling of your hands, face, legs, or feet).   You get exposed to [place] measles and have never had them.   You are exposed to fifth disease or chickenpox.   You develop belly (abdominal) pain. Round ligament discomfort is a common non-cancerous (benign) cause of abdominal pain in pregnancy. Your caregiver still must evaluate this.   You develop headache, fever, diarrhea, pain with urination, or shortness of breath.   You fall or are in a car accident or have any kind of trauma.   There is mental or physical violence in your home.  Document Released: 11/03/2001 Document Revised: 10/29/2011 Document Reviewed: 05/07/2009 [facility] Patient Information 2012 [place], [place].

## 2012-06-15 NOTE — Progress Notes (Signed)
[facility] per patient preference and scheduled first availiable appointment for cardiologist. After appointment scheduled was asked to see if they would place a holter monitor on patient prior to appointment so cardiologist could review at appointment. [facility] cardiology and left a message for office manager to schedule if possible.

## 2012-06-18 ENCOUNTER — Inpatient Hospital Stay (HOSPITAL_COMMUNITY)
Admission: AD | Admit: 2012-06-18 | Discharge: 2012-06-18 | Disposition: A | Discharge status: Home / Self Care | Payer: Medicaid Other | Source: Ambulatory Visit | Attending: Obstetrics and Gynecology | Admitting: Obstetrics and Gynecology

## 2012-06-18 ENCOUNTER — Encounter (HOSPITAL_COMMUNITY): Payer: Self-pay | Admitting: *Deleted

## 2012-06-18 DIAGNOSIS — R42 Dizziness and giddiness: Secondary | ICD-10-CM

## 2012-06-18 DIAGNOSIS — R000 Tachycardia, unspecified: Secondary | ICD-10-CM

## 2012-06-18 DIAGNOSIS — Z3490 Encounter for supervision of normal pregnancy, unspecified, unspecified trimester: Secondary | ICD-10-CM

## 2012-06-18 DIAGNOSIS — K089 Disorder of teeth and supporting structures, unspecified: Secondary | ICD-10-CM | POA: Insufficient documentation

## 2012-06-18 DIAGNOSIS — R030 Elevated blood-pressure reading, without diagnosis of hypertension: Secondary | ICD-10-CM | POA: Insufficient documentation

## 2012-06-18 DIAGNOSIS — O210 Mild hyperemesis gravidarum: Secondary | ICD-10-CM | POA: Insufficient documentation

## 2012-06-18 DIAGNOSIS — I10 Essential (primary) hypertension: Secondary | ICD-10-CM

## 2012-06-18 DIAGNOSIS — R51 Headache: Secondary | ICD-10-CM | POA: Insufficient documentation

## 2012-06-18 DIAGNOSIS — R110 Nausea: Secondary | ICD-10-CM

## 2012-06-18 DIAGNOSIS — O99891 Other specified diseases and conditions complicating pregnancy: Secondary | ICD-10-CM | POA: Insufficient documentation

## 2012-06-18 LAB — CBC WITH DIFFERENTIAL/PLATELET
Basophils Absolute: 0 10*3/uL (ref 0.0–0.1)
Eosinophils Absolute: 0.1 10*3/uL (ref 0.0–0.7)
Eosinophils Relative: 2 % (ref 0–5)
Lymphocytes Relative: 23 % (ref 12–46)
MCH: 29.4 pg (ref 26.0–34.0)
MCV: 81.9 fL (ref 78.0–100.0)
Neutrophils Relative %: 67 % (ref 43–77)
Platelets: 231 10*3/uL (ref 150–400)
RDW: 13.5 % (ref 11.5–15.5)
WBC: 9.5 10*3/uL (ref 4.0–10.5)

## 2012-06-18 LAB — COMPREHENSIVE METABOLIC PANEL
ALT: 10 U/L (ref 0–35)
AST: 15 U/L (ref 0–37)
Calcium: 9.7 mg/dL (ref 8.4–10.5)
Sodium: 130 mEq/L — ABNORMAL LOW (ref 135–145)
Total Protein: 6.1 g/dL (ref 6.0–8.3)

## 2012-06-18 MED ORDER — HYDROCODONE-ACETAMINOPHEN 5-325 MG PO TABS
1.0000 | ORAL_TABLET | Freq: Once | ORAL | Status: AC
  Administered 2012-06-18: 1 via ORAL
  Filled 2012-06-18: qty 1

## 2012-06-18 MED ORDER — ONDANSETRON 8 MG PO TBDP: 8.0000 mg | ORAL_TABLET | Freq: Three times a day (TID) | ORAL | Status: AC | PRN

## 2012-06-18 MED ORDER — ONDANSETRON HCL 4 MG/2ML IJ SOLN
4.0000 mg | Freq: Once | INTRAMUSCULAR | Status: AC
  Administered 2012-06-18: 4 mg via INTRAVENOUS
  Filled 2012-06-18: qty 2

## 2012-06-18 MED ORDER — LACTATED RINGERS IV BOLUS (SEPSIS)
1000.0000 mL | Freq: Once | INTRAVENOUS | Status: AC
  Administered 2012-06-18: 1000 mL via INTRAVENOUS

## 2012-06-18 NOTE — MAU Note (Signed)
Pt is a Midwife so stands a lot. WHile working suddenly felt nauseated, then heart started racing, vomited and urinated on self. Also dizziness during all this which was about an hour ago. Just finished wearing halter monitor for hx rapid heart rate. Will f/u with [facility] 8/9

## 2012-06-18 NOTE — MAU Provider Note (Signed)
History     CSN: [id number]  Arrival date and time: 06/18/12 2009   First Provider Initiated Contact with Patient 06/18/12 2102      Chief Complaint  Patient presents with  . Tachycardia  . Morning Sickness  . Dizziness   HPI [name] is a 36 y.o. female @ [redacted]w[redacted]d gestation who presents to MAU for nausea, vomiting and dizziness and headache. She was at work approximately 7:15 pm when the symptoms started. She describes the headache as a constant ache that she rates 8/10. History of migraines. The headache is located in the frontal area.   Denies abdominal pain, bleeding or leaking of fluid. When vomiting did have leaking of urine. Patient goes to [facility] for prenatal care due to Bicornate uterus and tachycardis. This pregnancy is in the left. Patient arrived to MAU via private vehicle, friend drove. Currently the patient is under the care of a Cardiologist for tachycardia and palpations. Has been wearing a halter monitor until yesterday.  The history was provided by the patient.  OB History    Grav Para Term Preterm Abortions TAB SAB Ect Mult Living   7 3 2 1 3 2 1  0 0 2      Past Medical History  Diagnosis Date  . Anxiety   . GERD (gastroesophageal reflux disease)   . STD (female)     HX of STD's  . Abnormal Pap smear 1998    had colpo  . HCV infection     HX of Hep C  . 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 Bicornuate uterus   . Depression   . Hypertension 2010  . Bicornate uterus   . Headache   . Chlamydia 2000  . Asthma     childhood asthma    Past Surgical History  Procedure Date  . Therapeutic abortion   . Therapeutic abortion   . Tonsillectomy   . Dilation and curettage of uterus     SAB  . Finger surgery 2003    reattachment of right forefinger  . Cervical cerclage 09/01/2011    Procedure: CERCLAGE CERVICAL;  Surgeon: Reva Bores, MD;  Location: [facility];  Service: Gynecology;  Laterality: N/A;    Family History  Problem Relation Age  of Onset  . Hypertension Mother   . Thyroid disease Maternal Grandmother   . Diabetes Paternal Grandfather   . Anesthesia problems Neg Hx   . Other Neg Hx     History  Substance Use Topics  . Smoking status: Current Everyday Smoker -- 0.5 packs/day for 20 years    Types: Cigarettes  . Smokeless tobacco: Never Used   Comment: plans for today  . Alcohol Use: No    Allergies: No Known Allergies  Prescriptions prior to admission  Medication Sig Dispense Refill  . acetaminophen (TYLENOL) 500 MG tablet Take 500 mg by mouth every 6 (six) hours as needed. pain      . calcium carbonate (TUMS - DOSED IN MG ELEMENTAL CALCIUM) 500 MG chewable tablet Chew 1 tablet by mouth at bedtime as needed. For heartburn      . cephALEXin (KEFLEX) 500 MG capsule Take 1 capsule (500 mg total) by mouth 3 (three) times daily.  21 capsule  0  . famotidine (PEPCID) 20 MG tablet Take 20 mg by mouth 2 (two) times daily as needed. For heartburn       . methyldopa (ALDOMET) 500 MG tablet Take 750 mg by mouth  3 (three) times daily.       . nicotine (NICODERM CQ - DOSED IN MG/24 HOURS) 14 mg/24hr patch Place 1 patch onto the skin daily.      . Prenatal Vit-Fe Fumarate-FA (PRENATAL MULTIVITAMIN) TABS Take 1 tablet by mouth daily.        Review of Systems  Constitutional: Negative for fever, chills and weight loss.  HENT: Negative for ear pain, nosebleeds, congestion, sore throat and neck pain.   Eyes: Positive for photophobia. Negative for blurred vision, double vision and pain.  Respiratory: Positive for shortness of breath. Negative for cough and wheezing.   Cardiovascular: Positive for palpitations. Negative for chest pain and leg swelling.  Gastrointestinal: Positive for heartburn, nausea and vomiting. Negative for abdominal pain, diarrhea and constipation.  Genitourinary: Positive for frequency. Negative for dysuria and urgency.  Musculoskeletal: Positive for back pain. Negative for myalgias.  Skin: Negative  for itching and rash.  Neurological: Positive for dizziness and headaches. Negative for sensory change, speech change, seizures and weakness.  Endo/Heme/Allergies: Does not bruise/bleed easily.  Psychiatric/Behavioral: Negative for depression. The patient is not nervous/anxious and does not have insomnia.       Blood pressure 142/107, pulse 105, temperature 98.9 F (37.2 C), temperature source Oral, resp. rate 20, height 5\' 2"  (1.575 m), weight 173 lb 8 oz (78.699 kg), last menstrual period 03/22/2012, SpO2 100.00%.  Physical Exam  Nursing note and vitals reviewed. Constitutional: She is oriented to person, place, and time. She appears well-developed and well-nourished. No distress.  HENT:  Head: Normocephalic and atraumatic.  Mouth/Throat: Oropharynx is clear and moist and mucous membranes are normal.         Decay of second molar lower left.  Eyes: Conjunctivae and EOM are normal. Pupils are equal, round, and reactive to light. Right eye exhibits no nystagmus. Left eye exhibits no nystagmus.  Neck: Neck supple.  Cardiovascular:       Tachycardia   Respiratory: Effort normal and breath sounds normal.  GI: Soft. There is no tenderness.  Musculoskeletal: Normal range of motion.  Neurological: She is alert and oriented to person, place, and time. She has normal strength and normal reflexes. No cranial nerve deficit or sensory deficit. She displays a negative Romberg sign. Coordination normal.  Skin: Skin is warm and dry.  Psychiatric: She has a normal mood and affect. Her behavior is normal. Judgment and thought content normal.   Results for orders placed during the hospital encounter of 06/18/12 (from the past 24 hour(s))  CBC WITH DIFFERENTIAL     Status: Abnormal   Collection Time   06/18/12  9:40 PM      Component Value Range   WBC 9.5  4.0 - 10.5 K/uL   RBC 4.08  3.87 - 5.11 MIL/uL   Hemoglobin 12.0  12.0 - 15.0 g/dL   HCT 16.1 (*) 09.6 - 04.5 %   MCV 81.9  78.0 - 100.0 fL     MCH 29.4  26.0 - 34.0 pg   MCHC 35.9  30.0 - 36.0 g/dL   RDW 40.9  81.1 - 91.4 %   Platelets 231  150 - 400 K/uL   Neutrophils Relative 67  43 - 77 %   Neutro Abs 6.4  1.7 - 7.7 K/uL   Lymphocytes Relative 23  12 - 46 %   Lymphs Abs 2.2  0.7 - 4.0 K/uL   Monocytes Relative 8  3 - 12 %   Monocytes Absolute 0.8  0.1 -  1.0 K/uL   Eosinophils Relative 2  0 - 5 %   Eosinophils Absolute 0.1  0.0 - 0.7 K/uL   Basophils Relative 0  0 - 1 %   Basophils Absolute 0.0  0.0 - 0.1 K/uL  COMPREHENSIVE METABOLIC PANEL     Status: Abnormal   Collection Time   06/18/12  9:40 PM      Component Value Range   Sodium 130 (*) 135 - 145 mEq/L   Potassium 3.4 (*) 3.5 - 5.1 mEq/L   Chloride 95 (*) 96 - 112 mEq/L   CO2 24  19 - 32 mEq/L   Glucose, Bld 101 (*) 70 - 99 mg/dL   BUN 8  6 - 23 mg/dL   Creatinine, Ser 1.91 (*) 0.50 - 1.10 mg/dL   Calcium 9.7  8.4 - 47.8 mg/dL   Total Protein 6.1  6.0 - 8.3 g/dL   Albumin 3.3 (*) 3.5 - 5.2 g/dL   AST 15  0 - 37 U/L   ALT 10  0 - 35 U/L   Alkaline Phosphatase 46  39 - 117 U/L   Total Bilirubin 0.1 (*) 0.3 - 1.2 mg/dL   GFR calc non Af Amer >90  >90 mL/min   GFR calc Af Amer >90  >90 mL/min   @ 23:23 pm patient feeling much better. No nausea, slight headache.   Procedures Assessment:  36 y.o. female @ [redacted]w[redacted]d gestation with dizziness Dental pain Headache Tachycardia Elevated BP (did not take BP @ 8 pm but did take at 11pm)  Plan: IV hydration, Zofran IV, Vicodin 5/325 po  Rx Zofran ODT  Continue Keflex for UTI (and for possible dental infection)  Follow up as scheduled with [facility] and Cardiologist  Take BP again at home since has taken BP medication if continues to be elevated will need follow up.  Return here as needed.  NEESE,HOPE 06/18/2012, 11:25 PM

## 2012-06-18 NOTE — MAU Note (Signed)
Pt states, " I was at work and I felt nauseated and my pulse went up and I felt dizzy."

## 2012-06-18 NOTE — Progress Notes (Signed)
Written and verbal d/c instructions given and understanding voiced. 

## 2012-06-19 NOTE — MAU Provider Note (Signed)
Agree with above note.  [name] 06/19/2012 7:46 AM

## 2012-06-21 ENCOUNTER — Inpatient Hospital Stay (HOSPITAL_COMMUNITY): Admission: RE | Admit: 2012-06-21 | Payer: Medicaid Other | Source: Ambulatory Visit

## 2012-06-22 ENCOUNTER — Other Ambulatory Visit (HOSPITAL_COMMUNITY): Payer: Medicaid Other

## 2012-06-22 ENCOUNTER — Encounter: Payer: Self-pay | Admitting: *Deleted

## 2012-06-24 ENCOUNTER — Encounter (HOSPITAL_COMMUNITY): Payer: Self-pay | Admitting: *Deleted

## 2012-06-24 ENCOUNTER — Encounter (HOSPITAL_COMMUNITY): Payer: Self-pay

## 2012-06-24 ENCOUNTER — Inpatient Hospital Stay (HOSPITAL_COMMUNITY): Payer: Medicaid Other

## 2012-06-24 ENCOUNTER — Inpatient Hospital Stay (HOSPITAL_COMMUNITY)
Admission: AD | Admit: 2012-06-24 | Discharge: 2012-06-24 | Disposition: A | Discharge status: Home / Self Care | Payer: Medicaid Other | Source: Ambulatory Visit | Attending: Obstetrics and Gynecology | Admitting: Obstetrics and Gynecology

## 2012-06-24 ENCOUNTER — Encounter (HOSPITAL_COMMUNITY)
Admission: RE | Admit: 2012-06-24 | Discharge: 2012-06-24 | Disposition: A | Discharge status: Home / Self Care | Payer: Medicaid Other | Source: Ambulatory Visit | Attending: Obstetrics & Gynecology | Admitting: Obstetrics & Gynecology

## 2012-06-24 DIAGNOSIS — R109 Unspecified abdominal pain: Secondary | ICD-10-CM | POA: Insufficient documentation

## 2012-06-24 DIAGNOSIS — N949 Unspecified condition associated with female genital organs and menstrual cycle: Secondary | ICD-10-CM

## 2012-06-24 DIAGNOSIS — O99891 Other specified diseases and conditions complicating pregnancy: Secondary | ICD-10-CM | POA: Insufficient documentation

## 2012-06-24 DIAGNOSIS — O26899 Other specified pregnancy related conditions, unspecified trimester: Secondary | ICD-10-CM

## 2012-06-24 DIAGNOSIS — O9989 Other specified diseases and conditions complicating pregnancy, childbirth and the puerperium: Secondary | ICD-10-CM

## 2012-06-24 HISTORY — DX: Other specified postprocedural states: Z98.890

## 2012-06-24 HISTORY — DX: Other specified postprocedural states: R11.2

## 2012-06-24 HISTORY — DX: Pregnant state, incidental: Z33.1

## 2012-06-24 HISTORY — DX: Encounter for supervision of normal pregnancy, unspecified, unspecified trimester: Z34.90

## 2012-06-24 LAB — COMPREHENSIVE METABOLIC PANEL
Alkaline Phosphatase: 46 U/L (ref 39–117)
BUN: 6 mg/dL (ref 6–23)
Calcium: 9.6 mg/dL (ref 8.4–10.5)
GFR calc Af Amer: >90 mL/min (ref >90)
Glucose, Bld: 107 mg/dL — ABNORMAL HIGH (ref 70–99)
Potassium: 3.6 mEq/L (ref 3.5–5.1)
Total Protein: 6.8 g/dL (ref 6.0–8.3)

## 2012-06-24 LAB — CBC
HCT: 37.8 % (ref 36.0–46.0)
Hemoglobin: 13.2 g/dL (ref 12.0–15.0)
MCH: 29 pg (ref 26.0–34.0)
MCHC: 34.9 g/dL (ref 30.0–36.0)
MCV: 83.1 fL (ref 78.0–100.0)

## 2012-06-24 LAB — URINALYSIS, ROUTINE W REFLEX MICROSCOPIC
Ketones, ur: NEGATIVE mg/dL
Leukocytes, UA: NEGATIVE
Nitrite: NEGATIVE
pH: 6 (ref 5.0–8.0)

## 2012-06-24 LAB — URINE MICROSCOPIC-ADD ON

## 2012-06-24 NOTE — MAU Provider Note (Signed)
[name] [id number] @[redacted]w[redacted]d  by LMP Chief Complaint  Patient presents with  . Abdominal Pain     First Provider Initiated Contact with Patient 06/24/12 2252      SUBJECTIVE  HPI: Patient is a 36 year old gravida 7 para 2132 at 13 weeks and 3 days pregnant with a history of PPROM at 21 weeks who presents to maternity admissions with contractions since 5:30 PM. She reports being on her feet all day long. She is being treated for a UTI with Keflex. She has had spotting throughout the pregnancy, no change today. She denies any other vaginal discharge, leaking of fluid, urinary symptoms, GI symptoms or fever. She is started prenatal care at the high risk clinic at [facility] hospital and has a cerclage scheduled for next week.  Past Medical History  Diagnosis Date  . Anxiety   . GERD (gastroesophageal reflux disease)   . STD (female)     HX of STD's  . Abnormal Pap smear 1998    had colpo  . HCV infection     HX of Hep C  . 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 Bicornuate uterus   . Depression   . Hypertension 2010  . Bicornate uterus   . Headache   . Chlamydia 2000  . Asthma     childhood asthma  . PONV (postoperative nausea and vomiting)   . IUP (intrauterine pregnancy), incidental 13 weeks   Past Surgical History  Procedure Date  . Therapeutic abortion   . Therapeutic abortion   . Tonsillectomy   . Dilation and curettage of uterus     SAB  . Finger surgery 2003    reattachment of right forefinger  . Cervical cerclage 09/01/2011    Procedure: CERCLAGE CERVICAL;  Surgeon: Reva Bores, MD;  Location: [facility];  Service: Gynecology;  Laterality: N/A;   History   Social History  . Marital Status: Single    Spouse Name: N/A    Number of Children: N/A  . Years of Education: N/A   Occupational History  . Not on file.   Social History Main Topics  . Smoking status: Current Some Day Smoker -- 20 years    Types: Cigarettes  . Smokeless tobacco:  Former Neurosurgeon    Quit date: 05/24/2012   Comment: nicotine patch currently  . Alcohol Use: No  . Drug Use: No     methodone treatment ended 3/12-2010  . Sexually Active: Yes    Birth Control/ Protection: None   Other Topics Concern  . Not on file   Social History Narrative  . No narrative on file   No current facility-administered medications on file prior to encounter.   Current Outpatient Prescriptions on File Prior to Encounter  Medication Sig Dispense Refill  . acetaminophen (TYLENOL) 500 MG tablet Take 500 mg by mouth every 6 (six) hours as needed. pain      . calcium carbonate (TUMS - DOSED IN MG ELEMENTAL CALCIUM) 500 MG chewable tablet Chew 1 tablet by mouth at bedtime as needed. For heartburn      . cephALEXin (KEFLEX) 500 MG capsule Take 1 capsule (500 mg total) by mouth 3 (three) times daily.  21 capsule  0  . famotidine (PEPCID) 20 MG tablet Take 20 mg by mouth 2 (two) times daily as needed. For heartburn       . methyldopa (ALDOMET) 500 MG tablet Take 750 mg by mouth 3 (three) times daily.       Marland Kitchen  nicotine (NICODERM CQ - DOSED IN MG/24 HOURS) 14 mg/24hr patch Place 1 patch onto the skin daily.      . ondansetron (ZOFRAN ODT) 8 MG disintegrating tablet Take 1 tablet (8 mg total) by mouth every 8 (eight) hours as needed for nausea.  20 tablet  0  . Prenatal Vit-Fe Fumarate-FA (PRENATAL MULTIVITAMIN) TABS Take 1 tablet by mouth daily.       No Known Allergies  ROS: Pertinent items in HPI  OBJECTIVE Blood pressure 149/100, pulse 86, temperature 98.7 F (37.1 C), temperature source Oral, resp. rate 18, height 5\' 2"  (1.575 m), weight 78.075 kg (172 lb 2 oz), last menstrual period 03/22/2012.  GENERAL: Well-developed, well-nourished female in no acute distress.  HEENT: Normocephalic HEART: normal rate RESP: normal effort ABDOMEN: Soft, nontender EXTREMITIES: Nontender, no edema NEURO: Alert and oriented SPECULUM EXAM: deferred  LAB RESULTS  Results for orders placed  during the hospital encounter of 06/24/12 (from the past 24 hour(s))  URINALYSIS, ROUTINE W REFLEX MICROSCOPIC     Status: Abnormal   Collection Time   06/24/12  8:58 PM      Component Value Range   Color, Urine YELLOW  YELLOW   APPearance CLEAR  CLEAR   Specific Gravity, Urine 1.025  1.005 - 1.030   pH 6.0  5.0 - 8.0   Glucose, UA NEGATIVE  NEGATIVE mg/dL   Hgb urine dipstick MODERATE (*) NEGATIVE   Bilirubin Urine NEGATIVE  NEGATIVE   Ketones, ur NEGATIVE  NEGATIVE mg/dL   Protein, ur NEGATIVE  NEGATIVE mg/dL   Urobilinogen, UA 0.2  0.0 - 1.0 mg/dL   Nitrite NEGATIVE  NEGATIVE   Leukocytes, UA NEGATIVE  NEGATIVE  URINE MICROSCOPIC-ADD ON     Status: Abnormal   Collection Time   06/24/12  8:58 PM      Component Value Range   Squamous Epithelial / LPF FEW (*) RARE   WBC, UA 0-2  <3 WBC/hpf   RBC / HPF 0-2  <3 RBC/hpf    IMAGING Cervical length 4.3 cm per [place]  ASSESSMENT 1. Pain of round ligament complicating pregnancy, antepartum    PLAN D/C home F/U w/ cerclage placement Medication List  As of 06/24/2012 11:13 PM   ASK your doctor about these medications         acetaminophen 500 MG tablet   Commonly known as: TYLENOL   Take 500 mg by mouth every 6 (six) hours as needed. pain      calcium carbonate 500 MG chewable tablet   Commonly known as: TUMS - dosed in mg elemental calcium   Chew 1 tablet by mouth at bedtime as needed. For heartburn      cephALEXin 500 MG capsule   Commonly known as: KEFLEX   Take 1 capsule (500 mg total) by mouth 3 (three) times daily.      famotidine 20 MG tablet   Commonly known as: PEPCID   Take 20 mg by mouth 2 (two) times daily as needed. For heartburn        methyldopa 500 MG tablet   Commonly known as: ALDOMET   Take 750 mg by mouth 3 (three) times daily.      nicotine 14 mg/24hr patch   Commonly known as: NICODERM CQ - dosed in mg/24 hours   Place 1 patch onto the skin daily.      ondansetron 8 MG disintegrating tablet    Commonly known as: ZOFRAN-ODT   Take 1 tablet (8 mg total)  by mouth every 8 (eight) hours as needed for nausea.      prenatal multivitamin Tabs   Take 1 tablet by mouth daily.           Follow-up Information    Follow up with [facility]. (As scheduled or MAU as needed if symptoms worsen)    Contact information:   [address] [address]          Dorathy Kinsman 06/24/2012 11:13 PM

## 2012-06-24 NOTE — MAU Note (Signed)
Pt states, " I've had contractions since 5;30 pm tonight and pressure and low back pain."

## 2012-06-24 NOTE — Patient Instructions (Addendum)
   Your procedure is scheduled on: Tuesday August 6th  Enter through the [employer] of [facility] at: 8am Pick up the phone at the desk and dial [phone] and inform us of your arrival.  Please call this number if you have any problems the morning of surgery: [phone]  Remember: Do not eat food after midnight: Monday  Do not drink clear liquids after: midnight Monday Take these medicines the morning of surgery with a SIP OF WATER: blood pressure medicine  Do not wear jewelry, make-up, or FINGER nail polish No metal in your hair or on your body. Do not wear lotions, powders, perfumes or deodorant. Do not shave 48 hours prior to surgery. Do not bring valuables to the hospital. Contacts, dentures or bridgework may not be worn into surgery.    Patients discharged on the day of surgery will not be allowed to drive home.     Remember to use your hibiclens as instructed.Please shower with 1/2 bottle the evening before your surgery and the other 1/2 bottle the morning of surgery. Neck down avoiding private area.

## 2012-06-28 ENCOUNTER — Ambulatory Visit (HOSPITAL_COMMUNITY): Payer: Medicaid Other | Admitting: Anesthesiology

## 2012-06-28 ENCOUNTER — Encounter (HOSPITAL_COMMUNITY): Payer: Self-pay | Admitting: Anesthesiology

## 2012-06-28 ENCOUNTER — Ambulatory Visit (HOSPITAL_COMMUNITY)
Admission: RE | Admit: 2012-06-28 | Discharge: 2012-06-28 | Disposition: A | Discharge status: Home / Self Care | Payer: Medicaid Other | Source: Ambulatory Visit | Attending: Obstetrics & Gynecology | Admitting: Obstetrics & Gynecology

## 2012-06-28 ENCOUNTER — Encounter (HOSPITAL_COMMUNITY): Payer: Self-pay | Admitting: *Deleted

## 2012-06-28 ENCOUNTER — Encounter (HOSPITAL_COMMUNITY): Payer: Self-pay | Admitting: Family Medicine

## 2012-06-28 ENCOUNTER — Encounter (HOSPITAL_COMMUNITY)
Admission: RE | Disposition: A | Discharge status: Home / Self Care | Payer: Self-pay | Source: Ambulatory Visit | Attending: Obstetrics & Gynecology

## 2012-06-28 DIAGNOSIS — Z01812 Encounter for preprocedural laboratory examination: Secondary | ICD-10-CM | POA: Insufficient documentation

## 2012-06-28 DIAGNOSIS — O3430 Maternal care for cervical incompetence, unspecified trimester: Secondary | ICD-10-CM | POA: Diagnosis present

## 2012-06-28 HISTORY — PX: CERVICAL CERCLAGE: SHX1329

## 2012-06-28 SURGERY — CERCLAGE, CERVIX, VAGINAL APPROACH
Anesthesia: Spinal | Site: Vagina | Wound class: Clean Contaminated

## 2012-06-28 MED ORDER — LACTATED RINGERS IV SOLN: INTRAVENOUS | Status: DC | PRN

## 2012-06-28 MED ORDER — MEPERIDINE HCL 25 MG/ML IJ SOLN: 6.2500 mg | INTRAMUSCULAR | Status: DC | PRN

## 2012-06-28 MED ORDER — ACETAMINOPHEN 325 MG PO TABS
ORAL_TABLET | ORAL | Status: AC
  Administered 2012-06-28: 975 mg via ORAL
  Filled 2012-06-28: qty 3

## 2012-06-28 MED ORDER — KETOROLAC TROMETHAMINE 30 MG/ML IJ SOLN: 15.0000 mg | Freq: Once | INTRAMUSCULAR | Status: DC | PRN

## 2012-06-28 MED ORDER — FENTANYL CITRATE 0.05 MG/ML IJ SOLN
INTRAMUSCULAR | Status: AC
  Filled 2012-06-28: qty 5

## 2012-06-28 MED ORDER — LACTATED RINGERS IV SOLN
INTRAVENOUS | Status: DC | PRN
  Administered 2012-06-28 (×2): via INTRAVENOUS

## 2012-06-28 MED ORDER — LACTATED RINGERS IV SOLN
INTRAVENOUS | Status: DC
  Administered 2012-06-28: 08:00:00 via INTRAVENOUS

## 2012-06-28 MED ORDER — PROMETHAZINE HCL 25 MG/ML IJ SOLN
INTRAMUSCULAR | Status: AC
  Filled 2012-06-28: qty 1

## 2012-06-28 MED ORDER — FENTANYL CITRATE 0.05 MG/ML IJ SOLN
INTRAMUSCULAR | Status: DC | PRN
  Administered 2012-06-28 (×5): 50 ug via INTRAVENOUS

## 2012-06-28 MED ORDER — CEFAZOLIN SODIUM-DEXTROSE 2-3 GM-% IV SOLR
INTRAVENOUS | Status: AC
  Filled 2012-06-28: qty 50

## 2012-06-28 MED ORDER — ACETAMINOPHEN 325 MG PO TABS
975.0000 mg | ORAL_TABLET | Freq: Once | ORAL | Status: AC
  Administered 2012-06-28: 975 mg via ORAL

## 2012-06-28 MED ORDER — FENTANYL CITRATE 0.05 MG/ML IJ SOLN
25.0000 ug | INTRAMUSCULAR | Status: DC | PRN
  Administered 2012-06-28 (×2): 50 ug via INTRAVENOUS

## 2012-06-28 MED ORDER — INDOMETHACIN 50 MG PO CAPS: 50.0000 mg | ORAL_CAPSULE | Freq: Four times a day (QID) | ORAL | Status: DC

## 2012-06-28 MED ORDER — FENTANYL CITRATE 0.05 MG/ML IJ SOLN
INTRAMUSCULAR | Status: AC
  Filled 2012-06-28: qty 2

## 2012-06-28 MED ORDER — SCOPOLAMINE 1 MG/3DAYS TD PT72
MEDICATED_PATCH | TRANSDERMAL | Status: AC
  Filled 2012-06-28: qty 1

## 2012-06-28 MED ORDER — BUPIVACAINE HCL (PF) 0.25 % IJ SOLN
INTRAMUSCULAR | Status: AC
  Filled 2012-06-28: qty 30

## 2012-06-28 MED ORDER — BUPIVACAINE IN DEXTROSE 0.75-8.25 % IT SOLN
INTRATHECAL | Status: DC | PRN
  Administered 2012-06-28: 1.2 mL via INTRATHECAL

## 2012-06-28 MED ORDER — SCOPOLAMINE 1 MG/3DAYS TD PT72
1.0000 | MEDICATED_PATCH | TRANSDERMAL | Status: DC
  Administered 2012-06-28: 1.5 mg via TRANSDERMAL

## 2012-06-28 MED ORDER — CEFAZOLIN SODIUM-DEXTROSE 2-3 GM-% IV SOLR
2.0000 g | INTRAVENOUS | Status: AC
  Administered 2012-06-28: 2 g via INTRAVENOUS

## 2012-06-28 MED ORDER — BUPIVACAINE HCL (PF) 0.25 % IJ SOLN
INTRAMUSCULAR | Status: DC | PRN
  Administered 2012-06-28: 20 mL

## 2012-06-28 MED ORDER — PROMETHAZINE HCL 25 MG/ML IJ SOLN
6.2500 mg | INTRAMUSCULAR | Status: DC | PRN
  Administered 2012-06-28: 6.25 mg via INTRAVENOUS

## 2012-06-28 SURGICAL SUPPLY — 19 items
CATH ROBINSON RED A/P 16FR (CATHETERS) IMPLANT
CLOTH BEACON ORANGE TIMEOUT ST (SAFETY) ×2 IMPLANT
COUNTER NEEDLE 1200 MAGNETIC (NEEDLE) ×2 IMPLANT
GLOVE BIOGEL PI IND STRL 7.0 (GLOVE) ×1 IMPLANT
GLOVE BIOGEL PI INDICATOR 7.0 (GLOVE) ×1
GLOVE ECLIPSE 7.0 STRL STRAW (GLOVE) ×4 IMPLANT
GOWN PREVENTION PLUS LG XLONG (DISPOSABLE) ×2 IMPLANT
GOWN PREVENTION PLUS XLARGE (GOWN DISPOSABLE) ×2 IMPLANT
NEEDLE MAYO .5 CIRCLE (NEEDLE) ×2 IMPLANT
NEEDLE SPNL 22GX3.5 QUINCKE BK (NEEDLE) IMPLANT
PACK VAGINAL MINOR WOMEN LF (CUSTOM PROCEDURE TRAY) ×2 IMPLANT
PAD PREP 24X48 CUFFED NSTRL (MISCELLANEOUS) ×2 IMPLANT
SUT MERSILENE 5MM BP 1 12 (SUTURE) ×2 IMPLANT
SYR BULB IRRIGATION 50ML (SYRINGE) ×2 IMPLANT
SYR CONTROL 10ML LL (SYRINGE) IMPLANT
TOWEL OR 17X24 6PK STRL BLUE (TOWEL DISPOSABLE) ×4 IMPLANT
TUBING NON-CON 1/4 X 20 CONN (TUBING) IMPLANT
WATER STERILE IRR 1000ML POUR (IV SOLUTION) ×2 IMPLANT
YANKAUER SUCT BULB TIP NO VENT (SUCTIONS) IMPLANT

## 2012-06-28 NOTE — Anesthesia Postprocedure Evaluation (Signed)
Anesthesia Post Note  Patient: [name]  Procedure(s) Performed: Procedure(s) (LRB): CERCLAGE CERVICAL (N/A)  Anesthesia type: Spinal  Patient location: PACU  Post pain: Pain level controlled  Post assessment: Post-op Vital signs reviewed  Last Vitals:  Filed Vitals:   06/28/12 1045  BP: 108/87  Pulse: 65  Temp:   Resp: 18    Post vital signs: Reviewed  Level of consciousness: awake  Complications: No apparent anesthesia complications

## 2012-06-28 NOTE — MAU Provider Note (Signed)
Agree with above note.  [name] 06/28/2012 8:46 AM

## 2012-06-28 NOTE — Anesthesia Procedure Notes (Signed)
Spinal  Patient location during procedure: OR Start time: 06/28/2012 9:37 AM End time: 06/28/2012 9:40 AM Staffing Anesthesiologist: Sandrea Hughs Performed by: anesthesiologist  Preanesthetic Checklist Completed: patient identified, site marked, surgical consent, pre-op evaluation, timeout performed, IV checked, risks and benefits discussed and monitors and equipment checked Spinal Block Patient position: sitting Prep: DuraPrep Patient monitoring: heart rate, cardiac monitor, continuous pulse ox and blood pressure Approach: midline Location: L3-4 Injection technique: single-shot Needle Needle type: Sprotte  Needle gauge: 24 G Needle length: 9 cm Needle insertion depth: 5 cm Assessment Sensory level: T10

## 2012-06-28 NOTE — Transfer of Care (Signed)
Immediate Anesthesia Transfer of Care Note  Patient: [name]  Procedure(s) Performed: Procedure(s) (LRB): CERCLAGE CERVICAL (N/A)  Patient Location: PACU  Anesthesia Type: Spinal  Level of Consciousness: awake, alert , oriented and patient cooperative  Airway & Oxygen Therapy: Patient Spontanous Breathing  Post-op Assessment: Report given to PACU RN and Post -op Vital signs reviewed and stable  Post vital signs: Reviewed and stable  Complications: No apparent anesthesia complications

## 2012-06-28 NOTE — Progress Notes (Signed)
Patient states that she does not feel like she needs to void.  Bladder was scanned at this time and >999 was noted.  Dr. Malen Gauze called at this time.  Orders received.

## 2012-06-28 NOTE — H&P (Signed)
[name] is an 36 y.o. [phone] [redacted]w[redacted]d female.   Chief Complaint: Incompetent cervix  HPI: 36 y.o. [id number] @ [redacted]w[redacted]d  Who has h/o 2 prior full term births.  Had rescue cerclage during her last pregnancy, and subsequently delivered 21 wk fetus.  Here today for prophylactic cerclage.     Past Medical History  Diagnosis Date  . Anxiety   . GERD (gastroesophageal reflux disease)   . STD (female)     HX of STD's  . Abnormal Pap smear 1998    had colpo  . HCV infection     HX of Hep C  . 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 Bicornuate uterus   . Depression   . Hypertension 2010  . Bicornate uterus   . Headache   . Chlamydia 2000  . Asthma     childhood asthma  . PONV (postoperative nausea and vomiting)   . IUP (intrauterine pregnancy), incidental 13 weeks    Past Surgical History  Procedure Date  . Therapeutic abortion   . Therapeutic abortion   . Tonsillectomy   . Dilation and curettage of uterus     SAB  . Finger surgery 2003    reattachment of right forefinger  . Cervical cerclage 09/01/2011    Procedure: CERCLAGE CERVICAL;  Surgeon: Reva Bores, MD;  Location: [facility];  Service: Gynecology;  Laterality: N/A;    Family History  Problem Relation Age of Onset  . Hypertension Mother   . Thyroid disease Maternal Grandmother   . Diabetes Paternal Grandfather   . Anesthesia problems Neg Hx   . Other Neg Hx    Social History:  reports that she has been smoking Cigarettes.  She has smoked for the past 20 years. She quit smokeless tobacco use about 5 weeks ago. She reports that she does not drink alcohol or use illicit drugs.  Allergies: No Known Allergies  Medications Prior to Admission  Medication Sig Dispense Refill  . methyldopa (ALDOMET) 500 MG tablet Take 750 mg by mouth 3 (three) times daily.       Marland Kitchen acetaminophen (TYLENOL) 500 MG tablet Take 500 mg by mouth every 6 (six) hours as needed. pain      . calcium carbonate (TUMS - DOSED IN MG  ELEMENTAL CALCIUM) 500 MG chewable tablet Chew 1 tablet by mouth at bedtime as needed. For heartburn      . famotidine (PEPCID) 20 MG tablet Take 20 mg by mouth 2 (two) times daily as needed. For heartburn       . nicotine (NICODERM CQ - DOSED IN MG/24 HOURS) 14 mg/24hr patch Place 1 patch onto the skin daily.      . Prenatal Vit-Fe Fumarate-FA (PRENATAL MULTIVITAMIN) TABS Take 1 tablet by mouth daily.         A comprehensive review of systems was negative.  Blood pressure 136/100, pulse 93, temperature 98.2 F (36.8 C), temperature source Oral, resp. rate 18, last menstrual period 03/22/2012, SpO2 99.00%. BP 136/100  Pulse 93  Temp 98.2 F (36.8 C) (Oral)  Resp 18  SpO2 99%  LMP 03/22/2012 General appearance: alert, cooperative and appears stated age Neck: supple, symmetrical, trachea midline Lungs: clear to auscultation bilaterally Heart: regular rate and rhythm, S1, S2 normal, no murmur, click, rub or gallop Abdomen: soft, non-tender; bowel sounds normal; no masses,  no organomegaly Skin: Skin color, texture, turgor normal. No rashes or lesions Neurologic: Grossly normal   Lab Results  Component Value Date   WBC 8.1 06/24/2012   HGB 13.2 06/24/2012   HCT 37.8 06/24/2012   MCV 83.1 06/24/2012   PLT 232 06/24/2012   Lab Results  Component Value Date   PREGTESTUR POSITIVE* 04/29/2012     Assessment/Plan Patient Active Problem List  Diagnosis  . HCV infection  . Hypertension  . Bipolar disorder  . Bicornuate uterus  . Hypertension complicating pregnancy  . Tobacco use complicating pregnancy or childbirth  . History of intravenous drug use in remission  . History of preterm delivery, currently pregnant  . Supervision of high-risk pregnancy   For prophylactic cervical cerclage.  Risks include but are not limited to bleeding, infection, injury to surrounding structures, blood clots, and death.  Likelihood of success is high.   Kiauna Zywicki S 06/28/2012, 9:17 AM

## 2012-06-28 NOTE — Op Note (Signed)
Preoperative diagnosis: Incompetent cervix   Postoperative diagnosis: Same  Procedure: Cervical cerclage  Surgeon: Tinnie Gens, M.D.  Anesthesia: Spinal, [place]  Findings: closed, long cervix  Estimated blood loss: 50 cc  Specimens: None  Reason for procedure: [name] [id number] [redacted]w[redacted]d, with h/o rescue cerclage and loss of pregnancy at 21 wks last pregnancy for prophylactic cerclage.  Procedure: Patient was taken to the operating room where spinal analgesia was administered. She was prepped and draped in the usual sterile fashion.  A Foley catheter is used to drain her bladder. A timeout was performed. The patient had SCDs in place and had received a gram of Cefotan prior to procedure. The patient was in dorsal lithotomy.  A weighted speculum was placed inside the vagina.  A Deaver was used anteriorly. The cervix was grasped with an open ring  forcep. A 5 mm Mersilene band on a cutting needle was used and to put in a pursestring suture. This was started at 12:00 and exiting at 9:00, starting at 9:00 and exiting at 7:00, starting at 7:00 and exiting at 5:00, starting at 5:00 and exiting at 2:00, starting at 2:00 and exiting at 12:00. A suture was then tied down. All instrument, needle and lap counts were correct x 2. The patient was taken to recovery in stable condition.  Osmond Steckman SMD 06/28/2012 10:06 AM

## 2012-06-28 NOTE — Anesthesia Preprocedure Evaluation (Signed)
Anesthesia Evaluation  Patient identified by MRN, date of birth, ID band Patient awake    Reviewed: Allergy & Precautions, H&P , NPO status , Patient's Chart, lab work & pertinent test results  Airway Mallampati: II TM Distance: >3 FB Neck ROM: full    Dental No notable dental hx.    Pulmonary    Pulmonary exam normal       Cardiovascular hypertension, Pt. on medications     Neuro/Psych    GI/Hepatic Neg liver ROS, GERD-  Medicated,  Endo/Other  negative endocrine ROS  Renal/GU negative Renal ROS  negative genitourinary   Musculoskeletal negative musculoskeletal ROS (+)   Abdominal Normal abdominal exam  (+)   Peds negative pediatric ROS (+)  Hematology negative hematology ROS (+)   Anesthesia Other Findings   Reproductive/Obstetrics (+) Pregnancy                           Anesthesia Physical Anesthesia Plan  ASA: II  Anesthesia Plan: Spinal   Post-op Pain Management:    Induction:   Airway Management Planned:   Additional Equipment:   Intra-op Plan:   Post-operative Plan:   Informed Consent: I have reviewed the patients History and Physical, chart, labs and discussed the procedure including the risks, benefits and alternatives for the proposed anesthesia with the patient or authorized representative who has indicated his/her understanding and acceptance.     Plan Discussed with: CRNA and Surgeon  Anesthesia Plan Comments:         Anesthesia Quick Evaluation

## 2012-06-28 NOTE — Progress Notes (Signed)
Patient tolerated in and out cath well.  Please see I&O record

## 2012-06-30 ENCOUNTER — Ambulatory Visit (INDEPENDENT_AMBULATORY_CARE_PROVIDER_SITE_OTHER): Payer: Medicaid Other | Admitting: Physician Assistant

## 2012-06-30 ENCOUNTER — Encounter (HOSPITAL_COMMUNITY): Payer: Self-pay | Admitting: Family Medicine

## 2012-06-30 VITALS — BP 145/102 | Temp 97.0°F | Wt 170.2 lb

## 2012-06-30 DIAGNOSIS — O3430 Maternal care for cervical incompetence, unspecified trimester: Secondary | ICD-10-CM

## 2012-06-30 DIAGNOSIS — O169 Unspecified maternal hypertension, unspecified trimester: Secondary | ICD-10-CM

## 2012-06-30 DIAGNOSIS — O139 Gestational [pregnancy-induced] hypertension without significant proteinuria, unspecified trimester: Secondary | ICD-10-CM

## 2012-06-30 LAB — POCT URINALYSIS DIP (DEVICE)
Bilirubin Urine: NEGATIVE
Glucose, UA: NEGATIVE mg/dL
Nitrite: NEGATIVE

## 2012-06-30 MED ORDER — LABETALOL HCL 200 MG PO TABS: 200.0000 mg | ORAL_TABLET | Freq: Two times a day (BID) | ORAL | Status: DC

## 2012-06-30 NOTE — Progress Notes (Signed)
Will d/c aldomet and start Labetalol 200mg  BID now that pt has MCD. S/p prophylatic cerclage on 06/28/12. No complaints. States red spotting for 12-24 hours after procedure, now only scant brown. Pt instructed to stop aldomet and will start labetalol today.

## 2012-06-30 NOTE — Progress Notes (Signed)
Pulse- 97 BP Recheck 147/100- Pt just took BP medication @ 080

## 2012-06-30 NOTE — Patient Instructions (Signed)
Hypertension During Pregnancy Hypertension is also called high blood pressure. It can occur at any time in life and during pregnancy. When you have hypertension, there is extra pressure inside your blood vessels that carry blood from the heart to the rest of your body (arteries). Hypertension during pregnancy can cause problems for you and your baby. Your baby might not weigh as much as it should at birth or might be born early (premature). Very bad cases of hypertension during pregnancy can be life-threatening.  There are different types of hypertension during pregnancy.   Chronic hypertension. This happens when a woman has hypertension before pregnancy and it continues during pregnancy.   Gestational hypertension. This is when hypertension develops during pregnancy.   Preeclampsia or toxemia of pregnancy. This is a very serious type of hypertension that develops only during pregnancy. It is a disease that affects the whole body (systemic) and can be very dangerous for both mother and baby.   Gestational hypertension and preeclampsia usually go away after your baby is born. Blood pressure generally stabilizes within 6 weeks. Women who have hypertension during pregnancy have a greater chance of developing hypertension later in life or with future pregnancies. UNDERSTANDING BLOOD PRESSURE Blood pressure moves blood in your body. Sometimes, the force that moves the blood becomes too strong.  A blood pressure reading is given in 2 numbers and looks like a fraction.   The top number is called the systolic pressure. When your heart beats, it forces more blood to flow through the arteries. Pressure inside the arteries goes up.   The bottom number is the diastolic pressure. Pressure goes down between beats. That is when the heart is resting.   You may have hypertension if:   Your systolic blood pressure is above 140.   Your diastolic pressure is above 90.  RISK FACTORS Some factors make you more  likely to develop hypertension during pregnancy. Risk factors include:  Having hypertension before pregnancy.   Having hypertension during a previous pregnancy.   Being overweight.   Being older than 40.   Being pregnant with more than 1 baby (multiples).   Having diabetes or kidney problems.  SYMPTOMS Chronic and gestational hypertension may not cause symptoms. Preeclampsia has symptoms, which may include:  Increased protein in your urine. Your caregiver will check for this at every prenatal visit.   Swelling of your hands and face.   Rapid weight gain.   Headaches.   Visual changes.   Being bothered by light.   Abdominal pain, especially in the right upper area.   Chest pain.   Shortness of breath.   Increased reflexes.   Seizures. Seizures occur with a more severe form of preeclampsia, called eclampsia.  DIAGNOSIS   You may be diagnosed with hypertension during pregnancy during a regular prenatal exam. At each visit, tests may include:   Blood pressure checks.   A urine test to check for protein in your urine.   The type of hypertension you are diagnosed with depends on when you developed it. It also depends on your specific blood pressure reading.   Developing hypertension before 20 weeks of pregnancy is consistent with chronic hypertension.   Developing hypertension after 20 weeks of pregnancy is consistent with gestational hypertension.   Hypertension with increased urinary protein is diagnosed as preeclampsia.   Blood pressure measurements that stay above 160 systolic or 110 diastolic are a sign of severe preeclampsia.  TREATMENT Treatment for hypertension during pregnancy varies. Treatment depends on   the type of hypertension and how serious it is.  If you take medicine for chronic hypertension, you may need to switch medicines.   Drugs called ACE inhibitors should not be taken during pregnancy.   Low-dose aspirin may be suggested for women who have  risk factors for preeclampsia.   If you have gestational hypertension, you may need to take a blood pressure medicine that is safe during pregnancy. Your caregiver will recommend the appropriate medicine.   If you have severe preeclampsia, you may need to be in the hospital. Caregivers will watch you and the baby very closely. You also may need to take medicine (magnesium sulfate) to prevent seizures and lower blood pressure.   Sometimes an early delivery is needed. This may be the case if the condition worsens. It would be done to protect you and the baby. The only cure for preeclampsia is delivery.  HOME CARE INSTRUCTIONS  Schedule and keep all of your regular prenatal care.   Follow your caregiver's instructions for taking medicines. Tell your caregiver about all medicines you take. This includes over-the-counter medicines.   Eat as little salt as possible.   Get regular exercise.   Do not drink alcohol.   Do not use tobacco products.   Do not drink products with caffeine.   Lie on your left side when resting.   Tell your doctor if you have any preeclampsia symptoms.  SEEK IMMEDIATE MEDICAL CARE IF:  You have severe abdominal pain.   You have sudden swelling in the hands, ankles, or face.   You gain 4 pounds (1.8 kg) or more in 1 week.   You vomit repeatedly.   You have vaginal bleeding.   You do not feel the baby moving as much.   You have a headache.   You have blurred or double vision.   You have muscle twitching or spasms.   You have shortness of breath.   You have blue fingernails and lips.   You have blood in your urine.  MAKE SURE YOU:  Understand these instructions.   Will watch your condition.   Will get help right away if you are not doing well.  Document Released: 07/28/2011 Document Revised: 10/29/2011 Document Reviewed: 07/28/2011 [facility] Patient Information 2012 [place], [place].Cerclage of the Cervix Cerclage of the cervix is a surgical  procedure for an incompetent cervix. An incompetent cervix is a weak cervix that opens up before labor begins. Cerclage of the cervix sews the cervix closed during pregnancy.  LET YOUR CAREGIVER KNOW ABOUT:   Allergies to foods or medications.   All over-the-counter, prescription, herbal, eye drops and cream medications you are using.   Taking illegal drugs or drinking an excessive amount of alcohol.   Any recent colds or infections.   Past problems with anesthetics or novocaine.   Past surgery.   History of blood clots or abnormal bleeding problems.   Other medical or health problems.  RISKS AND COMPLICATIONS   Infection.   Bleeding.   Rupturing the amniotic sac (membranes).   Going into early labor and delivery.   Problems with the anesthesia.   Infection of the amniotic sac.  BEFORE THE PROCEDURE   Do not take aspirin.   Do not eat or drink anything 8 hours before the procedure.   Do not smoke.   If you are being admitted the same day as the procedure, arrive at the hospital at least 60 minutes before the surgery or as directed. During this time, you  will sign the necessary forms and get prepared for the surgery.   A waiting area is available for family and friends.  PROCEDURE   You will be given an IV (intravenous) and medication to relax you.   You will be put to sleep with a general anesthetic.   A stitch will be placed in and around the cervix to tighten it and keep it closed.  AFTER THE PROCEDURE   You will go to a recovery room where you and the baby are monitored.   Once you are awake, stable, and taking fluids well, barring other problems, you will be allowed to return to your room.   You will usually stay in the hospital overnight.   You may get an injection of progesterone to prevent uterine contractions.   Have someone drive you home and stay with you for a day or two.   You may be given medications to take when you go home.  HOME CARE  INSTRUCTIONS   Only take over-the-counter or prescriptions medicines for pain, discomfort or fever as directed by your caregiver.   Avoid physical activities and exercise until your caregiver says it is okay.   Resume your usual diet.   Do not douche.   Do not have sexual intercourse until your caregiver tells you it is OK.   Keep your follow up surgical and prenatal appointments with your caregiver.  SEEK MEDICAL CARE IF:   You have abnormal vaginal discharge.   You develop a rash.   You are having problems with your medications.   You become lightheaded or feel faint.  SEEK IMMEDIATE MEDICAL CARE IF:   You develop vaginal bleeding.   You are leaking fluid or have a gush of fluid from the vagina.   You develop a temperature of 102 F (38.9 C) or higher.   You pass out.   You have uterine contractions.   You feel the baby is not moving as much as usual or cannot feel the baby move.  Document Released: 10/22/2008 Document Revised: 10/29/2011 Document Reviewed: 10/22/2008 [facility] Patient Information 2012 [place], [place].

## 2012-07-07 ENCOUNTER — Ambulatory Visit (INDEPENDENT_AMBULATORY_CARE_PROVIDER_SITE_OTHER): Payer: Medicaid Other | Admitting: Obstetrics and Gynecology

## 2012-07-07 ENCOUNTER — Encounter: Payer: Self-pay | Admitting: Obstetrics & Gynecology

## 2012-07-07 VITALS — BP 123/92 | Temp 98.0°F | Wt 167.1 lb

## 2012-07-07 DIAGNOSIS — O139 Gestational [pregnancy-induced] hypertension without significant proteinuria, unspecified trimester: Secondary | ICD-10-CM

## 2012-07-07 DIAGNOSIS — O3430 Maternal care for cervical incompetence, unspecified trimester: Secondary | ICD-10-CM

## 2012-07-07 DIAGNOSIS — I10 Essential (primary) hypertension: Secondary | ICD-10-CM

## 2012-07-07 DIAGNOSIS — Q513 Bicornate uterus: Secondary | ICD-10-CM

## 2012-07-07 DIAGNOSIS — O169 Unspecified maternal hypertension, unspecified trimester: Secondary | ICD-10-CM

## 2012-07-07 DIAGNOSIS — F1911 Other psychoactive substance abuse, in remission: Secondary | ICD-10-CM

## 2012-07-07 DIAGNOSIS — Z87898 Personal history of other specified conditions: Secondary | ICD-10-CM

## 2012-07-07 DIAGNOSIS — O09219 Supervision of pregnancy with history of pre-term labor, unspecified trimester: Secondary | ICD-10-CM

## 2012-07-07 DIAGNOSIS — O0990 Supervision of high risk pregnancy, unspecified, unspecified trimester: Secondary | ICD-10-CM

## 2012-07-07 DIAGNOSIS — B1920 Unspecified viral hepatitis C without hepatic coma: Secondary | ICD-10-CM

## 2012-07-07 LAB — POCT URINALYSIS DIP (DEVICE)
Bilirubin Urine: NEGATIVE
Glucose, UA: NEGATIVE mg/dL
Ketones, ur: NEGATIVE mg/dL
Protein, ur: NEGATIVE mg/dL

## 2012-07-07 NOTE — Progress Notes (Signed)
P= 83 C/o occasional throbbing pain along sides of abdomen Pt not taking indomethicin because it was given as Rx and misplaced, not sent electronically to pharmacy like labetalol for same visit

## 2012-07-07 NOTE — Progress Notes (Signed)
Patient doing well, complaining of round ligament pain, denies any vaginal bleeding. Will schedule anatomy ultrasound for week of 9/9

## 2012-07-10 LAB — CULTURE, OB URINE

## 2012-07-14 ENCOUNTER — Other Ambulatory Visit: Payer: Self-pay | Admitting: Obstetrics and Gynecology

## 2012-07-14 ENCOUNTER — Telehealth: Payer: Self-pay | Admitting: *Deleted

## 2012-07-14 DIAGNOSIS — O3430 Maternal care for cervical incompetence, unspecified trimester: Secondary | ICD-10-CM

## 2012-07-14 DIAGNOSIS — O0990 Supervision of high risk pregnancy, unspecified, unspecified trimester: Secondary | ICD-10-CM

## 2012-07-14 MED ORDER — CEPHALEXIN 500 MG PO CAPS: 500.0000 mg | ORAL_CAPSULE | Freq: Four times a day (QID) | ORAL | Status: DC

## 2012-07-14 NOTE — Telephone Encounter (Signed)
Pt informed. Will pick up medicine at pharmacy.

## 2012-07-14 NOTE — Telephone Encounter (Signed)
Message copied by Mannie Stabile on Thu Jul 14, 2012 11:21 AM ------      Message from: CONSTANT, PEGGY      Created: Thu Jul 14, 2012 10:06 AM       Please inform patient that Rx for keflex has been e-prescribed for treatment of UTI.            Peggy

## 2012-07-20 ENCOUNTER — Telehealth: Payer: Self-pay | Admitting: General Practice

## 2012-07-20 MED ORDER — CEPHALEXIN 500 MG PO CAPS: 500.0000 mg | ORAL_CAPSULE | Freq: Four times a day (QID) | ORAL | Status: AC

## 2012-07-20 NOTE — Telephone Encounter (Signed)
Pt called stating she came in last week for a UTI and a Rx was called in for keflex, when she went to pick it up at [facility] she accidentally left the medicine there in a grocery cart and now [facility] is asking for another Rx to be called in. Patient also states she would like [place] to call that medicine back in for her so she can get started on treatment for the UTI.

## 2012-07-20 NOTE — Telephone Encounter (Signed)
Called pt and informed her that her Rx has been re-sent. She may pick it up today. Pt voiced understanding.

## 2012-07-26 ENCOUNTER — Telehealth: Payer: Self-pay | Admitting: Medical

## 2012-07-26 NOTE — Telephone Encounter (Signed)
Patient called stating that she has been given Keflex for UTI. Last dose of Keflex this am she vomited. Wants to know if she should take another pill or wait for next dose.

## 2012-07-26 NOTE — Telephone Encounter (Signed)
Called patient and told her to wait for the next scheduled dose of Keflex. Told her to allow time for her stomach to settle and then she could take her next dose at the scheduled time and continue as directed from there until all of the pills are complete. The patient voiced understanding and did not have any further questions.

## 2012-07-28 ENCOUNTER — Ambulatory Visit (INDEPENDENT_AMBULATORY_CARE_PROVIDER_SITE_OTHER): Payer: Medicaid Other | Admitting: Obstetrics & Gynecology

## 2012-07-28 ENCOUNTER — Ambulatory Visit (HOSPITAL_COMMUNITY)
Admission: RE | Admit: 2012-07-28 | Discharge: 2012-07-28 | Disposition: A | Discharge status: Home / Self Care | Payer: Medicaid Other | Source: Ambulatory Visit | Attending: Obstetrics and Gynecology | Admitting: Obstetrics and Gynecology

## 2012-07-28 VITALS — BP 120/84 | Temp 97.0°F | Wt 169.1 lb

## 2012-07-28 DIAGNOSIS — O3440 Maternal care for other abnormalities of cervix, unspecified trimester: Secondary | ICD-10-CM | POA: Insufficient documentation

## 2012-07-28 DIAGNOSIS — Z363 Encounter for antenatal screening for malformations: Secondary | ICD-10-CM | POA: Insufficient documentation

## 2012-07-28 DIAGNOSIS — O3400 Maternal care for unspecified congenital malformation of uterus, unspecified trimester: Secondary | ICD-10-CM | POA: Insufficient documentation

## 2012-07-28 DIAGNOSIS — O09299 Supervision of pregnancy with other poor reproductive or obstetric history, unspecified trimester: Secondary | ICD-10-CM | POA: Insufficient documentation

## 2012-07-28 DIAGNOSIS — O10019 Pre-existing essential hypertension complicating pregnancy, unspecified trimester: Secondary | ICD-10-CM | POA: Insufficient documentation

## 2012-07-28 DIAGNOSIS — O3430 Maternal care for cervical incompetence, unspecified trimester: Secondary | ICD-10-CM

## 2012-07-28 DIAGNOSIS — O0990 Supervision of high risk pregnancy, unspecified, unspecified trimester: Secondary | ICD-10-CM

## 2012-07-28 DIAGNOSIS — O358XX0 Maternal care for other (suspected) fetal abnormality and damage, not applicable or unspecified: Secondary | ICD-10-CM | POA: Insufficient documentation

## 2012-07-28 DIAGNOSIS — Z1389 Encounter for screening for other disorder: Secondary | ICD-10-CM | POA: Insufficient documentation

## 2012-07-28 LAB — POCT URINALYSIS DIP (DEVICE)
Bilirubin Urine: NEGATIVE
Hgb urine dipstick: NEGATIVE
Nitrite: NEGATIVE
Protein, ur: NEGATIVE mg/dL
pH: 7 (ref 5.0–8.0)

## 2012-07-28 MED ORDER — PROGESTERONE MICRONIZED 200 MG PO CAPS: ORAL_CAPSULE | ORAL | Status: DC

## 2012-07-28 NOTE — Progress Notes (Signed)
US shows Cx 2.2 cm with 0.7 funnel.  Will rpt in one week with MFM.  Will start prometrium.  Cervix closed with tone.  No tension on stitch.  Pt to be taken out of work.

## 2012-07-28 NOTE — Progress Notes (Signed)
P= 71 Pain and pressure across abdomen, lower back and vaginal area

## 2012-07-28 NOTE — Progress Notes (Signed)
U/S scheduled at [facility] on Sept. 12, 2013 at 115pm.

## 2012-07-29 ENCOUNTER — Encounter: Payer: Self-pay | Admitting: Obstetrics and Gynecology

## 2012-07-29 ENCOUNTER — Encounter (HOSPITAL_COMMUNITY): Payer: Self-pay | Admitting: Obstetrics & Gynecology

## 2012-08-01 ENCOUNTER — Inpatient Hospital Stay (HOSPITAL_COMMUNITY)
Admission: AD | Admit: 2012-08-01 | Discharge: 2012-08-01 | Disposition: A | Discharge status: Home / Self Care | Payer: Medicaid Other | Source: Ambulatory Visit | Attending: Family Medicine | Admitting: Family Medicine

## 2012-08-01 ENCOUNTER — Inpatient Hospital Stay (HOSPITAL_COMMUNITY): Payer: Medicaid Other

## 2012-08-01 ENCOUNTER — Encounter (HOSPITAL_COMMUNITY): Payer: Self-pay | Admitting: *Deleted

## 2012-08-01 ENCOUNTER — Telehealth: Payer: Self-pay | Admitting: *Deleted

## 2012-08-01 DIAGNOSIS — O3430 Maternal care for cervical incompetence, unspecified trimester: Secondary | ICD-10-CM | POA: Insufficient documentation

## 2012-08-01 DIAGNOSIS — O09899 Supervision of other high risk pregnancies, unspecified trimester: Secondary | ICD-10-CM

## 2012-08-01 DIAGNOSIS — R109 Unspecified abdominal pain: Secondary | ICD-10-CM | POA: Insufficient documentation

## 2012-08-01 DIAGNOSIS — O26899 Other specified pregnancy related conditions, unspecified trimester: Secondary | ICD-10-CM

## 2012-08-01 DIAGNOSIS — O0990 Supervision of high risk pregnancy, unspecified, unspecified trimester: Secondary | ICD-10-CM

## 2012-08-01 DIAGNOSIS — O99891 Other specified diseases and conditions complicating pregnancy: Secondary | ICD-10-CM | POA: Insufficient documentation

## 2012-08-01 LAB — URINALYSIS, ROUTINE W REFLEX MICROSCOPIC
Bilirubin Urine: NEGATIVE
Hgb urine dipstick: NEGATIVE
Specific Gravity, Urine: <1.005 — ABNORMAL LOW (ref 1.005–1.030)
Urobilinogen, UA: 0.2 mg/dL (ref 0.0–1.0)
pH: 6.5 (ref 5.0–8.0)

## 2012-08-01 LAB — WET PREP, GENITAL: Clue Cells Wet Prep HPF POC: NONE SEEN

## 2012-08-01 MED ORDER — HYDROCODONE-ACETAMINOPHEN 5-325 MG PO TABS: 2.0000 | ORAL_TABLET | ORAL | Status: AC | PRN

## 2012-08-01 MED ORDER — OXYCODONE-ACETAMINOPHEN 5-325 MG PO TABS
1.0000 | ORAL_TABLET | Freq: Once | ORAL | Status: AC
  Administered 2012-08-01: 1 via ORAL
  Filled 2012-08-01: qty 1

## 2012-08-01 NOTE — MAU Note (Signed)
Patient complains of lots of pressure in between the legs. Contractions while walking and moving around. Back pain

## 2012-08-01 NOTE — MAU Provider Note (Signed)
History     CSN: [id number]  Arrival date and time: 08/01/12 1947   First Provider Initiated Contact with Patient 08/01/12 2015      Chief Complaint  Patient presents with  . Contractions  . Back Pain   HPI [name] is a 36 y.o. female @ [redacted]w[redacted]d gestation who presents to MAU with contractions. The pain started earlier today. She rates the pain as 7/10. She describes the pain as cramping, contraction like pain and a feeling of pressure in the vaginal area. Associated symptoms include low back pain. Had cerclage placed 06/30/12 by Dr. Shawnie Pons. Hx of PTD @ [redacted] weeks gestation last year. Had cerclage placed one week prior to delivery by Dr. Shawnie Pons. The history was provided by the patient.    OB History    Grav Para Term Preterm Abortions TAB SAB Ect Mult Living   7 3 2 1 3 2 1  0 0 2      Past Medical History  Diagnosis Date  . Anxiety   . GERD (gastroesophageal reflux disease)   . STD (female)     HX of STD's  . Abnormal Pap smear 1998    had colpo  . HCV infection     HX of Hep C  . 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 Bicornuate uterus   . Depression   . Hypertension 2010  . Bicornate uterus   . Headache   . Chlamydia 2000  . Asthma     childhood asthma  . PONV (postoperative nausea and vomiting)   . IUP (intrauterine pregnancy), incidental 13 weeks    Past Surgical History  Procedure Date  . Therapeutic abortion   . Therapeutic abortion   . Tonsillectomy   . Dilation and curettage of uterus     SAB  . Finger surgery 2003    reattachment of right forefinger  . Cervical cerclage 09/01/2011    Procedure: CERCLAGE CERVICAL;  Surgeon: Reva Bores, MD;  Location: [facility];  Service: Gynecology;  Laterality: N/A;  . Cervical cerclage 06/28/2012    Procedure: CERCLAGE CERVICAL;  Surgeon: Reva Bores, MD;  Location: [facility];  Service: Gynecology;  Laterality: N/A;    Family History  Problem Relation Age of Onset  . Hypertension Mother   . Thyroid  disease Maternal Grandmother   . Diabetes Paternal Grandfather   . Anesthesia problems Neg Hx   . Other Neg Hx     History  Substance Use Topics  . Smoking status: Current Some Day Smoker -- 20 years    Types: Cigarettes  . Smokeless tobacco: Former Neurosurgeon    Quit date: 05/24/2012   Comment: nicotine patch currently  . Alcohol Use: No    Allergies: No Known Allergies  Prescriptions prior to admission  Medication Sig Dispense Refill  . labetalol (NORMODYNE) 200 MG tablet Take 1 tablet (200 mg total) by mouth 2 (two) times daily.  60 tablet  2  . progesterone (PROMETRIUM) 200 MG capsule One capsule per vagina nightly  30 capsule  5  . acetaminophen (TYLENOL) 500 MG tablet Take 500 mg by mouth every 6 (six) hours as needed. pain      . calcium carbonate (TUMS - DOSED IN MG ELEMENTAL CALCIUM) 500 MG chewable tablet Chew 1 tablet by mouth at bedtime as needed. For heartburn      . famotidine (PEPCID) 20 MG tablet Take 20 mg by mouth 2 (two) times daily as needed. For  heartburn       . indomethacin (INDOCIN) 50 MG capsule Take 1 capsule (50 mg total) by mouth 4 (four) times daily.  12 capsule  0  . nicotine (NICODERM CQ - DOSED IN MG/24 HOURS) 14 mg/24hr patch Place 1 patch onto the skin daily.      . Prenatal Vit-Fe Fumarate-FA (PRENATAL MULTIVITAMIN) TABS Take 1 tablet by mouth daily.        Review of Systems  Constitutional: Negative for fever, chills and weight loss.  HENT: Negative for ear pain, nosebleeds, congestion, sore throat and neck pain.   Eyes: Negative for blurred vision, double vision, photophobia and pain.  Respiratory: Negative for cough, shortness of breath and wheezing.   Cardiovascular: Negative for chest pain, palpitations and leg swelling.  Gastrointestinal: Positive for heartburn, nausea and abdominal pain. Negative for vomiting, diarrhea and constipation.  Genitourinary: Positive for frequency. Negative for dysuria and urgency.  Musculoskeletal: Positive for  back pain. Negative for myalgias.  Skin: Negative for itching and rash.  Neurological: Positive for headaches. Negative for dizziness, sensory change, speech change, seizures and weakness.  Endo/Heme/Allergies: Does not bruise/bleed easily.  Psychiatric/Behavioral: Positive for depression. The patient is nervous/anxious (not on any medication). The patient does not have insomnia.    Physical Exam   Blood pressure 165/100, pulse 87, temperature 98.4 F (36.9 C), temperature source Oral, resp. rate 20, height 5\' 2"  (1.575 m), weight 169 lb (76.658 kg), last menstrual period 03/22/2012.  Physical Exam  Nursing note and vitals reviewed. Constitutional: She is oriented to person, place, and time. She appears well-developed and well-nourished. No distress.       Elevated blood pressure.  HENT:  Head: Normocephalic and atraumatic.  Eyes: EOM are normal.  Neck: Neck supple.  Cardiovascular: Normal rate.   Respiratory: Effort normal.  GI: Soft. There is no tenderness.  Genitourinary:       External genitalia without lesions. White discharge vaginal vault. Cervix with cerclage in place. Cervix closed. Uterus consistent with dates.   Musculoskeletal: Normal range of motion.  Neurological: She is alert and oriented to person, place, and time.  Skin: Skin is warm and dry.  Psychiatric: She has a normal mood and affect. Her behavior is normal. Judgment and thought content normal.   Results for orders placed during the hospital encounter of 08/01/12 (from the past 24 hour(s))  URINALYSIS, ROUTINE W REFLEX MICROSCOPIC     Status: Abnormal   Collection Time   08/01/12  7:50 PM      Component Value Range   Color, Urine YELLOW  YELLOW   APPearance CLEAR  CLEAR   Specific Gravity, Urine <1.005 (*) 1.005 - 1.030   pH 6.5  5.0 - 8.0   Glucose, UA NEGATIVE  NEGATIVE mg/dL   Hgb urine dipstick NEGATIVE  NEGATIVE   Bilirubin Urine NEGATIVE  NEGATIVE   Ketones, ur NEGATIVE  NEGATIVE mg/dL   Protein, ur  NEGATIVE  NEGATIVE mg/dL   Urobilinogen, UA 0.2  0.0 - 1.0 mg/dL   Nitrite NEGATIVE  NEGATIVE   Leukocytes, UA NEGATIVE  NEGATIVE  WET PREP, GENITAL     Status: Abnormal   Collection Time   08/01/12  8:35 PM      Component Value Range   Yeast Wet Prep HPF POC NONE SEEN  NONE SEEN   Trich, Wet Prep NONE SEEN  NONE SEEN   Clue Cells Wet Prep HPF POC NONE SEEN  NONE SEEN   WBC, Wet Prep HPF POC  FEW (*) NONE SEEN   MAU Course: @ 20:30 discussed with Dr. Shawnie Pons.  Procedures US Ob Limited  08/01/2012  *RADIOLOGY REPORT*  Clinical Data: History of preterm delivery at 21 weeks.  Cerclage. Estimated gestational age by LMP is 18 weeks 6 days.  LIMITED OBSTETRIC ULTRASOUND  Number of Fetuses: 1 Heart Rate: 143 bpm Movement: Fetal movement is demonstrated. Presentation: Fetus is in cephalic presentation. Placental Location: Placenta is posterior. Previa: No previa or abruption. Amniotic Fluid (Subjective): Normal  Vertical pocket:  4.74cm  BPD: 4.57cm   19w   6d  EDC: 12/20/2012  MATERNAL FINDINGS: Cervix: The cervix is closed with the length measurement of 2 cm. There is debris at the internal os of nonspecific etiology.  Uterus/Adnexae: Bicornuate uterus.  Pregnancy is located towards the left horn.  Both ovaries are identified with limited visualization.  No abnormal adnexal masses visualized.  IMPRESSION: Single intrauterine pregnancy.  Cervix is closed with length measurement of 2 cm.  Debris is noted in the internal loss.  Recommend followup with non-emergent complete OB 14+ wk US examination for fetal biometric evaluation and anatomic survey if not already performed.   Original Report Authenticated By: Marlon Pel, M.D.    US Ob Transvaginal  08/01/2012  *RADIOLOGY REPORT*  TRANSVAGINAL OB ULTRASOUND  Technique:  Transvaginal ultrasound was performed for evaluation of the gestation as well as the maternal uterus and adnexal regions.  This report is combined with report of ultrasound may be limited  transabdominal views obtained on the same day.  See additional report.   Original Report Authenticated By: Marlon Pel, M.D.    Assessment: 36 y.o. female @ [redacted]w[redacted]d gestation with abdominal pain   Plan:  Discussed with Dr. Shawnie Pons, Dr. Shawnie Pons in to se patient   Percocet 5/325 mg po now and Rx for home   Bedrest, out of work   Follow up as scheduled, return here as needed..  Medication List  As of 08/01/2012 11:19 PM   START taking these medications         HYDROcodone-acetaminophen 5-325 MG per tablet   Commonly known as: NORCO/VICODIN   Take 2 tablets by mouth every 4 (four) hours as needed for pain.         CONTINUE taking these medications         acetaminophen 500 MG tablet   Commonly known as: TYLENOL      calcium carbonate 500 MG chewable tablet   Commonly known as: TUMS - dosed in mg elemental calcium      famotidine 20 MG tablet   Commonly known as: PEPCID      labetalol 200 MG tablet   Commonly known as: NORMODYNE   Take 1 tablet (200 mg total) by mouth 2 (two) times daily.      prenatal multivitamin Tabs      progesterone 200 MG capsule   Commonly known as: PROMETRIUM   One capsule per vagina nightly          Where to get your medications    These are the prescriptions that you need to pick up.   You may get these medications from any pharmacy.         HYDROcodone-acetaminophen 5-325 MG per tablet          Discussed with the patient and all questioned fully answered. She will follow up in [facility] or return here if any problems arise.  Ulrich Soules, RN, FNP, [facility] 08/01/2012, 8:16 PM

## 2012-08-01 NOTE — Telephone Encounter (Signed)
Called and left message for pt on her personal voice mail. I stated that this medication may not be appropriate for all pregnant women and that is why the bottle has the warning. It is not harmful. It is most definitely appropriate and needed in her situation because of her Hx and current status of her cervix. Please call back if you have additional questions. Also, I stated the information of her next appts as a reminder: 9/12 @ 1315 for [place] @ [facility], 9/19 @ 0845 for prenatal visit @ clinic.

## 2012-08-01 NOTE — Telephone Encounter (Signed)
Pt left a message that she is concerned about using the Progesterone that she inserts nightly. She is worried because the bottle says ask a doctor before using if you are pregnant. She wants to talk about using this medicine.

## 2012-08-02 NOTE — MAU Provider Note (Signed)
Chart reviewed and agree with management and plan.  

## 2012-08-03 LAB — GC/CHLAMYDIA PROBE AMP, GENITAL: GC Probe Amp, Genital: NEGATIVE

## 2012-08-04 ENCOUNTER — Ambulatory Visit (HOSPITAL_COMMUNITY)
Admission: RE | Admit: 2012-08-04 | Discharge: 2012-08-04 | Disposition: A | Discharge status: Home / Self Care | Payer: Medicaid Other | Source: Ambulatory Visit | Attending: Obstetrics & Gynecology | Admitting: Obstetrics & Gynecology

## 2012-08-04 VITALS — BP 129/94 | HR 85 | Wt 169.0 lb

## 2012-08-04 DIAGNOSIS — O0990 Supervision of high risk pregnancy, unspecified, unspecified trimester: Secondary | ICD-10-CM

## 2012-08-04 DIAGNOSIS — O09299 Supervision of pregnancy with other poor reproductive or obstetric history, unspecified trimester: Secondary | ICD-10-CM | POA: Insufficient documentation

## 2012-08-04 DIAGNOSIS — O10019 Pre-existing essential hypertension complicating pregnancy, unspecified trimester: Secondary | ICD-10-CM | POA: Insufficient documentation

## 2012-08-04 DIAGNOSIS — O09899 Supervision of other high risk pregnancies, unspecified trimester: Secondary | ICD-10-CM

## 2012-08-04 DIAGNOSIS — O3440 Maternal care for other abnormalities of cervix, unspecified trimester: Secondary | ICD-10-CM | POA: Insufficient documentation

## 2012-08-04 DIAGNOSIS — O3430 Maternal care for cervical incompetence, unspecified trimester: Secondary | ICD-10-CM

## 2012-08-04 DIAGNOSIS — O3400 Maternal care for unspecified congenital malformation of uterus, unspecified trimester: Secondary | ICD-10-CM | POA: Insufficient documentation

## 2012-08-08 ENCOUNTER — Telehealth: Payer: Self-pay | Admitting: Medical

## 2012-08-08 NOTE — Telephone Encounter (Signed)
Called Dr. Shawnie Pons about this patient. She said the Vicodin was not meant to be a continuous medication. She does not want to give her percocet, but we can re-evaluate her pain at her next appointment on 08/11/12.

## 2012-08-08 NOTE — Telephone Encounter (Signed)
Pt left new message @ 1436 stating that she is still waiting for call back regarding obtaining Percocet Rx.

## 2012-08-08 NOTE — Telephone Encounter (Signed)
Patient called stating that she was given vicodin by Dr. Shawnie Pons and it is upsetting her stomach. She was given percocet while in house and would like that instead because that does not upset her stomach.

## 2012-08-09 NOTE — Telephone Encounter (Signed)
Spoke with patient. She had presented to the window earlier today after receiving the automated message for her appointment on Thursday, she misunderstood and thought it was this morning. The front office staff had read her the previous telephone notes regarding my conversation with Dr. Shawnie Pons yesterday. The patient voices understanding of the need and use for the pain medication and is not taking it continuously. She states that she wanted to have something in case she had the pain again and would no longer take the Vicodin 2/2 GI upset. The patient plans to keep her appointment for 08/11/12 and will be re-evaluated for pain at this time. The patient did not have any further questions at this time.

## 2012-08-11 ENCOUNTER — Encounter: Payer: Self-pay | Admitting: Obstetrics and Gynecology

## 2012-08-11 ENCOUNTER — Ambulatory Visit (INDEPENDENT_AMBULATORY_CARE_PROVIDER_SITE_OTHER): Payer: Medicaid Other | Admitting: Obstetrics and Gynecology

## 2012-08-11 VITALS — BP 158/109 | Temp 96.9°F | Wt 166.4 lb

## 2012-08-11 DIAGNOSIS — O3430 Maternal care for cervical incompetence, unspecified trimester: Secondary | ICD-10-CM

## 2012-08-11 DIAGNOSIS — I10 Essential (primary) hypertension: Secondary | ICD-10-CM

## 2012-08-11 LAB — POCT URINALYSIS DIP (DEVICE)
Bilirubin Urine: NEGATIVE
Ketones, ur: NEGATIVE mg/dL
Leukocytes, UA: NEGATIVE
pH: 7 (ref 5.0–8.0)

## 2012-08-11 LAB — GLUCOSE, CAPILLARY: Glucose-Capillary: 84 mg/dL (ref 70–99)

## 2012-08-11 MED ORDER — OXYCODONE-ACETAMINOPHEN 5-325 MG PO TABS: 1.0000 | ORAL_TABLET | ORAL | Status: DC | PRN

## 2012-08-11 NOTE — Progress Notes (Signed)
Took labetalol this am. Recheck 152/106.  C/W Dr. Penne Lash re increase to 400 mg bid. On prometrium q night and has cerclage.  CL 3.2. Has F/U CL and growth scans scheduled with MFM. Seen MAU last wk for mid LBP unimproved and constant. Worse when getting up and moving around, positional. Did not tolerate the Norco (vomited). Acetaminophen 1000mg  no help. Rx Percocet #10 only for breakthrough pain and info on RLP. FS glucose done.

## 2012-08-11 NOTE — Patient Instructions (Signed)

## 2012-08-11 NOTE — Progress Notes (Signed)
Pulse- 102  Pain-"back pain that radiates down my leg" Pt c/o severe back pain was given Norco that did not work.  Pt is wanting to know if she could use percocet which was given to her in MAU.

## 2012-08-16 ENCOUNTER — Telehealth: Payer: Self-pay | Admitting: *Deleted

## 2012-08-16 ENCOUNTER — Encounter: Payer: Self-pay | Admitting: *Deleted

## 2012-08-16 ENCOUNTER — Other Ambulatory Visit (HOSPITAL_COMMUNITY): Payer: Self-pay | Admitting: Maternal and Fetal Medicine

## 2012-08-16 DIAGNOSIS — O3430 Maternal care for cervical incompetence, unspecified trimester: Secondary | ICD-10-CM

## 2012-08-16 DIAGNOSIS — O0990 Supervision of high risk pregnancy, unspecified, unspecified trimester: Secondary | ICD-10-CM

## 2012-08-16 DIAGNOSIS — N883 Incompetence of cervix uteri: Secondary | ICD-10-CM

## 2012-08-16 NOTE — Telephone Encounter (Signed)
Patient left a message stating that she has a question for a nurse.  I called patient back and she stated that she needs a note to take to social security office to get assistance since she was taken out of work. I told her I would review her chart and contact her as soon as I had the letter ready. Per note in patients chart by Dr. Penne Lash pt was to be out of work due to short cervix with funneling. Note made for patient and she was informed that she could pick it up.

## 2012-08-18 ENCOUNTER — Inpatient Hospital Stay (HOSPITAL_COMMUNITY)
Admission: AD | Admit: 2012-08-18 | Discharge: 2012-08-18 | Disposition: A | Discharge status: Home / Self Care | Payer: Medicaid Other | Source: Ambulatory Visit | Attending: Obstetrics & Gynecology | Admitting: Obstetrics & Gynecology

## 2012-08-18 ENCOUNTER — Encounter (HOSPITAL_COMMUNITY): Payer: Self-pay | Admitting: *Deleted

## 2012-08-18 ENCOUNTER — Ambulatory Visit (HOSPITAL_COMMUNITY)
Admission: RE | Admit: 2012-08-18 | Discharge: 2012-08-18 | Disposition: A | Discharge status: Home / Self Care | Payer: Medicaid Other | Source: Ambulatory Visit | Attending: Obstetrics & Gynecology | Admitting: Obstetrics & Gynecology

## 2012-08-18 VITALS — BP 138/93 | HR 82 | Wt 169.5 lb

## 2012-08-18 DIAGNOSIS — M545 Low back pain, unspecified: Secondary | ICD-10-CM | POA: Insufficient documentation

## 2012-08-18 DIAGNOSIS — N949 Unspecified condition associated with female genital organs and menstrual cycle: Secondary | ICD-10-CM | POA: Insufficient documentation

## 2012-08-18 DIAGNOSIS — N883 Incompetence of cervix uteri: Secondary | ICD-10-CM

## 2012-08-18 DIAGNOSIS — O10019 Pre-existing essential hypertension complicating pregnancy, unspecified trimester: Secondary | ICD-10-CM | POA: Insufficient documentation

## 2012-08-18 DIAGNOSIS — O0990 Supervision of high risk pregnancy, unspecified, unspecified trimester: Secondary | ICD-10-CM

## 2012-08-18 DIAGNOSIS — O09299 Supervision of pregnancy with other poor reproductive or obstetric history, unspecified trimester: Secondary | ICD-10-CM | POA: Insufficient documentation

## 2012-08-18 DIAGNOSIS — O3430 Maternal care for cervical incompetence, unspecified trimester: Secondary | ICD-10-CM | POA: Insufficient documentation

## 2012-08-18 DIAGNOSIS — O3440 Maternal care for other abnormalities of cervix, unspecified trimester: Secondary | ICD-10-CM | POA: Insufficient documentation

## 2012-08-18 DIAGNOSIS — O3400 Maternal care for unspecified congenital malformation of uterus, unspecified trimester: Secondary | ICD-10-CM | POA: Insufficient documentation

## 2012-08-18 LAB — WET PREP, GENITAL
Clue Cells Wet Prep HPF POC: NONE SEEN
Trich, Wet Prep: NONE SEEN

## 2012-08-18 MED ORDER — PROMETHAZINE HCL 25 MG/ML IJ SOLN: 25.0000 mg | Freq: Once | INTRAMUSCULAR | Status: DC

## 2012-08-18 MED ORDER — PROMETHAZINE HCL 25 MG/ML IJ SOLN
25.0000 mg | Freq: Once | INTRAMUSCULAR | Status: AC
  Administered 2012-08-18: 25 mg via INTRAMUSCULAR
  Filled 2012-08-18: qty 1

## 2012-08-18 MED ORDER — OXYCODONE-ACETAMINOPHEN 5-325 MG PO TABS: 1.0000 | ORAL_TABLET | Freq: Four times a day (QID) | ORAL | Status: DC | PRN

## 2012-08-18 NOTE — MAU Provider Note (Signed)
History     CSN: [id number]  Arrival date and time: 08/18/12 1039   None     Chief Complaint  Patient presents with  . Pelvic Pain   HPI 36 y.o. [id number] at 102w2d sent from ultrasound for decreased cervical length. Having some cramping last night, thought it was GI. Also having low back pain tonight. No leakage of fluid or bleeding. Baby moving well.    OB History    Grav Para Term Preterm Abortions TAB SAB Ect Mult Living   7 3 2 1 3 2 1  0 0 2      Past Medical History  Diagnosis Date  . Anxiety   . GERD (gastroesophageal reflux disease)   . STD (female)     HX of STD's  . Abnormal Pap smear 1998    had colpo  . HCV infection     HX of Hep C  . 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 Bicornuate uterus   . Depression   . Hypertension 2010  . Bicornate uterus   . Headache   . Chlamydia 2000  . Asthma     childhood asthma  . PONV (postoperative nausea and vomiting)   . IUP (intrauterine pregnancy), incidental 13 weeks    Past Surgical History  Procedure Date  . Therapeutic abortion   . Therapeutic abortion   . Tonsillectomy   . Dilation and curettage of uterus     SAB  . Finger surgery 2003    reattachment of right forefinger  . Cervical cerclage 09/01/2011    Procedure: CERCLAGE CERVICAL;  Surgeon: Reva Bores, MD;  Location: [facility];  Service: Gynecology;  Laterality: N/A;  . Cervical cerclage 06/28/2012    Procedure: CERCLAGE CERVICAL;  Surgeon: Reva Bores, MD;  Location: [facility];  Service: Gynecology;  Laterality: N/A;    Family History  Problem Relation Age of Onset  . Hypertension Mother   . Thyroid disease Maternal Grandmother   . Diabetes Paternal Grandfather   . Anesthesia problems Neg Hx   . Other Neg Hx     History  Substance Use Topics  . Smoking status: Current Some Day Smoker -- 0.5 packs/day for 20 years    Types: Cigarettes  . Smokeless tobacco: Former Neurosurgeon    Quit date: 05/24/2012   Comment: nicotine patch  currently  . Alcohol Use: No    Allergies: No Known Allergies  Prescriptions prior to admission  Medication Sig Dispense Refill  . acetaminophen (TYLENOL) 500 MG tablet Take 500 mg by mouth every 6 (six) hours as needed. pain      . calcium carbonate (TUMS - DOSED IN MG ELEMENTAL CALCIUM) 500 MG chewable tablet Chew 1 tablet by mouth at bedtime as needed. For heartburn      . famotidine (PEPCID) 20 MG tablet Take 20 mg by mouth 2 (two) times daily as needed. For heartburn       . labetalol (NORMODYNE) 200 MG tablet Take 1 tablet (200 mg total) by mouth 2 (two) times daily.  60 tablet  2  . oxyCODONE-acetaminophen (PERCOCET/ROXICET) 5-325 MG per tablet Take 1 tablet by mouth every 4 (four) hours as needed for pain.  10 tablet  0  . Prenatal Vit-Fe Fumarate-FA (PRENATAL MULTIVITAMIN) TABS Take 1 tablet by mouth daily.      . progesterone (PROMETRIUM) 200 MG capsule One capsule per vagina nightly  30 capsule  5    Review of Systems  Constitutional:  Negative for fever and chills.  Eyes: Negative for blurred vision and double vision.  Respiratory: Negative for cough and shortness of breath.   Gastrointestinal: Positive for heartburn, nausea, vomiting, abdominal pain and constipation. Negative for diarrhea.  Genitourinary: Negative for dysuria, urgency and frequency.  Musculoskeletal: Positive for back pain.  Neurological: Negative for dizziness and headaches.   Physical Exam   Blood pressure 140/97, pulse 75, height 5\' 2"  (1.575 m), weight 76.023 kg (167 lb 9.6 oz), last menstrual period 03/22/2012.  Physical Exam  Constitutional: She is oriented to person, place, and time. She appears well-developed and well-nourished. No distress.  HENT:  Head: Normocephalic and atraumatic.  Eyes: Conjunctivae normal and EOM are normal.  Neck: Normal range of motion. Neck supple.  Cardiovascular: Normal rate, regular rhythm and normal heart sounds.   Respiratory: Effort normal and breath sounds  normal. No respiratory distress.  GI: Soft. There is no tenderness. There is no rebound and no guarding.       Gravid  Genitourinary:       Normal external genitalia. White watery discharge with thick chunks. Cervix 0.5 cm open at external os, no membranes visible, cerclage tape visualized about 1 cm from end of cervix.  Musculoskeletal: She exhibits no edema and no tenderness.  Neurological: She is alert and oriented to person, place, and time.  Skin: Skin is warm and dry.  Psychiatric: She has a normal mood and affect.    MAU Course  Procedures  Results for orders placed during the hospital encounter of 08/18/12 (from the past 24 hour(s))  WET PREP, GENITAL     Status: Abnormal   Collection Time   08/18/12 11:34 AM      Component Value Range   Yeast Wet Prep HPF POC NONE SEEN  NONE SEEN   Trich, Wet Prep NONE SEEN  NONE SEEN   Clue Cells Wet Prep HPF POC NONE SEEN  NONE SEEN   WBC, Wet Prep HPF POC FEW (*) NONE SEEN   Negative Fern.  Phenergan given in ED - nausea improved.  Assessment and Plan  36 y.o. [id number] at [redacted]w[redacted]d with incompetent cervix and cerclage in place.  Cervical length now 0.4 cm. Pt desires to continue with cerclage. Discussed risks of preterm labor. Patient instructed to return to ED for signs/symptoms of labor so cerclage can be removed.    Napoleon Form 08/18/2012, 11:25 AM

## 2012-08-18 NOTE — ED Notes (Signed)
MAU charge RN notified of pt to arrive in mau in approx 30 min.

## 2012-08-18 NOTE — MAU Note (Signed)
Pt sent from MFM for further eval. Cervix very thin on u/s. Pt c/o increase pelvic pressure

## 2012-08-18 NOTE — Progress Notes (Signed)
Ms. [name] had an ultrasound appointment today.  Please see [facility]-OB/GYN report for details.  Comments There is an active singleton fetus with no apparent dysmorphic features on today's routine anatomic re-examination.   The patient notes that she has had no bleeding but had abdominal cramping last night.  Impression Active singleton fetus. Cervical insufficiency Normal amniotic fluid volume  Today's endovaginal ultrasound imaging demonstrates an intact cerclage in a cervix that is funneled and measures 3.4 to 4.5 mm.  There is echogenicity representing sludge sitting within the apex of the funnel just superior to the remaining cervix.    Recommendations I spoke to attending faculty managing labor and delivery regarding this concerning examination.  The patient was sent to MAU for further evaluation and admission.  Rogelia Boga, MD, MS, FACOG Assistant Professor Section of Maternal-Fetal Medicine [facility]

## 2012-08-19 ENCOUNTER — Encounter (HOSPITAL_COMMUNITY): Payer: Self-pay | Admitting: *Deleted

## 2012-08-19 ENCOUNTER — Inpatient Hospital Stay (HOSPITAL_COMMUNITY)
Admission: AD | Admit: 2012-08-19 | Discharge: 2012-08-19 | Disposition: A | Discharge status: Home / Self Care | Payer: Medicaid Other | Source: Ambulatory Visit | Attending: Obstetrics & Gynecology | Admitting: Obstetrics & Gynecology

## 2012-08-19 DIAGNOSIS — O0990 Supervision of high risk pregnancy, unspecified, unspecified trimester: Secondary | ICD-10-CM

## 2012-08-19 DIAGNOSIS — R109 Unspecified abdominal pain: Secondary | ICD-10-CM | POA: Insufficient documentation

## 2012-08-19 DIAGNOSIS — O3430 Maternal care for cervical incompetence, unspecified trimester: Secondary | ICD-10-CM | POA: Insufficient documentation

## 2012-08-19 NOTE — MAU Note (Signed)
Pt states she no longer feels uc's @ this time.

## 2012-08-19 NOTE — MAU Note (Signed)
Patient states she woke up this morning nauseated and having more painful contractions. Denies any bleeding or leaking.

## 2012-08-19 NOTE — MAU Provider Note (Signed)
History     CSN: [id number]  Arrival date and time: 08/19/12 9147   None     Chief Complaint  Patient presents with  . Labor Eval   HPI Pt is  36 y.o. [id number] at [redacted]w[redacted]d who comes in the abdominal/pelvic cramping since 6 AM this morning. No vaginal bleeding or loss of fluid.  Cramping has stopped since arriving at MAU.  Pt has cerclage in place and was seen yesterday in MAU after sono showed cervical shortening to 0.4 cm and possible slippage of cerclage.  She opted for expectant management and was discharged home.    OB History    Grav Para Term Preterm Abortions TAB SAB Ect Mult Living   7 3 2 1 3 2 1  0 0 2      Past Medical History  Diagnosis Date  . Anxiety   . GERD (gastroesophageal reflux disease)   . STD (female)     HX of STD's  . Abnormal Pap smear 1998    had colpo  . HCV infection     HX of Hep C  . 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 Bicornuate uterus   . Depression   . Hypertension 2010  . Bicornate uterus   . Headache   . Chlamydia 2000  . Asthma     childhood asthma  . PONV (postoperative nausea and vomiting)   . IUP (intrauterine pregnancy), incidental 13 weeks    Past Surgical History  Procedure Date  . Therapeutic abortion   . Therapeutic abortion   . Tonsillectomy   . Dilation and curettage of uterus     SAB  . Finger surgery 2003    reattachment of right forefinger  . Cervical cerclage 09/01/2011    Procedure: CERCLAGE CERVICAL;  Surgeon: Reva Bores, MD;  Location: [facility];  Service: Gynecology;  Laterality: N/A;  . Cervical cerclage 06/28/2012    Procedure: CERCLAGE CERVICAL;  Surgeon: Reva Bores, MD;  Location: [facility];  Service: Gynecology;  Laterality: N/A;    Family History  Problem Relation Age of Onset  . Hypertension Mother   . Thyroid disease Maternal Grandmother   . Diabetes Paternal Grandfather   . Anesthesia problems Neg Hx   . Other Neg Hx     History  Substance Use Topics  . Smoking status:  Current Some Day Smoker -- 0.5 packs/day for 20 years    Types: Cigarettes  . Smokeless tobacco: Former Neurosurgeon    Quit date: 05/24/2012   Comment: nicotine patch currently  . Alcohol Use: No    Allergies:  Allergies  Allergen Reactions  . Vicodin (Hydrocodone-Acetaminophen) Nausea And Vomiting    Prescriptions prior to admission  Medication Sig Dispense Refill  . acetaminophen (TYLENOL) 500 MG tablet Take 500 mg by mouth every 6 (six) hours as needed. pain      . calcium carbonate (TUMS - DOSED IN MG ELEMENTAL CALCIUM) 500 MG chewable tablet Chew 1 tablet by mouth at bedtime as needed. For heartburn      . famotidine (PEPCID) 20 MG tablet Take 20 mg by mouth 2 (two) times daily as needed. For heartburn       . labetalol (NORMODYNE) 200 MG tablet Take 1 tablet (200 mg total) by mouth 2 (two) times daily.  60 tablet  2  . oxyCODONE-acetaminophen (PERCOCET/ROXICET) 5-325 MG per tablet Take 1-2 tablets by mouth every 6 (six) hours as needed for pain.  20 tablet  0  . Prenatal Vit-Fe Fumarate-FA (PRENATAL MULTIVITAMIN) TABS Take 1 tablet by mouth daily.      . progesterone (PROMETRIUM) 200 MG capsule One capsule per vagina nightly  30 capsule  5    Review of Systems  Constitutional: Negative for fever and chills.  Eyes: Negative for blurred vision and double vision.  Gastrointestinal: Positive for nausea, vomiting and abdominal pain. Negative for diarrhea.  Genitourinary: Negative for dysuria.  Musculoskeletal: Positive for back pain.  Neurological: Negative for dizziness and headaches.   Physical Exam   Blood pressure 124/88, pulse 78, temperature 98 F (36.7 C), resp. rate 18, last menstrual period 03/22/2012, SpO2 99.00%.  Physical Exam  Constitutional: She appears well-developed and well-nourished.  HENT:  Head: Normocephalic and atraumatic.  Eyes: Conjunctivae normal and EOM are normal.  Neck: Normal range of motion. Neck supple.  Cardiovascular: Normal rate and regular  rhythm.   Respiratory: Effort normal. No respiratory distress.  GI: Soft. There is no tenderness. There is no rebound and no guarding.  Genitourinary: Vagina normal.       Cervix minimally dilated (< 0.5 cm). No visible membranes or bleeding. Cerclage in place and intact.    MAU Course  Procedures    Assessment and Plan  36 y.o. [id number] at [redacted]w[redacted]d with incompetent cervix, cerclage presenting with cramping.  No cervical change, cerclage intact. Cramping has ceased. Pt discharged home with labor precautions.   Napoleon Form 08/19/2012, 9:06 AM

## 2012-08-25 ENCOUNTER — Encounter: Payer: Medicaid Other | Admitting: Obstetrics & Gynecology

## 2012-08-26 ENCOUNTER — Encounter (HOSPITAL_COMMUNITY): Payer: Self-pay | Admitting: *Deleted

## 2012-08-26 ENCOUNTER — Inpatient Hospital Stay (HOSPITAL_COMMUNITY)
Admission: AD | Admit: 2012-08-26 | Discharge: 2012-08-26 | Disposition: A | Discharge status: Home / Self Care | Payer: Medicaid Other | Source: Ambulatory Visit | Attending: Obstetrics & Gynecology | Admitting: Obstetrics & Gynecology

## 2012-08-26 DIAGNOSIS — F319 Bipolar disorder, unspecified: Secondary | ICD-10-CM

## 2012-08-26 DIAGNOSIS — B1920 Unspecified viral hepatitis C without hepatic coma: Secondary | ICD-10-CM

## 2012-08-26 DIAGNOSIS — IMO0002 Reserved for concepts with insufficient information to code with codable children: Secondary | ICD-10-CM

## 2012-08-26 DIAGNOSIS — O3430 Maternal care for cervical incompetence, unspecified trimester: Secondary | ICD-10-CM

## 2012-08-26 DIAGNOSIS — Z87898 Personal history of other specified conditions: Secondary | ICD-10-CM

## 2012-08-26 DIAGNOSIS — Q513 Bicornate uterus: Secondary | ICD-10-CM

## 2012-08-26 DIAGNOSIS — O0990 Supervision of high risk pregnancy, unspecified, unspecified trimester: Secondary | ICD-10-CM

## 2012-08-26 DIAGNOSIS — O169 Unspecified maternal hypertension, unspecified trimester: Secondary | ICD-10-CM

## 2012-08-26 DIAGNOSIS — I10 Essential (primary) hypertension: Secondary | ICD-10-CM

## 2012-08-26 NOTE — MAU Provider Note (Signed)
I was present for the exam and agree with above.  [place], CNM 08/26/2012 8:14 PM

## 2012-08-26 NOTE — MAU Note (Signed)
Patient was not feeling baby move today, but did feel movement while in lobby.

## 2012-08-26 NOTE — MAU Provider Note (Signed)
History     CSN: [id number]  Arrival date and time: 08/26/12 1330   First Provider Initiated Contact with Patient 08/26/12 1403      No chief complaint on file.  HPI Patient presented today for decreased fetal movement. She has had a fetal demise prior and was nervous. She currently has a thinning cervix, of which she is on bed rest. She had not felt her baby move all day and wanted to hear the heartbeat. Denies cramping, leaking or gush of fluid. No bleeding.    Past Medical History  Diagnosis Date  . Anxiety   . GERD (gastroesophageal reflux disease)   . STD (female)     HX of STD's  . Abnormal Pap smear 1998    had colpo  . HCV infection     HX of Hep C  . 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 Bicornuate uterus   . Depression   . Hypertension 2010  . Bicornate uterus   . Headache   . Chlamydia 2000  . Asthma     childhood asthma  . PONV (postoperative nausea and vomiting)   . IUP (intrauterine pregnancy), incidental 13 weeks    Past Surgical History  Procedure Date  . Therapeutic abortion   . Therapeutic abortion   . Tonsillectomy   . Dilation and curettage of uterus     SAB  . Finger surgery 2003    reattachment of right forefinger  . Cervical cerclage 09/01/2011    Procedure: CERCLAGE CERVICAL;  Surgeon: Reva Bores, MD;  Location: [facility];  Service: Gynecology;  Laterality: N/A;  . Cervical cerclage 06/28/2012    Procedure: CERCLAGE CERVICAL;  Surgeon: Reva Bores, MD;  Location: [facility];  Service: Gynecology;  Laterality: N/A;    Family History  Problem Relation Age of Onset  . Hypertension Mother   . Thyroid disease Maternal Grandmother   . Diabetes Paternal Grandfather   . Anesthesia problems Neg Hx   . Other Neg Hx     History  Substance Use Topics  . Smoking status: Current Some Day Smoker -- 0.5 packs/day for 20 years    Types: Cigarettes  . Smokeless tobacco: Former Neurosurgeon    Quit date: 05/24/2012   Comment: nicotine patch  currently  . Alcohol Use: No    Allergies:  Allergies  Allergen Reactions  . Vicodin (Hydrocodone-Acetaminophen) Nausea And Vomiting    Prescriptions prior to admission  Medication Sig Dispense Refill  . acetaminophen (TYLENOL) 500 MG tablet Take 500 mg by mouth every 6 (six) hours as needed. pain      . calcium carbonate (TUMS - DOSED IN MG ELEMENTAL CALCIUM) 500 MG chewable tablet Chew 1 tablet by mouth at bedtime as needed. For heartburn      . famotidine (PEPCID) 20 MG tablet Take 20 mg by mouth 2 (two) times daily as needed. For heartburn       . labetalol (NORMODYNE) 200 MG tablet Take 1 tablet (200 mg total) by mouth 2 (two) times daily.  60 tablet  2  . oxyCODONE-acetaminophen (PERCOCET/ROXICET) 5-325 MG per tablet Take 1-2 tablets by mouth every 6 (six) hours as needed for pain.  20 tablet  0  . Prenatal Vit-Fe Fumarate-FA (PRENATAL MULTIVITAMIN) TABS Take 1 tablet by mouth daily.      . progesterone (PROMETRIUM) 200 MG capsule One capsule per vagina nightly  30 capsule  5    ROS Physical Exam   Blood  pressure 135/100, pulse 95, temperature 97.3 F (36.3 C), temperature source Oral, resp. rate 18, last menstrual period 03/22/2012.  Physical Exam  Constitutional: She is oriented to person, place, and time. She appears well-developed and well-nourished. No distress.  HENT:  Head: Normocephalic and atraumatic.  Neck: Normal range of motion. Neck supple.  Cardiovascular: Normal rate, regular rhythm and normal heart sounds.   No murmur heard. Respiratory: Effort normal and breath sounds normal. No respiratory distress. She has no wheezes. She has no rales.  GI: Soft. Bowel sounds are normal. She exhibits no distension and no mass. There is no tenderness. There is no rebound and no guarding.  Musculoskeletal: She exhibits no edema and no tenderness.  Neurological: She is alert and oriented to person, place, and time.  Skin: Skin is warm and dry.  Psychiatric:        Nervous    FHR: 140's by doppler  MAU Course  Procedures 1. EFM  Assessment and Plan  1. Incompetent Cervix:  Bed rest; Follow OB instructions. 2. EFM:  3. F/U with your OB as scheduled.  Claiborne Billings, Maureen Delatte 08/26/2012, 2:04 PM

## 2012-08-31 ENCOUNTER — Other Ambulatory Visit: Payer: Self-pay | Admitting: Obstetrics & Gynecology

## 2012-08-31 DIAGNOSIS — O3430 Maternal care for cervical incompetence, unspecified trimester: Secondary | ICD-10-CM

## 2012-09-01 ENCOUNTER — Encounter (HOSPITAL_COMMUNITY): Payer: Self-pay | Admitting: *Deleted

## 2012-09-01 ENCOUNTER — Ambulatory Visit (INDEPENDENT_AMBULATORY_CARE_PROVIDER_SITE_OTHER): Payer: Medicaid Other | Admitting: Obstetrics & Gynecology

## 2012-09-01 ENCOUNTER — Inpatient Hospital Stay (HOSPITAL_COMMUNITY)
Admission: AD | Admit: 2012-09-01 | Discharge: 2012-09-04 | DRG: 775 | Disposition: A | Discharge status: Home / Self Care | Payer: Medicaid Other | Source: Ambulatory Visit | Attending: Obstetrics & Gynecology | Admitting: Obstetrics & Gynecology

## 2012-09-01 ENCOUNTER — Ambulatory Visit (HOSPITAL_COMMUNITY)
Admission: RE | Admit: 2012-09-01 | Discharge: 2012-09-01 | Disposition: A | Discharge status: Home / Self Care | Payer: Medicaid Other | Source: Ambulatory Visit | Attending: Obstetrics & Gynecology | Admitting: Obstetrics & Gynecology

## 2012-09-01 VITALS — BP 153/103 | Temp 97.2°F | Wt 167.6 lb

## 2012-09-01 VITALS — BP 130/93 | HR 85 | Wt 168.5 lb

## 2012-09-01 DIAGNOSIS — O169 Unspecified maternal hypertension, unspecified trimester: Secondary | ICD-10-CM

## 2012-09-01 DIAGNOSIS — O411090 Infection of amniotic sac and membranes, unspecified, unspecified trimester, not applicable or unspecified: Secondary | ICD-10-CM | POA: Diagnosis present

## 2012-09-01 DIAGNOSIS — O09899 Supervision of other high risk pregnancies, unspecified trimester: Secondary | ICD-10-CM

## 2012-09-01 DIAGNOSIS — O10019 Pre-existing essential hypertension complicating pregnancy, unspecified trimester: Secondary | ICD-10-CM | POA: Insufficient documentation

## 2012-09-01 DIAGNOSIS — O0990 Supervision of high risk pregnancy, unspecified, unspecified trimester: Secondary | ICD-10-CM

## 2012-09-01 DIAGNOSIS — O09299 Supervision of pregnancy with other poor reproductive or obstetric history, unspecified trimester: Secondary | ICD-10-CM | POA: Insufficient documentation

## 2012-09-01 DIAGNOSIS — O4290 Premature rupture of membranes, unspecified as to length of time between rupture and onset of labor, unspecified weeks of gestation: Principal | ICD-10-CM | POA: Diagnosis present

## 2012-09-01 DIAGNOSIS — O3400 Maternal care for unspecified congenital malformation of uterus, unspecified trimester: Secondary | ICD-10-CM | POA: Insufficient documentation

## 2012-09-01 DIAGNOSIS — O42912 Preterm premature rupture of membranes, unspecified as to length of time between rupture and onset of labor, second trimester: Secondary | ICD-10-CM

## 2012-09-01 DIAGNOSIS — O3430 Maternal care for cervical incompetence, unspecified trimester: Secondary | ICD-10-CM

## 2012-09-01 DIAGNOSIS — O3440 Maternal care for other abnormalities of cervix, unspecified trimester: Secondary | ICD-10-CM | POA: Insufficient documentation

## 2012-09-01 DIAGNOSIS — O328XX0 Maternal care for other malpresentation of fetus, not applicable or unspecified: Secondary | ICD-10-CM | POA: Diagnosis present

## 2012-09-01 LAB — URINALYSIS, ROUTINE W REFLEX MICROSCOPIC
Bilirubin Urine: NEGATIVE
Glucose, UA: NEGATIVE mg/dL
Nitrite: NEGATIVE
Specific Gravity, Urine: >1.03 — ABNORMAL HIGH (ref 1.005–1.030)
pH: 6.5 (ref 5.0–8.0)

## 2012-09-01 LAB — CBC WITH DIFFERENTIAL/PLATELET
HCT: 36.2 % (ref 36.0–46.0)
Hemoglobin: 12.6 g/dL (ref 12.0–15.0)
Lymphocytes Relative: 11 % — ABNORMAL LOW (ref 12–46)
MCV: 85.8 fL (ref 78.0–100.0)
Monocytes Absolute: 1.2 10*3/uL — ABNORMAL HIGH (ref 0.1–1.0)
Monocytes Relative: 6 % (ref 3–12)
Neutro Abs: 16.8 10*3/uL — ABNORMAL HIGH (ref 1.7–7.7)
WBC: 20.3 10*3/uL — ABNORMAL HIGH (ref 4.0–10.5)

## 2012-09-01 LAB — WET PREP, GENITAL
Clue Cells Wet Prep HPF POC: NONE SEEN
Trich, Wet Prep: NONE SEEN
Yeast Wet Prep HPF POC: NONE SEEN

## 2012-09-01 LAB — GROUP B STREP BY PCR: Group B strep by PCR: NEGATIVE

## 2012-09-01 LAB — RPR: RPR Ser Ql: NONREACTIVE

## 2012-09-01 LAB — POCT URINALYSIS DIP (DEVICE)
Bilirubin Urine: NEGATIVE
Hgb urine dipstick: NEGATIVE
Ketones, ur: NEGATIVE mg/dL
pH: 7 (ref 5.0–8.0)

## 2012-09-01 LAB — URINE MICROSCOPIC-ADD ON

## 2012-09-01 LAB — AMNISURE RUPTURE OF MEMBRANE (ROM) NOT AT ARMC: Amnisure ROM: NEGATIVE

## 2012-09-01 MED ORDER — FENTANYL CITRATE 0.05 MG/ML IJ SOLN
100.0000 ug | INTRAMUSCULAR | Status: DC | PRN
  Administered 2012-09-01 – 2012-09-02 (×6): 100 ug via INTRAVENOUS
  Filled 2012-09-01 (×6): qty 2

## 2012-09-01 MED ORDER — BETAMETHASONE SOD PHOS & ACET 6 (3-3) MG/ML IJ SUSP
12.0000 mg | Freq: Once | INTRAMUSCULAR | Status: AC
  Administered 2012-09-02: 12 mg via INTRAMUSCULAR
  Filled 2012-09-01: qty 2

## 2012-09-01 MED ORDER — DIPHENHYDRAMINE HCL 50 MG/ML IJ SOLN: 12.5000 mg | Freq: Four times a day (QID) | INTRAMUSCULAR | Status: DC | PRN

## 2012-09-01 MED ORDER — LIDOCAINE HCL (PF) 1 % IJ SOLN: 30.0000 mL | INTRAMUSCULAR | Status: DC | PRN

## 2012-09-01 MED ORDER — OXYCODONE-ACETAMINOPHEN 5-325 MG PO TABS
1.0000 | ORAL_TABLET | ORAL | Status: DC | PRN
  Administered 2012-09-02: 1 via ORAL
  Administered 2012-09-02 (×2): 2 via ORAL
  Administered 2012-09-02: 1 via ORAL
  Filled 2012-09-01 (×2): qty 1
  Filled 2012-09-01 (×2): qty 2

## 2012-09-01 MED ORDER — TERBUTALINE SULFATE 1 MG/ML IJ SOLN
INTRAMUSCULAR | Status: AC
  Administered 2012-09-01: 1 mg
  Filled 2012-09-01: qty 1

## 2012-09-01 MED ORDER — LACTATED RINGERS IV SOLN
INTRAVENOUS | Status: DC
  Administered 2012-09-01: 75 mL/h via INTRAVENOUS
  Administered 2012-09-02: 11:00:00 via INTRAVENOUS

## 2012-09-01 MED ORDER — LABETALOL HCL 200 MG PO TABS
400.0000 mg | ORAL_TABLET | Freq: Two times a day (BID) | ORAL | Status: DC
  Administered 2012-09-01 – 2012-09-02 (×2): 400 mg via ORAL
  Filled 2012-09-01 (×5): qty 2

## 2012-09-01 MED ORDER — ACETAMINOPHEN 325 MG PO TABS
650.0000 mg | ORAL_TABLET | ORAL | Status: DC | PRN
  Administered 2012-09-02: 650 mg via ORAL
  Filled 2012-09-01: qty 2

## 2012-09-01 MED ORDER — ONDANSETRON HCL 4 MG/2ML IJ SOLN
4.0000 mg | Freq: Four times a day (QID) | INTRAMUSCULAR | Status: DC | PRN
  Administered 2012-09-02: 4 mg via INTRAVENOUS
  Filled 2012-09-01: qty 2

## 2012-09-01 MED ORDER — LACTATED RINGERS IV BOLUS (SEPSIS)
1000.0000 mL | Freq: Once | INTRAVENOUS | Status: AC
  Administered 2012-09-01: 1000 mL via INTRAVENOUS

## 2012-09-01 MED ORDER — CITRIC ACID-SODIUM CITRATE 334-500 MG/5ML PO SOLN: 30.0000 mL | ORAL | Status: DC | PRN

## 2012-09-01 MED ORDER — SODIUM CHLORIDE 0.9 % IV SOLN
250.0000 mg | Freq: Four times a day (QID) | INTRAVENOUS | Status: DC
  Administered 2012-09-01 – 2012-09-02 (×6): 250 mg via INTRAVENOUS
  Filled 2012-09-01 (×7): qty 250

## 2012-09-01 MED ORDER — PANTOPRAZOLE SODIUM 40 MG PO TBEC: 40.0000 mg | DELAYED_RELEASE_TABLET | Freq: Every day | ORAL | Status: DC

## 2012-09-01 MED ORDER — FENTANYL 10 MCG/ML IV SOLN
INTRAVENOUS | Status: DC
  Administered 2012-09-01: 17:00:00 via INTRAVENOUS
  Filled 2012-09-01: qty 50

## 2012-09-01 MED ORDER — LACTATED RINGERS IV SOLN: 500.0000 mL | INTRAVENOUS | Status: DC | PRN

## 2012-09-01 MED ORDER — SODIUM CHLORIDE 0.9 % IJ SOLN: 9.0000 mL | INTRAMUSCULAR | Status: DC | PRN

## 2012-09-01 MED ORDER — MAGNESIUM SULFATE 40 G IN LACTATED RINGERS - SIMPLE
2.0000 g/h | INTRAVENOUS | Status: DC
  Administered 2012-09-01: 2 g/h via INTRAVENOUS
  Administered 2012-09-01: 6 g via INTRAVENOUS
  Administered 2012-09-02: 2 g/h via INTRAVENOUS
  Filled 2012-09-01 (×2): qty 500

## 2012-09-01 MED ORDER — DIPHENHYDRAMINE HCL 12.5 MG/5ML PO ELIX
12.5000 mg | ORAL_SOLUTION | Freq: Four times a day (QID) | ORAL | Status: DC | PRN
  Filled 2012-09-01: qty 5

## 2012-09-01 MED ORDER — SODIUM CHLORIDE 0.9 % IV SOLN
2.0000 g | Freq: Four times a day (QID) | INTRAVENOUS | Status: DC
  Administered 2012-09-01 – 2012-09-02 (×6): 2 g via INTRAVENOUS
  Filled 2012-09-01 (×7): qty 2000

## 2012-09-01 MED ORDER — CITRIC ACID-SODIUM CITRATE 334-500 MG/5ML PO SOLN
30.0000 mL | Freq: Three times a day (TID) | ORAL | Status: DC | PRN
  Administered 2012-09-01: 30 mL via ORAL

## 2012-09-01 MED ORDER — HYDROXYZINE HCL 25 MG PO TABS
25.0000 mg | ORAL_TABLET | Freq: Three times a day (TID) | ORAL | Status: DC | PRN
  Administered 2012-09-02 (×2): 25 mg via ORAL
  Filled 2012-09-01 (×4): qty 1

## 2012-09-01 MED ORDER — FUROSEMIDE 10 MG/ML IJ SOLN: 20.0000 mg | Freq: Once | INTRAMUSCULAR | Status: DC

## 2012-09-01 MED ORDER — CALCIUM CARBONATE ANTACID 500 MG PO CHEW
1.0000 | CHEWABLE_TABLET | Freq: Every evening | ORAL | Status: DC | PRN
  Administered 2012-09-01: 200 mg via ORAL
  Filled 2012-09-01: qty 1

## 2012-09-01 MED ORDER — BETAMETHASONE SOD PHOS & ACET 6 (3-3) MG/ML IJ SUSP
12.0000 mg | Freq: Once | INTRAMUSCULAR | Status: AC
  Administered 2012-09-01: 12 mg via INTRAMUSCULAR
  Filled 2012-09-01: qty 2

## 2012-09-01 MED ORDER — CITRIC ACID-SODIUM CITRATE 334-500 MG/5ML PO SOLN
ORAL | Status: AC
  Administered 2012-09-01: 30 mL via ORAL
  Filled 2012-09-01: qty 15

## 2012-09-01 MED ORDER — TERBUTALINE SULFATE 1 MG/ML IJ SOLN: 0.2500 mg | Freq: Once | INTRAMUSCULAR | Status: DC

## 2012-09-01 MED ORDER — IBUPROFEN 600 MG PO TABS
600.0000 mg | ORAL_TABLET | Freq: Four times a day (QID) | ORAL | Status: DC | PRN
  Administered 2012-09-02: 600 mg via ORAL
  Filled 2012-09-01: qty 1

## 2012-09-01 MED ORDER — OXYTOCIN 40 UNITS IN LACTATED RINGERS INFUSION - SIMPLE MED
62.5000 mL/h | Freq: Once | INTRAVENOUS | Status: AC
  Administered 2012-09-02: 62.5 mL/h via INTRAVENOUS
  Filled 2012-09-01: qty 1000

## 2012-09-01 MED ORDER — OXYTOCIN BOLUS FROM INFUSION
500.0000 mL | Freq: Once | INTRAVENOUS | Status: DC
  Filled 2012-09-01: qty 500

## 2012-09-01 MED ORDER — MAGNESIUM SULFATE BOLUS VIA INFUSION
6.0000 g | Freq: Once | INTRAVENOUS | Status: DC
  Filled 2012-09-01: qty 500

## 2012-09-01 MED ORDER — ONDANSETRON HCL 4 MG/2ML IJ SOLN: 4.0000 mg | Freq: Four times a day (QID) | INTRAMUSCULAR | Status: DC | PRN

## 2012-09-01 MED ORDER — NALOXONE HCL 0.4 MG/ML IJ SOLN: 0.4000 mg | INTRAMUSCULAR | Status: DC | PRN

## 2012-09-01 MED ORDER — PRENATAL MULTIVITAMIN CH
1.0000 | ORAL_TABLET | Freq: Every day | ORAL | Status: DC
  Administered 2012-09-02: 1 via ORAL
  Filled 2012-09-01: qty 1

## 2012-09-01 NOTE — MAU Note (Signed)
Pt states she has been on bedrest and noticed leakage of fluid Tuesday night. Was evaluated in the clinic today and had ultrasound and is ruptured.

## 2012-09-01 NOTE — Progress Notes (Addendum)
Ms. [name] had an ultrasound appointment today.  Please see [facility]-OB/GYN report for details.  Comments The patient notes that she has had no bleeding but has abdominal cramping every 2 minutes and reports leakage of fluid from the vagina. Due to anhydramnios and patient review of systems concerning for labor and pPROM, evaluation by endovaginal ultrasound was deferred.  Discussion: I spoke to your patient by way of bedside consultation regarding the diagnosis of preterm premature rupture of membranes (pPROM) in general and, in this case, prior to achievement of viability.  We began by discussing the physiology behind ROM as a result of progressing weakening of the amnion and chorion.  Historically, pPROM is thought to be a pathologic weakening of these membranes, oftentimes, by virtue of a subclinical infection/colonization of the membranes that triggers inflammation ultimately resulting in weakening and rupture of membranes as evidence by leakage of fluid and reduction of amniotic fluid index.  I explained to her that between 24 and [redacted] weeks gestation, the routine management of pPROM consists of antenatal steroids to reduce neonatal morbidity/mortality and antibiotics given to increase latency--the time interval from ROM to delivery. She is currently near the limit of viability and would be  A candidate for the routine administration of latency antibiotics and steroids.   I explained to her that premature babies have a number of risks that are inversely proportional to gestational age, meaning that they are more common in earlier gestational ages, including that of respiratory distress syndrome (RDS), intraventricular hemorrhage (IVH), necrotizing enterocolitis (NEC), cerebral palsy (CP), developmental delay/behavioral disorders, blindness, deafness, and chronic lung disease.  Although I did not cite exact numbers to her, I recommend that neonatologist meet with patients to provide  precise risks to expect based upon our NICU's data. That being said of the pregnancies affected by pPROM prior to 24 weeks that are continued until at least [redacted] weeks gestation, nearly half of these babies have intact survival.   Impression Active singleton fetus with no apparent dysmorphic features on today's routine anatomic re-examination with anhydramnios.  Cervical insufficiency Likely pPROM with PTL symptoms Normal interval growth with EFW at 41st percentile   Recommendations 1. I spoke to attending faculty managing labor and delivery regarding this concerning examination.   2. If clinical correlation is confirmed, I recommend initiating a course of antenatal corticosteroids, latency antibiotics, and magnesium sulfate for neuroprophylaxis. 3. The patient was sent to MAU for further evaluation and admission.   4. If active labor is diagnosed, cerclage removal is recommended. 5. I recommend NICU consultation.  Rogelia Boga, MD, MS, FACOG Assistant Professor Section of Maternal-Fetal Medicine [facility]

## 2012-09-01 NOTE — Progress Notes (Signed)
Nurses note reviewed. Exam showed intact cerclage and thin grayish d/c, negative nitrazine. Wet prep done , fern test negative. Has [place] today in MFM, will f/u on wet prep. No evidence of ROM Heartburn, some nausea,vomited her labetalol today. Change to Protonix

## 2012-09-01 NOTE — H&P (Signed)
[name] is a 36 y.o. female presenting for PPROM and PTL.   Maternal Medical History:  Reason for admission: Reason for admission: rupture of membranes and contractions.  Reason for Admission:   nauseaContractions: Onset was more than 2 days ago.   Frequency: irregular.   Perceived severity is moderate.    Fetal activity: Perceived fetal activity is normal.   Last perceived fetal movement was within the past hour.    Prenatal complications: History of 21 week delivery with cervical incompetence last pregnancy. Cerclage in place this pregnancy. Seen last month with decreased cervical length.   Prenatal Complications - Diabetes: none.    Pt is a 36 y.o. [id number] at [redacted]w[redacted]d with leaking fluid for 4 days. Seen in clinic this AM. Cerclage in place, cervix closed, thin grayish fluid in vagina, nitrazine negative. Wet prep and GBS were done and is pending. She then went [facility] where she was found to have no amniotic fluid.  She states she started having contractions q 2 minutes between appointments getting stronger with time. At the time of this exam they have spaced out to about every 5 minutes. No bleeding. Baby is moving. She has a history of 21 week PTL/PPROM and incompetent cervix. She has been on vaginal progesterone and has a cerclage in place.   She was seen on 9/27 for contractions and cervical length was <0.5 cm at that time. Patient opted to maintain cerclage and was discharged home. On 10/4 she was again see in [facility] for decreased fetal movement but was discharged home after fetal heart tones were confirmed.   OB History    Grav Para Term Preterm Abortions TAB SAB Ect Mult Living   7 3 2 1 3 2 1  0 0 2     Past Medical History  Diagnosis Date  . Anxiety   . GERD (gastroesophageal reflux disease)   . STD (female)     HX of STD's  . Abnormal Pap smear 1998    had colpo  . HCV infection     HX of Hep C  . Opiate dependence     stopped  methadone 01/2011  . Bicornuate uterus   . Depression   . Hypertension 2010  . Bicornate uterus   . Headache   . Chlamydia 2000  . Asthma     childhood asthma  . PONV (postoperative nausea and vomiting)   . IUP (intrauterine pregnancy), incidental 13 weeks   Past Surgical History  Procedure Date  . Therapeutic abortion   . Therapeutic abortion   . Tonsillectomy   . Dilation and curettage of uterus     SAB  . Finger surgery 2003    reattachment of right forefinger  . Cervical cerclage 09/01/2011    Procedure: CERCLAGE CERVICAL;  Surgeon: Reva Bores, MD;  Location: [facility];  Service: Gynecology;  Laterality: N/A;  . Cervical cerclage 06/28/2012    Procedure: CERCLAGE CERVICAL;  Surgeon: Reva Bores, MD;  Location: [facility];  Service: Gynecology;  Laterality: N/A;   Family History: family history includes Diabetes in her paternal grandfather; Hypertension in her mother; and Thyroid disease in her maternal grandmother.  There is no history of Anesthesia problems and Other. Social History:  reports that she has been smoking Cigarettes.  She has a 10 pack-year smoking history. She quit smokeless tobacco use about 3 months ago. She reports that she does not drink alcohol or use illicit drugs.   Prenatal  Transfer Tool  Maternal Diabetes: No Genetic Screening: Declined Maternal Ultrasounds/Referrals: Abnormal:  Findings:   Other: Anhydramnios 09/01/12, shortened cervix. Fetal Ultrasounds or other Referrals:  None Maternal Substance Abuse:  No Significant Maternal Medications:  Meds include: Progesterone Significant Maternal Lab Results:  None Other Comments:  None  Review of Systems  Constitutional: Negative for fever and chills.  Eyes: Negative for blurred vision and double vision.  Respiratory: Negative for shortness of breath.   Cardiovascular: Negative for chest pain.  Gastrointestinal: Positive for abdominal pain. Negative for nausea and vomiting.  Genitourinary: Negative for  dysuria, urgency and frequency.  Neurological: Negative for dizziness and headaches.    Dilation: Closed Exam by:: Dr. Thad Ranger Blood pressure 130/89, pulse 83, temperature 97.9 F (36.6 C), temperature source Oral, resp. rate 18, height 5\' 2"  (1.575 m), weight 75.751 kg (167 lb), last menstrual period 03/22/2012. Maternal Exam:  Uterine Assessment: Contraction strength is moderate.  Contraction frequency is irregular.   Abdomen: Patient reports generalized tenderness.  Fetal presentation: vertex  Introitus: Normal vulva. Normal vagina.  Ferning test: negative.  Nitrazine test: not done. Amniotic fluid character: clear.  Pelvis: adequate for delivery.   Cervix: Cervix evaluated by sterile speculum exam.   Cerclage in place. Cervix externally closed. Small amount of fluid in vaginal vault.  Fetal Exam Fetal Monitor Review: Mode: ultrasound.   Baseline rate: 145.  Variability: moderate (6-25 bpm).   Pattern: no accelerations and variable decelerations.       Physical Exam  GI: There is generalized tenderness.    Prenatal labs: ABO, Rh:   Antibody:   Rubella: 86.6 (07/10 1022) RPR: NON REAC (07/10 1022)  HBsAg: NEGATIVE (07/10 1022)  HIV: NON REACTIVE (07/10 1022)  GBS:     Assessment/Plan: 36 y.o. [id number] at [redacted]w[redacted]d with likely PPROM and PTL. 1.   Confirm PPROM with exam/amnisure 2.  Admit for antenatal corticosteriods.  Magnesium, latency antibiotics. Monitor for signs of chorio. 3.  If patient progresses to active labor, will need to remove cerclage.  4.  If contractions slow down, will consider transfer to facility with NICU capacity.  Napoleon Form, MD    Napoleon Form 09/01/2012, 1:18 PM

## 2012-09-01 NOTE — Progress Notes (Signed)
Attending Note (late entry)  Called to evaluate patient with increased pressure and pain; she is a [id number] at [redacted]w[redacted]d, PPROM with anhydramnios, footling breech presentation, cerclage in place. She denies any LOF, bleeding. Good FM.  Blood pressure 119/76, pulse 75, temperature 97.9 F (36.6 C), temperature source Oral, resp. rate 18, height 5\' 2"  (1.575 m), weight 75.751 kg (167 lb), last menstrual period 03/22/2012. Abdomen: soft, NT Cervix closed/50/-2/posterior, cerclage palpated, not under tension  Patient is currently on magnesium sulfate for tocolysis and neuroprotection, contracting very frequently.  Will give a dose of terbutaline, and continue to observe. First dose of betamethasone received at 1215, will repeat second dose in 12 hours. Continue latency antibiotics, Fentanyl PCA for pain, and close observation for now.  If preterm labor progresses, or any signs of chorioamnionitis/maternal-fetal distress, will remove cerclage and hope for SVD.    Jaynie Collins, MD, FACOG Attending Obstetrician & Gynecologist Faculty Practice, [facility]

## 2012-09-01 NOTE — Patient Instructions (Signed)

## 2012-09-01 NOTE — Progress Notes (Signed)
Pulse: 102 Has been noticing a watery discharge since Monday. Feels like its either her water or urine.  She says she feels the baby moving everyday but not quite as much as before. Would like to sign BTL consent today. Took BP Med this morning but vomited it back up. Tried to take it again, but was not able to.

## 2012-09-02 ENCOUNTER — Encounter (HOSPITAL_COMMUNITY): Payer: Self-pay | Admitting: *Deleted

## 2012-09-02 DIAGNOSIS — O3430 Maternal care for cervical incompetence, unspecified trimester: Secondary | ICD-10-CM

## 2012-09-02 DIAGNOSIS — O4290 Premature rupture of membranes, unspecified as to length of time between rupture and onset of labor, unspecified weeks of gestation: Secondary | ICD-10-CM

## 2012-09-02 DIAGNOSIS — O411090 Infection of amniotic sac and membranes, unspecified, unspecified trimester, not applicable or unspecified: Secondary | ICD-10-CM

## 2012-09-02 LAB — MAGNESIUM: Magnesium: 4.7 mg/dL — ABNORMAL HIGH (ref 1.5–2.5)

## 2012-09-02 LAB — GC/CHLAMYDIA PROBE AMP, GENITAL: Chlamydia, DNA Probe: NEGATIVE

## 2012-09-02 MED ORDER — KETOROLAC TROMETHAMINE 30 MG/ML IJ SOLN
30.0000 mg | Freq: Once | INTRAMUSCULAR | Status: AC
  Administered 2012-09-02: 30 mg via INTRAVENOUS
  Filled 2012-09-02: qty 1

## 2012-09-02 MED ORDER — MISOPROSTOL 200 MCG PO TABS
400.0000 ug | ORAL_TABLET | ORAL | Status: DC
  Administered 2012-09-02: 400 ug via VAGINAL
  Filled 2012-09-02: qty 2

## 2012-09-02 MED ORDER — OXYCODONE-ACETAMINOPHEN 5-325 MG PO TABS
1.0000 | ORAL_TABLET | ORAL | Status: DC | PRN
  Administered 2012-09-02 – 2012-09-04 (×8): 2 via ORAL
  Filled 2012-09-02 (×9): qty 2

## 2012-09-02 MED ORDER — ONDANSETRON HCL 4 MG PO TABS: 4.0000 mg | ORAL_TABLET | ORAL | Status: DC | PRN

## 2012-09-02 MED ORDER — BENZOCAINE-MENTHOL 20-0.5 % EX AERO: 1.0000 "application " | INHALATION_SPRAY | CUTANEOUS | Status: DC | PRN

## 2012-09-02 MED ORDER — LANOLIN HYDROUS EX OINT: TOPICAL_OINTMENT | CUTANEOUS | Status: DC | PRN

## 2012-09-02 MED ORDER — SENNOSIDES-DOCUSATE SODIUM 8.6-50 MG PO TABS
2.0000 | ORAL_TABLET | Freq: Every day | ORAL | Status: DC
  Administered 2012-09-02 – 2012-09-03 (×2): 2 via ORAL

## 2012-09-02 MED ORDER — PROMETHAZINE HCL 25 MG/ML IJ SOLN
12.5000 mg | Freq: Four times a day (QID) | INTRAMUSCULAR | Status: DC | PRN
  Administered 2012-09-02: 12.5 mg via INTRAVENOUS
  Filled 2012-09-02: qty 1

## 2012-09-02 MED ORDER — PRENATAL MULTIVITAMIN CH
1.0000 | ORAL_TABLET | Freq: Every day | ORAL | Status: DC
  Administered 2012-09-03: 1 via ORAL
  Filled 2012-09-02: qty 1

## 2012-09-02 MED ORDER — ZOLPIDEM TARTRATE 5 MG PO TABS
5.0000 mg | ORAL_TABLET | Freq: Every evening | ORAL | Status: DC | PRN
  Administered 2012-09-02 – 2012-09-03 (×2): 5 mg via ORAL
  Filled 2012-09-02 (×2): qty 1

## 2012-09-02 MED ORDER — TETANUS-DIPHTH-ACELL PERTUSSIS 5-2.5-18.5 LF-MCG/0.5 IM SUSP: 0.5000 mL | Freq: Once | INTRAMUSCULAR | Status: DC

## 2012-09-02 MED ORDER — ONDANSETRON HCL 4 MG/2ML IJ SOLN: 4.0000 mg | INTRAMUSCULAR | Status: DC | PRN

## 2012-09-02 MED ORDER — DIBUCAINE 1 % RE OINT: 1.0000 "application " | TOPICAL_OINTMENT | RECTAL | Status: DC | PRN

## 2012-09-02 MED ORDER — IBUPROFEN 600 MG PO TABS
600.0000 mg | ORAL_TABLET | Freq: Four times a day (QID) | ORAL | Status: DC
  Administered 2012-09-03 – 2012-09-04 (×5): 600 mg via ORAL
  Filled 2012-09-02 (×5): qty 1

## 2012-09-02 MED ORDER — WITCH HAZEL-GLYCERIN EX PADS: 1.0000 "application " | MEDICATED_PAD | CUTANEOUS | Status: DC | PRN

## 2012-09-02 MED ORDER — SIMETHICONE 80 MG PO CHEW: 80.0000 mg | CHEWABLE_TABLET | ORAL | Status: DC | PRN

## 2012-09-02 MED ORDER — DIPHENHYDRAMINE HCL 25 MG PO CAPS
25.0000 mg | ORAL_CAPSULE | Freq: Four times a day (QID) | ORAL | Status: DC | PRN
  Administered 2012-09-03: 25 mg via ORAL
  Filled 2012-09-02: qty 1

## 2012-09-02 NOTE — Progress Notes (Signed)
1115- dr. Shawnie Pons in to do spec exam. meconeium stained fluid noted. Cerclage intact. States she will consult with MFM as to the plan of care for this pt.

## 2012-09-02 NOTE — Progress Notes (Signed)
Patient ID: [name], female   DOB: 22-Mar-1976, 36 y.o.   MRN: [id number]   S:  Pt states her contractions have decreased significantly in frequency, intensity and duration. They no longer wake her up. She has one an hour roughly, sometime two. She is leaking brownish/yellow thick liquid. Baby is moving.  OCeasar Mons Vitals:   09/02/12 0503 09/02/12 0602 09/02/12 0702 09/02/12 0754  BP:  104/55 120/75 116/66  Pulse:  71 78 78  Temp:  98.1 F (36.7 C)  98.3 F (36.8 C)  TempSrc:  Oral  Oral  Resp: 14 20 18 20   Height:      Weight:      SpO2:       Exam:   Gen:  No distress CV:  RRR, no murmur Lungs:  CTAB Abdomen:  Non-tender Extrem:  No edema  NST at midnight:  130, moderate variability, not quite qualifying for 10x10 acceleration but no decels.  Results for orders placed during the hospital encounter of 09/01/12 (from the past 24 hour(s))  CBC WITH DIFFERENTIAL     Status: Abnormal   Collection Time   09/01/12 11:40 AM      Component Value Range   WBC 20.3 (*) 4.0 - 10.5 K/uL   RBC 4.22  3.87 - 5.11 MIL/uL   Hemoglobin 12.6  12.0 - 15.0 g/dL   HCT 14.7  82.9 - 56.2 %   MCV 85.8  78.0 - 100.0 fL   MCH 29.9  26.0 - 34.0 pg   MCHC 34.8  30.0 - 36.0 g/dL   RDW 13.0  86.5 - 78.4 %   Platelets 254  150 - 400 K/uL   Neutrophils Relative 83 (*) 43 - 77 %   Neutro Abs 16.8 (*) 1.7 - 7.7 K/uL   Lymphocytes Relative 11 (*) 12 - 46 %   Lymphs Abs 2.2  0.7 - 4.0 K/uL   Monocytes Relative 6  3 - 12 %   Monocytes Absolute 1.2 (*) 0.1 - 1.0 K/uL   Eosinophils Relative 0  0 - 5 %   Eosinophils Absolute 0.1  0.0 - 0.7 K/uL   Basophils Relative 0  0 - 1 %   Basophils Absolute 0.0  0.0 - 0.1 K/uL  RPR     Status: Normal   Collection Time   09/01/12 12:30 PM      Component Value Range   RPR NON REACTIVE  NON REACTIVE  WET PREP, GENITAL     Status: Abnormal   Collection Time   09/01/12 12:50 PM      Component Value Range   Yeast Wet Prep HPF POC NONE SEEN  NONE SEEN   Trich, Wet Prep NONE SEEN  NONE SEEN   Clue Cells Wet Prep HPF POC NONE SEEN  NONE SEEN   WBC, Wet Prep HPF POC MANY (*) NONE SEEN  GROUP B STREP BY PCR     Status: Normal   Collection Time   09/01/12 12:50 PM      Component Value Range   Group B strep by PCR NEGATIVE  NEGATIVE  AMNISURE RUPTURE OF MEMBRANE (ROM)     Status: Normal   Collection Time   09/01/12  1:35 PM      Component Value Range   Amnisure ROM NEGATIVE    URINALYSIS, ROUTINE W REFLEX MICROSCOPIC     Status: Abnormal   Collection Time   09/01/12  3:20 PM      Component Value Range  Color, Urine YELLOW  YELLOW   APPearance CLEAR  CLEAR   Specific Gravity, Urine >1.030 (*) 1.005 - 1.030   pH 6.5  5.0 - 8.0   Glucose, UA NEGATIVE  NEGATIVE mg/dL   Hgb urine dipstick LARGE (*) NEGATIVE   Bilirubin Urine NEGATIVE  NEGATIVE   Ketones, ur 15 (*) NEGATIVE mg/dL   Protein, ur NEGATIVE  NEGATIVE mg/dL   Urobilinogen, UA 0.2  0.0 - 1.0 mg/dL   Nitrite NEGATIVE  NEGATIVE   Leukocytes, UA LARGE (*) NEGATIVE  URINE MICROSCOPIC-ADD ON     Status: Abnormal   Collection Time   09/01/12  3:20 PM      Component Value Range   Squamous Epithelial / LPF RARE  RARE   WBC, UA TOO NUMEROUS TO COUNT  <3 WBC/hpf   RBC / HPF 11-20  <3 RBC/hpf   Bacteria, UA MANY (*) RARE  MAGNESIUM     Status: Abnormal   Collection Time   09/01/12 11:55 PM      Component Value Range   Magnesium 4.7 (*) 1.5 - 2.5 mg/dL   A/P 36 y.o. [id number] at [redacted]w[redacted]d with PPROM, anhydramnios and preterm labor with cerclage in place. - Continue mag 48 hours - Continue latency antibiotics - s/p 2 doses betamethasone - monitor for signs of infection or fetal distress - NST is reassuring, non-tender uterus, afebrile - NICU consult pending - Contractions have almost stopped.  - Breech presentation - discuss c-section vs vaginal delivery and cerclage removal if delivery imminent or necessary.  Napoleon Form, MD

## 2012-09-02 NOTE — Progress Notes (Signed)
Patient ID: [name], female   DOB: 06/09/1976, 36 y.o.   MRN: [id number] Pt. Requested pain med.  Given.  D/C magnesium. With insertion of speculum, purulent fluid noted.  Cerclage removed without difficulty. Will start cytotec.  Continue abx.

## 2012-09-02 NOTE — Progress Notes (Signed)
[name] is a 36 y.o. [id number] at [redacted]w[redacted]d by LMP admitted for PROM  Subjective: Pt. Now passing MSF per vagina.  Worrisome for infection.  Discussed with MFM- possible transfer.  [facility] Lan does not want to take her.  Follow-up with pt, she does not want aggressive resuscitative efforts.    Objective: BP 106/67  Pulse 76  Temp 98 F (36.7 C) (Oral)  Resp 20  Ht 5\' 2"  (1.575 m)  Wt 167 lb (75.751 kg)  BMI 30.54 kg/m2  SpO2 97%  LMP 03/22/2012 I/O last 3 completed shifts: In: 3950 [P.O.:1500; I.V.:1950; IV Piggyback:500] Out: 2925 [Urine:2275; Emesis/NG output:650] Total I/O In: 1263.3 [P.O.:650; I.V.:563.3; IV Piggyback:50] Out: 400 [Urine:400]  FHT:  FHR: 150 bpm, variability: minimal ,  accelerations:  Abscent,  decelerations:  Absent UC:   none SVE:   Dilation: Closed (cerclage stitch intact) Exam by:: Dr. Macon Large  Labs: Lab Results  Component Value Date   WBC 20.3* 09/01/2012   HGB 12.6 09/01/2012   HCT 36.2 09/01/2012   MCV 85.8 09/01/2012   PLT 254 09/01/2012    Assessment / Plan: PPROM-pre-viable Will cut stitch, turn off Magnesium.  Continue Abx.  Cytotec to hasten delivery.  Loreen Bankson S 09/02/2012, 12:22 PM

## 2012-09-02 NOTE — Progress Notes (Signed)
Patient ID: [name], female   DOB: [birth year], 36 y.o.   MRN: [id number]  Feeling more pain with contractions.  Abdomen soft and nontender between  Cervix deferred  Will add Phenergan to potentiate Fentanyl.

## 2012-09-02 NOTE — Progress Notes (Signed)
1250 pt states she does not want her baby resusitated.

## 2012-09-02 NOTE — Progress Notes (Signed)
1300. Magnesium sulfate dc'd as ordered. Dr. Shawnie Pons in to take out cerclage.

## 2012-09-02 NOTE — Progress Notes (Signed)
1230- dr. Mikle Bosworth updated on pt's situation and desire to have no resusitation.

## 2012-09-02 NOTE — Progress Notes (Signed)
[name] is a 36 y.o. [id number] at [redacted]w[redacted]d admitted for PPROM with hx of cervical insufficiency, preterm labor.  Subjective: Patient very worried about meconium affecting the baby.  She has been wiping green liquid after urinating.  Patient has not been feeling any recent contractions.    Objective: BP 118/76  Pulse 75  Temp 98.3 F (36.8 C) (Oral)  Resp 20  Ht 5\' 2"  (1.575 m)  Wt 75.751 kg (167 lb)  BMI 30.54 kg/m2  SpO2 97%  LMP 03/22/2012 I/O last 3 completed shifts: In: 3950 [P.O.:1500; I.V.:1950; IV Piggyback:500] Out: 2925 [Urine:2275; Emesis/NG output:650] Total I/O In: 290 [P.O.:90; I.V.:200] Out: 400 [Urine:400]  FHT:  FHR: 130 bpm, variability: moderate,  accelerations:  Present,  decelerations:  Absent UC:   none SVE:   Dilation: Closed (cerclage stitch intact) Exam by:: Dr. Macon Large  Labs: Lab Results  Component Value Date   WBC 20.3* 09/01/2012   HGB 12.6 09/01/2012   HCT 36.2 09/01/2012   MCV 85.8 09/01/2012   PLT 254 09/01/2012    Assessment / Plan: 36 y.o. [id number] at [redacted]w[redacted]d with PPROM, anhydramnios and preterm labor with cerclage in place.   - Not currently contracting - Continue mag - Continue Ampicillin - s/p 2 doses betamethasone  - monitor for signs of infection or fetal distress - NST is reassuring, non-tender uterus, afebrile  - NICU consulted, aware patient is in need of a consult for prognosis of baby. - Breech presentation with meconium- discuss c-section vs vaginal delivery and cerclage removal if delivery imminent or necessary. - Will discuss diet with attending, c-section possible for patient in near future.   Sonia Side 09/02/2012, 9:45 AM  I have seen and examined this patient and I agree with the above. Dr Shawnie Pons to see pt; not planning C/S currently. Cam Hai 10:39 PM 09/02/2012

## 2012-09-03 LAB — CBC
Hemoglobin: 7.7 g/dL — ABNORMAL LOW (ref 12.0–15.0)
MCH: 29.7 pg (ref 26.0–34.0)
Platelets: 210 10*3/uL (ref 150–400)
RBC: 2.59 MIL/uL — ABNORMAL LOW (ref 3.87–5.11)
WBC: 21.6 10*3/uL — ABNORMAL HIGH (ref 4.0–10.5)

## 2012-09-03 MED ORDER — HYDROMORPHONE HCL 2 MG PO TABS
2.0000 mg | ORAL_TABLET | ORAL | Status: DC | PRN
  Administered 2012-09-03 (×2): 2 mg via ORAL
  Filled 2012-09-03 (×2): qty 1

## 2012-09-03 MED ORDER — MISOPROSTOL 200 MCG PO TABS
400.0000 ug | ORAL_TABLET | Freq: Once | ORAL | Status: AC
  Administered 2012-09-03: 400 ug via ORAL
  Filled 2012-09-03: qty 2

## 2012-09-03 MED ORDER — HYDROMORPHONE HCL PF 1 MG/ML IJ SOLN
1.0000 mg | Freq: Once | INTRAMUSCULAR | Status: AC
  Administered 2012-09-03: 1 mg via INTRAVENOUS
  Filled 2012-09-03: qty 1

## 2012-09-03 NOTE — Clinical Social Work Note (Signed)
Clinical Social Work Department BRIEF PSYCHOSOCIAL ASSESSMENT 09/03/2012  Patient:  [name], [name]     Account Number:  [id number]     Admit date:  09/01/2012  Clinical Social Worker:  Doreene Nest  Date/Time:  09/03/2012 11:30 AM  Referred by:  Physician  Date Referred:  09/03/2012 Referred for  Other - See comment   Other Referral:   fetal demise   Interview type:  Patient Other interview type:    PSYCHOSOCIAL DATA Living Status:  SIGNIFICANT OTHER Admitted from facility:   Level of care:   Primary support name:  Keenan Bachelor Primary support relationship to patient:  PARTNER Degree of support available:   supportive    CURRENT CONCERNS Current Concerns  Financial Resources  Other - See comment   Other Concerns:   resources for cremation and counseling services    SOCIAL WORK ASSESSMENT / PLAN CSW spoke with Pt briefly.  Pt reported having taken some medication to increase contractions and was unable to speak with CSW at that time.  CSW left resources in the room if pt wanted to look through them before CSW could come and speak with her about resources.  CSW will return and discuss in detail resources available to pt.   Assessment/plan status:  Psychosocial Support/Ongoing Assessment of Needs Other assessment/ plan:   Information/referral to community resources:   cremation resources  counseling services    PATIENT'S/FAMILY'S RESPONSE TO PLAN OF CARE: Pt was in pain and was unable to speak with CSW at length at this time.  Pt voiced she is thankful for resources and is glad CSW can return to discuss when pt is in less physical pain.

## 2012-09-03 NOTE — Progress Notes (Signed)
Paged at 0645 and arrived in unit at 97. [name] requested to speak to a chaplain concerning her recent loss of her child. This is the second child in a year she has lost to fetal death. The anniversary of the death of the first child lost to fetal death (a girl) is 20[birth year]/09/21. This child (a boy) is named [name], [name]. Both mother and father have decided not to have any other children. [name] has two teen age children by a former relationship, and her significant other has one child by a previous relationship. The decision to "have her tubes tied" comes with some grief, as [name] wished to have a child with her significant other, but she feels after two children lost and the real possibility of the loss of others in the future, the decision is right. The couple considered "having her tubes tied" last year.  Of immediate concern is the cremation of this child [name]). I spoke with nursing staff for a request for a social worker to speak to the couple about this matter. [name] says they are not financially capable of funeral and burial expenses. Social Workers assisted in Hydrographic surveyor for the child born a year ago.  Spiritually [name] has a supportive faith community, and a strong faith. She is [name] in both name and belief. She was encouraged to lean on those that will provide her spiritual support while she leans on others to provide medical and psychological support. We spoke of this grief being different and similar to the grief of the last year. Since there will be places in her grief that are different than the grief of the last year, [name] was encouraged to let this new grief to run its course in its own way, and to accept the care of others during its duration.  Prayer and grief counsel provided. Chaplains should be paged if and when [name] needs spiritual comfort and care.  Unit nurses greatly assisted the chaplain in providing holistic care to this patient.  Benjie Karvonen.  Drevion Offord, D.Min. Chaplain

## 2012-09-03 NOTE — Progress Notes (Signed)
Called to room by patient.  Large amount of bright red blood on pad and moderate sized clots in toilet.  MD on unit, notified and examined patient.  Will continue to monitor.  Osvaldo Angst, RN-----------

## 2012-09-03 NOTE — Plan of Care (Signed)
Problem: Phase I Progression Outcomes Goal: Pain controlled with appropriate interventions Outcome: Completed/Met Date Met:  09/03/12 Gets pain control with po Percocet but it does not last long at times. Goal: Other Phase I Outcomes/Goals Outcome: Completed/Met Date Met:  09/03/12 Bleeding from this morning has slowed a lot and is now small in amt.There are no clots present.

## 2012-09-03 NOTE — Progress Notes (Signed)
Post Partum Day 1 Subjective: up ad lib and passing large dark clots.  Examined and several dark clots removed from cervix and lower uterine segment.  The placenta was inspcted by the delivering Cnm and reported as intact at delivery.  Objective: Blood pressure 116/71, pulse 72, temperature 98.3 F (36.8 C), temperature source Oral, resp. rate 18, height 5\' 2"  (1.575 m), weight 75.751 kg (167 lb), last menstrual period 03/22/2012, SpO2 96.00%, unknown if currently breastfeeding.  Physical Exam:  General: alert, mild distress and anxious Lochia: several large clots est volume 250cc removed from cervix by me with ring forceps and vigorous bimanual.  Another 250 cc noted in pads Uterine Fundus: firm 12 weeks size Incision: n/a DVT Evaluation: Negative Homan's sign.   Basename 09/01/12 1140  HGB 12.6  HCT 36.2    Assessment/Plan: Postpartum excess bleeding secondary tor etained clots.  No poc's suspected Plan:  Keep a few more hours Oral cytotec 400 mcg Oral analgesics D/c later today Tubal ligation in near future after postpartum visit in 2 weeks to schedule.   LOS: 2 days   Amadi Yoshino V 09/03/2012, 9:00 AM

## 2012-09-03 NOTE — Plan of Care (Signed)
Problem: Consults Goal: Postpartum Patient Education (See Patient Education module for education specifics.)  Outcome: Progressing Breast care discussed Pain relief discussed Birth control discussed  Problem: Phase II Progression Outcomes Goal: Other Phase II Outcomes/Goals Outcome: Completed/Met Date Met:  09/03/12 Patient wanted to see the Chaplain and he came and talked with her on some spiritual and anxiety techniques.The Social worker helped her find some resources for cremation of her baby.  Problem: Discharge Progression Outcomes Goal: Barriers To Progression Addressed/Resolved Outcome: Progressing Patient had heavier  Bleeding this AM and was given Cytotec which at this point is working.Her bleeding is within normal limits Goal: Complications resolved/controlled Outcome: Completed/Met Date Met:  09/03/12 Vaginal bleeding is within normal limits

## 2012-09-03 NOTE — Plan of Care (Signed)
Problem: Phase I Progression Outcomes Goal: Initial discharge plan identified Outcome: Progressing Comfort care Pain control Understands self care Knows when to call MD Goal: Other Phase I Outcomes/Goals Outcome: Progressing Continue comfort care  Problem: Phase II Progression Outcomes Goal: Progress activity as tolerated unless otherwise ordered Outcome: Completed/Met Date Met:  09/03/12 In the hall occasionally walking

## 2012-09-03 NOTE — Progress Notes (Signed)
CSW returned to speak with pt about cremation resources.  Pt plans to call a cremation service that can offer financial assistance.  Pt also was instructed to call medicaid if she felt she needed any further resources.  CSW also provided pt with a list of counseling services if needed.  Please reconsult if further concerns arise.    [phone]

## 2012-09-04 MED ORDER — DOCUSATE SODIUM 100 MG PO CAPS: 100.0000 mg | ORAL_CAPSULE | Freq: Two times a day (BID) | ORAL | Status: DC | PRN

## 2012-09-04 MED ORDER — IBUPROFEN 600 MG PO TABS: 600.0000 mg | ORAL_TABLET | Freq: Four times a day (QID) | ORAL | Status: DC

## 2012-09-04 MED ORDER — FERROUS SULFATE 325 (65 FE) MG PO TABS: 325.0000 mg | ORAL_TABLET | Freq: Two times a day (BID) | ORAL | Status: DC

## 2012-09-04 MED ORDER — OXYCODONE-ACETAMINOPHEN 5-325 MG PO TABS: 1.0000 | ORAL_TABLET | ORAL | Status: DC | PRN

## 2012-09-04 NOTE — Discharge Summary (Signed)
Obstetric Discharge Summary Reason for Admission: PPROM with cerclage in place at 23 weeks Prenatal Procedures: none Intrapartum Procedures: removal of cerclage and cytotec induction of labor Postpartum Procedures: none Complications-Operative and Postpartum: none Hemoglobin  Date Value Range Status  09/03/2012 7.7* 12.0 - 15.0 g/dL Final     HCT  Date Value Range Status  09/03/2012 22.6* 36.0 - 46.0 % Final  06/15/2011 42   Final     performed at [facility]    Physical Exam:  General: alert, cooperative and no distress Lochia: appropriate Uterine Fundus: firm and non-tender Incision: n/a DVT Evaluation: No evidence of DVT seen on physical exam. Negative Homan's sign. No cords or calf tenderness. No significant calf/ankle edema.  Discharge Diagnoses: PPROM with chorio at 23 weeks  Discharge Information: Date: 09/04/2012 Activity: pelvic rest Diet: routine Medications: Ibuprofen, Colace, Iron and Percocet Condition: stable Instructions: refer to practice specific booklet Discharge to: home Follow-up Information    Follow up with [facility]. (An appointment will be made for you in our clinic)          Newborn Data: Live born female  Birth Weight: 1 lb 1.1 oz (485 g) APGAR: 1, 1  Neonatal demise.  [name] 09/04/2012, 6:45 AM

## 2012-09-04 NOTE — Progress Notes (Signed)
Instructions given to patient regarding activity, breast care, follow up appointments and medications.  Specifically, education regarding percocet and monitoring acetaminophen.  Instructed not to take other acetaminophen/tylenol products while taking this mediation.  Prescription given to patient.  Patient left unit accompanied by staff with personal belongings in stable condition.  Osvaldo Angst, RN-----

## 2012-09-06 ENCOUNTER — Ambulatory Visit (INDEPENDENT_AMBULATORY_CARE_PROVIDER_SITE_OTHER): Payer: Medicaid Other | Admitting: Medical

## 2012-09-06 VITALS — BP 146/102 | HR 102 | Resp 12

## 2012-09-06 DIAGNOSIS — I10 Essential (primary) hypertension: Secondary | ICD-10-CM

## 2012-09-06 MED ORDER — IBUPROFEN 800 MG PO TABS: 800.0000 mg | ORAL_TABLET | Freq: Two times a day (BID) | ORAL | Status: DC | PRN

## 2012-09-06 MED ORDER — LABETALOL HCL 200 MG PO TABS: 600.0000 mg | ORAL_TABLET | Freq: Two times a day (BID) | ORAL | Status: DC

## 2012-09-06 MED ORDER — OXYCODONE-ACETAMINOPHEN 5-325 MG PO TABS: 1.0000 | ORAL_TABLET | Freq: Four times a day (QID) | ORAL | Status: DC | PRN

## 2012-09-06 NOTE — Progress Notes (Signed)
Post-discharge UR chart review completed. 

## 2012-09-06 NOTE — Progress Notes (Signed)
Percocet prescription given to patient. No more narcotic prescriptions to be given for this indication (pain after SVD).

## 2012-09-08 DIAGNOSIS — O0990 Supervision of high risk pregnancy, unspecified, unspecified trimester: Secondary | ICD-10-CM | POA: Insufficient documentation

## 2012-09-09 ENCOUNTER — Ambulatory Visit: Payer: Medicaid Other | Admitting: *Deleted

## 2012-09-09 VITALS — BP 145/98 | HR 84 | Temp 97.8°F | Resp 20 | Ht 62.0 in | Wt 161.9 lb

## 2012-09-09 DIAGNOSIS — I10 Essential (primary) hypertension: Secondary | ICD-10-CM

## 2012-09-09 NOTE — Progress Notes (Signed)
BP result reported to Dr. Macon Large- no change to meds @ this time.  Pt reports that she notices her neck swelling when she eats. Pt was advised that this sx is not repated to her pregnancy or BP. If it persists, she needs evaluation by PCP. Pt voiced understanding.

## 2012-09-10 LAB — WET PREP, GENITAL: Trich, Wet Prep: NONE SEEN

## 2012-09-12 ENCOUNTER — Encounter: Payer: Self-pay | Admitting: Obstetrics and Gynecology

## 2012-09-21 ENCOUNTER — Ambulatory Visit: Payer: Medicaid Other | Admitting: Obstetrics and Gynecology

## 2012-09-28 ENCOUNTER — Ambulatory Visit (INDEPENDENT_AMBULATORY_CARE_PROVIDER_SITE_OTHER): Payer: Medicaid Other | Admitting: Obstetrics and Gynecology

## 2012-09-28 ENCOUNTER — Encounter: Payer: Self-pay | Admitting: Obstetrics and Gynecology

## 2012-09-28 VITALS — BP 162/107 | HR 99 | Resp 20 | Ht 62.0 in | Wt 161.7 lb

## 2012-09-28 DIAGNOSIS — Z302 Encounter for sterilization: Secondary | ICD-10-CM | POA: Insufficient documentation

## 2012-09-28 DIAGNOSIS — I10 Essential (primary) hypertension: Secondary | ICD-10-CM

## 2012-09-28 DIAGNOSIS — Z3049 Encounter for surveillance of other contraceptives: Secondary | ICD-10-CM

## 2012-09-28 MED ORDER — MEDROXYPROGESTERONE ACETATE 150 MG/ML IM SUSP
150.0000 mg | Freq: Once | INTRAMUSCULAR | Status: AC
  Administered 2012-09-28: 150 mg via INTRAMUSCULAR

## 2012-09-28 MED ORDER — LABETALOL HCL 200 MG PO TABS: 600.0000 mg | ORAL_TABLET | Freq: Two times a day (BID) | ORAL | Status: DC

## 2012-09-28 NOTE — Progress Notes (Signed)
Patient ID: [name], female   DOB: [birth year], 36 y.o.   MRN: [id number] Pt requests return to work note and would like to schedule BTL.

## 2012-09-28 NOTE — Progress Notes (Signed)
  Subjective:     [name] is a 36 y.o. female who presents for a postpartum visit. She is 4 weeks postpartum following a spontaneous vaginal delivery. I have fully reviewed the prenatal and intrapartum course. The delivery was at 23 gestational weeks. Outcome: spontaneous vaginal delivery and PPROM at 23 weeks. Anesthesia: none. Postpartum course has been uncomplicated. Baby's course has resulted in a fetal demise due to prematurity. Bleeding no bleeding. Bowel function is normal. Bladder function is normal. Patient is not sexually active. Contraception method is none. Postpartum depression screening: negative. Patient is planning for BTL for birth control but desires depo-provera until BTL can be scheduled.   Review of Systems A comprehensive review of systems was negative.   Objective:    BP 162/107  Pulse 99  Resp 20  Ht 5\' 2"  (1.575 m)  Wt 161 lb 11.2 oz (73.347 kg)  BMI 29.58 kg/m2  Breastfeeding? Unknown  General:  alert, cooperative and no distress   Breasts:  inspection negative, no nipple discharge or bleeding, no masses or nodularity palpable  Lungs: clear to auscultation bilaterally  Heart:  regular rate and rhythm  Abdomen: soft, non-tender; bowel sounds normal; no masses,  no organomegaly   Vulva:  normal  Vagina: normal vagina, no discharge, exudate, lesion, or erythema  Cervix:  multiparous appearance  Corpus: normal size, contour, position, consistency, mobility, non-tender  Adnexa:  no mass, fullness, tenderness  Rectal Exam: Not performed.        Assessment:    postpartum exam. Pap smear not done at today's visit.   Plan:    1. Contraception: tubal ligation. Patient will be scheduled for laparoscopic tubal ligation. Risks, benefits and alternatives were explained including but not limited to risks of bleeding, infection and damage to adjacent organs. Patient verbalized understanding and all questions were answered 2. Elevated BP today. Patient has not  been taking her labetalol 600 BID. New prescription provided Depo-Provera today- patient advised to use back up birth control method 3. Follow up for post-op visit or as needed.

## 2012-09-28 NOTE — Addendum Note (Signed)
Addended by: Catalina Antigua on: 09/28/2012 02:25 PM   Modules accepted: Orders

## 2012-09-29 ENCOUNTER — Encounter: Payer: Self-pay | Admitting: Obstetrics and Gynecology

## 2012-10-08 ENCOUNTER — Emergency Department (HOSPITAL_COMMUNITY)
Admission: EM | Admit: 2012-10-08 | Discharge: 2012-10-08 | Disposition: A | Discharge status: Home / Self Care | Payer: Medicaid Other | Attending: Emergency Medicine | Admitting: Emergency Medicine

## 2012-10-08 ENCOUNTER — Encounter (HOSPITAL_COMMUNITY): Payer: Self-pay | Admitting: *Deleted

## 2012-10-08 DIAGNOSIS — K0889 Other specified disorders of teeth and supporting structures: Secondary | ICD-10-CM

## 2012-10-08 DIAGNOSIS — J45909 Unspecified asthma, uncomplicated: Secondary | ICD-10-CM | POA: Insufficient documentation

## 2012-10-08 DIAGNOSIS — F411 Generalized anxiety disorder: Secondary | ICD-10-CM | POA: Insufficient documentation

## 2012-10-08 DIAGNOSIS — Z8619 Personal history of other infectious and parasitic diseases: Secondary | ICD-10-CM | POA: Insufficient documentation

## 2012-10-08 DIAGNOSIS — F329 Major depressive disorder, single episode, unspecified: Secondary | ICD-10-CM | POA: Insufficient documentation

## 2012-10-08 DIAGNOSIS — F1110 Opioid abuse, uncomplicated: Secondary | ICD-10-CM | POA: Insufficient documentation

## 2012-10-08 DIAGNOSIS — K089 Disorder of teeth and supporting structures, unspecified: Secondary | ICD-10-CM | POA: Insufficient documentation

## 2012-10-08 DIAGNOSIS — F3289 Other specified depressive episodes: Secondary | ICD-10-CM | POA: Insufficient documentation

## 2012-10-08 DIAGNOSIS — K219 Gastro-esophageal reflux disease without esophagitis: Secondary | ICD-10-CM | POA: Insufficient documentation

## 2012-10-08 MED ORDER — OXYCODONE-ACETAMINOPHEN 5-325 MG PO TABS: 1.0000 | ORAL_TABLET | Freq: Four times a day (QID) | ORAL | Status: DC | PRN

## 2012-10-08 MED ORDER — PENICILLIN V POTASSIUM 500 MG PO TABS: 500.0000 mg | ORAL_TABLET | Freq: Four times a day (QID) | ORAL | Status: DC

## 2012-10-08 NOTE — ED Notes (Signed)
Pt states that she broke a piece of tooth off on the LL rear jaw two days ago, and since then pt has been experiencing LL jaw, neck and head pain. Pain is constant, throbbing, 9/10. Remanent tooth is sensitive to hot/cold. Pt has tried Tylenol with no relief.

## 2012-10-08 NOTE — ED Provider Notes (Signed)
History     CSN: [id number]  Arrival date & time 10/08/12  1025   First MD Initiated Contact with Patient 10/08/12 1030      Chief Complaint  Patient presents with  . Dental Pain    (Consider location/radiation/quality/duration/timing/severity/associated sxs/prior treatment) HPI Comments: Patient presents with dental pain. Patient states that two days ago a pieced of her tooth broke while eating. She states that pain is her in her left lower jaw,10/10, with radiation to the neck and jaw. She has tried tylenol without improvement. Denies fever or chills. Denies NVD or abdominal pain. Denies trismus, or swelling of her tongue or neck.  The history is provided by the patient. No language interpreter was used.    Past Medical History  Diagnosis Date  . Anxiety   . GERD (gastroesophageal reflux disease)   . STD (female)     HX of STD's  . Abnormal Pap smear 1998    had colpo  . HCV infection     HX of Hep C  . 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 Bicornuate uterus   . Depression   . Hypertension 2010  . Bicornate uterus   . Headache   . Chlamydia 2000  . Asthma     childhood asthma  . PONV (postoperative nausea and vomiting)   . IUP (intrauterine pregnancy), incidental 13 weeks    Past Surgical History  Procedure Date  . Therapeutic abortion   . Therapeutic abortion   . Tonsillectomy   . Dilation and curettage of uterus     SAB  . Finger surgery 2003    reattachment of right forefinger  . Cervical cerclage 09/01/2011    Procedure: CERCLAGE CERVICAL;  Surgeon: Reva Bores, MD;  Location: [facility];  Service: Gynecology;  Laterality: N/A;  . Cervical cerclage 06/28/2012    Procedure: CERCLAGE CERVICAL;  Surgeon: Reva Bores, MD;  Location: [facility];  Service: Gynecology;  Laterality: N/A;    Family History  Problem Relation Age of Onset  . Hypertension Mother   . Thyroid disease Maternal Grandmother   . Diabetes Paternal Grandfather   . Anesthesia  problems Neg Hx   . Other Neg Hx     History  Substance Use Topics  . Smoking status: Current Some Day Smoker -- 0.5 packs/day for 20 years    Types: Cigarettes  . Smokeless tobacco: Former Neurosurgeon    Quit date: 05/24/2012     Comment: nicotine patch currently  . Alcohol Use: No    OB History    Grav Para Term Preterm Abortions TAB SAB Ect Mult Living   7 4 2 2 3 2 1  0 0 3      Review of Systems  Constitutional: Negative for fever and chills.  HENT: Positive for dental problem.   Eyes: Negative for visual disturbance.  Gastrointestinal: Negative for nausea, vomiting, abdominal pain and diarrhea.    Allergies  Vicodin  Home Medications   Current Outpatient Rx  Name  Route  Sig  Dispense  Refill  . DOCUSATE SODIUM 100 MG PO CAPS   Oral   Take 1 capsule (100 mg total) by mouth 2 (two) times daily as needed for constipation.   30 capsule   2   . FERROUS SULFATE 325 (65 FE) MG PO TABS   Oral   Take 1 tablet (325 mg total) by mouth 2 (two) times daily.   60 tablet   1   .  IBUPROFEN 800 MG PO TABS   Oral   Take 1 tablet (800 mg total) by mouth 2 (two) times daily as needed for pain.   60 tablet   0   . LABETALOL HCL 200 MG PO TABS   Oral   Take 3 tablets (600 mg total) by mouth 2 (two) times daily.   60 tablet   1   . OXYCODONE-ACETAMINOPHEN 5-325 MG PO TABS   Oral   Take 1-2 tablets by mouth every 6 (six) hours as needed for pain (moderate - severe pain).   30 tablet   0   . PRENATAL MULTIVITAMIN CH   Oral   Take 1 tablet by mouth daily.           BP 149/101  Pulse 87  Temp 98.7 F (37.1 C) (Oral)  Resp 14  SpO2 100%  Breastfeeding? Unknown  Physical Exam  Nursing note and vitals reviewed. Constitutional: She appears well-developed and well-nourished.  HENT:  Head: Normocephalic and atraumatic.  Mouth/Throat: Oropharynx is clear and moist. Dental caries present.    Eyes: Conjunctivae normal and EOM are normal. No scleral icterus.    Cardiovascular: Normal rate, regular rhythm and normal heart sounds.   Pulmonary/Chest: Effort normal and breath sounds normal.  Abdominal: Soft. There is no tenderness.  Neurological: She is alert.  Skin: Skin is warm.    ED Course  Procedures (including critical care time)  Labs Reviewed - No data to display No results found.   1. Pain, dental       MDM  Patient presented with dental pain. Given pain medication in ED with improvement. Discharged with ABX, short course of pain medication, and dental referral. Return precautions given. No red flags for Ludwig's angina.        Pixie Casino, PA-C 10/08/12 1048

## 2012-10-09 NOTE — ED Provider Notes (Signed)
Medical screening examination/treatment/procedure(s) were performed by non-physician practitioner and as supervising physician I was immediately available for consultation/collaboration.   Keola Heninger E Sione Baumgarten, MD 10/09/12 0714 

## 2012-11-01 ENCOUNTER — Other Ambulatory Visit: Payer: Self-pay | Admitting: *Deleted

## 2012-11-01 NOTE — Telephone Encounter (Signed)
Pt called requesting a refill on her labetalol.

## 2012-11-02 NOTE — Telephone Encounter (Signed)
[name] called back and states she was called by the pharmacy and has picked up her prescription. States the problem was that she is taking so much that she was running out too fast, but states they gave her 120 tablets today.

## 2012-11-02 NOTE — Telephone Encounter (Signed)
Called patient and left a message we are returning your call, please call clinic. Per chart review patient had labetolol refilled at her postpartum visit in November with refills- sent to [facility]- need to review with patient when she is reached by phone.

## 2012-11-03 NOTE — Telephone Encounter (Signed)
Please remind patient that she needs to be seen by a primary care physician for the management of her HTN.

## 2012-11-09 NOTE — Telephone Encounter (Signed)
Called pt and informed her that Dr. Jolayne Panther wants her to be seen by her PCP for management of HTN. Our MD's do not manage this type of problem on a long-term basis since she is no longer pregnant. She should be seen within a month.  Pt voiced understanding and stated that she does not currently have a PCP but will be able to get one because she will have health insurance through "[employer]".

## 2012-11-23 HISTORY — PX: TUBAL LIGATION: SHX77

## 2012-11-26 ENCOUNTER — Encounter (HOSPITAL_COMMUNITY): Payer: Self-pay | Admitting: Pharmacy Technician

## 2012-12-02 MED ORDER — BUPIVACAINE HCL (PF) 0.25 % IJ SOLN
INTRAMUSCULAR | Status: AC
  Filled 2012-12-02: qty 30

## 2012-12-05 ENCOUNTER — Encounter (HOSPITAL_COMMUNITY)
Admission: RE | Admit: 2012-12-05 | Discharge: 2012-12-05 | Disposition: A | Discharge status: Home / Self Care | Payer: Medicaid Other | Source: Ambulatory Visit | Attending: Obstetrics and Gynecology | Admitting: Obstetrics and Gynecology

## 2012-12-05 ENCOUNTER — Encounter (HOSPITAL_COMMUNITY): Payer: Self-pay

## 2012-12-05 LAB — COMPREHENSIVE METABOLIC PANEL
Alkaline Phosphatase: 49 U/L (ref 39–117)
BUN: 7 mg/dL (ref 6–23)
Chloride: 103 mEq/L (ref 96–112)
GFR calc Af Amer: >90 mL/min (ref >90)
Glucose, Bld: 121 mg/dL — ABNORMAL HIGH (ref 70–99)
Potassium: 3.8 mEq/L (ref 3.5–5.1)
Total Bilirubin: 0.2 mg/dL — ABNORMAL LOW (ref 0.3–1.2)

## 2012-12-05 LAB — CBC
HCT: 36.7 % (ref 36.0–46.0)
MCHC: 31.9 g/dL (ref 30.0–36.0)
Platelets: 240 10*3/uL (ref 150–400)
RDW: 16.1 % — ABNORMAL HIGH (ref 11.5–15.5)
WBC: 8.3 10*3/uL (ref 4.0–10.5)

## 2012-12-05 NOTE — Op Note (Signed)
[name] 12/06/2012  PREOPERATIVE DIAGNOSIS:  Undesired fertility  POSTOPERATIVE DIAGNOSIS:  Undesired fertility  PROCEDURE:  Laparoscopic bilateral salpingectomy  SURGEON: Dr. Catalina Antigua  ANESTHESIA:  General endotracheal  COMPLICATIONS:  None immediate.  ESTIMATED BLOOD LOSS:  Less than 20 ml.  FLUIDS: 500 ml LR.  URINE OUTPUT:  100 ml of clear urine.  SPECIMEN: left and right fallopian tubes  INDICATIONS: 37 y.o. [id number]  with undesired fertility, desires permanent sterilization. Other reversible forms of contraception were discussed with patient; she declines all other modalities.  Risks of procedure discussed with patient including permanence of method, bleeding, infection, injury to surrounding organs and need for additional procedures including laparotomy, risk of regret.  Failure risk of 0.5-1% with increased risk of ectopic gestation if pregnancy occurs was also discussed with patient.      FINDINGS:  Normal uterus, tubes, and ovaries.  TECHNIQUE:  The patient was taken to the operating room where general anesthesia was obtained without difficulty.  She was then placed in the dorsal lithotomy position and prepared and draped in sterile fashion.  After an adequate timeout was performed, a bivalved speculum was then placed in the patient's vagina, and the anterior lip of cervix grasped with the single-tooth tenaculum.  The uterine manipulator was then advanced into the uterus.  The speculum was removed from the vagina.  Attention was then turned to the patient's abdomen where a 10-mm skin incision was made on the umbilical fold.  The underlying fascia was identified, grasped with Kocher clamps, tented up and entered sharply with mayo scissors. The fascia was tagged with 0-Vicryl. The peritoneum was entered bluntly.The 11 mm trocar and sleeve were introduced into the abdominal cavityl.  Intraperitoneal placement was confirmed with the use of the laparoscope.  Pneumoperitoneum was achieved by the insufflation of CO2 gas. A survey of the patient's pelvis and abdomen revealed entirely normal anatomy.  The fallopian tubes were observed and found to be normal in appearance. Using the Enseal devise, a salpingectomy was performed on the right side by transecting the mesosalpinx and transecting the tube at the cornua. Similar steps were performed on the left fallopian tube allowing for bilateral salpingectomy.   Good hemostasis was noted overall. The specimens were removed through the 10 mm-trocar using a 5-mm laparoscope. The instruments were then removed from the patient's abdomen and the fascial incision was repaired with 0 Vicryl, and the skin was closed with 4-0 Vicryl. The uterine manipulator and the tenaculum were removed from the vagina without complications. The patient tolerated the procedure well.  Sponge, lap, and needle counts were correct times two.  The patient was then taken to the recovery room awake, extubated and in stable  in stable condition.

## 2012-12-05 NOTE — Patient Instructions (Addendum)
22 [name]  12/05/2012   Your procedure is scheduled on:  12/06/12  Enter through the Main Entrance of [facility] at 8 AM.  Pick up the phone at the desk and dial [phone].   Call this number if you have problems the morning of surgery: [phone]   Remember:   Do not eat food:After Midnight.  Do not drink clear liquids: After Midnight.  Take these medicines the morning of surgery with A SIP OF WATER: Blood pressure medication   Do not wear jewelry, make-up or nail polish.  Do not wear lotions, powders, or perfumes. You may wear deodorant.  Do not shave 48 hours prior to surgery.  Do not bring valuables to the hospital.  Contacts, dentures or bridgework may not be worn into surgery.  Leave suitcase in the car. After surgery it may be brought to your room.  For patients admitted to the hospital, checkout time is 11:00 AM the day of discharge.   Patients discharged the day of surgery will not be allowed to drive home.  Name and phone number of your driver: grandmother  Pryor Ochoa Debbie Croft)  Special Instructions: Shower using CHG 2 nights before surgery and the night before surgery.  If you shower the day of surgery use CHG.  Use special wash - you have one bottle of CHG for all showers.  You should use approximately 1/3 of the bottle for each shower.   Please read over the following fact sheets that you were given: MRSA Information

## 2012-12-05 NOTE — H&P (Signed)
[name] is an 37 y.o. female [phone] with undesired fertility presenting for scheduled permanent sterilization procedure  Pertinent Gynecological History: Menses: flow is moderate Bleeding: monthly Contraception: Depo-Provera injections DES exposure: denies Blood transfusions: none Sexually transmitted diseases: no past history Previous GYN Procedures: DNC  Last mammogram: n/a  Last pap: normal Date: 05/2012 OB History: G7, J8119   Menstrual History:  No LMP recorded.    Past Medical History  Diagnosis Date  . Anxiety   . GERD (gastroesophageal reflux disease)   . STD (female)     HX of STD's  . Abnormal Pap smear 1998    had colpo  . 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 Bicornuate uterus   . Hypertension 2010  . Bicornate uterus   . Headache   . Chlamydia 2000  . IUP (intrauterine pregnancy), incidental 13 weeks  . PONV (postoperative nausea and vomiting)   . Asthma     ,as child only   no inhaler  . HCV infection     HX of Hep C    Past Surgical History  Procedure Date  . Therapeutic abortion   . Therapeutic abortion   . Tonsillectomy   . Dilation and curettage of uterus     SAB  . Finger surgery 2003    reattachment of right forefinger  . Cervical cerclage 09/01/2011    Procedure: CERCLAGE CERVICAL;  Surgeon: Reva Bores, MD;  Location: [facility];  Service: Gynecology;  Laterality: N/A;  . Cervical cerclage 06/28/2012    Procedure: CERCLAGE CERVICAL;  Surgeon: Reva Bores, MD;  Location: [facility];  Service: Gynecology;  Laterality: N/A;    Family History  Problem Relation Age of Onset  . Hypertension Mother   . Thyroid disease Maternal Grandmother   . Diabetes Paternal Grandfather   . Anesthesia problems Neg Hx   . Other Neg Hx     Social History:  reports that she has been smoking Cigarettes.  She has a 20 pack-year smoking history. She has never used smokeless tobacco. She reports that she does not drink alcohol or use illicit  drugs.  Allergies:  Allergies  Allergen Reactions  . Vicodin (Hydrocodone-Acetaminophen) Nausea And Vomiting    Prescriptions prior to admission  Medication Sig Dispense Refill  . labetalol (NORMODYNE) 200 MG tablet Take 3 tablets (600 mg total) by mouth 2 (two) times daily.  60 tablet  1  . GARCINIA CAMBOGIA-CHROMIUM PO Take 2 capsules by mouth daily.      Marland Kitchen ibuprofen (ADVIL,MOTRIN) 200 MG tablet Take 400-600 mg by mouth every 6 (six) hours as needed. For pain or headaches        Review of Systems  All other systems reviewed and are negative.    Blood pressure 130/94, pulse 83, temperature 98.2 F (36.8 C), temperature source Oral, resp. rate 20, height 5\' 2"  (1.575 m), weight 160 lb (72.576 kg), SpO2 100.00%, not currently breastfeeding. Physical Exam GENERAL: Well-developed, well-nourished female in no acute distress.  HEENT: Normocephalic, atraumatic. Sclerae anicteric.  NECK: Supple. Normal thyroid.  LUNGS: Clear to auscultation bilaterally.  HEART: Regular rate and rhythm. BREASTS: Symmetric in size. No palpable masses or lymphadenopathy, skin changes, or nipple drainage. ABDOMEN: Soft, nontender, nondistended. No organomegaly. PELVIC: deferred to OR EXTREMITIES: No cyanosis, clubbing, or edema, 2+ distal pulses.  Results for orders placed during the hospital encounter of 12/06/12 (from the past 24 hour(s))  PREGNANCY, URINE     Status: Normal  Collection Time   12/06/12  8:00 AM      Component Value Range   Preg Test, Ur NEGATIVE  NEGATIVE    No results found.  Assessment/Plan: 37 yo with undesired fertility here for permanent sterilization. Other reversible forms of contraception were discussed with patient; she declines all other modalities.  Risks of procedure discussed with patient including permanence of method, bleeding, infection, injury to surrounding organs and need for additional procedures including laparotomy, risk of regret.  Failure risk of 0.5-1% with  increased risk of ectopic gestation if pregnancy occurs was also discussed with patient.  Patient verbalized understanding and all questions were answered. Consent signed for laparoscopic bilateral salpingectomy.

## 2012-12-06 ENCOUNTER — Encounter (HOSPITAL_COMMUNITY): Payer: Self-pay | Admitting: Anesthesiology

## 2012-12-06 ENCOUNTER — Encounter (HOSPITAL_COMMUNITY)
Admission: RE | Disposition: A | Discharge status: Home / Self Care | Payer: Self-pay | Source: Ambulatory Visit | Attending: Obstetrics and Gynecology

## 2012-12-06 ENCOUNTER — Ambulatory Visit (HOSPITAL_COMMUNITY): Payer: Medicaid Other | Admitting: Anesthesiology

## 2012-12-06 ENCOUNTER — Ambulatory Visit (HOSPITAL_COMMUNITY)
Admission: RE | Admit: 2012-12-06 | Discharge: 2012-12-06 | Disposition: A | Discharge status: Home / Self Care | Payer: Medicaid Other | Source: Ambulatory Visit | Attending: Obstetrics and Gynecology | Admitting: Obstetrics and Gynecology

## 2012-12-06 DIAGNOSIS — Z01812 Encounter for preprocedural laboratory examination: Secondary | ICD-10-CM | POA: Insufficient documentation

## 2012-12-06 DIAGNOSIS — Z01818 Encounter for other preprocedural examination: Secondary | ICD-10-CM | POA: Insufficient documentation

## 2012-12-06 DIAGNOSIS — Z302 Encounter for sterilization: Secondary | ICD-10-CM

## 2012-12-06 HISTORY — PX: BILATERAL SALPINGECTOMY: SHX5743

## 2012-12-06 LAB — PREGNANCY, URINE: Preg Test, Ur: NEGATIVE

## 2012-12-06 SURGERY — SALPINGECTOMY, BILATERAL, OPEN
Anesthesia: General | Site: Abdomen | Laterality: Bilateral | Wound class: Clean Contaminated

## 2012-12-06 MED ORDER — DOCUSATE SODIUM 100 MG PO CAPS: 100.0000 mg | ORAL_CAPSULE | Freq: Two times a day (BID) | ORAL | Status: DC | PRN

## 2012-12-06 MED ORDER — DEXAMETHASONE SODIUM PHOSPHATE 10 MG/ML IJ SOLN
INTRAMUSCULAR | Status: AC
  Filled 2012-12-06: qty 1

## 2012-12-06 MED ORDER — FENTANYL CITRATE 0.05 MG/ML IJ SOLN
INTRAMUSCULAR | Status: DC | PRN
  Administered 2012-12-06 (×2): 100 ug via INTRAVENOUS
  Administered 2012-12-06: 50 ug via INTRAVENOUS

## 2012-12-06 MED ORDER — PROPOFOL 10 MG/ML IV EMUL
INTRAVENOUS | Status: AC
  Filled 2012-12-06: qty 20

## 2012-12-06 MED ORDER — LIDOCAINE HCL (CARDIAC) 20 MG/ML IV SOLN
INTRAVENOUS | Status: AC
  Filled 2012-12-06: qty 5

## 2012-12-06 MED ORDER — FENTANYL CITRATE 0.05 MG/ML IJ SOLN
INTRAMUSCULAR | Status: AC
  Administered 2012-12-06: 50 ug via INTRAVENOUS
  Filled 2012-12-06: qty 2

## 2012-12-06 MED ORDER — BUPIVACAINE HCL (PF) 0.25 % IJ SOLN
INTRAMUSCULAR | Status: AC
  Filled 2012-12-06: qty 30

## 2012-12-06 MED ORDER — KETOROLAC TROMETHAMINE 30 MG/ML IJ SOLN
15.0000 mg | Freq: Once | INTRAMUSCULAR | Status: AC | PRN
  Administered 2012-12-06: 30 mg via INTRAVENOUS

## 2012-12-06 MED ORDER — KETOROLAC TROMETHAMINE 30 MG/ML IJ SOLN
INTRAMUSCULAR | Status: AC
  Administered 2012-12-06: 30 mg via INTRAVENOUS
  Filled 2012-12-06: qty 1

## 2012-12-06 MED ORDER — OXYCODONE-ACETAMINOPHEN 5-325 MG PO TABS: 1.0000 | ORAL_TABLET | ORAL | Status: DC | PRN

## 2012-12-06 MED ORDER — EPHEDRINE 5 MG/ML INJ
INTRAVENOUS | Status: AC
  Filled 2012-12-06: qty 10

## 2012-12-06 MED ORDER — MIDAZOLAM HCL 2 MG/2ML IJ SOLN
INTRAMUSCULAR | Status: AC
  Filled 2012-12-06: qty 2

## 2012-12-06 MED ORDER — IBUPROFEN 600 MG PO TABS: 600.0000 mg | ORAL_TABLET | Freq: Four times a day (QID) | ORAL | Status: DC | PRN

## 2012-12-06 MED ORDER — FENTANYL CITRATE 0.05 MG/ML IJ SOLN
INTRAMUSCULAR | Status: AC
  Filled 2012-12-06: qty 5

## 2012-12-06 MED ORDER — BUPIVACAINE HCL (PF) 0.25 % IJ SOLN
INTRAMUSCULAR | Status: DC | PRN
  Administered 2012-12-06: 2 mL
  Administered 2012-12-06: 8 mL

## 2012-12-06 MED ORDER — ROCURONIUM BROMIDE 100 MG/10ML IV SOLN
INTRAVENOUS | Status: DC | PRN
  Administered 2012-12-06: 10 mg via INTRAVENOUS
  Administered 2012-12-06: 30 mg via INTRAVENOUS

## 2012-12-06 MED ORDER — EPHEDRINE SULFATE 50 MG/ML IJ SOLN
INTRAMUSCULAR | Status: DC | PRN
  Administered 2012-12-06: 10 mg via INTRAVENOUS

## 2012-12-06 MED ORDER — FENTANYL CITRATE 0.05 MG/ML IJ SOLN
25.0000 ug | INTRAMUSCULAR | Status: DC | PRN
  Administered 2012-12-06 (×2): 50 ug via INTRAVENOUS

## 2012-12-06 MED ORDER — ONDANSETRON HCL 4 MG/2ML IJ SOLN
INTRAMUSCULAR | Status: DC | PRN
  Administered 2012-12-06: 4 mg via INTRAVENOUS

## 2012-12-06 MED ORDER — GLYCOPYRROLATE 0.2 MG/ML IJ SOLN
INTRAMUSCULAR | Status: AC
  Filled 2012-12-06: qty 2

## 2012-12-06 MED ORDER — MIDAZOLAM HCL 5 MG/5ML IJ SOLN
INTRAMUSCULAR | Status: DC | PRN
  Administered 2012-12-06: 2 mg via INTRAVENOUS

## 2012-12-06 MED ORDER — PROPOFOL 10 MG/ML IV EMUL
INTRAVENOUS | Status: DC | PRN
  Administered 2012-12-06: 200 mg via INTRAVENOUS

## 2012-12-06 MED ORDER — DEXAMETHASONE SODIUM PHOSPHATE 4 MG/ML IJ SOLN
INTRAMUSCULAR | Status: DC | PRN
  Administered 2012-12-06: 10 mg via INTRAVENOUS

## 2012-12-06 MED ORDER — ACETAMINOPHEN 10 MG/ML IV SOLN
1000.0000 mg | Freq: Once | INTRAVENOUS | Status: DC | PRN
  Filled 2012-12-06: qty 100

## 2012-12-06 MED ORDER — ROCURONIUM BROMIDE 50 MG/5ML IV SOLN
INTRAVENOUS | Status: AC
  Filled 2012-12-06: qty 1

## 2012-12-06 MED ORDER — LACTATED RINGERS IV SOLN
INTRAVENOUS | Status: DC
  Administered 2012-12-06: 08:00:00 via INTRAVENOUS

## 2012-12-06 MED ORDER — ONDANSETRON HCL 4 MG/2ML IJ SOLN
INTRAMUSCULAR | Status: AC
  Filled 2012-12-06: qty 2

## 2012-12-06 MED ORDER — NEOSTIGMINE METHYLSULFATE 1 MG/ML IJ SOLN
INTRAMUSCULAR | Status: AC
  Filled 2012-12-06: qty 1

## 2012-12-06 MED ORDER — GLYCOPYRROLATE 0.2 MG/ML IJ SOLN
INTRAMUSCULAR | Status: AC
  Filled 2012-12-06: qty 1

## 2012-12-06 MED ORDER — GLYCOPYRROLATE 0.2 MG/ML IJ SOLN
INTRAMUSCULAR | Status: DC | PRN
  Administered 2012-12-06: 0.2 mg via INTRAVENOUS

## 2012-12-06 MED ORDER — SCOPOLAMINE 1 MG/3DAYS TD PT72
MEDICATED_PATCH | TRANSDERMAL | Status: AC
  Filled 2012-12-06: qty 1

## 2012-12-06 MED ORDER — LIDOCAINE HCL (CARDIAC) 20 MG/ML IV SOLN
INTRAVENOUS | Status: DC | PRN
  Administered 2012-12-06: 100 mg via INTRAVENOUS

## 2012-12-06 MED ORDER — LACTATED RINGERS IV SOLN
INTRAVENOUS | Status: DC | PRN
  Administered 2012-12-06: 09:00:00 via INTRAVENOUS

## 2012-12-06 SURGICAL SUPPLY — 21 items
CHLORAPREP W/TINT 26ML (MISCELLANEOUS) ×2 IMPLANT
DERMABOND ADVANCED (GAUZE/BANDAGES/DRESSINGS) ×1
DERMABOND ADVANCED .7 DNX12 (GAUZE/BANDAGES/DRESSINGS) ×1 IMPLANT
ELECT REM PT RETURN 9FT ADLT (ELECTROSURGICAL)
ELECTRODE REM PT RTRN 9FT ADLT (ELECTROSURGICAL) IMPLANT
GLOVE BIOGEL PI IND STRL 6.5 (GLOVE) ×2 IMPLANT
GLOVE BIOGEL PI INDICATOR 6.5 (GLOVE) ×2
GLOVE SURG SS PI 6.0 STRL IVOR (GLOVE) ×2 IMPLANT
GOWN PREVENTION PLUS LG XLONG (DISPOSABLE) ×4 IMPLANT
NS IRRIG 1000ML POUR BTL (IV SOLUTION) IMPLANT
PACK LAPAROSCOPY BASIN (CUSTOM PROCEDURE TRAY) ×2 IMPLANT
SEALER TISSUE G2 CVD JAW 35 (ENDOMECHANICALS) ×1 IMPLANT
SEALER TISSUE G2 CVD JAW 45CM (ENDOMECHANICALS) ×1
SUT MON AB 4-0 PS1 27 (SUTURE) ×2 IMPLANT
SUT VICRYL 0 UR6 27IN ABS (SUTURE) ×2 IMPLANT
TOWEL OR 17X24 6PK STRL BLUE (TOWEL DISPOSABLE) ×4 IMPLANT
TRAY FOLEY BAG SILVER LF 14FR (CATHETERS) ×2 IMPLANT
TROCAR BALLN 12MMX100 BLUNT (TROCAR) ×2 IMPLANT
TROCAR XCEL NON-BLD 11X100MML (ENDOMECHANICALS) IMPLANT
TROCAR XCEL NON-BLD 5MMX100MML (ENDOMECHANICALS) ×4 IMPLANT
WATER STERILE IRR 1000ML POUR (IV SOLUTION) ×2 IMPLANT

## 2012-12-06 NOTE — Anesthesia Preprocedure Evaluation (Addendum)
Anesthesia Evaluation  Patient identified by MRN, date of birth, ID band Patient awake    Reviewed: Allergy & Precautions, H&P , NPO status , Patient's Chart, lab work & pertinent test results, reviewed documented beta blocker date and time   History of Anesthesia Complications (+) PONV  Airway Mallampati: II TM Distance: >3 FB Neck ROM: full    Dental  (+) Teeth Intact   Pulmonary Current Smoker,  breath sounds clear to auscultation  Pulmonary exam normal       Cardiovascular Exercise Tolerance: Good hypertension, On Home Beta Blockers Rhythm:regular Rate:Normal     Neuro/Psych negative neurological ROS  negative psych ROS   GI/Hepatic negative GI ROS, (+)     substance abuse (h/o IVDA - none in years)   , Hepatitis - (no current treatment), C  Endo/Other  negative endocrine ROS  Renal/GU negative Renal ROS  negative genitourinary   Musculoskeletal   Abdominal Normal abdominal exam  (+)   Peds  Hematology negative hematology ROS (+)   Anesthesia Other Findings   Reproductive/Obstetrics negative OB ROS                          Anesthesia Physical Anesthesia Plan  ASA: II  Anesthesia Plan: General ETT   Post-op Pain Management:    Induction:   Airway Management Planned:   Additional Equipment:   Intra-op Plan:   Post-operative Plan:   Informed Consent: I have reviewed the patients History and Physical, chart, labs and discussed the procedure including the risks, benefits and alternatives for the proposed anesthesia with the patient or authorized representative who has indicated his/her understanding and acceptance.   Dental Advisory Given  Plan Discussed with: CRNA and Surgeon  Anesthesia Plan Comments:         Anesthesia Quick Evaluation

## 2012-12-06 NOTE — Transfer of Care (Signed)
Immediate Anesthesia Transfer of Care Note  Patient: [name]  Procedure(s) Performed: Procedure(s) (LRB) with comments: BILATERAL SALPINGECTOMY (Bilateral) - laparoscopic  Patient Location: PACU  Anesthesia Type:General  Level of Consciousness: awake, alert  and oriented  Airway & Oxygen Therapy: Patient Spontanous Breathing  Post-op Assessment: Report given to PACU RN  Post vital signs: Reviewed and stable  Complications: No apparent anesthesia complications

## 2012-12-06 NOTE — Anesthesia Postprocedure Evaluation (Signed)
Anesthesia Post Note  Patient: [name]  Procedure(s) Performed: Procedure(s) (LRB): BILATERAL SALPINGECTOMY (Bilateral)  Anesthesia type: General  Patient location: PACU  Post pain: Pain level controlled  Post assessment: Post-op Vital signs reviewed  Last Vitals:  Filed Vitals:   12/06/12 1100  BP: 122/71  Pulse: 64  Temp: 36.8 C  Resp: 12    Post vital signs: Reviewed  Level of consciousness: sedated  Complications: No apparent anesthesia complications

## 2012-12-06 NOTE — Anesthesia Procedure Notes (Signed)
Procedure Name: Intubation Date/Time: 12/06/2012 9:19 AM Performed by: Arletta Lumadue, Jannet Askew Pre-anesthesia Checklist: Patient identified, Patient being monitored, Emergency Drugs available, Timeout performed and Suction available Patient Re-evaluated:Patient Re-evaluated prior to inductionOxygen Delivery Method: Circle system utilized Preoxygenation: Pre-oxygenation with 100% oxygen Intubation Type: IV induction Ventilation: Mask ventilation without difficulty Laryngoscope Size: Mac and 4 Grade View: Grade I Tube type: Oral Tube size: 7.0 mm Number of attempts: 1 Placement Confirmation: ETT inserted through vocal cords under direct vision,  breath sounds checked- equal and bilateral and positive ETCO2 Secured at: 22 cm Dental Injury: Teeth and Oropharynx as per pre-operative assessment

## 2012-12-07 ENCOUNTER — Encounter (HOSPITAL_COMMUNITY): Payer: Self-pay | Admitting: Obstetrics and Gynecology

## 2012-12-17 ENCOUNTER — Emergency Department (HOSPITAL_COMMUNITY)
Admission: EM | Admit: 2012-12-17 | Discharge: 2012-12-17 | Disposition: A | Discharge status: Home / Self Care | Payer: Medicaid Other | Attending: Emergency Medicine | Admitting: Emergency Medicine

## 2012-12-17 ENCOUNTER — Encounter (HOSPITAL_COMMUNITY): Payer: Self-pay | Admitting: *Deleted

## 2012-12-17 DIAGNOSIS — R51 Headache: Secondary | ICD-10-CM | POA: Insufficient documentation

## 2012-12-17 DIAGNOSIS — Z8742 Personal history of other diseases of the female genital tract: Secondary | ICD-10-CM | POA: Insufficient documentation

## 2012-12-17 DIAGNOSIS — Z8719 Personal history of other diseases of the digestive system: Secondary | ICD-10-CM | POA: Insufficient documentation

## 2012-12-17 DIAGNOSIS — F1120 Opioid dependence, uncomplicated: Secondary | ICD-10-CM | POA: Insufficient documentation

## 2012-12-17 DIAGNOSIS — I10 Essential (primary) hypertension: Secondary | ICD-10-CM | POA: Insufficient documentation

## 2012-12-17 DIAGNOSIS — J45909 Unspecified asthma, uncomplicated: Secondary | ICD-10-CM | POA: Insufficient documentation

## 2012-12-17 DIAGNOSIS — Z8659 Personal history of other mental and behavioral disorders: Secondary | ICD-10-CM | POA: Insufficient documentation

## 2012-12-17 DIAGNOSIS — F17200 Nicotine dependence, unspecified, uncomplicated: Secondary | ICD-10-CM | POA: Insufficient documentation

## 2012-12-17 DIAGNOSIS — Z79899 Other long term (current) drug therapy: Secondary | ICD-10-CM | POA: Insufficient documentation

## 2012-12-17 DIAGNOSIS — Z8619 Personal history of other infectious and parasitic diseases: Secondary | ICD-10-CM | POA: Insufficient documentation

## 2012-12-17 DIAGNOSIS — Z8669 Personal history of other diseases of the nervous system and sense organs: Secondary | ICD-10-CM | POA: Insufficient documentation

## 2012-12-17 MED ORDER — LABETALOL HCL 300 MG PO TABS
600.0000 mg | ORAL_TABLET | Freq: Two times a day (BID) | ORAL | Status: DC
  Administered 2012-12-17: 600 mg via ORAL
  Filled 2012-12-17 (×2): qty 2

## 2012-12-17 MED ORDER — LABETALOL HCL 300 MG PO TABS: 600.0000 mg | ORAL_TABLET | Freq: Two times a day (BID) | ORAL | Status: DC

## 2012-12-17 MED ORDER — ACETAMINOPHEN 325 MG PO TABS
650.0000 mg | ORAL_TABLET | Freq: Once | ORAL | Status: AC
  Administered 2012-12-17: 650 mg via ORAL
  Filled 2012-12-17: qty 2

## 2012-12-17 NOTE — ED Provider Notes (Signed)
History    CSN: [id number] Arrival date & time 12/17/12  1032 First MD Initiated Contact with Patient 12/17/12 1045      Chief Complaint  Patient presents with  . Hypertension   HPI Pt ran out of her blood pressure medications, three days ago.  Since that time she has been having trouble with a moderate headache. She is not having any difficulty with nausea, vomiting, numbness or weakness. No fevers or chills. No chest pain or shortness of breath. No leg swelling.  Patient takes labetalol 600 mg twice daily. She is in the process of establishing care with a primary care Dr. Patient comes to the ED requesting a refill of her blood pressure medications. Past Medical History  Diagnosis Date  . Anxiety   . GERD (gastroesophageal reflux disease)   . STD (female)     HX of STD's  . Abnormal Pap smear 1998    had colpo  . 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 Bicornuate uterus   . Hypertension 2010  . Bicornate uterus   . Headache   . Chlamydia 2000  . IUP (intrauterine pregnancy), incidental 13 weeks  . PONV (postoperative nausea and vomiting)   . Asthma     ,as child only   no inhaler  . HCV infection     HX of Hep C    Past Surgical History  Procedure Date  . Therapeutic abortion   . Therapeutic abortion   . Tonsillectomy   . Dilation and curettage of uterus     SAB  . Finger surgery 2003    reattachment of right forefinger  . Cervical cerclage 09/01/2011    Procedure: CERCLAGE CERVICAL;  Surgeon: Reva Bores, MD;  Location: [facility];  Service: Gynecology;  Laterality: N/A;  . Cervical cerclage 06/28/2012    Procedure: CERCLAGE CERVICAL;  Surgeon: Reva Bores, MD;  Location: [facility];  Service: Gynecology;  Laterality: N/A;  . Bilateral salpingectomy 12/06/2012    Procedure: BILATERAL SALPINGECTOMY;  Surgeon: Catalina Antigua, MD;  Location: [facility];  Service: Gynecology;  Laterality: Bilateral;  laparoscopic    Family History  Problem Relation Age of Onset  .  Hypertension Mother   . Thyroid disease Maternal Grandmother   . Diabetes Paternal Grandfather   . Anesthesia problems Neg Hx   . Other Neg Hx     History  Substance Use Topics  . Smoking status: Current Some Day Smoker -- 1.0 packs/day for 20 years    Types: Cigarettes  . Smokeless tobacco: Never Used     Comment: nicotine patch currently  . Alcohol Use: No    OB History    Grav Para Term Preterm Abortions TAB SAB Ect Mult Living   7 4 2 2 3 2 1  0 0 2      Review of Systems  All other systems reviewed and are negative.    Allergies  Vicodin  Home Medications   Current Outpatient Rx  Name  Route  Sig  Dispense  Refill  . DOCUSATE SODIUM 100 MG PO CAPS   Oral   Take 1 capsule (100 mg total) by mouth 2 (two) times daily as needed for constipation.   30 capsule   2   . GARCINIA CAMBOGIA-CHROMIUM PO   Oral   Take 2 capsules by mouth daily.         . IBUPROFEN 600 MG PO TABS   Oral   Take 1 tablet (600  mg total) by mouth every 6 (six) hours as needed for pain.   30 tablet   1   . LABETALOL HCL 200 MG PO TABS   Oral   Take 3 tablets (600 mg total) by mouth 2 (two) times daily.   60 tablet   1   . OXYCODONE-ACETAMINOPHEN 5-325 MG PO TABS   Oral   Take 1 tablet by mouth every 4 (four) hours as needed for pain.   20 tablet   0     BP 165/112  Pulse 85  Temp 98.1 F (36.7 C) (Oral)  SpO2 99%  LMP 11/28/2012  Physical Exam  Nursing note and vitals reviewed. Constitutional: She appears well-developed and well-nourished. No distress.  HENT:  Head: Normocephalic and atraumatic.  Right Ear: External ear normal.  Left Ear: External ear normal.  Eyes: Conjunctivae normal are normal. Right eye exhibits no discharge. Left eye exhibits no discharge. No scleral icterus.  Neck: Neck supple. No tracheal deviation present.  Cardiovascular: Normal rate, regular rhythm and intact distal pulses.   Pulmonary/Chest: Effort normal and breath sounds normal. No  stridor. No respiratory distress. She has no wheezes. She has no rales.  Abdominal: Soft. Bowel sounds are normal. She exhibits no distension. There is no tenderness. There is no rebound and no guarding.  Musculoskeletal: She exhibits no edema and no tenderness.  Neurological: She is alert. She has normal strength. She is not disoriented. No cranial nerve deficit ( no gross defecits noted) or sensory deficit. She exhibits normal muscle tone. She displays no seizure activity. Coordination normal. GCS eye subscore is 4. GCS verbal subscore is 5. GCS motor subscore is 6.  Skin: Skin is warm and dry. No rash noted.  Psychiatric: She has a normal mood and affect.    ED Course  Procedures (including critical care time)  Labs Reviewed - No data to display No results found.   1. Hypertension   2. Headache       MDM  Pt has history of hypertension.  She does not have evidence to suggest end organ ischemia.  Her headache is not severe and she is not having any neurologic symptoms to suggest intracerebral hemorrhage. Will give her a dose of her BP medications and prescribe her labetalol as requested.  Resource guide given to assist her with establishing care with a PCP        Celene Kras, MD 12/17/12 1118

## 2012-12-17 NOTE — ED Notes (Signed)
Ran out of bp medicine 3 days ago, headache and HTN reported

## 2012-12-26 ENCOUNTER — Ambulatory Visit: Payer: Medicaid Other | Admitting: Obstetrics and Gynecology

## 2012-12-31 ENCOUNTER — Encounter (HOSPITAL_COMMUNITY): Payer: Self-pay | Admitting: Emergency Medicine

## 2012-12-31 ENCOUNTER — Emergency Department (HOSPITAL_COMMUNITY)
Admission: EM | Admit: 2012-12-31 | Discharge: 2012-12-31 | Disposition: A | Discharge status: Home / Self Care | Payer: Medicaid Other | Attending: Emergency Medicine | Admitting: Emergency Medicine

## 2012-12-31 DIAGNOSIS — Q513 Bicornate uterus: Secondary | ICD-10-CM | POA: Insufficient documentation

## 2012-12-31 DIAGNOSIS — B9789 Other viral agents as the cause of diseases classified elsewhere: Secondary | ICD-10-CM | POA: Insufficient documentation

## 2012-12-31 DIAGNOSIS — Z8709 Personal history of other diseases of the respiratory system: Secondary | ICD-10-CM | POA: Insufficient documentation

## 2012-12-31 DIAGNOSIS — F17200 Nicotine dependence, unspecified, uncomplicated: Secondary | ICD-10-CM | POA: Insufficient documentation

## 2012-12-31 DIAGNOSIS — R197 Diarrhea, unspecified: Secondary | ICD-10-CM | POA: Insufficient documentation

## 2012-12-31 DIAGNOSIS — Z79899 Other long term (current) drug therapy: Secondary | ICD-10-CM | POA: Insufficient documentation

## 2012-12-31 DIAGNOSIS — B349 Viral infection, unspecified: Secondary | ICD-10-CM

## 2012-12-31 DIAGNOSIS — Z8659 Personal history of other mental and behavioral disorders: Secondary | ICD-10-CM | POA: Insufficient documentation

## 2012-12-31 DIAGNOSIS — Z8719 Personal history of other diseases of the digestive system: Secondary | ICD-10-CM | POA: Insufficient documentation

## 2012-12-31 DIAGNOSIS — Z8742 Personal history of other diseases of the female genital tract: Secondary | ICD-10-CM | POA: Insufficient documentation

## 2012-12-31 DIAGNOSIS — F1121 Opioid dependence, in remission: Secondary | ICD-10-CM | POA: Insufficient documentation

## 2012-12-31 DIAGNOSIS — Z8679 Personal history of other diseases of the circulatory system: Secondary | ICD-10-CM | POA: Insufficient documentation

## 2012-12-31 DIAGNOSIS — Z8619 Personal history of other infectious and parasitic diseases: Secondary | ICD-10-CM | POA: Insufficient documentation

## 2012-12-31 NOTE — ED Notes (Signed)
Pt c/o fever, diarrhea, body aches and sleepiness that began at 0200 this am.  Pt reports fever at 0200.  Pt took Tylenol 650 mg by mouth.  Pt reports feeling worse since early am.

## 2012-12-31 NOTE — ED Provider Notes (Signed)
History     CSN: [id number]  Arrival date & time 12/31/12  1055   First MD Initiated Contact with Patient 12/31/12 1057      Chief Complaint  Patient presents with  . Influenza    (Consider location/radiation/quality/duration/timing/severity/associated sxs/prior treatment) HPI Comments: Patient presents with complaint of body aches, subjective fever, mild rhinorrhea, mild cough, diarrhea that started early this morning. Patient treated herself at home with 650 mg of Tylenol which helped her temperature. She went to work was told to leave when her fever returned. 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 No nausea or vomiting. No ear pain. Diarrhea is nonbloody. Patient has a history of hepatitis C. Onset of symptoms gradual. Course is constant. Nothing makes symptoms worse.  The history is provided by the patient.    Past Medical History  Diagnosis Date  . Anxiety   . GERD (gastroesophageal reflux disease)   . STD (female)     HX of STD's  . Abnormal Pap smear 1998    had colpo  . 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 Bicornuate uterus   . Hypertension 2010  . Bicornate uterus   . Headache   . Chlamydia 2000  . IUP (intrauterine pregnancy), incidental 13 weeks  . PONV (postoperative nausea and vomiting)   . Asthma     ,as child only   no inhaler  . HCV infection     HX of Hep C    Past Surgical History  Procedure Laterality Date  . Therapeutic abortion    . Therapeutic abortion    . Tonsillectomy    . Dilation and curettage of uterus      SAB  . Finger surgery  2003    reattachment of right forefinger  . Cervical cerclage  09/01/2011    Procedure: CERCLAGE CERVICAL;  Surgeon: Reva Bores, MD;  Location: [facility];  Service: Gynecology;  Laterality: N/A;  . Cervical cerclage  06/28/2012    Procedure: CERCLAGE CERVICAL;  Surgeon: Reva Bores, MD;  Location: [facility];  Service: Gynecology;  Laterality: N/A;  . Bilateral  salpingectomy  12/06/2012    Procedure: BILATERAL SALPINGECTOMY;  Surgeon: Catalina Antigua, MD;  Location: [facility];  Service: Gynecology;  Laterality: Bilateral;  laparoscopic    Family History  Problem Relation Age of Onset  . Hypertension Mother   . Thyroid disease Maternal Grandmother   . Diabetes Paternal Grandfather   . Anesthesia problems Neg Hx   . Other Neg Hx     History  Substance Use Topics  . Smoking status: Current Some Day Smoker -- 1.00 packs/day for 20 years    Types: Cigarettes  . Smokeless tobacco: Never Used     Comment: nicotine patch currently  . Alcohol Use: No    OB History   Grav Para Term Preterm Abortions TAB SAB Ect Mult Living   7 4 2 2 3 2 1  0 0 2      Review of Systems  Constitutional: Positive for fever and fatigue. Negative for chills.  HENT: Positive for rhinorrhea. Negative for ear pain, congestion, sore throat, neck stiffness and sinus pressure.   Eyes: Negative for redness.  Respiratory: Negative for cough and wheezing.   Gastrointestinal: Positive for diarrhea. Negative for nausea, vomiting, abdominal pain and blood in stool.  Genitourinary: Negative for dysuria.  Musculoskeletal: Positive for myalgias.  Skin: Negative for rash.  Neurological: Negative for  headaches.  Hematological: Negative for adenopathy.    Allergies  Vicodin  Home Medications   Current Outpatient Rx  Name  Route  Sig  Dispense  Refill  . GARCINIA CAMBOGIA-CHROMIUM PO   Oral   Take 2 capsules by mouth daily.         Marland Kitchen ibuprofen (ADVIL,MOTRIN) 200 MG tablet   Oral   Take 800 mg by mouth every 6 (six) hours as needed. For pain         . labetalol (NORMODYNE) 300 MG tablet   Oral   Take 2 tablets (600 mg total) by mouth 2 (two) times daily.   120 tablet   1     BP 150/106  Pulse 79  Temp(Src) 98.6 F (37 C) (Oral)  Ht 5\' 2"  (1.575 m)  Wt 153 lb (69.4 kg)  BMI 27.98 kg/m2  SpO2 100%  LMP 11/28/2012  Physical Exam  Nursing note and vitals  reviewed. Constitutional: She appears well-developed and well-nourished.  HENT:  Head: Normocephalic and atraumatic. No trismus in the jaw.  Right Ear: Tympanic membrane, external ear and ear canal normal.  Left Ear: Tympanic membrane, external ear and ear canal normal.  Nose: Nose normal. No mucosal edema or rhinorrhea.  Mouth/Throat: Uvula is midline, oropharynx is clear and moist and mucous membranes are normal. Mucous membranes are not dry. No oral lesions. No edematous. No oropharyngeal exudate, posterior oropharyngeal edema, posterior oropharyngeal erythema or tonsillar abscesses.  Eyes: Conjunctivae are normal. Right eye exhibits no discharge. Left eye exhibits no discharge.  Neck: Normal range of motion. Neck supple.  Cardiovascular: Normal rate, regular rhythm and normal heart sounds.   Pulmonary/Chest: Effort normal and breath sounds normal. No respiratory distress. She has no wheezes. She has no rales.  Abdominal: Soft. There is no tenderness.  Lymphadenopathy:    She has no cervical adenopathy.  Neurological: She is alert.  Skin: Skin is warm and dry.  Psychiatric: She has a normal mood and affect.    ED Course  Procedures (including critical care time)  Labs Reviewed - No data to display No results found.   1. Viral infection     11:31 AM Patient seen and examined.   Vital signs reviewed and are as follows: Filed Vitals:   12/31/12 1113  BP: 150/106  Pulse: 79  Temp: 98.6 F (37 C)   Patient counseled on supportive care for viral URI and s/s to return including worsening symptoms, persistent fever, persistent vomiting, or if they have any other concerns.  Urged to see PCP if symptoms persist for more than 3 days. Patient verbalizes understanding and agrees with plan.     MDM  Patient with symptoms consistent with a viral syndrome.  Vitals are stable, no fever.  No signs of dehydration.  Lung exam normal, no signs of pneumonia.  Supportive therapy indicated  with return if symptoms worsen.           Renne Crigler, [place] 12/31/12 1151

## 2012-12-31 NOTE — ED Provider Notes (Signed)
Medical screening examination/treatment/procedure(s) were performed by non-physician practitioner and as supervising physician I was immediately available for consultation/collaboration.  Doug Sou, MD 12/31/12 [phone]

## 2013-01-24 ENCOUNTER — Emergency Department (HOSPITAL_COMMUNITY)
Admission: EM | Admit: 2013-01-24 | Discharge: 2013-01-24 | Disposition: A | Discharge status: Home / Self Care | Payer: Medicaid Other | Attending: Emergency Medicine | Admitting: Emergency Medicine

## 2013-01-24 ENCOUNTER — Encounter (HOSPITAL_COMMUNITY): Payer: Self-pay | Admitting: *Deleted

## 2013-01-24 DIAGNOSIS — Z8619 Personal history of other infectious and parasitic diseases: Secondary | ICD-10-CM | POA: Insufficient documentation

## 2013-01-24 DIAGNOSIS — J45909 Unspecified asthma, uncomplicated: Secondary | ICD-10-CM | POA: Insufficient documentation

## 2013-01-24 DIAGNOSIS — K0889 Other specified disorders of teeth and supporting structures: Secondary | ICD-10-CM

## 2013-01-24 DIAGNOSIS — O219 Vomiting of pregnancy, unspecified: Secondary | ICD-10-CM | POA: Insufficient documentation

## 2013-01-24 DIAGNOSIS — Z8719 Personal history of other diseases of the digestive system: Secondary | ICD-10-CM | POA: Insufficient documentation

## 2013-01-24 DIAGNOSIS — R51 Headache: Secondary | ICD-10-CM | POA: Insufficient documentation

## 2013-01-24 DIAGNOSIS — K029 Dental caries, unspecified: Secondary | ICD-10-CM | POA: Insufficient documentation

## 2013-01-24 DIAGNOSIS — H9209 Otalgia, unspecified ear: Secondary | ICD-10-CM | POA: Insufficient documentation

## 2013-01-24 DIAGNOSIS — X58XXXA Exposure to other specified factors, initial encounter: Secondary | ICD-10-CM | POA: Insufficient documentation

## 2013-01-24 DIAGNOSIS — Z8659 Personal history of other mental and behavioral disorders: Secondary | ICD-10-CM | POA: Insufficient documentation

## 2013-01-24 DIAGNOSIS — Z8742 Personal history of other diseases of the female genital tract: Secondary | ICD-10-CM | POA: Insufficient documentation

## 2013-01-24 DIAGNOSIS — O9989 Other specified diseases and conditions complicating pregnancy, childbirth and the puerperium: Secondary | ICD-10-CM | POA: Insufficient documentation

## 2013-01-24 DIAGNOSIS — M542 Cervicalgia: Secondary | ICD-10-CM | POA: Insufficient documentation

## 2013-01-24 DIAGNOSIS — S025XXA Fracture of tooth (traumatic), initial encounter for closed fracture: Secondary | ICD-10-CM | POA: Insufficient documentation

## 2013-01-24 DIAGNOSIS — O99330 Smoking (tobacco) complicating pregnancy, unspecified trimester: Secondary | ICD-10-CM | POA: Insufficient documentation

## 2013-01-24 DIAGNOSIS — I10 Essential (primary) hypertension: Secondary | ICD-10-CM | POA: Insufficient documentation

## 2013-01-24 DIAGNOSIS — Y9389 Activity, other specified: Secondary | ICD-10-CM | POA: Insufficient documentation

## 2013-01-24 DIAGNOSIS — Y929 Unspecified place or not applicable: Secondary | ICD-10-CM | POA: Insufficient documentation

## 2013-01-24 MED ORDER — PENICILLIN V POTASSIUM 500 MG PO TABS: 500.0000 mg | ORAL_TABLET | Freq: Four times a day (QID) | ORAL | Status: DC

## 2013-01-24 MED ORDER — METRONIDAZOLE 500 MG PO TABS: 500.0000 mg | ORAL_TABLET | Freq: Two times a day (BID) | ORAL | Status: DC

## 2013-01-24 MED ORDER — TRAMADOL HCL 50 MG PO TABS: 50.0000 mg | ORAL_TABLET | Freq: Four times a day (QID) | ORAL | Status: DC | PRN

## 2013-01-24 MED ORDER — IBUPROFEN 800 MG PO TABS
800.0000 mg | ORAL_TABLET | Freq: Once | ORAL | Status: AC
  Administered 2013-01-24: 800 mg via ORAL
  Filled 2013-01-24: qty 1

## 2013-01-24 NOTE — ED Provider Notes (Signed)
Medical screening examination/treatment/procedure(s) were performed by non-physician practitioner and as supervising physician I was immediately available for consultation/collaboration.   Flint Melter, MD 01/24/13 [phone]

## 2013-01-24 NOTE — ED Provider Notes (Signed)
History  This chart was scribed for non-physician practitioner working with Flint Melter, MD by Ardeen Jourdain, ED Scribe. This patient was seen in room WTR9/WTR9 and the patient's care was started at 1524.  CSN: [id number]  Arrival date & time 01/24/13  1411   First MD Initiated Contact with Patient 01/24/13 1524      Chief Complaint  Patient presents with  . Dental Pain    Patient is a 37 y.o. female presenting with tooth pain. The history is provided by the patient. No language interpreter was used.  Dental PainThe primary symptoms include dental injury and headaches. Primary symptoms do not include mouth pain, oral bleeding, oral lesions, fever, shortness of breath, sore throat, angioedema or cough. The symptoms began 12 to 24 hours ago. The symptoms are worsening. The symptoms are new. The symptoms occur constantly.  The dental injury occurred 12 - 24 hours ago. The injury is a chip. Mechanism of dental injury: Chewing gum.  The headache began yesterday. Headache is a new problem. The headache is present frequently. Location/region(s) of the headache: frontal and temporal. Associated with: Dental injury. The headache is not associated with visual change or weakness.  Additional symptoms include: taste disturbance and ear pain. Additional symptoms do not include: trouble swallowing, pain with swallowing and swollen glands.    [name] is a 37 y.o. female who presents to the Emergency Department complaining of right lower jaw pain from a broken tooth with associated bad breath, emesis and "nasty taste" in her mouth. She states the pain radiates to her neck and ear. She states she had 2 episode of emesis last night. She denies any nausea currently. She states she broke her tooth yesterday morning while chewing gum. She states she has a h/o these problems. She describes the taste in her mouth as "nasty" but is unable to specify past that. She is not c/o any other symptoms at this  time. She states she had a URI recently but the symptoms have dissipated.   Past Medical History  Diagnosis Date  . Anxiety   . GERD (gastroesophageal reflux disease)   . STD (female)     HX of STD's  . Abnormal Pap smear 1998    had colpo  . 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 Bicornuate uterus   . Hypertension 2010  . Bicornate uterus   . Headache   . Chlamydia 2000  . IUP (intrauterine pregnancy), incidental 13 weeks  . PONV (postoperative nausea and vomiting)   . Asthma     ,as child only   no inhaler  . HCV infection     HX of Hep C    Past Surgical History  Procedure Laterality Date  . Therapeutic abortion    . Therapeutic abortion    . Tonsillectomy    . Dilation and curettage of uterus      SAB  . Finger surgery  2003    reattachment of right forefinger  . Cervical cerclage  09/01/2011    Procedure: CERCLAGE CERVICAL;  Surgeon: Reva Bores, MD;  Location: [facility];  Service: Gynecology;  Laterality: N/A;  . Cervical cerclage  06/28/2012    Procedure: CERCLAGE CERVICAL;  Surgeon: Reva Bores, MD;  Location: [facility];  Service: Gynecology;  Laterality: N/A;  . Bilateral salpingectomy  12/06/2012    Procedure: BILATERAL SALPINGECTOMY;  Surgeon: Catalina Antigua, MD;  Location: [facility];  Service: Gynecology;  Laterality: Bilateral;  laparoscopic  Family History  Problem Relation Age of Onset  . Hypertension Mother   . Thyroid disease Maternal Grandmother   . Diabetes Paternal Grandfather   . Anesthesia problems Neg Hx   . Other Neg Hx     History  Substance Use Topics  . Smoking status: Current Every Day Smoker -- 1.00 packs/day for 20 years    Types: Cigarettes  . Smokeless tobacco: Never Used     Comment: nicotine patch currently  . Alcohol Use: No    OB History   Grav Para Term Preterm Abortions TAB SAB Ect Mult Living   7 4 2 2 3 2 1  0 0 2      Review of Systems  Constitutional: Negative for fever.  HENT: Positive for ear pain, neck  pain and dental problem. Negative for sore throat and trouble swallowing.   Respiratory: Negative for cough, choking and shortness of breath.   Gastrointestinal: Positive for vomiting. Negative for nausea and diarrhea.  Neurological: Positive for headaches. Negative for weakness.  All other systems reviewed and are negative.    Allergies  Vicodin  Home Medications   Current Outpatient Rx  Name  Route  Sig  Dispense  Refill  . acetaminophen (TYLENOL) 500 MG tablet   Oral   Take 500 mg by mouth once.         Marland Kitchen GARCINIA CAMBOGIA-CHROMIUM PO   Oral   Take 2 capsules by mouth daily.         Marland Kitchen labetalol (NORMODYNE) 300 MG tablet   Oral   Take 600 mg by mouth 2 (two) times daily.           Triage Vitals: BP 177/120  Pulse 86  Temp(Src) 98.7 F (37.1 C) (Oral)  SpO2 96%  Physical Exam  Nursing note and vitals reviewed. Constitutional: She is oriented to person, place, and time. She appears well-developed and well-nourished. No distress.  HENT:  Head: Normocephalic and atraumatic. No trismus in the jaw.  Mouth/Throat: Uvula is midline, oropharynx is clear and moist and mucous membranes are normal. Abnormal dentition. Dental caries present. No dental abscesses or edematous. No oropharyngeal exudate.    Tenderness with palpation of gum under missing tooth No tenderness with palpation under tongue   Eyes: EOM are normal. Pupils are equal, round, and reactive to light.  Neck: Normal range of motion. Neck supple. No tracheal deviation present.  Cardiovascular: Normal rate, regular rhythm and normal heart sounds.  Exam reveals no gallop and no friction rub.   No murmur heard. Pulmonary/Chest: Effort normal and breath sounds normal. No respiratory distress. She has no wheezes. She has no rales.  Abdominal: Soft. She exhibits no distension.  Musculoskeletal: Normal range of motion. She exhibits no edema.  Neurological: She is alert and oriented to person, place, and time.    Skin: Skin is warm and dry. She is not diaphoretic.  Psychiatric: She has a normal mood and affect. Her behavior is normal.    ED Course  Procedures (including critical care time)  DIAGNOSTIC STUDIES: Oxygen Saturation is 96% on room air, adequate by my interpretation.    COORDINATION OF CARE:  3:40 PM: Discussed treatment plan which includes antibiotics and follow up with a dentist with pt at bedside and pt agreed to plan.    Labs Reviewed - No data to display No results found.   1. Pain, dental       MDM  Patient stable. No pain under tongue. No concern for Ludwig's  angina. No occult signs of infection or abscess. Covered with abx. Tooth obviously missing around filling. Dental referral given. Tramadol given for pain.   Discharge Medication List as of 01/24/2013  4:02 PM    START taking these medications   Details  metroNIDAZOLE (FLAGYL) 500 MG tablet Take 1 tablet (500 mg total) by mouth 2 (two) times daily. One po bid x 7 days, Starting 01/24/2013, Until Discontinued, Print    penicillin v potassium (VEETID) 500 MG tablet Take 1 tablet (500 mg total) by mouth 4 (four) times daily., Starting 01/24/2013, Until Discontinued, Print         I personally performed the services described in this documentation, which was scribed in my presence. The recorded information has been reviewed and is accurate.     Mora Bellman, PA-C 01/24/13 1644

## 2013-01-24 NOTE — ED Notes (Signed)
Pt reports broken tooth last night. C/o pain to R lower jaw.

## 2013-03-23 ENCOUNTER — Emergency Department (HOSPITAL_COMMUNITY)
Admission: EM | Admit: 2013-03-23 | Discharge: 2013-03-23 | Disposition: A | Discharge status: Home / Self Care | Payer: Self-pay | Attending: Emergency Medicine | Admitting: Emergency Medicine

## 2013-03-23 ENCOUNTER — Encounter (HOSPITAL_COMMUNITY): Payer: Self-pay | Admitting: *Deleted

## 2013-03-23 DIAGNOSIS — Z8619 Personal history of other infectious and parasitic diseases: Secondary | ICD-10-CM | POA: Insufficient documentation

## 2013-03-23 DIAGNOSIS — I10 Essential (primary) hypertension: Secondary | ICD-10-CM | POA: Insufficient documentation

## 2013-03-23 DIAGNOSIS — Z79899 Other long term (current) drug therapy: Secondary | ICD-10-CM | POA: Insufficient documentation

## 2013-03-23 DIAGNOSIS — K219 Gastro-esophageal reflux disease without esophagitis: Secondary | ICD-10-CM | POA: Insufficient documentation

## 2013-03-23 DIAGNOSIS — R51 Headache: Secondary | ICD-10-CM | POA: Insufficient documentation

## 2013-03-23 DIAGNOSIS — Z760 Encounter for issue of repeat prescription: Secondary | ICD-10-CM | POA: Insufficient documentation

## 2013-03-23 DIAGNOSIS — F411 Generalized anxiety disorder: Secondary | ICD-10-CM | POA: Insufficient documentation

## 2013-03-23 DIAGNOSIS — Z8742 Personal history of other diseases of the female genital tract: Secondary | ICD-10-CM | POA: Insufficient documentation

## 2013-03-23 DIAGNOSIS — Z8719 Personal history of other diseases of the digestive system: Secondary | ICD-10-CM | POA: Insufficient documentation

## 2013-03-23 DIAGNOSIS — J45909 Unspecified asthma, uncomplicated: Secondary | ICD-10-CM | POA: Insufficient documentation

## 2013-03-23 DIAGNOSIS — F17200 Nicotine dependence, unspecified, uncomplicated: Secondary | ICD-10-CM | POA: Insufficient documentation

## 2013-03-23 MED ORDER — LABETALOL HCL 300 MG PO TABS: ORAL_TABLET | ORAL | Status: DC

## 2013-03-23 NOTE — ED Notes (Signed)
Pt reports running out of her HTN meds and is needing refill.  Pt is also c/o headache.  Denies any other sxs at this time

## 2013-03-23 NOTE — ED Provider Notes (Signed)
History     CSN: [id number]  Arrival date & time 03/23/13  1146   First MD Initiated Contact with Patient 03/23/13 1230      Chief Complaint  Patient presents with  . Headache  . Hypertension    (Consider location/radiation/quality/duration/timing/severity/associated sxs/prior treatment) HPI Comments: [name] is a 37 y/o F with PMHx of HTN, GERD, asthma presenting to the ED requesting refill of blood pressure medication. Patient reported that she started to run low on her blood pressure medication starting last week - has not been using the true amount that she should be, normally takes Labetolol 300 mg 2 tablets PO in the morning and evening, but has been only taking one tablet in the morning and in the evening because low on medication and has issues with health insurance. Patient reported that she does monitor on blood pressure on a daily basis. Reported having a mild headache, mainly in the frontal region with radiation to the neck, described as a throbbing sensation - reported discomfort to be mild. Patient stated that she has not eaten at all today which is also aiding to her headache. Denied gi symptoms, urinary symptoms, chest pain, shortness of breathe, difficulty breathing, blurred vision, visual distortions, neck pain, back pain, loss of sensation, weakness, fatigue.   The history is provided by the patient. No language interpreter was used.    Past Medical History  Diagnosis Date  . Anxiety   . GERD (gastroesophageal reflux disease)   . STD (female)     HX of STD's  . Abnormal Pap smear 1998    had colpo  . 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 Bicornuate uterus   . Hypertension 2010  . Bicornate uterus   . Headache   . Chlamydia 2000  . IUP (intrauterine pregnancy), incidental 13 weeks  . PONV (postoperative nausea and vomiting)   . Asthma     ,as child only   no inhaler  . HCV infection     HX of Hep C    Past Surgical History   Procedure Laterality Date  . Therapeutic abortion    . Therapeutic abortion    . Tonsillectomy    . Dilation and curettage of uterus      SAB  . Finger surgery  2003    reattachment of right forefinger  . Cervical cerclage  09/01/2011    Procedure: CERCLAGE CERVICAL;  Surgeon: Reva Bores, MD;  Location: [facility];  Service: Gynecology;  Laterality: N/A;  . Cervical cerclage  06/28/2012    Procedure: CERCLAGE CERVICAL;  Surgeon: Reva Bores, MD;  Location: [facility];  Service: Gynecology;  Laterality: N/A;  . Bilateral salpingectomy  12/06/2012    Procedure: BILATERAL SALPINGECTOMY;  Surgeon: Catalina Antigua, MD;  Location: [facility];  Service: Gynecology;  Laterality: Bilateral;  laparoscopic    Family History  Problem Relation Age of Onset  . Hypertension Mother   . Thyroid disease Maternal Grandmother   . Diabetes Paternal Grandfather   . Anesthesia problems Neg Hx   . Other Neg Hx     History  Substance Use Topics  . Smoking status: Current Every Day Smoker -- 1.00 packs/day for 20 years    Types: Cigarettes  . Smokeless tobacco: Never Used     Comment: nicotine patch currently  . Alcohol Use: No    OB History   Grav Para Term Preterm Abortions TAB SAB Ect Mult Living   7 4  2 2 3 2 1  0 0 2      Review of Systems  Constitutional: Negative for fever, chills and fatigue.  HENT: Negative for ear pain, facial swelling, neck pain, neck stiffness and tinnitus.   Eyes: Negative for photophobia, pain and visual disturbance.  Respiratory: Negative for cough, chest tightness and shortness of breath.   Neurological: Positive for headaches.  All other systems reviewed and are negative.    Allergies  Vicodin  Home Medications   Current Outpatient Rx  Name  Route  Sig  Dispense  Refill  . acetaminophen (TYLENOL) 500 MG tablet   Oral   Take 500 mg by mouth once.         Marland Kitchen ibuprofen (ADVIL,MOTRIN) 200 MG tablet   Oral   Take 400 mg by mouth every 6 (six) hours as needed  for pain.         Marland Kitchen labetalol (NORMODYNE) 300 MG tablet   Oral   Take 600 mg by mouth 2 (two) times daily.         Marland Kitchen labetalol (NORMODYNE) 300 MG tablet      Take 2 tablets PO every morning and every night   60 tablet   0   . metroNIDAZOLE (FLAGYL) 500 MG tablet   Oral   Take 1 tablet (500 mg total) by mouth 2 (two) times daily. One po bid x 7 days   20 tablet   0   . penicillin v potassium (VEETID) 500 MG tablet   Oral   Take 1 tablet (500 mg total) by mouth 4 (four) times daily.   30 tablet   0     BP 136/107  Pulse 85  Temp(Src) 98.4 F (36.9 C) (Oral)  Resp 18  SpO2 99%  LMP 03/17/2013  Breastfeeding? No  Physical Exam  Nursing note and vitals reviewed. Constitutional: She is oriented to person, place, and time. She appears well-developed and well-nourished. No distress.  HENT:  Head: Normocephalic and atraumatic.  Mouth/Throat: Oropharynx is clear and moist. No oropharyngeal exudate.  Uvula midline, symmetrical elevation  Eyes: Conjunctivae and EOM are normal. Pupils are equal, round, and reactive to light. Right eye exhibits no discharge. Left eye exhibits no discharge.  Neck: Normal range of motion. Neck supple. No tracheal deviation present.  Negative nuchal rigidity Negative lymphadenopathy  Cardiovascular: Normal rate, regular rhythm and normal heart sounds.   Radial pulses 2+ bilaterally Pedal pulses 2+ bilaterally Negative ankle and leg swelling Negative pitting edema  Pulmonary/Chest: Effort normal and breath sounds normal. No respiratory distress. She has no wheezes. She has no rales. She exhibits no tenderness.  Abdominal: Soft. Bowel sounds are normal. She exhibits no distension and no mass. There is no tenderness. There is no rebound and no guarding.  Musculoskeletal: Normal range of motion. She exhibits no edema and no tenderness.  Lymphadenopathy:    She has no cervical adenopathy.  Neurological: She is alert and oriented to person,  place, and time. No cranial nerve deficit. She exhibits normal muscle tone. Coordination normal.  Cranial nerves III-XII grossly intact  Skin: Skin is warm and Edwards. No rash noted. She is not diaphoretic. No erythema.  Psychiatric: She has a normal mood and affect. Her behavior is normal. Thought content normal.    ED Course  Procedures (including critical care time)  Labs Reviewed - No data to display No results found.   1. HTN (hypertension)   2. Headache   3. Asthma   4.  GERD (gastroesophageal reflux disease)       MDM  I personally evaluated and examined the patient. Patient afebrile, non-tachycardic, alert and oriented. Blood pressure elevated - mildly, patient reported that this is a good blood pressure, stated that she can sometimes be 230's/130's. Patient in ED requesting refill of blood pressure medications. Patient reported that headache has diminished and is okay. Physical exam unremarkable, no neurological deficits noted - patient calm and cooperative. Patient aseptic, non-toxic appearing, in no acute distress. Discharged patient. Discussed with patient to take medications as prescribed, to monitor blood pressure on a daily basis, to decrease salt intake. Discussed with patient to follow-up with Urgent Care Clinic regarding blood pressure re-check and for prescriptions. Discussed with patient to follow-up with [facility] - has not seen a physician since surgery of bilateral salpingectomy performed in January 2014 - discussed importance of following up and being re-checked on a regular basis. Discussed with patient to stay hydrated. Discussed with patient about smoking cessation and therapy sessions that can be used. Discussed with patient to monitor symptoms and if symptoms are to worsen or change to report back to the ED.  Patient agreed to plan of care, understood, all questions answered.         Raymon Mutton, PA-C 03/23/13 2116

## 2013-03-27 NOTE — ED Provider Notes (Signed)
Medical screening examination/treatment/procedure(s) were performed by non-physician practitioner and as supervising physician I was immediately available for consultation/collaboration.   Suzi Roots, MD 03/27/13 0730

## 2013-03-31 ENCOUNTER — Emergency Department (HOSPITAL_COMMUNITY)
Admission: EM | Admit: 2013-03-31 | Discharge: 2013-03-31 | Disposition: A | Discharge status: Home / Self Care | Payer: Self-pay | Attending: Emergency Medicine | Admitting: Emergency Medicine

## 2013-03-31 ENCOUNTER — Other Ambulatory Visit: Payer: Self-pay

## 2013-03-31 ENCOUNTER — Encounter (HOSPITAL_COMMUNITY): Payer: Self-pay

## 2013-03-31 DIAGNOSIS — Z8719 Personal history of other diseases of the digestive system: Secondary | ICD-10-CM | POA: Insufficient documentation

## 2013-03-31 DIAGNOSIS — J45909 Unspecified asthma, uncomplicated: Secondary | ICD-10-CM | POA: Insufficient documentation

## 2013-03-31 DIAGNOSIS — Z8659 Personal history of other mental and behavioral disorders: Secondary | ICD-10-CM | POA: Insufficient documentation

## 2013-03-31 DIAGNOSIS — I10 Essential (primary) hypertension: Secondary | ICD-10-CM | POA: Insufficient documentation

## 2013-03-31 DIAGNOSIS — Z79899 Other long term (current) drug therapy: Secondary | ICD-10-CM | POA: Insufficient documentation

## 2013-03-31 DIAGNOSIS — Z3202 Encounter for pregnancy test, result negative: Secondary | ICD-10-CM | POA: Insufficient documentation

## 2013-03-31 DIAGNOSIS — Z8742 Personal history of other diseases of the female genital tract: Secondary | ICD-10-CM | POA: Insufficient documentation

## 2013-03-31 DIAGNOSIS — R51 Headache: Secondary | ICD-10-CM

## 2013-03-31 DIAGNOSIS — F17200 Nicotine dependence, unspecified, uncomplicated: Secondary | ICD-10-CM | POA: Insufficient documentation

## 2013-03-31 DIAGNOSIS — Z8619 Personal history of other infectious and parasitic diseases: Secondary | ICD-10-CM | POA: Insufficient documentation

## 2013-03-31 DIAGNOSIS — R079 Chest pain, unspecified: Secondary | ICD-10-CM

## 2013-03-31 LAB — BASIC METABOLIC PANEL WITH GFR
BUN: 7 mg/dL (ref 6–23)
CO2: 22 meq/L (ref 19–32)
Calcium: 9.6 mg/dL (ref 8.4–10.5)
Creatinine, Ser: 0.61 mg/dL (ref 0.50–1.10)
GFR calc non Af Amer: >90 mL/min (ref >90)
Glucose, Bld: 147 mg/dL — ABNORMAL HIGH (ref 70–99)
Sodium: 137 meq/L (ref 135–145)

## 2013-03-31 LAB — URINALYSIS, ROUTINE W REFLEX MICROSCOPIC
Bilirubin Urine: NEGATIVE
Glucose, UA: NEGATIVE mg/dL
Hgb urine dipstick: NEGATIVE
Ketones, ur: NEGATIVE mg/dL
Leukocytes, UA: NEGATIVE
Nitrite: NEGATIVE
Protein, ur: NEGATIVE mg/dL
Specific Gravity, Urine: 1.03 (ref 1.005–1.030)
Urobilinogen, UA: 0.2 mg/dL (ref 0.0–1.0)
pH: 5.5 (ref 5.0–8.0)

## 2013-03-31 LAB — CBC
HCT: 35.7 % — ABNORMAL LOW (ref 36.0–46.0)
Hemoglobin: 11.3 g/dL — ABNORMAL LOW (ref 12.0–15.0)
MCH: 24 pg — ABNORMAL LOW (ref 26.0–34.0)
MCHC: 31.7 g/dL (ref 30.0–36.0)
MCV: 75.8 fL — ABNORMAL LOW (ref 78.0–100.0)
Platelets: 241 10*3/uL (ref 150–400)
RBC: 4.71 MIL/uL (ref 3.87–5.11)
RDW: 14 % (ref 11.5–15.5)
WBC: 6.5 10*3/uL (ref 4.0–10.5)

## 2013-03-31 LAB — BASIC METABOLIC PANEL
Chloride: 103 mEq/L (ref 96–112)
GFR calc Af Amer: >90 mL/min (ref >90)
Potassium: 3.7 mEq/L (ref 3.5–5.1)

## 2013-03-31 NOTE — ED Notes (Signed)
MD at bedside. 

## 2013-03-31 NOTE — Discharge Instructions (Signed)
 Chest Pain (Nonspecific) It is often hard to give a specific diagnosis for the cause of chest pain. There is always a chance that your pain could be related to something serious, such as a heart attack or a blood clot in the lungs. You need to follow up with your caregiver for further evaluation. CAUSES   Heartburn.  Pneumonia or bronchitis.  Anxiety or stress.  Inflammation around your heart (pericarditis) or lung (pleuritis or pleurisy).  A blood clot in the lung.  A collapsed lung (pneumothorax). It can develop suddenly on its own (spontaneous pneumothorax) or from injury (trauma) to the chest.  Shingles infection (herpes zoster virus). The chest wall is composed of bones, muscles, and cartilage. Any of these can be the source of the pain.  The bones can be bruised by injury.  The muscles or cartilage can be strained by coughing or overwork.  The cartilage can be affected by inflammation and become sore (costochondritis). DIAGNOSIS  Lab tests or other studies, such as X-rays, electrocardiography, stress testing, or cardiac imaging, may be needed to find the cause of your pain.  TREATMENT   Treatment depends on what may be causing your chest pain. Treatment may include:  Acid blockers for heartburn.  Anti-inflammatory medicine.  Pain medicine for inflammatory conditions.  Antibiotics if an infection is present.  You may be advised to change lifestyle habits. This includes stopping smoking and avoiding alcohol, caffeine, and chocolate.  You may be advised to keep your head raised (elevated) when sleeping. This reduces the chance of acid going backward from your stomach into your esophagus.  Most of the time, nonspecific chest pain will improve within 2 to 3 days with rest and mild pain medicine. HOME CARE INSTRUCTIONS   If antibiotics were prescribed, take your antibiotics as directed. Finish them even if you start to feel better.  For the next few days, avoid physical  activities that bring on chest pain. Continue physical activities as directed.  Do not smoke.  Avoid drinking alcohol.  Only take over-the-counter or prescription medicine for pain, discomfort, or fever as directed by your caregiver.  Follow your caregiver's suggestions for further testing if your chest pain does not go away.  Keep any follow-up appointments you made. If you do not go to an appointment, you could develop lasting (chronic) problems with pain. If there is any problem keeping an appointment, you must call to reschedule. SEEK MEDICAL CARE IF:   You think you are having problems from the medicine you are taking. Read your medicine instructions carefully.  Your chest pain does not go away, even after treatment.  You develop a rash with blisters on your chest. SEEK IMMEDIATE MEDICAL CARE IF:   You have increased chest pain or pain that spreads to your arm, neck, jaw, back, or abdomen.  You develop shortness of breath, an increasing cough, or you are coughing up blood.  You have severe back or abdominal pain, feel nauseous, or vomit.  You develop severe weakness, fainting, or chills.  You have a fever. THIS IS AN EMERGENCY. Do not wait to see if the pain will go away. Get medical help at once. Call your local emergency services (911 in [place] Do not drive yourself to the hospital. MAKE SURE YOU:   Understand these instructions.  Will watch your condition.  Will get help right away if you are not doing well or get worse. Document Released: 08/19/2005 Document Revised: 02/01/2012 Document Reviewed: 06/14/2008 [facility] Patient Information 2013 [employer].    Most likely your symptoms were due to stress, your ECG and blood tests were all normal except for slightly elevated blood sugar and mild anemia.  Please continue taking your current medications and follow up with your primary care provider to discuss changes to your blood pressure medication.

## 2013-03-31 NOTE — ED Provider Notes (Signed)
History     CSN: [id number]  Arrival date & time 03/31/13  0721   First MD Initiated Contact with Patient 03/31/13 [phone]      Chief Complaint  Patient presents with  . Headache  . Chest Pain    (Consider location/radiation/quality/duration/timing/severity/associated sxs/prior treatment) HPI Comments: Patient reports former history of drug abuse, now clean as well as history of hypertension that began with last pregnancy. She has had headaches in the past, takes over-the-counter Tylenol and Motrin. She currently is taking labetalol for her blood pressure which was started by her OB/GYN. She reports her blood pressures difficult to control, came to the [facility] a couple weeks ago and received refills of her labetalol. She was told to followup with [place] urgent [facility] for establishment with primary care physician and to consider change to a more typical for slight antihypertensive. She was on her way to work today, had had a persistent headache, mild, not the worst, no associated rash, fever, stiff neck. She also developed some anterior substernal chest pressure that was nonradiating. She denies any sweats, significant shortness of breath, nausea or vomiting. Currently, the patient reports feeling improved. She was worried that her blood pressure was high and she was afraid she was going to have a stroke and thus drove  herself to the emergency department. She was requesting a work note.  Patient is a 37 y.o. female presenting with headaches and chest pain. The history is provided by the patient.  Headache Associated symptoms: no fever, no nausea, no numbness and no photophobia   Chest Pain Associated symptoms: headache   Associated symptoms: no diaphoresis, no fever, no nausea, no numbness, no shortness of breath and no weakness     Past Medical History  Diagnosis Date  . Anxiety   . GERD (gastroesophageal reflux disease)   . STD (female)     HX of STD's  . Abnormal Pap smear 1998     had colpo  . 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 Bicornuate uterus   . Hypertension 2010  . Bicornate uterus   . Headache   . Chlamydia 2000  . IUP (intrauterine pregnancy), incidental 13 weeks  . PONV (postoperative nausea and vomiting)   . Asthma     ,as child only   no inhaler  . HCV infection     HX of Hep C    Past Surgical History  Procedure Laterality Date  . Therapeutic abortion    . Therapeutic abortion    . Tonsillectomy    . Dilation and curettage of uterus      SAB  . Finger surgery  2003    reattachment of right forefinger  . Cervical cerclage  09/01/2011    Procedure: CERCLAGE CERVICAL;  Surgeon: Reva Bores, MD;  Location: [facility];  Service: Gynecology;  Laterality: N/A;  . Cervical cerclage  06/28/2012    Procedure: CERCLAGE CERVICAL;  Surgeon: Reva Bores, MD;  Location: [facility];  Service: Gynecology;  Laterality: N/A;  . Bilateral salpingectomy  12/06/2012    Procedure: BILATERAL SALPINGECTOMY;  Surgeon: Catalina Antigua, MD;  Location: [facility];  Service: Gynecology;  Laterality: Bilateral;  laparoscopic    Family History  Problem Relation Age of Onset  . Hypertension Mother   . Thyroid disease Maternal Grandmother   . Diabetes Paternal Grandfather   . Anesthesia problems Neg Hx   . Other Neg Hx     History  Substance Use Topics  .  Smoking status: Current Every Day Smoker -- 1.00 packs/day for 20 years    Types: Cigarettes  . Smokeless tobacco: Never Used     Comment: nicotine patch currently  . Alcohol Use: No    OB History   Grav Para Term Preterm Abortions TAB SAB Ect Mult Living   7 4 2 2 3 2 1  0 0 2      Review of Systems  Constitutional: Negative for fever and diaphoresis.  Eyes: Negative for photophobia and visual disturbance.  Respiratory: Negative for shortness of breath.   Cardiovascular: Positive for chest pain.  Gastrointestinal: Negative for nausea.  Neurological: Positive for headaches. Negative for syncope,  speech difficulty, weakness, light-headedness and numbness.  All other systems reviewed and are negative.    Allergies  Vicodin  Home Medications   Current Outpatient Rx  Name  Route  Sig  Dispense  Refill  . acetaminophen (TYLENOL) 500 MG tablet   Oral   Take 500 mg by mouth once.         Marland Kitchen ibuprofen (ADVIL,MOTRIN) 200 MG tablet   Oral   Take 400 mg by mouth every 6 (six) hours as needed for pain.         Marland Kitchen labetalol (NORMODYNE) 300 MG tablet   Oral   Take 600 mg by mouth 2 (two) times daily.         Marland Kitchen labetalol (NORMODYNE) 300 MG tablet      Take 2 tablets PO every morning and every night   60 tablet   0   . metroNIDAZOLE (FLAGYL) 500 MG tablet   Oral   Take 1 tablet (500 mg total) by mouth 2 (two) times daily. One po bid x 7 days   20 tablet   0   . penicillin v potassium (VEETID) 500 MG tablet   Oral   Take 1 tablet (500 mg total) by mouth 4 (four) times daily.   30 tablet   0     BP 119/94  Pulse 79  Temp(Src) 98 F (36.7 C) (Oral)  Resp 12  Ht 5\' 2"  (1.575 m)  Wt 167 lb (75.751 kg)  BMI 30.54 kg/m2  SpO2 97%  LMP 03/17/2013  Physical Exam  Nursing note and vitals reviewed. Constitutional: She is oriented to person, place, and time. She appears well-developed and well-nourished. No distress.  HENT:  Head: Normocephalic and atraumatic.  Eyes: EOM are normal. No scleral icterus.  Neck: Normal range of motion. Neck supple.  Cardiovascular: Normal rate and regular rhythm.   No murmur heard. Pulmonary/Chest: Effort normal and breath sounds normal. No respiratory distress. She has no wheezes.  Abdominal: Soft. She exhibits no distension. There is no tenderness.  Musculoskeletal: She exhibits no edema and no tenderness.  Neurological: She is alert and oriented to person, place, and time. She exhibits normal muscle tone. Coordination normal.  Skin: Skin is warm and dry. No rash noted. She is not diaphoretic.  Psychiatric: She has a normal mood  and affect.    ED Course  Procedures (including critical care time)  Labs Reviewed  CBC - Abnormal; Notable for the following:    Hemoglobin 11.3 (*)    HCT 35.7 (*)    MCV 75.8 (*)    MCH 24.0 (*)    All other components within normal limits  BASIC METABOLIC PANEL - Abnormal; Notable for the following:    Glucose, Bld 147 (*)    All other components within normal limits  URINALYSIS,  ROUTINE W REFLEX MICROSCOPIC - Abnormal; Notable for the following:    APPearance CLOUDY (*)    All other components within normal limits  POCT PREGNANCY, URINE  POCT I-STAT TROPONIN I   No results found.   1. Headache   2. Chest pain     Room air saturation is 97% I interpret to be normal  EKG at time 07:30, shows normal sinus rhythm at a rate of 79, normal axis. No ST or T-wave abnormalities. Normal intervals. Compared to EKG from 01/17/2012, RSR prime is no longer seen in lead V1 and V2. Otherwise no significant changes. No ischemia is noted.  MDM   Patient with no stroke symptoms here, normal neurologic examination. Blood pressure is 119/94. I recommend that the patient remain on her current antihypertensive and to establish and followup with her urgent care. She reports she was going to go there today but due to her symptoms decided to come here. EKG shows no ischemia, troponin and other blood tests are essentially unremarkable. Patient is reassured and she voices improvement of all of her symptoms.        Gavin Pound. Oletta Lamas, MD 03/31/13 1610

## 2013-03-31 NOTE — ED Notes (Signed)
Pt presents with a headache that started yesterday. Pt has sensitivity to light with this headache. Chest pain is generalized and started about 30 minutes ago. Pt has a hx of HTN and thinks that when her blood pressure is elevated her chest starts hurting. Pt was supposed to follow up with Urgent Care today r/t her HTN but they don't open until ten and she was worried about her blood pressure.

## 2013-06-22 ENCOUNTER — Emergency Department (HOSPITAL_COMMUNITY)
Admission: EM | Admit: 2013-06-22 | Discharge: 2013-06-22 | Disposition: A | Discharge status: Home / Self Care | Payer: Self-pay | Attending: Emergency Medicine | Admitting: Emergency Medicine

## 2013-06-22 ENCOUNTER — Encounter (HOSPITAL_COMMUNITY): Payer: Self-pay | Admitting: Emergency Medicine

## 2013-06-22 ENCOUNTER — Emergency Department (HOSPITAL_COMMUNITY): Payer: Self-pay

## 2013-06-22 DIAGNOSIS — I10 Essential (primary) hypertension: Secondary | ICD-10-CM

## 2013-06-22 DIAGNOSIS — Z8709 Personal history of other diseases of the respiratory system: Secondary | ICD-10-CM | POA: Insufficient documentation

## 2013-06-22 DIAGNOSIS — Z8659 Personal history of other mental and behavioral disorders: Secondary | ICD-10-CM | POA: Insufficient documentation

## 2013-06-22 DIAGNOSIS — F17200 Nicotine dependence, unspecified, uncomplicated: Secondary | ICD-10-CM | POA: Insufficient documentation

## 2013-06-22 DIAGNOSIS — F1110 Opioid abuse, uncomplicated: Secondary | ICD-10-CM | POA: Insufficient documentation

## 2013-06-22 DIAGNOSIS — R000 Tachycardia, unspecified: Secondary | ICD-10-CM | POA: Insufficient documentation

## 2013-06-22 DIAGNOSIS — Z8742 Personal history of other diseases of the female genital tract: Secondary | ICD-10-CM | POA: Insufficient documentation

## 2013-06-22 DIAGNOSIS — Z8619 Personal history of other infectious and parasitic diseases: Secondary | ICD-10-CM | POA: Insufficient documentation

## 2013-06-22 DIAGNOSIS — Z8719 Personal history of other diseases of the digestive system: Secondary | ICD-10-CM | POA: Insufficient documentation

## 2013-06-22 DIAGNOSIS — R079 Chest pain, unspecified: Secondary | ICD-10-CM | POA: Insufficient documentation

## 2013-06-22 DIAGNOSIS — R51 Headache: Secondary | ICD-10-CM | POA: Insufficient documentation

## 2013-06-22 LAB — CBC WITH DIFFERENTIAL/PLATELET
Basophils Relative: 0 % (ref 0–1)
Eosinophils Absolute: 0.1 10*3/uL (ref 0.0–0.7)
HCT: 43.5 % (ref 36.0–46.0)
Hemoglobin: 15.6 g/dL — ABNORMAL HIGH (ref 12.0–15.0)
Lymphs Abs: 2.7 10*3/uL (ref 0.7–4.0)
MCH: 27 pg (ref 26.0–34.0)
MCHC: 35.9 g/dL (ref 30.0–36.0)
Monocytes Absolute: 0.8 10*3/uL (ref 0.1–1.0)
Monocytes Relative: 8 % (ref 3–12)
Neutro Abs: 6.6 10*3/uL (ref 1.7–7.7)
RBC: 5.78 MIL/uL — ABNORMAL HIGH (ref 3.87–5.11)

## 2013-06-22 LAB — BASIC METABOLIC PANEL
BUN: 10 mg/dL (ref 6–23)
Chloride: 98 mEq/L (ref 96–112)
Creatinine, Ser: 0.61 mg/dL (ref 0.50–1.10)
GFR calc Af Amer: >90 mL/min (ref >90)
Glucose, Bld: 89 mg/dL (ref 70–99)
Potassium: 4 mEq/L (ref 3.5–5.1)

## 2013-06-22 LAB — TROPONIN I: Troponin I: <0.3 ng/mL (ref <0.30)

## 2013-06-22 MED ORDER — ONDANSETRON HCL 4 MG/2ML IJ SOLN
4.0000 mg | Freq: Once | INTRAMUSCULAR | Status: AC
  Administered 2013-06-22: 4 mg via INTRAVENOUS
  Filled 2013-06-22: qty 2

## 2013-06-22 MED ORDER — SODIUM CHLORIDE 0.9 % IV BOLUS (SEPSIS)
1000.0000 mL | Freq: Once | INTRAVENOUS | Status: AC
  Administered 2013-06-22: 1000 mL via INTRAVENOUS

## 2013-06-22 MED ORDER — KETOROLAC TROMETHAMINE 30 MG/ML IJ SOLN
30.0000 mg | Freq: Once | INTRAMUSCULAR | Status: AC
  Administered 2013-06-22: 30 mg via INTRAVENOUS
  Filled 2013-06-22: qty 1

## 2013-06-22 MED ORDER — ASPIRIN 81 MG PO CHEW
324.0000 mg | CHEWABLE_TABLET | Freq: Once | ORAL | Status: AC
  Administered 2013-06-22: 324 mg via ORAL
  Filled 2013-06-22: qty 4

## 2013-06-22 MED ORDER — METOPROLOL TARTRATE 25 MG PO TABS
50.0000 mg | ORAL_TABLET | Freq: Once | ORAL | Status: AC
  Administered 2013-06-22: 50 mg via ORAL
  Filled 2013-06-22: qty 2

## 2013-06-22 MED ORDER — METOCLOPRAMIDE HCL 5 MG/ML IJ SOLN
10.0000 mg | Freq: Once | INTRAMUSCULAR | Status: AC
  Administered 2013-06-22: 10 mg via INTRAVENOUS
  Filled 2013-06-22: qty 2

## 2013-06-22 MED ORDER — NITROGLYCERIN 0.4 MG SL SUBL
0.4000 mg | SUBLINGUAL_TABLET | SUBLINGUAL | Status: DC | PRN
  Administered 2013-06-22: 0.4 mg via SUBLINGUAL
  Filled 2013-06-22: qty 25

## 2013-06-22 MED ORDER — DIPHENHYDRAMINE HCL 50 MG/ML IJ SOLN
25.0000 mg | Freq: Once | INTRAMUSCULAR | Status: AC
  Administered 2013-06-22: 25 mg via INTRAVENOUS
  Filled 2013-06-22: qty 1

## 2013-06-22 MED ORDER — LABETALOL HCL 100 MG PO TABS: 300.0000 mg | ORAL_TABLET | Freq: Two times a day (BID) | ORAL | Status: DC

## 2013-06-22 NOTE — ED Notes (Signed)
To ED via private vehicle-- has hx of HTN-- was on Labetolol but has run out-- has no private dr-- needs referral and meds.

## 2013-06-22 NOTE — ED Provider Notes (Signed)
CSN: [id number]     Arrival date & time 06/22/13  7829 History     First MD Initiated Contact with Patient 06/22/13 [phone]     Chief Complaint  Patient presents with  . Hypertension  . Headache   (Consider location/radiation/quality/duration/timing/severity/associated sxs/prior Treatment) HPI Comments: Patient presents to the emergency department with chief complaint of hypertension and headache. She states that the headache has been going on for some time. She states that her morning she wakes up with a headache. She states that she has been out of for hypertension medication, and requests a refill. On further questioning, she states that she has had chest pain this morning, which is associated with shortness of breath as well as diaphoresis. The chest pain did not have an exertional component to it. She has not tried taking anything to alleviate her symptoms.  The history is provided by the patient. No language interpreter was used.    Past Medical History  Diagnosis Date  . Anxiety   . GERD (gastroesophageal reflux disease)   . STD (female)     HX of STD's  . Abnormal Pap smear 1998    had colpo  . 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 Bicornuate uterus   . Hypertension 2010  . Bicornate uterus   . Headache(784.0)   . Chlamydia 2000  . IUP (intrauterine pregnancy), incidental 13 weeks  . PONV (postoperative nausea and vomiting)   . Asthma     ,as child only   no inhaler  . HCV infection     HX of Hep C   Past Surgical History  Procedure Laterality Date  . Therapeutic abortion    . Therapeutic abortion    . Tonsillectomy    . Dilation and curettage of uterus      SAB  . Finger surgery  2003    reattachment of right forefinger  . Cervical cerclage  09/01/2011    Procedure: CERCLAGE CERVICAL;  Surgeon: Reva Bores, MD;  Location: [facility];  Service: Gynecology;  Laterality: N/A;  . Cervical cerclage  06/28/2012    Procedure: CERCLAGE CERVICAL;  Surgeon: Reva Bores, MD;  Location: [facility];  Service: Gynecology;  Laterality: N/A;  . Bilateral salpingectomy  12/06/2012    Procedure: BILATERAL SALPINGECTOMY;  Surgeon: Catalina Antigua, MD;  Location: [facility];  Service: Gynecology;  Laterality: Bilateral;  laparoscopic   Family History  Problem Relation Age of Onset  . Hypertension Mother   . Thyroid disease Maternal Grandmother   . Diabetes Paternal Grandfather   . Anesthesia problems Neg Hx   . Other Neg Hx    History  Substance Use Topics  . Smoking status: Current Every Day Smoker -- 1.00 packs/day for 20 years    Types: Cigarettes  . Smokeless tobacco: Never Used     Comment: nicotine patch currently  . Alcohol Use: No     Comment: sober since 01/22/11   OB History   Grav Para Term Preterm Abortions TAB SAB Ect Mult Living   7 4 2 2 3 2 1  0 0 2     Review of Systems  All other systems reviewed and are negative.    Allergies  Vicodin  Home Medications   Current Outpatient Rx  Name  Route  Sig  Dispense  Refill  . ibuprofen (ADVIL,MOTRIN) 200 MG tablet   Oral   Take 600 mg by mouth daily.          Marland Kitchen  metroNIDAZOLE (FLAGYL) 500 MG tablet   Oral   Take 1 tablet (500 mg total) by mouth 2 (two) times daily. One po bid x 7 days   20 tablet   0   . penicillin v potassium (VEETID) 500 MG tablet   Oral   Take 1 tablet (500 mg total) by mouth 4 (four) times daily.   30 tablet   0    BP 160/131  Pulse 110  Temp(Src) 98.7 F (37.1 C) (Oral)  Resp 14  SpO2 98%  LMP 06/16/2013 Physical Exam  Nursing note and vitals reviewed. Constitutional: She is oriented to person, place, and time. She appears well-developed and well-nourished.  HENT:  Head: Normocephalic and atraumatic.  Eyes: Conjunctivae and EOM are normal. Pupils are equal, round, and reactive to light.  Neck: Normal range of motion. Neck supple.  Cardiovascular: Regular rhythm.  Exam reveals no gallop and no friction rub.   No murmur heard. Tachycardic   Pulmonary/Chest: Effort normal and breath sounds normal. No respiratory distress. She has no wheezes. She has no rales. She exhibits no tenderness.  Abdominal: Soft. Bowel sounds are normal. She exhibits no distension and no mass. There is no tenderness. There is no rebound and no guarding.  Musculoskeletal: Normal range of motion. She exhibits no edema and no tenderness.  Neurological: She is alert and oriented to person, place, and time.  Skin: Skin is warm and dry.  Psychiatric: She has a normal mood and affect. Her behavior is normal. Judgment and thought content normal.    ED Course   Procedures (including critical care time)  Labs Reviewed  CBC WITH DIFFERENTIAL  BASIC METABOLIC PANEL  TROPONIN I   ED ECG REPORT  I personally interpreted this EKG   Date: 06/22/2013   Rate: 110  Rhythm: sinus tachycardia  QRS Axis: normal  Intervals: normal  ST/T Wave abnormalities: normal  Conduction Disutrbances:none  Narrative Interpretation:   Old EKG Reviewed: none available  Results for orders placed during the hospital encounter of 06/22/13  CBC WITH DIFFERENTIAL      Result Value Range   WBC 10.3  4.0 - 10.5 K/uL   RBC 5.78 (*) 3.87 - 5.11 MIL/uL   Hemoglobin 15.6 (*) 12.0 - 15.0 g/dL   HCT 45.4  09.8 - 11.9 %   MCV 75.3 (*) 78.0 - 100.0 fL   MCH 27.0  26.0 - 34.0 pg   MCHC 35.9  30.0 - 36.0 g/dL   RDW 14.7  82.9 - 56.2 %   Platelets 314  150 - 400 K/uL   Neutrophils Relative % 64  43 - 77 %   Neutro Abs 6.6  1.7 - 7.7 K/uL   Lymphocytes Relative 27  12 - 46 %   Lymphs Abs 2.7  0.7 - 4.0 K/uL   Monocytes Relative 8  3 - 12 %   Monocytes Absolute 0.8  0.1 - 1.0 K/uL   Eosinophils Relative 1  0 - 5 %   Eosinophils Absolute 0.1  0.0 - 0.7 K/uL   Basophils Relative 0  0 - 1 %   Basophils Absolute 0.0  0.0 - 0.1 K/uL  BASIC METABOLIC PANEL      Result Value Range   Sodium 135  135 - 145 mEq/L   Potassium 4.0  3.5 - 5.1 mEq/L   Chloride 98  96 - 112 mEq/L   CO2 24  19 -  32 mEq/L   Glucose, Bld 89  70 - 99  mg/dL   BUN 10  6 - 23 mg/dL   Creatinine, Ser 1.61  0.50 - 1.10 mg/dL   Calcium 09.6  8.4 - 04.5 mg/dL   GFR calc non Af Amer >90  >90 mL/min   GFR calc Af Amer >90  >90 mL/min  TROPONIN I      Result Value Range   Troponin I <0.30  <0.30 ng/mL  D-DIMER, QUANTITATIVE      Result Value Range   D-Dimer, Quant 0.30  0.00 - 0.48 ug/mL-FEU  TROPONIN I      Result Value Range   Troponin I <0.30  <0.30 ng/mL   Dg Chest 1 View  06/22/2013   *RADIOLOGY REPORT*  Clinical Data: Hypertension, headache, shortness of breath.  CHEST - 1 VIEW  Comparison: 01/17/2012  Findings: Heart and mediastinal contours are within normal limits. No focal opacities or effusions.  No acute bony abnormality.  IMPRESSION: No active cardiopulmonary disease.   Original Report Authenticated By: Charlett Nose, M.D.     1. Chest pain   2. Hypertension     MDM  Patient with hypotension, tachycardia, and concerning chest pain this morning. Check basic labs.  12:13 PM Discussed the patient with Dr. Karma Ganja, who reviewed the labs and workup with me.  Will order a second troponin per Dr. Dellie Burns recommendation.  Patient's HR has normalized with fluids.  Patient is still hypertensive, though this is improved as well.  Patient has a TIMI score of 0, but does have HTN as a risk factor.  No other risk factors.  D-dimer is negative.  First troponin is negative.  Filed Vitals:   06/22/13 1104  BP: 137/108  Pulse: 98  Temp: 98.3 F (36.8 C)  Resp: 20   12:30 PM Patient re-evaluated, she states that she still feels a little dizzy.  BP has improved significantly with metoprolol, but is still elevated.  Discussed with Dr. Karma Ganja, who recommends holding additional BP meds at this time, out of concern for taking her too low, too quickly.  Dr. Karma Ganja states that I can refill the patient's prior prescription for labetalol as it was written, and she may begin taking it tomorrow.  2:29 PM Second  troponin is negative. Discharged to home. Will refill the patient's labetalol, as prescribed in history in epic. Recommend PCP followup.  Roxy Horseman, PA-C 06/22/13 1429

## 2013-06-22 NOTE — ED Provider Notes (Signed)
Medical screening examination/treatment/procedure(s) were performed by non-physician practitioner and as supervising physician I was immediately available for consultation/collaboration.  Ethelda Chick, MD 06/22/13 [phone]
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
## 2013-10-07 ENCOUNTER — Encounter (HOSPITAL_COMMUNITY): Payer: Self-pay | Admitting: Emergency Medicine

## 2013-10-07 ENCOUNTER — Emergency Department (HOSPITAL_COMMUNITY)
Admission: EM | Admit: 2013-10-07 | Discharge: 2013-10-07 | Disposition: A | Discharge status: Home / Self Care | Payer: Self-pay | Attending: Emergency Medicine | Admitting: Emergency Medicine

## 2013-10-07 DIAGNOSIS — F411 Generalized anxiety disorder: Secondary | ICD-10-CM | POA: Insufficient documentation

## 2013-10-07 DIAGNOSIS — R109 Unspecified abdominal pain: Secondary | ICD-10-CM | POA: Insufficient documentation

## 2013-10-07 DIAGNOSIS — N760 Acute vaginitis: Secondary | ICD-10-CM | POA: Insufficient documentation

## 2013-10-07 DIAGNOSIS — B1920 Unspecified viral hepatitis C without hepatic coma: Secondary | ICD-10-CM | POA: Insufficient documentation

## 2013-10-07 DIAGNOSIS — F17200 Nicotine dependence, unspecified, uncomplicated: Secondary | ICD-10-CM | POA: Insufficient documentation

## 2013-10-07 DIAGNOSIS — J45909 Unspecified asthma, uncomplicated: Secondary | ICD-10-CM | POA: Insufficient documentation

## 2013-10-07 DIAGNOSIS — Z79899 Other long term (current) drug therapy: Secondary | ICD-10-CM | POA: Insufficient documentation

## 2013-10-07 DIAGNOSIS — I10 Essential (primary) hypertension: Secondary | ICD-10-CM | POA: Insufficient documentation

## 2013-10-07 DIAGNOSIS — Z3202 Encounter for pregnancy test, result negative: Secondary | ICD-10-CM | POA: Insufficient documentation

## 2013-10-07 DIAGNOSIS — B9689 Other specified bacterial agents as the cause of diseases classified elsewhere: Secondary | ICD-10-CM

## 2013-10-07 LAB — URINALYSIS, ROUTINE W REFLEX MICROSCOPIC
Bilirubin Urine: NEGATIVE
Glucose, UA: NEGATIVE mg/dL
Ketones, ur: NEGATIVE mg/dL
Nitrite: NEGATIVE
Protein, ur: 30 mg/dL — AB
Specific Gravity, Urine: 1.024 (ref 1.005–1.030)
Urobilinogen, UA: 0.2 mg/dL (ref 0.0–1.0)
pH: 6 (ref 5.0–8.0)

## 2013-10-07 LAB — WET PREP, GENITAL
Trich, Wet Prep: NONE SEEN
Yeast Wet Prep HPF POC: NONE SEEN

## 2013-10-07 LAB — URINE MICROSCOPIC-ADD ON

## 2013-10-07 LAB — GC/CHLAMYDIA PROBE AMP
CT Probe RNA: NEGATIVE
GC Probe RNA: NEGATIVE

## 2013-10-07 LAB — PREGNANCY, URINE: Preg Test, Ur: NEGATIVE

## 2013-10-07 MED ORDER — TRAMADOL HCL 50 MG PO TABS: 50.0000 mg | ORAL_TABLET | Freq: Four times a day (QID) | ORAL | Status: DC | PRN

## 2013-10-07 MED ORDER — METRONIDAZOLE 500 MG PO TABS: 500.0000 mg | ORAL_TABLET | Freq: Two times a day (BID) | ORAL | Status: DC

## 2013-10-07 NOTE — ED Notes (Signed)
MD Kohut at bedside. 

## 2013-10-07 NOTE — ED Provider Notes (Signed)
CSN: [id number]     Arrival date & time 10/07/13  7829 History   First MD Initiated Contact with Patient 10/07/13 0831     Chief Complaint  Patient presents with  . Vaginal Discharge  . Abdominal Pain   (Consider location/radiation/quality/duration/timing/severity/associated sxs/prior Treatment) HPI  37yF with vaginal discharge. Malodorous. Clear to "milky" color. Noticed increase in discharge about 3w ago. Thought may be yeast infection so took a dose of diflucan w/o change. Now mild lower abdominal cramping. "Kinda like period cramps." No fever or chills. No urinary complaints, but works in [employer] and checked urine dip which she reports was positive for blood/ketones. She denies gross hematuria. No dysuria. Sexually active. S/p b/l salpingectomy.  Past Medical History  Diagnosis Date  . Anxiety   . GERD (gastroesophageal reflux disease)   . STD (female)     HX of STD's  . Abnormal Pap smear 1998    had colpo  . 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 Bicornuate uterus   . Hypertension 2010  . Bicornate uterus   . Headache(784.0)   . Chlamydia 2000  . IUP (intrauterine pregnancy), incidental 13 weeks  . PONV (postoperative nausea and vomiting)   . Asthma     ,as child only   no inhaler  . HCV infection     HX of Hep C   Past Surgical History  Procedure Laterality Date  . Therapeutic abortion    . Therapeutic abortion    . Tonsillectomy    . Dilation and curettage of uterus      SAB  . Finger surgery  2003    reattachment of right forefinger  . Cervical cerclage  09/01/2011    Procedure: CERCLAGE CERVICAL;  Surgeon: Reva Bores, MD;  Location: [facility];  Service: Gynecology;  Laterality: N/A;  . Cervical cerclage  06/28/2012    Procedure: CERCLAGE CERVICAL;  Surgeon: Reva Bores, MD;  Location: [facility];  Service: Gynecology;  Laterality: N/A;  . Bilateral salpingectomy  12/06/2012    Procedure: BILATERAL SALPINGECTOMY;  Surgeon: Catalina Antigua, MD;   Location: [facility];  Service: Gynecology;  Laterality: Bilateral;  laparoscopic   Family History  Problem Relation Age of Onset  . Hypertension Mother   . Thyroid disease Maternal Grandmother   . Diabetes Paternal Grandfather   . Anesthesia problems Neg Hx   . Other Neg Hx    History  Substance Use Topics  . Smoking status: Current Every Day Smoker -- 1.00 packs/day for 20 years    Types: Cigarettes  . Smokeless tobacco: Never Used     Comment: nicotine patch currently  . Alcohol Use: No     Comment: sober since 01/22/11   OB History   Grav Para Term Preterm Abortions TAB SAB Ect Mult Living   7 4 2 2 3 2 1  0 0 2     Review of Systems  All systems reviewed and negative, other than as noted in HPI.   Allergies  Vicodin  Home Medications   Current Outpatient Rx  Name  Route  Sig  Dispense  Refill  . ibuprofen (ADVIL,MOTRIN) 200 MG tablet   Oral   Take 600 mg by mouth daily.          Marland Kitchen labetalol (NORMODYNE) 100 MG tablet   Oral   Take 3 tablets (300 mg total) by mouth 2 (two) times daily.   120 tablet   1   . metroNIDAZOLE (  FLAGYL) 500 MG tablet   Oral   Take 1 tablet (500 mg total) by mouth 2 (two) times daily. One po bid x 7 days   20 tablet   0   . penicillin v potassium (VEETID) 500 MG tablet   Oral   Take 1 tablet (500 mg total) by mouth 4 (four) times daily.   30 tablet   0    BP 170/123  Pulse 96  Temp(Src) 97.7 F (36.5 C) (Oral)  Resp 12  SpO2 99%  LMP 09/22/2013 Physical Exam  Nursing note and vitals reviewed. Constitutional: She appears well-developed and well-nourished. No distress.  HENT:  Head: Normocephalic and atraumatic.  Eyes: Conjunctivae are normal. Right eye exhibits no discharge. Left eye exhibits no discharge.  Neck: Neck supple.  Cardiovascular: Normal rate, regular rhythm and normal heart sounds.  Exam reveals no gallop and no friction rub.   No murmur heard. Pulmonary/Chest: Effort normal and breath sounds normal. No  respiratory distress.  Abdominal: Soft. She exhibits no distension. There is no tenderness.  Genitourinary:  No cva tenderness.   Chaperone was present for pelvic examination. Tattoo on mons.. No concerning external lesions noted. Moderate, thin white discharge. Cervix normal in appearance. No CMT. No adnexal mass or tenderness appreciated.   Musculoskeletal: She exhibits no edema and no tenderness.  Neurological: She is alert.  Skin: Skin is warm and dry.  Psychiatric: She has a normal mood and affect. Her behavior is normal. Thought content normal.    ED Course  Procedures (including critical care time) Labs Review Labs Reviewed  WET PREP, GENITAL - Abnormal; Notable for the following:    Clue Cells Wet Prep HPF POC FEW (*)    WBC, Wet Prep HPF POC MODERATE (*)    All other components within normal limits  URINALYSIS, ROUTINE W REFLEX MICROSCOPIC - Abnormal; Notable for the following:    APPearance CLOUDY (*)    Hgb urine dipstick TRACE (*)    Protein, ur 30 (*)    Leukocytes, UA SMALL (*)    All other components within normal limits  URINE MICROSCOPIC-ADD ON - Abnormal; Notable for the following:    Squamous Epithelial / LPF MANY (*)    Bacteria, UA MANY (*)    All other components within normal limits  GC/CHLAMYDIA PROBE AMP  URINE CULTURE  PREGNANCY, URINE   Imaging Review No results found.  EKG Interpretation   None       MDM   1. Bacterial vaginosis    37 year old female with vaginal discharge and alert. Clue cells noted on wet prep. Will treat for bacterial vaginosis. Gonorrhea and Chlamydia cultures pending. Patient is declining empiric treatment. Outpatient followup. Return precautions discussed.    Raeford Razor, MD 10/08/13 1003

## 2013-10-07 NOTE — ED Notes (Signed)
Pt c/o clear, malodorous vaginal discharge x 3 wks and low abd pain since yesterday.  She states that she works at the [employer] and did a dipstick on her urine yesterday and it showed "high ketones" which she attributes to an STD ("which she knows she doesn't have").

## 2013-10-08 LAB — URINE CULTURE

## 2013-11-16 ENCOUNTER — Emergency Department (HOSPITAL_COMMUNITY): Payer: Self-pay

## 2013-11-16 ENCOUNTER — Emergency Department (HOSPITAL_COMMUNITY)
Admission: EM | Admit: 2013-11-16 | Discharge: 2013-11-16 | Disposition: A | Discharge status: Home / Self Care | Payer: Self-pay | Attending: Emergency Medicine | Admitting: Emergency Medicine

## 2013-11-16 DIAGNOSIS — Q513 Bicornate uterus: Secondary | ICD-10-CM | POA: Insufficient documentation

## 2013-11-16 DIAGNOSIS — F411 Generalized anxiety disorder: Secondary | ICD-10-CM | POA: Insufficient documentation

## 2013-11-16 DIAGNOSIS — J029 Acute pharyngitis, unspecified: Secondary | ICD-10-CM | POA: Insufficient documentation

## 2013-11-16 DIAGNOSIS — IMO0001 Reserved for inherently not codable concepts without codable children: Secondary | ICD-10-CM | POA: Insufficient documentation

## 2013-11-16 DIAGNOSIS — Z8619 Personal history of other infectious and parasitic diseases: Secondary | ICD-10-CM | POA: Insufficient documentation

## 2013-11-16 DIAGNOSIS — F17200 Nicotine dependence, unspecified, uncomplicated: Secondary | ICD-10-CM | POA: Insufficient documentation

## 2013-11-16 DIAGNOSIS — J45909 Unspecified asthma, uncomplicated: Secondary | ICD-10-CM | POA: Insufficient documentation

## 2013-11-16 DIAGNOSIS — H9209 Otalgia, unspecified ear: Secondary | ICD-10-CM | POA: Insufficient documentation

## 2013-11-16 DIAGNOSIS — J111 Influenza due to unidentified influenza virus with other respiratory manifestations: Secondary | ICD-10-CM | POA: Insufficient documentation

## 2013-11-16 DIAGNOSIS — Z8719 Personal history of other diseases of the digestive system: Secondary | ICD-10-CM | POA: Insufficient documentation

## 2013-11-16 DIAGNOSIS — I10 Essential (primary) hypertension: Secondary | ICD-10-CM | POA: Insufficient documentation

## 2013-11-16 DIAGNOSIS — Z79899 Other long term (current) drug therapy: Secondary | ICD-10-CM | POA: Insufficient documentation

## 2013-11-16 LAB — CBC WITH DIFFERENTIAL/PLATELET
Basophils Absolute: 0 10*3/uL (ref 0.0–0.1)
Eosinophils Absolute: 0 10*3/uL (ref 0.0–0.7)
Eosinophils Relative: 0 % (ref 0–5)
HCT: 43.5 % (ref 36.0–46.0)
Hemoglobin: 15.6 g/dL — ABNORMAL HIGH (ref 12.0–15.0)
Lymphocytes Relative: 24 % (ref 12–46)
Lymphs Abs: 1.3 10*3/uL (ref 0.7–4.0)
MCV: 83.2 fL (ref 78.0–100.0)
Monocytes Absolute: 1.1 10*3/uL — ABNORMAL HIGH (ref 0.1–1.0)
Monocytes Relative: 20 % — ABNORMAL HIGH (ref 3–12)
Neutro Abs: 2.9 10*3/uL (ref 1.7–7.7)
RBC: 5.23 MIL/uL — ABNORMAL HIGH (ref 3.87–5.11)
RDW: 13.5 % (ref 11.5–15.5)
WBC: 5.4 10*3/uL (ref 4.0–10.5)

## 2013-11-16 LAB — POCT I-STAT, CHEM 8
BUN: 7 mg/dL (ref 6–23)
Calcium, Ion: 1.22 mmol/L (ref 1.12–1.23)
Chloride: 104 mEq/L (ref 96–112)
Creatinine, Ser: 0.8 mg/dL (ref 0.50–1.10)
TCO2: 23 mmol/L (ref 0–100)

## 2013-11-16 MED ORDER — OSELTAMIVIR PHOSPHATE 75 MG PO CAPS: 75.0000 mg | ORAL_CAPSULE | Freq: Two times a day (BID) | ORAL | Status: DC

## 2013-11-16 NOTE — ED Notes (Addendum)
Elevated temp since last night. Labile. Cough and general body aches. Productive cough.  green

## 2013-11-16 NOTE — ED Provider Notes (Signed)
Medical screening examination/treatment/procedure(s) were performed by non-physician practitioner and as supervising physician I was immediately available for consultation/collaboration.   Adylynn Hertenstein, MD 11/16/13 2248 

## 2013-11-16 NOTE — ED Provider Notes (Signed)
CSN: [id number]     Arrival date & time 11/16/13  2014 History   First MD Initiated Contact with Patient 11/16/13 2037     Chief Complaint  Patient presents with  . Fever  . Cough   (Consider location/radiation/quality/duration/timing/severity/associated sxs/prior Treatment) HPI Comments: Patient is a 37 year old female with history of anxeity, GERD, opiate dependence, hypertension, and asthma who presents today with generalized myalgias, cough, congestion, fever. Her symptoms began yesterday and have gradually worsened since that time. She had associated sinus headache with bilateral ear pain right worse than left. This pain has also gradually worsened since yesterday. Fever has been subjective. Her cough is productive with green phlegm. She has tried theraflu without relief. She had a friend with similar symptoms earlier in the week. No chest pain, shortness of breath, nausea, vomiting, abdominal pain, numbness, weakness, paresthesias.   The history is provided by the patient. No language interpreter was used.    Past Medical History  Diagnosis Date  . Anxiety   . GERD (gastroesophageal reflux disease)   . STD (female)     HX of STD's  . Abnormal Pap smear 1998    had colpo  . 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 Bicornuate uterus   . Hypertension 2010  . Bicornate uterus   . Headache(784.0)   . Chlamydia 2000  . IUP (intrauterine pregnancy), incidental 13 weeks  . PONV (postoperative nausea and vomiting)   . Asthma     ,as child only   no inhaler  . HCV infection     HX of Hep C   Past Surgical History  Procedure Laterality Date  . Therapeutic abortion    . Therapeutic abortion    . Tonsillectomy    . Dilation and curettage of uterus      SAB  . Finger surgery  2003    reattachment of right forefinger  . Cervical cerclage  09/01/2011    Procedure: CERCLAGE CERVICAL;  Surgeon: Reva Bores, MD;  Location: [facility];  Service: Gynecology;  Laterality: N/A;   . Cervical cerclage  06/28/2012    Procedure: CERCLAGE CERVICAL;  Surgeon: Reva Bores, MD;  Location: [facility];  Service: Gynecology;  Laterality: N/A;  . Bilateral salpingectomy  12/06/2012    Procedure: BILATERAL SALPINGECTOMY;  Surgeon: Catalina Antigua, MD;  Location: [facility];  Service: Gynecology;  Laterality: Bilateral;  laparoscopic   Family History  Problem Relation Age of Onset  . Hypertension Mother   . Thyroid disease Maternal Grandmother   . Diabetes Paternal Grandfather   . Anesthesia problems Neg Hx   . Other Neg Hx    History  Substance Use Topics  . Smoking status: Current Every Day Smoker -- 1.00 packs/day for 20 years    Types: Cigarettes  . Smokeless tobacco: Never Used     Comment: nicotine patch currently  . Alcohol Use: No     Comment: sober since 01/22/11   OB History   Grav Para Term Preterm Abortions TAB SAB Ect Mult Living   7 4 2 2 3 2 1  0 0 2     Review of Systems  Constitutional: Positive for fever and chills.  HENT: Positive for congestion, ear pain, rhinorrhea and sore throat.   Respiratory: Positive for cough. Negative for shortness of breath.   Cardiovascular: Negative for chest pain.  Gastrointestinal: Negative for nausea, vomiting and abdominal pain.  Musculoskeletal: Positive for myalgias.  All other systems reviewed  and are negative.    Allergies  Vicodin  Home Medications   Current Outpatient Rx  Name  Route  Sig  Dispense  Refill  . labetalol (NORMODYNE) 100 MG tablet   Oral   Take 200 mg by mouth 2 (two) times daily.         . traMADol (ULTRAM) 50 MG tablet   Oral   Take 1 tablet (50 mg total) by mouth every 6 (six) hours as needed.   10 tablet   0   . metroNIDAZOLE (FLAGYL) 500 MG tablet   Oral   Take 1 tablet (500 mg total) by mouth 2 (two) times daily.   14 tablet   0    BP 179/137  Pulse 114  Temp(Src) 99.1 F (37.3 C) (Oral)  Resp 20  Wt 163 lb 9 oz (74.191 kg)  SpO2 95%  LMP 10/23/2013 Physical Exam   Nursing note and vitals reviewed. Constitutional: She is oriented to person, place, and time. She appears well-developed and well-nourished. She does not appear ill. No distress.  Generally quite well appearing  HENT:  Head: Normocephalic and atraumatic.  Right Ear: Tympanic membrane, external ear and ear canal normal.  Left Ear: Tympanic membrane, external ear and ear canal normal.  Nose: Right sinus exhibits frontal sinus tenderness. Left sinus exhibits frontal sinus tenderness.  Mouth/Throat: Uvula is midline, oropharynx is clear and moist and mucous membranes are normal.  Eyes: Conjunctivae, EOM and lids are normal. Pupils are equal, round, and reactive to light.  Neck: Normal range of motion.  No nuchal rigidity or meningeal signs  Cardiovascular: Normal rate, regular rhythm and normal heart sounds.   Pulmonary/Chest: Effort normal and breath sounds normal. No stridor. No respiratory distress. She has no decreased breath sounds. She has no wheezes. She has no rhonchi. She has no rales.  Abdominal: Soft. She exhibits no distension. There is no tenderness.  Musculoskeletal: Normal range of motion.  Neurological: She is alert and oriented to person, place, and time. She has normal strength.  Skin: Skin is warm and dry. She is not diaphoretic. No erythema.  Psychiatric: She has a normal mood and affect. Her behavior is normal.    ED Course  Procedures (including critical care time) Labs Review Labs Reviewed  CBC WITH DIFFERENTIAL - Abnormal; Notable for the following:    RBC 5.23 (*)    Hemoglobin 15.6 (*)    Monocytes Relative 20 (*)    Monocytes Absolute 1.1 (*)    All other components within normal limits  POCT I-STAT, CHEM 8 - Abnormal; Notable for the following:    Glucose, Bld 140 (*)    Hemoglobin 16.0 (*)    HCT 47.0 (*)    All other components within normal limits   Imaging Review Dg Chest 2 View  11/16/2013   CLINICAL DATA:  Fever, cough, congestion, body aches,  headache, history hypertension, asthma  EXAM: CHEST  2 VIEW  COMPARISON:  06/22/2013  FINDINGS: Upper-normal size of cardiac silhouette.  Mediastinal contours and pulmonary vascularity normal.  Minimal chronic peribronchial thickening consistent with history of asthma.  No acute infiltrate, pleural effusion or pneumothorax.  Bones unremarkable.  IMPRESSION: No acute abnormalities.   Electronically Signed   By: Ulyses Southward M.D.   On: 11/16/2013 21:03    EKG Interpretation   None       MDM   1. Influenza    Patient presents with influenza like symptoms for 24 hours. Labs and chest xray  are unremarkable. Patient appears generally well. Pt appropriate for outpatient symptomatic treatment. She was given rx for Tamiflu. Pt was initially hypertensive, but asymptomatic. Pt was not hypertensive on my exam or prior to discharge. She takes labetalol at home. Strict return instructions given. Vital signs stable for discharge. Pt to follow up with PCP. Patient / Family / Caregiver informed of clinical course, understand medical decision-making process, and agree with plan.     Mora Bellman, PA-C 11/16/13 2135

## 2013-11-16 NOTE — ED Notes (Signed)
Patient presents stating that she has not been feeling well for several.  Today she presents with generalized discomfort, headache, cough.  States her friend was sick eariler in the week now she is feeling the same way.

## 2013-12-13 ENCOUNTER — Encounter (HOSPITAL_COMMUNITY): Payer: Self-pay | Admitting: Emergency Medicine

## 2013-12-13 ENCOUNTER — Emergency Department (HOSPITAL_COMMUNITY)
Admission: EM | Admit: 2013-12-13 | Discharge: 2013-12-13 | Discharge status: Against Medical Advice | Payer: Self-pay | Attending: Emergency Medicine | Admitting: Emergency Medicine

## 2013-12-13 DIAGNOSIS — Z3202 Encounter for pregnancy test, result negative: Secondary | ICD-10-CM | POA: Insufficient documentation

## 2013-12-13 DIAGNOSIS — J45909 Unspecified asthma, uncomplicated: Secondary | ICD-10-CM | POA: Insufficient documentation

## 2013-12-13 DIAGNOSIS — R350 Frequency of micturition: Secondary | ICD-10-CM | POA: Insufficient documentation

## 2013-12-13 DIAGNOSIS — M545 Low back pain, unspecified: Secondary | ICD-10-CM | POA: Insufficient documentation

## 2013-12-13 DIAGNOSIS — I10 Essential (primary) hypertension: Secondary | ICD-10-CM | POA: Insufficient documentation

## 2013-12-13 DIAGNOSIS — N390 Urinary tract infection, site not specified: Secondary | ICD-10-CM | POA: Insufficient documentation

## 2013-12-13 LAB — COMPREHENSIVE METABOLIC PANEL
ALT: 22 U/L (ref 0–35)
AST: 20 U/L (ref 0–37)
Albumin: 4 g/dL (ref 3.5–5.2)
Alkaline Phosphatase: 64 U/L (ref 39–117)
BUN: 11 mg/dL (ref 6–23)
CO2: 20 mEq/L (ref 19–32)
Calcium: 9.3 mg/dL (ref 8.4–10.5)
Chloride: 105 mEq/L (ref 96–112)
Creatinine, Ser: 0.58 mg/dL (ref 0.50–1.10)
GFR calc Af Amer: >90 mL/min (ref >90)
GFR calc non Af Amer: >90 mL/min (ref >90)
Glucose, Bld: 98 mg/dL (ref 70–99)
Potassium: 4.2 mEq/L (ref 3.7–5.3)
SODIUM: 140 meq/L (ref 137–147)
TOTAL PROTEIN: 7.3 g/dL (ref 6.0–8.3)
Total Bilirubin: <0.2 mg/dL — ABNORMAL LOW (ref 0.3–1.2)

## 2013-12-13 LAB — CBC WITH DIFFERENTIAL/PLATELET
Basophils Absolute: 0 10*3/uL (ref 0.0–0.1)
Basophils Relative: 0 % (ref 0–1)
EOS ABS: 0.1 10*3/uL (ref 0.0–0.7)
Eosinophils Relative: 1 % (ref 0–5)
HCT: 41.7 % (ref 36.0–46.0)
Hemoglobin: 14.5 g/dL (ref 12.0–15.0)
Lymphocytes Relative: 24 % (ref 12–46)
Lymphs Abs: 2.9 10*3/uL (ref 0.7–4.0)
MCH: 29.5 pg (ref 26.0–34.0)
MCHC: 34.8 g/dL (ref 30.0–36.0)
MCV: 84.9 fL (ref 78.0–100.0)
Monocytes Absolute: 1.1 10*3/uL — ABNORMAL HIGH (ref 0.1–1.0)
Monocytes Relative: 9 % (ref 3–12)
NEUTROS PCT: 66 % (ref 43–77)
Neutro Abs: 7.8 10*3/uL — ABNORMAL HIGH (ref 1.7–7.7)
PLATELETS: 232 10*3/uL (ref 150–400)
RBC: 4.91 MIL/uL (ref 3.87–5.11)
RDW: 13.8 % (ref 11.5–15.5)
WBC: 11.8 10*3/uL — ABNORMAL HIGH (ref 4.0–10.5)

## 2013-12-13 LAB — URINE MICROSCOPIC-ADD ON

## 2013-12-13 LAB — URINALYSIS, ROUTINE W REFLEX MICROSCOPIC
BILIRUBIN URINE: NEGATIVE
Glucose, UA: NEGATIVE mg/dL
Ketones, ur: NEGATIVE mg/dL
NITRITE: NEGATIVE
Protein, ur: 100 mg/dL — AB
SPECIFIC GRAVITY, URINE: 1.012 (ref 1.005–1.030)
UROBILINOGEN UA: 0.2 mg/dL (ref 0.0–1.0)
pH: 6 (ref 5.0–8.0)

## 2013-12-13 LAB — POCT PREGNANCY, URINE: Preg Test, Ur: NEGATIVE

## 2013-12-13 NOTE — ED Notes (Signed)
Pt st's she thinks she has UTI.  St's she is having lower back pain with no known injury.  Pt st's urinary frequency with small amounts.

## 2013-12-14 ENCOUNTER — Encounter (HOSPITAL_COMMUNITY): Payer: Self-pay | Admitting: Emergency Medicine

## 2013-12-14 ENCOUNTER — Emergency Department (HOSPITAL_COMMUNITY)
Admission: EM | Admit: 2013-12-14 | Discharge: 2013-12-14 | Disposition: A | Discharge status: Home / Self Care | Payer: Self-pay | Attending: Emergency Medicine | Admitting: Emergency Medicine

## 2013-12-14 DIAGNOSIS — F17200 Nicotine dependence, unspecified, uncomplicated: Secondary | ICD-10-CM | POA: Insufficient documentation

## 2013-12-14 DIAGNOSIS — Z8619 Personal history of other infectious and parasitic diseases: Secondary | ICD-10-CM | POA: Insufficient documentation

## 2013-12-14 DIAGNOSIS — Z8659 Personal history of other mental and behavioral disorders: Secondary | ICD-10-CM | POA: Insufficient documentation

## 2013-12-14 DIAGNOSIS — I10 Essential (primary) hypertension: Secondary | ICD-10-CM | POA: Insufficient documentation

## 2013-12-14 DIAGNOSIS — J45909 Unspecified asthma, uncomplicated: Secondary | ICD-10-CM | POA: Insufficient documentation

## 2013-12-14 DIAGNOSIS — R110 Nausea: Secondary | ICD-10-CM | POA: Insufficient documentation

## 2013-12-14 DIAGNOSIS — N12 Tubulo-interstitial nephritis, not specified as acute or chronic: Secondary | ICD-10-CM | POA: Insufficient documentation

## 2013-12-14 DIAGNOSIS — Z9089 Acquired absence of other organs: Secondary | ICD-10-CM | POA: Insufficient documentation

## 2013-12-14 DIAGNOSIS — Z8719 Personal history of other diseases of the digestive system: Secondary | ICD-10-CM | POA: Insufficient documentation

## 2013-12-14 DIAGNOSIS — Z3202 Encounter for pregnancy test, result negative: Secondary | ICD-10-CM | POA: Insufficient documentation

## 2013-12-14 DIAGNOSIS — Z79899 Other long term (current) drug therapy: Secondary | ICD-10-CM | POA: Insufficient documentation

## 2013-12-14 LAB — CBC WITH DIFFERENTIAL/PLATELET
BASOS ABS: 0 10*3/uL (ref 0.0–0.1)
BASOS PCT: 0 % (ref 0–1)
EOS ABS: 0.1 10*3/uL (ref 0.0–0.7)
EOS PCT: 1 % (ref 0–5)
HEMATOCRIT: 44.2 % (ref 36.0–46.0)
Hemoglobin: 15.5 g/dL — ABNORMAL HIGH (ref 12.0–15.0)
Lymphocytes Relative: 21 % (ref 12–46)
Lymphs Abs: 2.1 10*3/uL (ref 0.7–4.0)
MCH: 29.4 pg (ref 26.0–34.0)
MCHC: 35.1 g/dL (ref 30.0–36.0)
MCV: 83.9 fL (ref 78.0–100.0)
MONO ABS: 0.9 10*3/uL (ref 0.1–1.0)
Monocytes Relative: 9 % (ref 3–12)
NEUTROS ABS: 7 10*3/uL (ref 1.7–7.7)
Neutrophils Relative %: 69 % (ref 43–77)
Platelets: 254 10*3/uL (ref 150–400)
RBC: 5.27 MIL/uL — ABNORMAL HIGH (ref 3.87–5.11)
RDW: 13.7 % (ref 11.5–15.5)
WBC: 10.2 10*3/uL (ref 4.0–10.5)

## 2013-12-14 LAB — BASIC METABOLIC PANEL
BUN: 8 mg/dL (ref 6–23)
CHLORIDE: 104 meq/L (ref 96–112)
CO2: 21 mEq/L (ref 19–32)
CREATININE: 0.64 mg/dL (ref 0.50–1.10)
Calcium: 9.4 mg/dL (ref 8.4–10.5)
GFR calc non Af Amer: >90 mL/min (ref >90)
GLUCOSE: 93 mg/dL (ref 70–99)
Potassium: 4.2 mEq/L (ref 3.7–5.3)
Sodium: 139 mEq/L (ref 137–147)

## 2013-12-14 LAB — URINE MICROSCOPIC-ADD ON

## 2013-12-14 LAB — WET PREP, GENITAL
Clue Cells Wet Prep HPF POC: NONE SEEN
TRICH WET PREP: NONE SEEN
WBC, Wet Prep HPF POC: NONE SEEN
Yeast Wet Prep HPF POC: NONE SEEN

## 2013-12-14 LAB — URINALYSIS, ROUTINE W REFLEX MICROSCOPIC
Bilirubin Urine: NEGATIVE
Glucose, UA: NEGATIVE mg/dL
Ketones, ur: NEGATIVE mg/dL
Nitrite: NEGATIVE
Protein, ur: 30 mg/dL — AB
SPECIFIC GRAVITY, URINE: 1.011 (ref 1.005–1.030)
UROBILINOGEN UA: 0.2 mg/dL (ref 0.0–1.0)
pH: 6 (ref 5.0–8.0)

## 2013-12-14 LAB — PREGNANCY, URINE: Preg Test, Ur: NEGATIVE

## 2013-12-14 MED ORDER — ONDANSETRON HCL 4 MG/2ML IJ SOLN
4.0000 mg | Freq: Once | INTRAMUSCULAR | Status: AC
  Administered 2013-12-14: 4 mg via INTRAVENOUS
  Filled 2013-12-14: qty 2

## 2013-12-14 MED ORDER — MORPHINE SULFATE 4 MG/ML IJ SOLN
4.0000 mg | Freq: Once | INTRAMUSCULAR | Status: AC
Start: 2013-12-14 — End: 2013-12-14
  Administered 2013-12-14: 4 mg via INTRAVENOUS
  Filled 2013-12-14: qty 1

## 2013-12-14 MED ORDER — SODIUM CHLORIDE 0.9 % IV BOLUS (SEPSIS)
1000.0000 mL | Freq: Once | INTRAVENOUS | Status: AC
  Administered 2013-12-14: 1000 mL via INTRAVENOUS

## 2013-12-14 MED ORDER — SULFAMETHOXAZOLE-TRIMETHOPRIM 800-160 MG PO TABS: 1.0000 | ORAL_TABLET | Freq: Two times a day (BID) | ORAL | Status: AC

## 2013-12-14 MED ORDER — KETOROLAC TROMETHAMINE 30 MG/ML IJ SOLN
30.0000 mg | Freq: Once | INTRAMUSCULAR | Status: AC
  Administered 2013-12-14: 30 mg via INTRAVENOUS
  Filled 2013-12-14: qty 1

## 2013-12-14 MED ORDER — SULFAMETHOXAZOLE-TMP DS 800-160 MG PO TABS
1.0000 | ORAL_TABLET | Freq: Once | ORAL | Status: AC
  Administered 2013-12-14: 1 via ORAL
  Filled 2013-12-14: qty 1

## 2013-12-14 NOTE — ED Notes (Signed)
Patient ambulated to restroom, tolerate well.

## 2013-12-14 NOTE — ED Provider Notes (Signed)
CSN: [id number]     Arrival date & time 12/14/13  0804 History   First MD Initiated Contact with Patient 12/14/13 [phone]     Chief Complaint  Patient presents with  . Urinary Tract Infection   (Consider location/radiation/quality/duration/timing/severity/associated sxs/prior Treatment) The history is provided by the patient. No language interpreter was used.  [name] is a 38 year old female with past medical history of anxiety, GERD, STD, hypertension, asthma, hepatitis C virus presenting to emergency Department with UTI symptoms. Patient reported that the symptoms started a week and a half ago. Patient reports that she feels a constant pressure sensation to the pelvic region and when going to the bathroom, feels a pushing sensation with urinating-denied burning sensation. Stated  That she has been experiencing lower back pain described as a throbbing sensation that is constant starting yesterday. Stated that she's been feeling mildly nauseous. Reported drinking cranberry juice with water and using Tylenol with minimal relief. Reported that she is sexually active, has had the same partner within the past 6 months-denied use of protection. Reported last menstrual period to be 11/23/2013. Denied fever, vomiting, chest pain, shortness of breath, difficulty breathing, melena, hematochezia, headache, dizziness, vaginal bleeding, vaginal discharge. PCP none   Past Medical History  Diagnosis Date  . Anxiety   . GERD (gastroesophageal reflux disease)   . STD (female)     HX of STD's  . Abnormal Pap smear 1998    had colpo  . 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 Bicornuate uterus   . Hypertension 2010  . Bicornate uterus   . Headache(784.0)   . Chlamydia 2000  . IUP (intrauterine pregnancy), incidental 13 weeks  . PONV (postoperative nausea and vomiting)   . Asthma     ,as child only   no inhaler  . HCV infection     HX of Hep C   Past Surgical History  Procedure  Laterality Date  . Therapeutic abortion    . Therapeutic abortion    . Tonsillectomy    . Dilation and curettage of uterus      SAB  . Finger surgery  2003    reattachment of right forefinger  . Cervical cerclage  09/01/2011    Procedure: CERCLAGE CERVICAL;  Surgeon: Donnamae Jude, MD;  Location: [facility];  Service: Gynecology;  Laterality: N/A;  . Cervical cerclage  06/28/2012    Procedure: CERCLAGE CERVICAL;  Surgeon: Donnamae Jude, MD;  Location: [facility];  Service: Gynecology;  Laterality: N/A;  . Bilateral salpingectomy  12/06/2012    Procedure: BILATERAL SALPINGECTOMY;  Surgeon: Mora Bellman, MD;  Location: [facility];  Service: Gynecology;  Laterality: Bilateral;  laparoscopic   Family History  Problem Relation Age of Onset  . Hypertension Mother   . Thyroid disease Maternal Grandmother   . Diabetes Paternal Grandfather   . Anesthesia problems Neg Hx   . Other Neg Hx    History  Substance Use Topics  . Smoking status: Current Every Day Smoker -- 1.00 packs/day for 20 years    Types: Cigarettes  . Smokeless tobacco: Never Used     Comment: nicotine patch currently  . Alcohol Use: No     Comment: sober since 01/22/11   OB History   Grav Para Term Preterm Abortions TAB SAB Ect Mult Living   7 4 2 2 3 2 1  0 0 2     Review of Systems  Constitutional: Positive for chills. Negative for fever.  Respiratory: Negative for chest tightness and shortness of breath.   Cardiovascular: Negative for chest pain.  Gastrointestinal: Positive for nausea. Negative for vomiting, abdominal pain, diarrhea, constipation, blood in stool and anal bleeding.  Genitourinary: Positive for dysuria, flank pain and pelvic pain. Negative for hematuria, decreased urine volume, vaginal bleeding, vaginal discharge and vaginal pain.  Musculoskeletal: Positive for back pain (Lower back pain).  Neurological: Negative for dizziness and weakness.  All other systems reviewed and are negative.    Allergies   Vicodin  Home Medications   Current Outpatient Rx  Name  Route  Sig  Dispense  Refill  . labetalol (NORMODYNE) 100 MG tablet   Oral   Take 200 mg by mouth 2 (two) times daily.         Marland Kitchen sulfamethoxazole-trimethoprim (BACTRIM DS,SEPTRA DS) 800-160 MG per tablet   Oral   Take 1 tablet by mouth 2 (two) times daily.   28 tablet   0    BP 157/102  Pulse 77  Temp(Src) 98.1 F (36.7 C) (Oral)  Resp 18  Ht 5\' 2"  (1.575 m)  Wt 150 lb (68.04 kg)  BMI 27.43 kg/m2  SpO2 97%  LMP 11/23/2013 Physical Exam  Nursing note and vitals reviewed. Constitutional: She is oriented to person, place, and time. She appears well-developed and well-nourished. No distress.  HENT:  Head: Normocephalic and atraumatic.  Mouth/Throat: Oropharynx is clear and moist. No oropharyngeal exudate.  Eyes: Conjunctivae and EOM are normal. Pupils are equal, round, and reactive to light. Right eye exhibits no discharge. Left eye exhibits no discharge.  Neck: Normal range of motion. Neck supple.  Cardiovascular: Normal rate, regular rhythm and normal heart sounds.  Exam reveals no friction rub.   No murmur heard. Pulses:      Radial pulses are 2+ on the right side, and 2+ on the left side.       Dorsalis pedis pulses are 2+ on the right side, and 2+ on the left side.  Pulmonary/Chest: Effort normal and breath sounds normal. No respiratory distress. She has no wheezes. She has no rales.  Abdominal: Soft. Bowel sounds are normal. There is tenderness in the suprapubic area. There is CVA tenderness. There is no guarding, no tenderness at McBurney's point and negative Murphy's sign.    Discomfort upon palpation to suprapubic region Bilateral CVA tenderness noted-most discomfort to the right  Genitourinary: Vagina normal.  Pelvic exam: Negative swelling, erythema, inflammation, lesions, sores noted to the external genitalia. Negative swelling, erythema, lesions, sores, masses noted to the vaginal canal. Negative  blood in the vaginal folds. Thick white discharge identified-mainly yeast. Cervix identified-negative swelling, friability, inflammation, lesions, sores noted to the cervical os. Negative CMT. Negative bilateral adnexal tenderness.  Musculoskeletal: Normal range of motion.  Full ROM to upper and lower extremities without difficulty noted, negative ataxia noted.  Lymphadenopathy:    She has no cervical adenopathy.  Neurological: She is alert and oriented to person, place, and time. She exhibits normal muscle tone. Coordination normal.  Skin: Skin is warm and dry. No rash noted. She is not diaphoretic. No erythema.  Psychiatric: She has a normal mood and affect. Her behavior is normal. Thought content normal.    ED Course  Procedures (including critical care time)  12:24 PM This provider re-assessed patient. Patient reported that she is feeling better. Patient appears better. Blood pressure more controlled with pain controlled. Negative fever, tachycardia - patient nonseptic. Discussed labs in great detail. Discussed with patient plan for discharge.  Patient understood, all questions answered.   Results for orders placed during the hospital encounter of 12/14/13  WET PREP, GENITAL      Result Value Range   Yeast Wet Prep HPF POC NONE SEEN  NONE SEEN   Trich, Wet Prep NONE SEEN  NONE SEEN   Clue Cells Wet Prep HPF POC NONE SEEN  NONE SEEN   WBC, Wet Prep HPF POC NONE SEEN  NONE SEEN  PREGNANCY, URINE      Result Value Range   Preg Test, Ur NEGATIVE  NEGATIVE  URINALYSIS, ROUTINE W REFLEX MICROSCOPIC      Result Value Range   Color, Urine YELLOW  YELLOW   APPearance TURBID (*) CLEAR   Specific Gravity, Urine 1.011  1.005 - 1.030   pH 6.0  5.0 - 8.0   Glucose, UA NEGATIVE  NEGATIVE mg/dL   Hgb urine dipstick MODERATE (*) NEGATIVE   Bilirubin Urine NEGATIVE  NEGATIVE   Ketones, ur NEGATIVE  NEGATIVE mg/dL   Protein, ur 30 (*) NEGATIVE mg/dL   Urobilinogen, UA 0.2  0.0 - 1.0 mg/dL    Nitrite NEGATIVE  NEGATIVE   Leukocytes, UA LARGE (*) NEGATIVE  BASIC METABOLIC PANEL      Result Value Range   Sodium 139  137 - 147 mEq/L   Potassium 4.2  3.7 - 5.3 mEq/L   Chloride 104  96 - 112 mEq/L   CO2 21  19 - 32 mEq/L   Glucose, Bld 93  70 - 99 mg/dL   BUN 8  6 - 23 mg/dL   Creatinine, Ser 0.64  0.50 - 1.10 mg/dL   Calcium 9.4  8.4 - 10.5 mg/dL   GFR calc non Af Amer >90  >90 mL/min   GFR calc Af Amer >90  >90 mL/min  CBC WITH DIFFERENTIAL      Result Value Range   WBC 10.2  4.0 - 10.5 K/uL   RBC 5.27 (*) 3.87 - 5.11 MIL/uL   Hemoglobin 15.5 (*) 12.0 - 15.0 g/dL   HCT 44.2  36.0 - 46.0 %   MCV 83.9  78.0 - 100.0 fL   MCH 29.4  26.0 - 34.0 pg   MCHC 35.1  30.0 - 36.0 g/dL   RDW 13.7  11.5 - 15.5 %   Platelets 254  150 - 400 K/uL   Neutrophils Relative % 69  43 - 77 %   Neutro Abs 7.0  1.7 - 7.7 K/uL   Lymphocytes Relative 21  12 - 46 %   Lymphs Abs 2.1  0.7 - 4.0 K/uL   Monocytes Relative 9  3 - 12 %   Monocytes Absolute 0.9  0.1 - 1.0 K/uL   Eosinophils Relative 1  0 - 5 %   Eosinophils Absolute 0.1  0.0 - 0.7 K/uL   Basophils Relative 0  0 - 1 %   Basophils Absolute 0.0  0.0 - 0.1 K/uL  URINE MICROSCOPIC-ADD ON      Result Value Range   Squamous Epithelial / LPF RARE  RARE   WBC, UA TOO NUMEROUS TO COUNT  <3 WBC/hpf   RBC / HPF 3-6  <3 RBC/hpf   Bacteria, UA RARE  RARE    Labs Review Labs Reviewed  URINALYSIS, ROUTINE W REFLEX MICROSCOPIC - Abnormal; Notable for the following:    APPearance TURBID (*)    Hgb urine dipstick MODERATE (*)    Protein, ur 30 (*)    Leukocytes, UA LARGE (*)  All other components within normal limits  CBC WITH DIFFERENTIAL - Abnormal; Notable for the following:    RBC 5.27 (*)    Hemoglobin 15.5 (*)    All other components within normal limits  WET PREP, GENITAL  GC/CHLAMYDIA PROBE AMP  URINE CULTURE  PREGNANCY, URINE  BASIC METABOLIC PANEL  URINE MICROSCOPIC-ADD ON   Imaging Review No results found.  EKG  Interpretation   None       MDM   1. Pyelonephritis     Medications  sodium chloride 0.9 % bolus 1,000 mL (0 mLs Intravenous Stopped 12/14/13 1101)  ketorolac (TORADOL) 30 MG/ML injection 30 mg (30 mg Intravenous Given 12/14/13 1001)  morphine 4 MG/ML injection 4 mg (4 mg Intravenous Given 12/14/13 1117)  ondansetron (ZOFRAN) injection 4 mg (4 mg Intravenous Given 12/14/13 1118)  sodium chloride 0.9 % bolus 1,000 mL (1,000 mLs Intravenous New Bag/Given 12/14/13 1115)  sulfamethoxazole-trimethoprim (BACTRIM DS) 800-160 MG per tablet 1 tablet (1 tablet Oral Given 12/14/13 1217)   Filed Vitals:   12/14/13 0903 12/14/13 0915 12/14/13 1038 12/14/13 1130  BP: 150/101 165/106 173/106 157/102  Pulse: 76 72 71 77  Temp: 98.1 F (36.7 C)     TempSrc: Oral     Resp: 21  18   Height:      Weight:      SpO2: 97% 97% 98% 97%    Patient presenting to emergency Department urinary tract infection symptoms the been ongoing for approximately a week and half. Stated that she is a constant pressure sensation localized to the suprapubic region-reported with urination that there is a pushing sensation that is constant. Reported that she's been using Tylenol along with drinking water and cranberry juice with minimal relief. Stated that yesterday she noticed lower back pain described as a constant throbbing sensation without radiation. Associated symptoms of nausea and chills. Alert and oriented. GCS 15. Heart rate and rhythm normal. Lungs clear to auscultation. Radial and DP pulses 2+ bilaterally. Positive CVA tenderness bilaterally-most discomfort upon the right side. Bowel sounds normal active in all 4 quadrants. Soft. Tenderness upon palpation to the suprapubic region. Negative acute abdomen, negative peritoneal signs. Full range of motion to upper lower extremities without difficulty or ataxia noted. Pelvic exam noted a thick white discharge-negative CMT, negative adnexal tenderness bilaterally. CBC negative  elevation white blood cell count-negative leukocytosis identified. BMP negative findings. Urine pregnancy negative. Urinalysis noted moderate hemoglobin, large leukocytes with positive pyuria with white blood cell count to numerous to identified. Urine culture in process. Wet prep negative findings for acute infection. GC Chlamydia probe pending. Doubt tubo-ovarian abscess. Doubt ovarian torsion. Doubt acute abdominal processes. Suspicion to be pyelonephritis secondary to bilateral CVA tenderness, high WBC in urine. One round of antibiotics administered in ED setting. Patient stable, afebrile, negative drop of blood pressure, not tachycardic, negative elevation white blood cells-patient appears to be nonseptic. Patient tolerated fluids by mouth without difficulty. Patient discharged. Discharge patient with antibiotics. Referred patient to OB/GYN and urology. Discussed with patient to rest and stay hydrated. Educated patient on what symptoms to watch out for regarding sepsis. Discussed with patient to closely monitor symptoms and if symptoms are to worsen or change to report back to the ED - strict return instructions given.  Patient agreed to plan of care, understood, all questions answered.   Jamse Mead, PA-C 12/14/13 2135

## 2013-12-14 NOTE — Discharge Instructions (Signed)
Please call and set-up an appointment with Urology to be re-assessed regarding kidney infection Please call and set-up an appointment with OBGYN to be re-assessed Please rest and stay hydrated Please take antibiotics as prescribed to aid in infection to be treated Please take Tylenol as needed for pain relief - please no more than 4,000mg /4 grams per day Please continue to monitor symptoms closely and if symptoms are to worsen or change (fever greater than 101, chills, sweating, neck pain, neck stiffness, chest pain, shortness of breath, difficulty breathing, stomach pain, nausea, vomiting, diarrhea, worsening back pain, changes to urine, odor to urine, worsening pain with urination, heart rate increases, dizziness, weakness) please report back to the ED immediately    Pyelonephritis, Adult Pyelonephritis is a kidney infection. In general, there are 2 main types of pyelonephritis:  Infections that come on quickly without any warning (acute pyelonephritis).  Infections that persist for a long period of time (chronic pyelonephritis). CAUSES  Two main causes of pyelonephritis are:  Bacteria traveling from the bladder to the kidney. This is a problem especially in pregnant women. The urine in the bladder can become filled with bacteria from multiple causes, including:  Inflammation of the prostate gland (prostatitis).  Sexual intercourse in females.  Bladder infection (cystitis).  Bacteria traveling from the bloodstream to the tissue part of the kidney. Problems that may increase your risk of getting a kidney infection include:  Diabetes.  Kidney stones or bladder stones.  Cancer.  Catheters placed in the bladder.  Other abnormalities of the kidney or ureter. SYMPTOMS   Abdominal pain.  Pain in the side or flank area.  Fever.  Chills.  Upset stomach.  Blood in the urine (dark urine).  Frequent urination.  Strong or persistent urge to urinate.  Burning or stinging  when urinating. DIAGNOSIS  Your caregiver may diagnose your kidney infection based on your symptoms. A urine sample may also be taken. TREATMENT  In general, treatment depends on how severe the infection is.   If the infection is mild and caught early, your caregiver may treat you with oral antibiotics and send you home.  If the infection is more severe, the bacteria may have gotten into the bloodstream. This will require intravenous (IV) antibiotics and a hospital stay. Symptoms may include:  High fever.  Severe flank pain.  Shaking chills.  Even after a hospital stay, your caregiver may require you to be on oral antibiotics for a period of time.  Other treatments may be required depending upon the cause of the infection. HOME CARE INSTRUCTIONS   Take your antibiotics as directed. Finish them even if you start to feel better.  Make an appointment to have your urine checked to make sure the infection is gone.  Drink enough fluids to keep your urine clear or pale yellow.  Take medicines for the bladder if you have urgency and frequency of urination as directed by your caregiver. SEEK IMMEDIATE MEDICAL CARE IF:   You have a fever or persistent symptoms for more than 2-3 days.  You have a fever and your symptoms suddenly get worse.  You are unable to take your antibiotics or fluids.  You develop shaking chills.  You experience extreme weakness or fainting.  There is no improvement after 2 days of treatment. MAKE SURE YOU:  Understand these instructions.  Will watch your condition.  Will get help right away if you are not doing well or get worse. Document Released: 11/09/2005 Document Revised: 05/10/2012 Document Reviewed: 04/15/2011  ExitCare Patient Information 2014 [place], [place].   Emergency Department Resource Guide 1) Find a Doctor and Pay Out of Pocket Although you won't have to find out who is covered by your insurance plan, it is a good idea to ask around  and get recommendations. You will then need to call the office and see if the doctor you have chosen will accept you as a new patient and what types of options they offer for patients who are self-pay. Some doctors offer discounts or will set up payment plans for their patients who do not have insurance, but you will need to ask so you aren't surprised when you get to your appointment.  2) Contact Your Local Health Department Not all health departments have doctors that can see patients for sick visits, but many do, so it is worth a call to see if yours does. If you don't know where your local health department is, you can check in your phone book. The CDC also has a tool to help you locate your state's health department, and many state websites also have listings of all of their local health departments.  3) Find a Walk-in Clinic If your illness is not likely to be very severe or complicated, you may want to try a walk in clinic. These are popping up all over the country in pharmacies, drugstores, and shopping centers. They're usually staffed by nurse practitioners or physician assistants that have been trained to treat common illnesses and complaints. They're usually fairly quick and inexpensive. However, if you have serious medical issues or chronic medical problems, these are probably not your best option.  No Primary Care Doctor: - Call Health Connect at  [phone] - they can help you locate a primary care doctor that  accepts your insurance, provides certain services, etc. - Physician Referral Service- [phone]  Chronic Pain Problems: Organization         Address  Phone   Notes  [facility]  [phone] Patients need to be referred by their primary care doctor.   Medication Assistance: Organization         Address  Phone   Notes  [facility] Medication [facility] [address] [place] [place], [place] [address] [phone] --Must be a resident  of [facility] -- Must have NO insurance coverage whatsoever (no Medicaid/ Medicare, etc.) -- The pt. MUST have a primary care doctor that directs their care regularly and follows them in the community   [facility]ist  [phone]   [employer]  [phone]    Agencies that provide inexpensive medical care: Organization         Address  Phone   Notes  [facility]  [phone]   [facility]    [phone]   [facility] [address] [place], [place] [address] [phone]   [facility] [place] [address] [place], [place] [phone]   Planned Parenthood    [phone]   [facility]    [phone]   [facility]  [address], [place] Phone:  [phone], Fax:  [phone] Hours of Operation:  9 am - 6 pm, M-F.  Also accepts Medicaid/Medicare and self-pay.  [facility]  [address], [place] Phone: [phone], Fax: [phone]. Hours of Operation:  8:30 am - 5:30 pm, M-F.  Also  accepts Medicaid and self-pay.  [facility] [place] [address] [place] Phone: [phone]   [place], [place], [place] [phone], Ext. [phone] Mondays & Thursdays: 7-9 AM.  First 15 patients are seen on a first come, first serve basis.    [place] Providers:  Organization         Address  Phone   Notes  [facility] [address], [place] [phone] Also accepts self-pay patients.  [facility] [id number] [place], [place]  [phone]   [place], [address], [place] [phone]   [facility] [address], [place] [phone]   Lucianne Lei [address], [place]   [phone] Only accepts [place]  Access [place] patients after they have their name applied to their card.   Self-Pay (no insurance) in [facility]:  Organization         Address  Phone   Notes  Sickle Cell Patients, [facility] [place] [phone]   [facility] [place] [phone]   [facility] [place]  [place], [place], [place] [phone]   [facility]/Dr. Osei-Bonsu  [address] [place] or [address], [place] [phone] Phone number for both [place] and [place] locations is the same.  [facility]gent [facility] [address], [place] [phone]   [facility] [address] [place] or [address] [phone]   [facility] [address], [place] [phone], phone; [phone], fax Sees patients 1st and 3rd Saturday of every month.  Must not qualify for public or private insurance (i.e. Medicaid, Medicare, [place] Health Choice, Veterans' Benefits)  Household income should be no more than 200% of the poverty level The clinic cannot treat you if you are pregnant or think you are pregnant  Sexually transmitted diseases are not treated at the clinic.    Dental Care: Organization         Address  Phone  Notes  [facility] Department of [facility] [phone] Accepts children up to age 105 who are enrolled in [place] or [place]; pregnant women with a Medicaid card; and children who have applied for Medicaid or [place] Health Choice, but were declined, whose parents can pay a reduced fee at time of service.  [facility] Department of [facility]  [address], [place] [phone] Accepts children up to age 86 who are enrolled in [place] or [place]; pregnant women with a  Medicaid card; and children who have applied for Medicaid or [place] Health Choice, but were declined, whose parents can pay a reduced fee at time of service.  [place] Adult [facility] Access PROGRAM  [place] [phone] Patients are seen by appointment only. Walk-ins are not accepted. [place] will see patients 9 years of age and older. Monday - Tuesday (8am-5pm) Most Wednesdays (8:30-5pm) $30 per visit, cash only  [facility] Adult Dental Access PROGRAM  [address], [facility] [phone] Patients are seen by appointment only. Walk-ins are not accepted. [place] will  see patients 29 years of age and older. One Wednesday Evening (Monthly: Volunteer Based).  $30 per visit, cash only  [place]  [phone] for adults; Children under age 64, call Graduate Pediatric Dentistry at [phone]. Children aged 87-14, please call [phone] to request a pediatric application.  Dental services are provided in all areas of dental care including fillings, crowns and bridges, complete and partial dentures, implants, gum treatment, root canals, and extractions. Preventive care is also provided. Treatment is provided to both adults and children. Patients are selected via a lottery and there is often a waiting list.   [facility] [address] [place]  [phone] [email or web address]   [facility] [address] [place], [place] [phone], Ext. [phone] Second and Fourth Thursday of each month, opens at 6:30 AM; Clinic ends at 9 AM.  Patients are seen on a first-come first-served basis, and a limited number are seen during each clinic.   [facility]  [address] Hillard Danker [place], [place] [phone]   Eligibility Requirements You must have lived in [place], [place], or [place] counties for at least the last three months.   You cannot be eligible for state or federal sponsored [employer], including [employer], [place], or [employer].   You generally cannot be eligible for healthcare insurance through your employer.    How to apply: Eligibility screenings are held every Tuesday and Wednesday afternoon from 1:00 pm until 4:00 pm. You do not need an appointment for the interview!  [facility] [address], [place], [place]   [place]  [place] Department  [place]  [phone]    Behavioral Health Resources in the Community: Intensive Outpatient Programs Organization         Address  Phone  Notes  [place]. [address] [place], [place] [phone]   [facility] Outpatient [address] [place], [place]   ADS: Alcohol & Drug S[facility]cs [address] [place], [place]   [place] [address]  [place], [place] or [phone]   Substance Abuse Resources Organization         Address  Phone  Notes  Alcohol and Drug Services  [phone]   [place]  [phone]   [facility] [place]   Chinita Pester  [phone]   Residential & Outpatient Substance Abuse Program  [phone]   Psychological Services Organization         Address  Phone  Notes  [facility] [place]  [place]  [phone]   [place] [address] [place] or [phone]    Mobile Crisis Teams Organization         Address  Phone  Notes  [facility], Mobile Crisis Care Unit  [phone]   [facility]  [address] [place], [place]   [place] [address], [place] [phone]    Self-Help/Support Groups Organization         Address  Phone             Notes  [place]. of [place] - variety of support groups  [place] Call for more information  Narcotics Anonymous (NA), [facility] [address],  [place]   2 meetings at this location   Residential Treatment Programs Organization         Address  Phone  Notes  [facility] [address]    [place]  [phone]   [facility]  [address] [place] [id number], [place], [place]   [place], [place] [phone] Admissions: 8am-3pm M-F  Incentives Substance [place] [address]    [place], [place] [phone]   [facility] [address] [place], [place], [place]   [facility] [address]  [place], [place]   [facility] - Intensive Outpatient [address] Kristeen [address], [place], [place]   [facility] [place]  [place], [place] [phone] or [phone]   [facility] (RTS) [address], [place], [place] Accepts Medicaid  [facility] [address]  [place] [phone] Substance Abuse/Addiction Treatment   [facility] Organization         Address  Phone  Notes  [facility]  [phone]   Domenic Schwab, PhD [address] Arlis Porta [place], [place]   [phone] or [phone]   Liborio Negron Torres [place], [place] [phone]   [facility] [address] [place], [place] [phone] Insurance/Medicaid/sponsorship through [facility] and Families [address] [place]                                    [place], [place] [phone] [place] [address], [place] [phone]    Dr. Adele Schilder  [phone]   [facility] Dept. 1) [address]  [place] 2) [place] 3)  [place] [address], [place] [phone]  [phone]   [place] [phone] or [phone] (After Hours)

## 2013-12-14 NOTE — ED Notes (Signed)
Patient ambulated to restroom.

## 2013-12-14 NOTE — ED Notes (Signed)
Patient states she got UTI about a week and a half ago.  She states she tried to drink a lot of cranberry juice and she took some penicillin she had at home.   Patient states she got a yeast infection from the penicillin.  Patient complains of flank pain and lower abdominal pain.

## 2013-12-15 LAB — URINE CULTURE

## 2013-12-15 LAB — GC/CHLAMYDIA PROBE AMP
CT PROBE, AMP APTIMA: NEGATIVE
GC Probe RNA: NEGATIVE

## 2013-12-16 ENCOUNTER — Telehealth (HOSPITAL_COMMUNITY): Payer: Self-pay | Admitting: Emergency Medicine

## 2013-12-16 LAB — URINE CULTURE: Colony Count: >=100000

## 2013-12-17 NOTE — ED Notes (Signed)
Post ED Visit - Positive Culture Follow-up: Successful Patient Follow-Up  Culture assessed and recommendations reviewed by: []  Wes Ferron, Pharm.D., BCPS [x]  Heide Guile, Pharm.D., BCPS []  Alycia Rossetti, Pharm.D., BCPS []  [place], Pharm.D., BCPS, AAHIVP []  Legrand Como, Pharm.D., BCPS, AAHIVP  Positive urine culture  [x]  Patient discharged without antimicrobial prescription and treatment is now indicated []  Organism is resistant to prescribed ED discharge antimicrobial []  Patient with positive blood cultures  Changes discussed with ED provider: Clayton Bibles PA-C New antibiotic prescription: Cipro 500 mg PO BID x 14 days    [name] 12/17/2013, 3:28 PM

## 2013-12-17 NOTE — Progress Notes (Signed)
ED Antimicrobial Stewardship Positive Culture Follow Up   [name] is an 38 y.o. female who presented to [facility] on 12/14/2013 with a chief complaint of  Chief Complaint  Patient presents with  . Urinary Tract Infection    Recent Results (from the past 720 hour(s))  URINE CULTURE     Status: None   Collection Time    12/13/13  7:00 PM      Result Value Range Status   Specimen Description URINE, CLEAN CATCH   Final   Special Requests NONE   Final   Culture  Setup Time     Final   Value: 12/14/2013 01:11     Performed at [facility]     Final   Value: >=100,000 COLONIES/ML     Performed at [employer] [facility]   Culture     Final   Value: ESCHERICHIA COLI     Performed at [facility]   Report Status 12/15/2013 FINAL   Final   Organism ID, Bacteria ESCHERICHIA COLI   Final  URINE CULTURE     Status: None   Collection Time    12/14/13  8:50 AM      Result Value Range Status   Specimen Description URINE, CLEAN CATCH   Final   Special Requests NONE   Final   Culture  Setup Time     Final   Value: 12/14/2013 10:30     Performed at [facility]     Final   Value: >=100,000 COLONIES/ML     Performed at [facility]   Culture     Final   Value: ESCHERICHIA COLI     Performed at [facility]   Report Status 12/16/2013 FINAL   Final   Organism ID, Bacteria ESCHERICHIA COLI   Final  WET PREP, GENITAL     Status: None   Collection Time    12/14/13 10:58 AM      Result Value Range Status   Yeast Wet Prep HPF POC NONE SEEN  NONE SEEN Final   Trich, Wet Prep NONE SEEN  NONE SEEN Final   Clue Cells Wet Prep HPF POC NONE SEEN  NONE SEEN Final   WBC, Wet Prep HPF POC NONE SEEN  NONE SEEN Final  GC/CHLAMYDIA PROBE AMP     Status: None   Collection Time    12/14/13 10:58 AM      Result Value Range Status   CT Probe RNA NEGATIVE  NEGATIVE Final   GC Probe RNA NEGATIVE  NEGATIVE Final   Comment:  (NOTE)                                                                                               **Normal Reference Range: Negative**          Assay performed using the Gen-Probe APTIMA COMBO2 (R) Assay.     Acceptable specimen types for this assay include APTIMA Swabs (Unisex,     endocervical, urethral, or vaginal), first void urine, and ThinPrep  liquid based cytology samples.     Performed at [facility]    [x]  Treated with Bactrim, organism resistant to prescribed antimicrobial []  Patient discharged originally without antimicrobial agent and treatment is now indicated  New antibiotic prescription: Ciprofloxacin 500mg  po BID x 14 days  ED Provider: Clayton Bibles, PA-C   Candie Mile 12/17/2013, 10:17 AM Infectious Diseases Pharmacist Phone# [phone]

## 2013-12-19 NOTE — ED Provider Notes (Signed)
  Medical screening examination/treatment/procedure(s) were performed by non-physician practitioner and as supervising physician I was immediately available for consultation/collaboration.      Nakeesha Bowler, MD 12/19/13 1119 

## 2013-12-21 NOTE — ED Notes (Signed)
Unable to contact via phone.'Letter sent to EPIC address. 

## 2014-02-06 ENCOUNTER — Emergency Department (INDEPENDENT_AMBULATORY_CARE_PROVIDER_SITE_OTHER)
Admission: EM | Admit: 2014-02-06 | Discharge: 2014-02-06 | Disposition: A | Discharge status: Home / Self Care | Payer: Self-pay | Source: Home / Self Care | Attending: Family Medicine | Admitting: Family Medicine

## 2014-02-06 ENCOUNTER — Emergency Department (HOSPITAL_COMMUNITY)
Admission: EM | Admit: 2014-02-06 | Discharge: 2014-02-06 | Disposition: A | Discharge status: Home / Self Care | Payer: Self-pay | Attending: Emergency Medicine | Admitting: Emergency Medicine

## 2014-02-06 ENCOUNTER — Encounter (HOSPITAL_COMMUNITY): Payer: Self-pay | Admitting: Emergency Medicine

## 2014-02-06 DIAGNOSIS — I10 Essential (primary) hypertension: Secondary | ICD-10-CM | POA: Insufficient documentation

## 2014-02-06 DIAGNOSIS — F17200 Nicotine dependence, unspecified, uncomplicated: Secondary | ICD-10-CM | POA: Insufficient documentation

## 2014-02-06 DIAGNOSIS — R110 Nausea: Secondary | ICD-10-CM | POA: Insufficient documentation

## 2014-02-06 DIAGNOSIS — Z8619 Personal history of other infectious and parasitic diseases: Secondary | ICD-10-CM | POA: Insufficient documentation

## 2014-02-06 DIAGNOSIS — Z8659 Personal history of other mental and behavioral disorders: Secondary | ICD-10-CM | POA: Insufficient documentation

## 2014-02-06 DIAGNOSIS — H4710 Unspecified papilledema: Secondary | ICD-10-CM

## 2014-02-06 DIAGNOSIS — Q513 Bicornate uterus: Secondary | ICD-10-CM | POA: Insufficient documentation

## 2014-02-06 DIAGNOSIS — Z79899 Other long term (current) drug therapy: Secondary | ICD-10-CM | POA: Insufficient documentation

## 2014-02-06 DIAGNOSIS — R51 Headache: Secondary | ICD-10-CM | POA: Insufficient documentation

## 2014-02-06 DIAGNOSIS — R519 Headache, unspecified: Secondary | ICD-10-CM

## 2014-02-06 DIAGNOSIS — Z8719 Personal history of other diseases of the digestive system: Secondary | ICD-10-CM | POA: Insufficient documentation

## 2014-02-06 DIAGNOSIS — J45909 Unspecified asthma, uncomplicated: Secondary | ICD-10-CM | POA: Insufficient documentation

## 2014-02-06 LAB — I-STAT CREATININE, ED: CREATININE: 0.8 mg/dL (ref 0.50–1.10)

## 2014-02-06 MED ORDER — LISINOPRIL-HYDROCHLOROTHIAZIDE 20-12.5 MG PO TABS: 1.0000 | ORAL_TABLET | Freq: Every day | ORAL | Status: DC

## 2014-02-06 MED ORDER — PROCHLORPERAZINE EDISYLATE 5 MG/ML IJ SOLN
10.0000 mg | Freq: Once | INTRAMUSCULAR | Status: AC
  Administered 2014-02-06: 10 mg via INTRAVENOUS
  Filled 2014-02-06: qty 2

## 2014-02-06 MED ORDER — LORAZEPAM 1 MG PO TABS
1.0000 mg | ORAL_TABLET | Freq: Once | ORAL | Status: AC
  Administered 2014-02-06: 1 mg via ORAL
  Filled 2014-02-06: qty 1

## 2014-02-06 MED ORDER — DIPHENHYDRAMINE HCL 50 MG/ML IJ SOLN
25.0000 mg | Freq: Once | INTRAMUSCULAR | Status: AC
  Administered 2014-02-06: 25 mg via INTRAVENOUS
  Filled 2014-02-06: qty 1

## 2014-02-06 MED ORDER — SODIUM CHLORIDE 0.9 % IV BOLUS (SEPSIS)
1000.0000 mL | Freq: Once | INTRAVENOUS | Status: AC
  Administered 2014-02-06: 1000 mL via INTRAVENOUS

## 2014-02-06 MED ORDER — LABETALOL HCL 200 MG PO TABS
200.0000 mg | ORAL_TABLET | Freq: Once | ORAL | Status: AC
  Administered 2014-02-06: 200 mg via ORAL
  Filled 2014-02-06: qty 1

## 2014-02-06 NOTE — ED Provider Notes (Signed)
[name] is a 38 y.o. female who presents to Urgent Care today for headache. Patient has a history of hypertension that is not well-controlled. She has been out of her labetalol for about a month now. She developed headache yesterday. She notes severe headache associated with some dizziness. She denies any slurred speech weakness or numbness or syncope. She denies any vision changes. She was feeling poorly at work today and had her blood pressure taken which showed 213/138.  She has not been able to get her blood pressure medication so that she does not have health insurance or a primary care provider.  She denies any chest pains or palpitations leg swelling or weakness.   Past Medical History  Diagnosis Date  . Anxiety   . GERD (gastroesophageal reflux disease)   . STD (female)     HX of STD's  . Abnormal Pap smear 1998    had colpo  . 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 Bicornuate uterus   . Hypertension 2010  . Bicornate uterus   . Headache(784.0)   . Chlamydia 2000  . IUP (intrauterine pregnancy), incidental 13 weeks  . PONV (postoperative nausea and vomiting)   . Asthma     ,as child only   no inhaler  . HCV infection     HX of Hep C   History  Substance Use Topics  . Smoking status: Current Every Day Smoker -- 1.00 packs/day for 20 years    Types: Cigarettes  . Smokeless tobacco: Never Used     Comment: nicotine patch currently  . Alcohol Use: No     Comment: sober since 01/22/11   ROS as above Medications: No current facility-administered medications for this encounter.   Current Outpatient Prescriptions  Medication Sig Dispense Refill  . labetalol (NORMODYNE) 100 MG tablet Take 200 mg by mouth 2 (two) times daily.        Exam:  BP 196/150  Pulse 101  Temp(Src) 98.5 F (36.9 C) (Oral)  Resp 18  SpO2 98%  LMP 01/23/2014 Gen: Well NAD HEENT: EOMI,  MMM papilledema present bilaterally. PERRLA. Lungs: Normal work of breathing.  CTABL Heart: RRR no MRG Abd: NABS, Soft. NT, ND Exts: Brisk capillary refill, warm and well perfused.  Neuro: Alert and oriented cranial nerves II through XII are intact. Normal coordination strength and sensation. Very mildly impaired Romberg balance test.   Twelve-lead EKG shows normal sinus rhythm at 90 beats per minute. No ST segment elevation or depression. Unchanged from prior EKG. Possible left axis deviation.   Assessment and Plan: 38 y.o. female with hypertension emergency. Patient has significantly elevated blood pressure with headache and papilledema. Plan to transfer to the emergency room for further evaluation and management. Ideally I would prefer ambulance however patient refuses. We'll transfer via shuttle ASAP.  Discussed warning signs or symptoms. Please see discharge instructions. Patient expresses understanding.    Gregor Hams, MD 02/06/14 [phone]

## 2014-02-06 NOTE — ED Notes (Signed)
Reports having htn, went to ucc today due to headache, dizziness and leg pains. Hx of htn but has been out of meds x 1 month. bp 208/136 at triage.

## 2014-02-06 NOTE — ED Notes (Signed)
C/o high blood pressure Has been out of medication for a month now

## 2014-02-06 NOTE — ED Provider Notes (Signed)
Patient with headache gradual onset right-sided temporoparietal occipital onset yesterday she denies visual changes. Denies lightheadedness. Denies other complaint. She has been noncompliant with blood pressure medicine times one month. She has been getting similar headaches since her teenage years. Gets headaches approximately once per month headache is throbbing quality associated symptoms include nausea. No treatment prior to coming here On exam she is in no distress [place] to come score 15. HEENT exam no facial asymmetry is equal round react to light discs sharp extraocular muscles intact neurologically Glasgow Coma Score 15 cranial nerves II through XII extremity is well gaitnormal  Orlie Dakin, MD 02/06/14 2129

## 2014-02-06 NOTE — Discharge Instructions (Signed)
°Emergency Department Resource Guide °1) Find a Doctor and Pay Out of Pocket °Although you won't have to find out who is covered by your insurance plan, it is a good idea to ask around and get recommendations. You will then need to call the office and see if the doctor you have chosen will accept you as a new patient and what types of options they offer for patients who are self-pay. Some doctors offer discounts or will set up payment plans for their patients who do not have insurance, but you will need to ask so you aren't surprised when you get to your appointment. ° °2) Contact Your Local Health Department °Not all health departments have doctors that can see patients for sick visits, but many do, so it is worth a call to see if yours does. If you don't know where your local health department is, you can check in your phone book. The CDC also has a tool to help you locate your state's health department, and many state websites also have listings of all of their local health departments. ° °3) Find a Walk-in Clinic °If your illness is not likely to be very severe or complicated, you may want to try a walk in clinic. These are popping up all over the country in pharmacies, drugstores, and shopping centers. They're usually staffed by nurse practitioners or physician assistants that have been trained to treat common illnesses and complaints. They're usually fairly quick and inexpensive. However, if you have serious medical issues or chronic medical problems, these are probably not your best option. ° °No Primary Care Doctor: °- Call [facility] at  832-8000 - they can help you locate a primary care doctor that  accepts your insurance, provides certain services, etc. °- Physician Referral Service- 1-800-533-3463 ° °Chronic Pain Problems: °Organization         Address  Phone   Notes  °[facility]  (336) 297-2271 Patients need to be referred by their primary care doctor.  ° °Medication  Assistance: °Organization         Address  Phone   Notes  °[place] [facility] [address] °[place], [place] [address] (336) 641-8030 --Must be a resident of [place] °-- Must have NO insurance coverage whatsoever (no Medicaid/ Medicare, etc.) °-- The pt. MUST have a primary care doctor that directs their care regularly and follows them in the community °  °[facility]  (866) 331-1348   °[employer]  (888) 892-1162   ° °Agencies that provide inexpensive medical care: °Organization         Address  Phone   Notes  °[facility]  (336) 832-8035   °[facility]    (336) 832-7272   °[facility] [address] °[place], [place] [address] (336) 832-4777   °[facility] [address], °[place] (336) 271-4999   °[facility]    (336) 373-0678   °[facility]    (336) 272-1050   °[facility] ° [address], [place] Phone:  (336) 832-4444, Fax:  (336) 832-4440 Hours of Operation:  9 am - 6 pm, M-F.  Also accepts Medicaid/Medicare and self-pay.  °[facility] ° [address], [place] Phone: (336) 832-3150, Fax: (336) 832-3151. Hours of Operation:  8:30 am - 5:30 pm, M-F.  Also accepts Medicaid and self-pay.  °[facility] [place] [address], [place] Phone: (336) 878-6027   °[facility] [address], [place], [place] (336)723-1848, Ext. 123 Mondays & Thursdays: 7-9 AM.  First 15 patients are seen on a first come, first serve basis. °  ° °Medicaid-accepting [place] Providers: ° °Organization         Address  Phone   Notes  °[facility] [address], [place] (336) 641-2100 Also accepts self-pay patients.  °[facility] [address], [place] ° (336) 856-9996   °[facility] [address], [place]  (336) 288-8857   °[facility] [address] [place] [address], [place] (336) 299-7000   °Veita Bland [address], [place]  ° (336) 373-1557 Only accepts [place] Access Medicaid patients after they have their name applied to their card.  ° °Self-Pay (no insurance) in [place]: ° °Organization         Address  Phone   Notes  °Sickle Cell Patients, [facility] [address], [place] (336) 832-1970   °[facility] [address], [place] (336) 832-4400   °[facility] [place] [facility] ° [address], [place] (336) 992-4800   °[facility]/Dr. Osei-Bonsu ° [address] [place] [address], [place] or [address], [place] (336) 841-8500 Phone number for both [place] and [place] locations is the same.  °[facility] [address], [place] (336) 299-0000   °[facility] [address] [place] [address], [place] or [address] (336) 852-7530 °(336) 878-2260   °[facility] [address],  (336) 350-1642, phone; (336) 294-5005, fax Sees patients 1st and 3rd Saturday of every month.  Must not qualify for public or private insurance (i.e. Medicaid, Medicare, [place] Health Choice, Veterans' Benefits) • Household income should be no more than 200% of the poverty level •The clinic cannot treat you if you are pregnant or think you are pregnant • Sexually transmitted diseases are not treated at the clinic.  ° ° °Dental Care: °Organization         Address  Phone  Notes  °[place] [facility] [address],  (336) 641-6152 Accepts children up to age 21 who are enrolled in Medicaid or [place] Health Choice; pregnant women with a Medicaid card; and children who have applied for Medicaid or [place] Health Choice, but were declined, whose parents can pay a reduced fee at time of service.  °[place]  [facility] [place]  [address], [place] (336) 641-7733 Accepts children up to age 21 who are enrolled in Medicaid or [place] Health Choice; pregnant women with a Medicaid card; and children who have applied for Medicaid or  Health Choice, but were declined, whose parents can pay a reduced fee at time of service.  °[facility] ° [address],  (336) 641-4533 Patients are seen by appointment only. Walk-ins are not accepted. [facility] will see patients 18 years of age and older. °Monday - Tuesday (8am-5pm) °Most Wednesdays (8:30-5pm) °$30 per visit, cash only  °[facility] ° [address], [place] (336) 641-4533 Patients are seen by appointment only. Walk-ins are not accepted. [facility] will see patients 18 years of age and older. °One   Wednesday Evening (Monthly: Volunteer Based).  $30 per visit, cash only  °[facility]  (919) 537-3737 for adults; Children under age 4, call Graduate Pediatric Dentistry at (919) [phone]. Children aged 4-14, please call (919) [phone] to request a pediatric application. ° Dental services are provided in all areas of dental care including fillings, crowns and bridges, complete and partial dentures, implants, gum treatment, root canals, and extractions. Preventive care is also provided. Treatment is provided to both adults and children. °Patients are selected via a lottery and there is often a waiting list. °  °[facility] [address], °[place] ° (336) 763-8833 [email or web address] °  °[facility] [address], [place], [place] (336)723-1848, Ext. 123 Second and Fourth Thursday of each month, opens at 6:30 AM; Clinic ends at 9 AM.  Patients are seen on a first-come first-served basis, and a limited number are seen during each clinic.  ° °[facility] ° [address], [place], [place] (336) 723-7904    Eligibility Requirements °You must have lived in [place], [place], or [place] counties for at least the last three months. °  You cannot be eligible for state or federal sponsored healthcare insurance, including Veterans Administration, Medicaid, or Medicare. °  You generally cannot be eligible for healthcare insurance through your employer.  °  How to apply: °Eligibility screenings are held every Tuesday and Wednesday afternoon from 1:00 pm until 4:00 pm. You do not need an appointment for the interview!  °[facility] [address], [place], [place] 336-631-2330   °[facility] [facility]  336-342-8273   °[facility]  336-703-3100   ° [facility]  336-570-6415   ° °Behavioral Health Resources in the Community: °Intensive Outpatient Programs °Organization         Address  Phone  Notes  °[facility] [address], [place], [place] 336-878-6098   °[facility] [address], [place], [place] 336-832-9800   °[facility] [address], [place], [place] ° 336-882-2125   °[facility] [address],  °[place], [place] 1-800-853-5163 or 336-641-4981   °Substance Abuse Resources °Organization         Address  Phone  Notes  °[facility]  336-882-2125   °[facility]  336-784-9470   °[facility]  336-285-9073   °[facility]  336-845-3988   °Residential & Outpatient Substance Abuse Program  1-800-659-3381   °Psychological Services °Organization         Address  Phone  Notes  °[facility]  336- 832-9600   °[facility]  336- 378-7881   °[facility] [address], [place] 1-800-853-5163 or 336-641-4981   ° °Mobile Crisis Teams °Organization         Address  Phone  Notes  °[facility], Mobile Crisis Care Unit  1-877-626-1772   °Assertive °[facility] ° [address]  [place], [place] 336-834-9664   °Sharon DeEsch [address] °[place] 336-554-5454   ° °Self-Help/Support Groups °Organization         Address  Phone             Notes  °[facility] - variety of support groups  336- 373-1402 Call for more information  °Narcotics Anonymous (NA), [facility] [address], °[place] [facility] [place]  2 meetings at this location  ° °  Residential Treatment Programs °Organization         Address  Phone  Notes  °[facility] [address],    °[place]  1-866-801-8205   °[facility] ° [address], [place], [place] 704-293-8524   °[facility] [address], [place] 336-845-3988 Admissions: 8am-3pm M-F  °[facility] [address]    °[place], [place] 336-841-1104   °[facility] [address], [place], [place] 336-379-7146   °[facility] [address]  °[place], [place] 336-285-9073   °[facility] Intensive Outpatient [address], [place], [place] 336-852-3033   °[facility] [address]  °[place], [place] 1-877-615-2722 or 336-784-9470   °[facility] [address] [place], [place] 336-227-7417 Accepts Medicaid  °[facility] [address]  ° [place] 1-800-659-3381 Substance Abuse/Addiction Treatment  ° °[place] Behavioral Health Resources °Organization         Address  Phone  Notes  °[facility]  (888) 581-9988   °Julie Brannon, PhD [address] [place], [place]   (336) 349-5553 or (336) 951-0000   °[facility]   [address] °[place], [place] (336) 349-4454   °[facility] [address], [place], [place] (336) 342-8316 Insurance/Medicaid/sponsorship through [facility]  °[facility] [address]                                    [place], [place] (336) 342-8316 Therapy/tele-psych/case    °[facility] [address]  ° [place], [place] (336) 349-2233    °Dr. Arfeen  (336) 349-4544   °[facility] [place]  [employer] [place] [facility] 1) [address],  °2) [address], [place] °3)  [address], [place] (336) 349-3220 °(336) 342-7768 ° °(336) 342-8140   °[place] [facility] (336) 342-1394 or (336) 342-3537 (After Hours)    ° ° °

## 2014-02-13 NOTE — ED Provider Notes (Signed)
CSN: [id number]     Arrival date & time 02/06/14  1711 History   First MD Initiated Contact with Patient 02/06/14 1900     Chief Complaint  Patient presents with  . Hypertension   HPI [name] is a 38 y.o. female with h/o HTN presents complaining of uncontrolled high blood pressure.    She presented to urgent care earlier today complaining of headache.  There, her BP was noted to be 213/138, and she was sent here for evaluation.  With regard to her headache, it is improving on arrival.  It was gradual in onset, at rest.  It was moderate in severity, now relatively mild.  It was located to the right temporal and periorbital region.  It was throbbing in nature. It was associated with nausea.  It was similar to headaches that she has had with moderate frequency for many years; "since I was a teenager."  It is has not been associated with visual changes, vertigo, weakness, numbness, paresthesias, difficulty with gait or coordination, difficulty with speech, neck stiffness, fever,altered mental status, chest pain, or other complaints.   With regard to her blood pressure, she has been non-compliant with her blood pressure medicine for one month.  She has previously been on labetalol, which she was placed on for hypertension during pregnancy remotely.  She ran out recently, and she does not have a primary care physician to provider refills. She does not routinely check her blood pressure.  As detailed above, she has no associated neurological symptoms.  She has no visual changes.  She has no chest pain.  She also has no shortness of breath, edema, or decreased urine output.     Past Medical History  Diagnosis Date  . Anxiety   . GERD (gastroesophageal reflux disease)   . STD (female)     HX of STD's  . Abnormal Pap smear 1998    had colpo  . 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 Bicornuate uterus   . Hypertension 2010  . Bicornate uterus   . Headache(784.0)   . Chlamydia  2000  . IUP (intrauterine pregnancy), incidental 13 weeks  . PONV (postoperative nausea and vomiting)   . Asthma     ,as child only   no inhaler  . HCV infection     HX of Hep C   Past Surgical History  Procedure Laterality Date  . Therapeutic abortion    . Therapeutic abortion    . Tonsillectomy    . Dilation and curettage of uterus      SAB  . Finger surgery  2003    reattachment of right forefinger  . Cervical cerclage  09/01/2011    Procedure: CERCLAGE CERVICAL;  Surgeon: Donnamae Jude, MD;  Location: [facility];  Service: Gynecology;  Laterality: N/A;  . Cervical cerclage  06/28/2012    Procedure: CERCLAGE CERVICAL;  Surgeon: Donnamae Jude, MD;  Location: [facility];  Service: Gynecology;  Laterality: N/A;  . Bilateral salpingectomy  12/06/2012    Procedure: BILATERAL SALPINGECTOMY;  Surgeon: Mora Bellman, MD;  Location: [facility];  Service: Gynecology;  Laterality: Bilateral;  laparoscopic   Family History  Problem Relation Age of Onset  . Hypertension Mother   . Thyroid disease Maternal Grandmother   . Diabetes Paternal Grandfather   . Anesthesia problems Neg Hx   . Other Neg Hx    History  Substance Use Topics  . Smoking status: Current Every Day Smoker --  1.00 packs/day for 20 years    Types: Cigarettes  . Smokeless tobacco: Never Used     Comment: nicotine patch currently  . Alcohol Use: No     Comment: sober since 01/22/11   OB History   Grav Para Term Preterm Abortions TAB SAB Ect Mult Living   7 4 2 2 3 2 1  0 0 2     Review of Systems  Constitutional: Negative for fever, chills and diaphoresis.  HENT: Negative for congestion and rhinorrhea.   Respiratory: Negative for cough, shortness of breath and wheezing.   Cardiovascular: Negative for chest pain and leg swelling.  Gastrointestinal: Negative for nausea, vomiting, abdominal pain and diarrhea.  Genitourinary: Negative for dysuria, urgency, frequency, flank pain, vaginal bleeding, vaginal discharge and difficulty  urinating.  Musculoskeletal: Negative for neck pain and neck stiffness.  Skin: Negative for rash.  Neurological: Positive for headaches. Negative for weakness and numbness.  All other systems reviewed and are negative.      Allergies  Vicodin  Home Medications   Current Outpatient Rx  Name  Route  Sig  Dispense  Refill  . acetaminophen (TYLENOL) 500 MG tablet   Oral   Take 500 mg by mouth every 6 (six) hours as needed for moderate pain.         Marland Kitchen ibuprofen (ADVIL,MOTRIN) 200 MG tablet   Oral   Take 800 mg by mouth daily as needed for moderate pain.         Marland Kitchen labetalol (NORMODYNE) 100 MG tablet   Oral   Take 200 mg by mouth 2 (two) times daily.         Marland Kitchen lisinopril-hydrochlorothiazide (PRINZIDE,ZESTORETIC) 20-12.5 MG per tablet   Oral   Take 1 tablet by mouth daily.   90 tablet   0    BP 179/128  Pulse 98  Temp(Src) 98 F (36.7 C) (Oral)  Resp 21  Wt 157 lb 12.8 oz (71.578 kg)  SpO2 99%  LMP 01/23/2014 Physical Exam  Nursing note and vitals reviewed. Constitutional: She appears well-developed and well-nourished. No distress.  HENT:  Head: Normocephalic and atraumatic.  Mouth/Throat: Oropharynx is clear and moist.  Eyes: Conjunctivae and EOM are normal. Pupils are equal, round, and reactive to light. No scleral icterus.  Fundoscopic exam:      The right eye shows no hemorrhage and no papilledema.       The left eye shows no hemorrhage and no papilledema.  Neck: Normal range of motion. No JVD present.  Cardiovascular: Normal rate, regular rhythm, normal heart sounds and intact distal pulses.  Exam reveals no gallop and no friction rub.   No murmur heard. Pulmonary/Chest: Effort normal and breath sounds normal. No respiratory distress. She has no wheezes. She has no rales.  Abdominal: Soft. She exhibits no distension. There is no tenderness. There is no rebound and no guarding.  Musculoskeletal: She exhibits no edema.  Neurological:  Alert, oriented X 4,  GCS 15.  CN 2-12 intact.  5/5 strength in bilateral upper and lower extremities, all major muscle groups.  Normal sensation to light touch in bilateral upper and lower extremities.  Visual fields intact to confrontation.  Normal gait.  Negative Romberg.  Normal coordination.  Normal finger to nose and heel to shin.  Skin: Skin is warm and dry.  Psychiatric: She has a normal mood and affect.    ED Course  Procedures (including critical care time) Labs Review Labs Reviewed  I-STAT CREATININE, ED  Imaging Review No results found.   EKG Interpretation None      MDM   38 yo F presents with 1. Headache in the context of long-standing, chronic migrainous headache history; and 2. Hypertension; she has been non-compliant with her antihypertnsive (labetalol).  Headache: her headache is migrainous in nature.  Mild to moderate.  Similar to headaches she has had for years.  Gradual in onset.  With no fever, meningismus, altered mental status, visual loss or other eye symptoms, or other features suggestive of CNS infection, SAH, IPH, Venous sinus thrombosis, or other emergent cause of her headaches.  She has a normal neuro exam, no papilledema.  I do not feel imaging is warranted.  I do not attribute her headache to hypertensive encephalopathy.  Treated with migraine cocktail.  On reevaluation, headache resolved.  Medications  LORazepam (ATIVAN) tablet 1 mg (1 mg Oral Given 02/06/14 1938)  prochlorperazine (COMPAZINE) injection 10 mg (10 mg Intravenous Given 02/06/14 2044)  diphenhydrAMINE (BENADRYL) injection 25 mg (25 mg Intravenous Given 02/06/14 2041)  sodium chloride 0.9 % bolus 1,000 mL (0 mLs Intravenous Stopped 02/06/14 2130)  labetalol (NORMODYNE) tablet 200 mg (200 mg Oral Given 02/06/14 2025)   Hypertension: secondary to medication non-compliance.  No signs of end organ failure.  Normal creatinine.  No papilledema.  No peripheral edema.  Chest CTAB.  No chest pain.  Gave dose of IV  labetalol.  Stable for DC home.  Initiated therapy with RX for lisinopril HCTZ.  I gave ED return precautions for both HA and HTN.  Gave her resource guide to help establish PCP and stressed importance of PCP followup to evaluate response to treatment, for possible medication side effect, further risk stratification for end organ damage in the long run, and general health maintenance.  Final diagnoses:  Hypertension  Headache      Wendall Papa, MD 02/14/14 1337

## 2014-02-15 NOTE — ED Provider Notes (Signed)
I have personally seen and examined the patient.  I have discussed the plan of care with the resident.  I have reviewed the documentation on PMH/FH/Soc. History.  I have reviewed the documentation of the resident and agree.  Orlie Dakin, MD 02/15/14 1200

## 2014-03-01 ENCOUNTER — Emergency Department (INDEPENDENT_AMBULATORY_CARE_PROVIDER_SITE_OTHER)
Admission: EM | Admit: 2014-03-01 | Discharge: 2014-03-01 | Disposition: A | Discharge status: Home / Self Care | Payer: Self-pay | Source: Home / Self Care | Attending: Emergency Medicine | Admitting: Emergency Medicine

## 2014-03-01 ENCOUNTER — Encounter (HOSPITAL_COMMUNITY): Payer: Self-pay | Admitting: Emergency Medicine

## 2014-03-01 DIAGNOSIS — J00 Acute nasopharyngitis [common cold]: Secondary | ICD-10-CM

## 2014-03-01 MED ORDER — IPRATROPIUM BROMIDE 0.06 % NA SOLN: 2.0000 | Freq: Four times a day (QID) | NASAL | Status: DC

## 2014-03-01 NOTE — Discharge Instructions (Signed)

## 2014-03-01 NOTE — ED Notes (Signed)
Pt  Reports  Symptoms  Of           Of  Fever   Body  Aches          Headache  As  Well  As  Diarrhea       Symptoms  Started  yest      Pt  Sitting  Upright on  Exam table  Speaking in  Complete  sentances  And  Is  In no acute  Distress

## 2014-03-01 NOTE — ED Provider Notes (Signed)
Medical screening examination/treatment/procedure(s) were performed by non-physician practitioner and as supervising physician I was immediately available for consultation/collaboration.  Philipp Deputy, M.D.  Harden Mo, MD 03/01/14 [phone]

## 2014-03-01 NOTE — ED Provider Notes (Signed)
CSN: [id number]     Arrival date & time 03/01/14  1003 History   First MD Initiated Contact with Patient 03/01/14 1026     Chief Complaint  Patient presents with  . URI   (Consider location/radiation/quality/duration/timing/severity/associated sxs/prior Treatment) Patient is a 38 y.o. female presenting with URI. The history is provided by the patient.  URI Presenting symptoms: congestion, cough, fever and rhinorrhea   Presenting symptoms: no ear pain, no facial pain and no sore throat   Severity:  Moderate Onset quality:  Gradual Duration:  1 day Timing:  Constant Progression:  Unchanged Chronicity:  New Associated symptoms: myalgias   Associated symptoms: no headaches, no neck pain, no sneezing, no swollen glands and no wheezing     Past Medical History  Diagnosis Date  . Anxiety   . GERD (gastroesophageal reflux disease)   . STD (female)     HX of STD's  . Abnormal Pap smear 1998    had colpo  . 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 Bicornuate uterus   . Hypertension 2010  . Bicornate uterus   . Headache(784.0)   . Chlamydia 2000  . IUP (intrauterine pregnancy), incidental 13 weeks  . PONV (postoperative nausea and vomiting)   . Asthma     ,as child only   no inhaler  . HCV infection     HX of Hep C   Past Surgical History  Procedure Laterality Date  . Therapeutic abortion    . Therapeutic abortion    . Tonsillectomy    . Dilation and curettage of uterus      SAB  . Finger surgery  2003    reattachment of right forefinger  . Cervical cerclage  09/01/2011    Procedure: CERCLAGE CERVICAL;  Surgeon: Donnamae Jude, MD;  Location: [facility];  Service: Gynecology;  Laterality: N/A;  . Cervical cerclage  06/28/2012    Procedure: CERCLAGE CERVICAL;  Surgeon: Donnamae Jude, MD;  Location: [facility];  Service: Gynecology;  Laterality: N/A;  . Bilateral salpingectomy  12/06/2012    Procedure: BILATERAL SALPINGECTOMY;  Surgeon: Mora Bellman, MD;  Location: [facility];   Service: Gynecology;  Laterality: Bilateral;  laparoscopic   Family History  Problem Relation Age of Onset  . Hypertension Mother   . Thyroid disease Maternal Grandmother   . Diabetes Paternal Grandfather   . Anesthesia problems Neg Hx   . Other Neg Hx    History  Substance Use Topics  . Smoking status: Current Every Day Smoker -- 1.00 packs/day for 20 years    Types: Cigarettes  . Smokeless tobacco: Never Used     Comment: nicotine patch currently  . Alcohol Use: No     Comment: sober since 01/22/11   OB History   Grav Para Term Preterm Abortions TAB SAB Ect Mult Living   7 4 2 2 3 2 1  0 0 2     Review of Systems  Constitutional: Positive for fever.  HENT: Positive for congestion and rhinorrhea. Negative for ear pain, sneezing and sore throat.   Respiratory: Positive for cough. Negative for wheezing.   Musculoskeletal: Positive for myalgias. Negative for neck pain.  Neurological: Negative for headaches.  All other systems reviewed and are negative.   Allergies  Vicodin  Home Medications   Current Outpatient Rx  Name  Route  Sig  Dispense  Refill  . acetaminophen (TYLENOL) 500 MG tablet   Oral   Take 500 mg by  mouth every 6 (six) hours as needed for moderate pain.         Marland Kitchen ibuprofen (ADVIL,MOTRIN) 200 MG tablet   Oral   Take 800 mg by mouth daily as needed for moderate pain.         Marland Kitchen ipratropium (ATROVENT) 0.06 % nasal spray   Each Nare   Place 2 sprays into both nostrils 4 (four) times daily.   15 mL   0   . labetalol (NORMODYNE) 100 MG tablet   Oral   Take 200 mg by mouth 2 (two) times daily.         Marland Kitchen lisinopril-hydrochlorothiazide (PRINZIDE,ZESTORETIC) 20-12.5 MG per tablet   Oral   Take 1 tablet by mouth daily.   90 tablet   0    BP 145/102  Pulse 92  Temp(Src) 99.5 F (37.5 C) (Oral)  Resp 16  SpO2 97%  LMP 02/16/2014 Physical Exam  Nursing note and vitals reviewed. Constitutional: She is oriented to person, place, and time. She  appears well-developed and well-nourished. No distress.  HENT:  Head: Normocephalic and atraumatic.  Right Ear: Hearing, tympanic membrane, external ear and ear canal normal.  Left Ear: Hearing, tympanic membrane, external ear and ear canal normal.  Nose: Rhinorrhea present.  Mouth/Throat: Uvula is midline, oropharynx is clear and moist and mucous membranes are normal.  Eyes: Conjunctivae are normal. Right eye exhibits no discharge. Left eye exhibits no discharge. No scleral icterus.  Neck: Normal range of motion. Neck supple.  Cardiovascular: Normal rate, regular rhythm and normal heart sounds.   Pulmonary/Chest: Effort normal and breath sounds normal.  Musculoskeletal: Normal range of motion.  Lymphadenopathy:    She has no cervical adenopathy.  Neurological: She is alert and oriented to person, place, and time.  Skin: Skin is warm and dry. No rash noted.  Psychiatric: She has a normal mood and affect. Her behavior is normal.    ED Course  Procedures (including critical care time) Labs Review Labs Reviewed - No data to display Imaging Review No results found.   MDM   1. Common cold   Tylenol or ibuprofen, fluids, rest. Atrovent nasal spray  As prescribed. Symptomatic care.    [address], [place] 03/01/14 [phone]

## 2014-06-03 ENCOUNTER — Ambulatory Visit (INDEPENDENT_AMBULATORY_CARE_PROVIDER_SITE_OTHER): Payer: BC Managed Care – PPO | Admitting: Internal Medicine

## 2014-06-03 VITALS — BP 148/104 | HR 91 | Temp 97.8°F | Resp 20 | Ht 63.0 in | Wt 161.6 lb

## 2014-06-03 DIAGNOSIS — Z8619 Personal history of other infectious and parasitic diseases: Secondary | ICD-10-CM

## 2014-06-03 DIAGNOSIS — R51 Headache: Secondary | ICD-10-CM

## 2014-06-03 DIAGNOSIS — I10 Essential (primary) hypertension: Secondary | ICD-10-CM

## 2014-06-03 DIAGNOSIS — Z79899 Other long term (current) drug therapy: Secondary | ICD-10-CM

## 2014-06-03 LAB — COMPLETE METABOLIC PANEL WITH GFR
ALT: 23 U/L (ref 0–35)
AST: 20 U/L (ref 0–37)
Albumin: 4.7 g/dL (ref 3.5–5.2)
Alkaline Phosphatase: 55 U/L (ref 39–117)
BUN: 10 mg/dL (ref 6–23)
CALCIUM: 10.3 mg/dL (ref 8.4–10.5)
CHLORIDE: 102 meq/L (ref 96–112)
CO2: 27 mEq/L (ref 19–32)
Creat: 0.69 mg/dL (ref 0.50–1.10)
GFR, Est African American: >89 mL/min
GFR, Est Non African American: >89 mL/min
Glucose, Bld: 74 mg/dL (ref 70–99)
Potassium: 4.3 mEq/L (ref 3.5–5.3)
Sodium: 136 mEq/L (ref 135–145)
TOTAL PROTEIN: 7 g/dL (ref 6.0–8.3)
Total Bilirubin: 0.4 mg/dL (ref 0.2–1.2)

## 2014-06-03 LAB — TSH: TSH: 1.024 u[IU]/mL (ref 0.350–4.500)

## 2014-06-03 MED ORDER — LISINOPRIL-HYDROCHLOROTHIAZIDE 20-12.5 MG PO TABS: 1.0000 | ORAL_TABLET | Freq: Every day | ORAL | Status: DC

## 2014-06-03 NOTE — Progress Notes (Signed)
   Subjective:    Patient ID: [name], female    DOB: [birth year], 38 y.o.   MRN: [id number]  HPI 38 year old female complains of hypertension. She has a history of hypertension and complains of a bad headaches and leg pains. She ran out of her medication 1-2 weeks ago. She has been on lisinopril for a couple of months and needs a refill. These sxs are common when she stops her BP meds. No focal NMS loss by hx.    Review of Systems     Objective:   Physical Exam  Constitutional: She is oriented to person, place, and time. She appears well-developed and well-nourished. No distress.  HENT:  Head: Normocephalic.  Mouth/Throat: Oropharynx is clear and moist.  Eyes: EOM are normal. Pupils are equal, round, and reactive to light. No scleral icterus.  Neck: Normal range of motion. Neck supple. No thyromegaly present.  Cardiovascular: Normal rate, regular rhythm and normal heart sounds.   Pulmonary/Chest: Effort normal and breath sounds normal.  Musculoskeletal: Normal range of motion.  Lymphadenopathy:    She has no cervical adenopathy.  Neurological: She is alert and oriented to person, place, and time. No cranial nerve deficit. She exhibits normal muscle tone. Coordination normal.  Psychiatric: She has a normal mood and affect. Her behavior is normal. Judgment and thought content normal.   Labs ordered       Assessment & Plan:  HTN with HA Restart Lisinopril hctz Schedule primary care at 104  Hepatitis C by hx/needs eval also

## 2014-06-03 NOTE — Progress Notes (Signed)
   Subjective:    Patient ID: [name], female    DOB: 03/03/1976, 38 y.o.   MRN: [id number]  HPI    Review of Systems     Objective:   Physical Exam        Assessment & Plan:

## 2014-06-03 NOTE — Patient Instructions (Addendum)
DASH Eating Plan DASH stands for "Dietary Approaches to Stop Hypertension." The DASH eating plan is a healthy eating plan that has been shown to reduce high blood pressure (hypertension). Additional health benefits may include reducing the risk of type 2 diabetes mellitus, heart disease, and stroke. The DASH eating plan may also help with weight loss. WHAT DO I NEED TO KNOW ABOUT THE DASH EATING PLAN? For the DASH eating plan, you will follow these general guidelines:  Choose foods with a percent daily value for sodium of less than 5% (as listed on the food label).  Use salt-free seasonings or herbs instead of table salt or sea salt.  Check with your health care provider or pharmacist before using salt substitutes.  Eat lower-sodium products, often labeled as "lower sodium" or "no salt added."  Eat fresh foods.  Eat more vegetables, fruits, and low-fat dairy products.  Choose whole grains. Look for the word "whole" as the first word in the ingredient list.  Choose fish and skinless chicken or turkey more often than red meat. Limit fish, poultry, and meat to 6 oz (170 g) each day.  Limit sweets, desserts, sugars, and sugary drinks.  Choose heart-healthy fats.  Limit cheese to 1 oz (28 g) per day.  Eat more home-cooked food and less restaurant, buffet, and fast food.  Limit fried foods.  Cook foods using methods other than frying.  Limit canned vegetables. If you do use them, rinse them well to decrease the sodium.  When eating at a restaurant, ask that your food be prepared with less salt, or no salt if possible. WHAT FOODS CAN I EAT? Seek help from a dietitian for individual calorie needs. Grains Whole grain or whole wheat bread. Brown rice. Whole grain or whole wheat pasta. Quinoa, bulgur, and whole grain cereals. Low-sodium cereals. Corn or whole wheat flour tortillas. Whole grain cornbread. Whole grain crackers. Low-sodium crackers. Vegetables Fresh or frozen vegetables  (raw, steamed, roasted, or grilled). Low-sodium or reduced-sodium tomato and vegetable juices. Low-sodium or reduced-sodium tomato sauce and paste. Low-sodium or reduced-sodium canned vegetables.  Fruits All fresh, canned (in natural juice), or frozen fruits. Meat and Other Protein Products Ground beef (85% or leaner), grass-fed beef, or beef trimmed of fat. Skinless chicken or turkey. Ground chicken or turkey. Pork trimmed of fat. All fish and seafood. Eggs. Dried beans, peas, or lentils. Unsalted nuts and seeds. Unsalted canned beans. Dairy Low-fat dairy products, such as skim or 1% milk, 2% or reduced-fat cheeses, low-fat ricotta or cottage cheese, or plain low-fat yogurt. Low-sodium or reduced-sodium cheeses. Fats and Oils Tub margarines without trans fats. Light or reduced-fat mayonnaise and salad dressings (reduced sodium). Avocado. Safflower, olive, or canola oils. Natural peanut or almond butter. Other Unsalted popcorn and pretzels. The items listed above may not be a complete list of recommended foods or beverages. Contact your dietitian for more options. WHAT FOODS ARE NOT RECOMMENDED? Grains White bread. White pasta. White rice. Refined cornbread. Bagels and croissants. Crackers that contain trans fat. Vegetables Creamed or fried vegetables. Vegetables in a cheese sauce. Regular canned vegetables. Regular canned tomato sauce and paste. Regular tomato and vegetable juices. Fruits Dried fruits. Canned fruit in light or heavy syrup. Fruit juice. Meat and Other Protein Products Fatty cuts of meat. Ribs, chicken wings, bacon, sausage, bologna, salami, chitterlings, fatback, hot dogs, bratwurst, and packaged luncheon meats. Salted nuts and seeds. Canned beans with salt. Dairy Whole or 2% milk, cream, half-and-half, and cream cheese. Whole-fat or sweetened yogurt. Full-fat   cheeses or blue cheese. Nondairy creamers and whipped toppings. Processed cheese, cheese spreads, or cheese  curds. Condiments Onion and garlic salt, seasoned salt, table salt, and sea salt. Canned and packaged gravies. Worcestershire sauce. Tartar sauce. Barbecue sauce. Teriyaki sauce. Soy sauce, including reduced sodium. Steak sauce. Fish sauce. Oyster sauce. Cocktail sauce. Horseradish. Ketchup and mustard. Meat flavorings and tenderizers. Bouillon cubes. Hot sauce. Tabasco sauce. Marinades. Taco seasonings. Relishes. Fats and Oils Butter, stick margarine, lard, shortening, ghee, and bacon fat. Coconut, palm kernel, or palm oils. Regular salad dressings. Other Pickles and olives. Salted popcorn and pretzels. The items listed above may not be a complete list of foods and beverages to avoid. Contact your dietitian for more information. WHERE CAN I FIND MORE INFORMATION? National Heart, Lung, and Blood Institute: travelstabloid.com Document Released: 10/29/2011 Document Revised: 11/14/2013 Document Reviewed: 09/13/2013 [facility] Patient Information 2015 [place], [place]. This information is not intended to replace advice given to you by your health care provider. Make sure you discuss any questions you have with your health care provider. Hypertension Hypertension, commonly called high blood pressure, is when the force of blood pumping through your arteries is too strong. Your arteries are the blood vessels that carry blood from your heart throughout your body. A blood pressure reading consists of a higher number over a lower number, such as 110/72. The higher number (systolic) is the pressure inside your arteries when your heart pumps. The lower number (diastolic) is the pressure inside your arteries when your heart relaxes. Ideally you want your blood pressure below 120/80. Hypertension forces your heart to work harder to pump blood. Your arteries may become narrow or stiff. Having hypertension puts you at risk for heart disease, stroke, and other problems.  RISK  FACTORS Some risk factors for high blood pressure are controllable. Others are not.  Risk factors you cannot control include:   Race. You may be at higher risk if you are African American.  Age. Risk increases with age.  Gender. Men are at higher risk than women before age 46 years. After age 78, women are at higher risk than men. Risk factors you can control include:  Not getting enough exercise or physical activity.  Being overweight.  Getting too much fat, sugar, calories, or salt in your diet.  Drinking too much alcohol. SIGNS AND SYMPTOMS Hypertension does not usually cause signs or symptoms. Extremely high blood pressure (hypertensive crisis) may cause headache, anxiety, shortness of breath, and nosebleed. DIAGNOSIS  To check if you have hypertension, your health care provider will measure your blood pressure while you are seated, with your arm held at the level of your heart. It should be measured at least twice using the same arm. Certain conditions can cause a difference in blood pressure between your right and left arms. A blood pressure reading that is higher than normal on one occasion does not mean that you need treatment. If one blood pressure reading is high, ask your health care provider about having it checked again. TREATMENT  Treating high blood pressure includes making lifestyle changes and possibly taking medication. Living a healthy lifestyle can help lower high blood pressure. You may need to change some of your habits. Lifestyle changes may include:  Following the DASH diet. This diet is high in fruits, vegetables, and whole grains. It is low in salt, red meat, and added sugars.  Getting at least 2 1/2 hours of brisk physical activity every week.  Losing weight if necessary.  Not smoking.  Limiting alcoholic beverages.  Learning ways to reduce stress. If lifestyle changes are not enough to get your blood pressure under control, your health care provider  may prescribe medicine. You may need to take more than one. Work closely with your health care provider to understand the risks and benefits. HOME CARE INSTRUCTIONS  Have your blood pressure rechecked as directed by your health care provider.   Only take medicine as directed by your health care provider. Follow the directions carefully. Blood pressure medicines must be taken as prescribed. The medicine does not work as well when you skip doses. Skipping doses also puts you at risk for problems.   Do not smoke.   Monitor your blood pressure at home as directed by your health care provider. SEEK MEDICAL CARE IF:   You think you are having a reaction to medicines taken.  You have recurrent headaches or feel dizzy.  You have swelling in your ankles.  You have trouble with your vision. SEEK IMMEDIATE MEDICAL CARE IF:  You develop a severe headache or confusion.  You have unusual weakness, numbness, or feel faint.  You have severe chest or abdominal pain.  You vomit repeatedly.  You have trouble breathing. MAKE SURE YOU:   Understand these instructions.  Will watch your condition.  Will get help right away if you are not doing well or get worse. Document Released: 11/09/2005 Document Revised: 11/14/2013 Document Reviewed: 09/01/2013 [facility] Patient Information 2015 [place], [place]. This information is not intended to replace advice given to you by your health care provider. Make sure you discuss any questions you have with your health care provider. Hepatitis C Hepatitis C is a viral infection of the liver. Infection may go undetected for months or years because symptoms may be absent or very mild. Chronic liver disease is the main danger of hepatitis C. This may lead to scarring of the liver (cirrhosis), liver failure, and liver cancer. CAUSES  Hepatitis C is caused by the hepatitis C virus (HCV). Formerly, hepatitis C infections were most commonly transmitted through blood  transfusions. In the early 1990s, routine testing of donated blood for hepatitis C and exclusion of blood that tests positive for HCV began. Now, HCV is most commonly transmitted from person to person through injection drug use, sharing needles, or sex with an infected person. A caregiver may also get the infection from exposure to the blood of an infected patient by way of a cut or needle stick.  SYMPTOMS  Acute Phase Many cases of acute HCV infection are mild and cause few problems.Some people may not even realize they are sick.Symptoms in others may last a few weeks to several months and include:  Feeling very tired.  Loss of appetite.  Nausea.  Vomiting.  Abdominal pain.  Dark yellow urine.  Yellow skin and eyes (jaundice).  Itching of the skin. Chronic Phase  Between 50% to 85% of people who get HCV infection become "chronic carriers." They often have no symptoms, but the virus stays in their body.They may spread the virus to others and can get long-term liver disease.  Many people with chronic HCV infection remain healthy for many years. However, up to 1 in 5 chronically infected people may develop severe liver diseases including scarring of the liver (cirrhosis), liver failure, or liver cancer. DIAGNOSIS  Diagnosis of hepatitis C infection is made by testing blood for the presence of hepatitis C viral particles called RNA. Other tests may also be done to measure the status of  current liver function, exclude other liver problems, or assess liver damage. TREATMENT  Treatment with many antiviral drugs is available and recommended for some patients with chronic HCV infection. Drug treatment is generally considered appropriate for patients who:  Are 13 years of age or older.  Have a positive test for HCV particles in the blood.  Have a liver tissue sample (biopsy) that shows chronic hepatitis and significant scarring (fibrosis).  Do not have signs of liver failure.  Have  acceptable blood test results that confirm the wellness of other body organs.  Are willing to be treated and conform to treatment requirements.  Have no other circumstances that would prevent treatment from being recommended (contraindications). All people who are offered and choose to receive drug treatment must understand that careful medical follow up for many months and even years is crucial in order to make successful care possible. The goal of drug treatment is to eliminate any evidence of HCV in the blood on a long-term basis. This is called a "sustained virologic response" or SVR. Achieving a SVR is associated with a decrease in the chance of life-threatening liver problems, need for a liver transplant, liver cancer rates, and liver-related complications. Successful treatment currently requires taking treatment drugs for at least 24 weeks and up to 72 weeks. An injected drug (interferon) given weekly and an oral antiviral medicine taken daily are usually prescribed. Side effects from these drugs are common and some may be very serious. Your response to treatment must be carefully monitored by both you and your caregiver throughout the entire treatment period. PREVENTION There is no vaccine for hepatitis C. The only way to prevent the disease is to reduce the risk of exposure to the virus.   Avoid sharing drug needles or personal items like toothbrushes, razors, and nail clippers with an infected person.  Healthcare workers need to avoid injuries and wear appropriate protective equipment such as gloves, gowns, and face masks when performing invasive medical or nursing procedures. HOME CARE INSTRUCTIONS  To avoid making your liver disease worse:  Strictly avoid drinking alcohol.  Carefully review all new prescriptions of medicines with your caregiver. Ask your caregiver which drugs you should avoid. The following drugs are toxic to the liver, and your caregiver may tell you to avoid  them:  Isoniazid.  Methyldopa.  Acetaminophen.  Anabolic steroids (muscle-building drugs).  Erythromycin.  Oral contraceptives (birth control pills).  Check with your caregiver to make sure medicine you are currently taking will not be harmful.  Periodic blood tests may be required. Follow your caregiver's advice about when you should have blood tests.  Avoid a sexual relationship until advised otherwise by your caregiver.  Avoid activities that could expose other people to your blood. Examples include sharing a toothbrush, nail clippers, razors, and needles.  Bed rest is not necessary, but it may make you feel better. Recovery time is not related to the amount of rest you receive.  This infection is contagious. Follow your caregiver's instructions in order to avoid spread of the infection. SEEK IMMEDIATE MEDICAL CARE IF:  You have increasing fatigue or weakness.  You have an oral temperature above 102 F (38.9 C), not controlled by medicine.  You develop loss of appetite, nausea, or vomiting.  You develop jaundice.  You develop easy bruising or bleeding.  You develop any severe problems as a result of your treatment. MAKE SURE YOU:   Understand these instructions.  Will watch your condition.  Will get help right away  if you are not doing well or get worse. Document Released: 11/06/2000 Document Revised: 02/01/2012 Document Reviewed: 03/11/2011 [facility] Patient Information 2015 [place], [place]. This information is not intended to replace advice given to you by your health care provider. Make sure you discuss any questions you have with your health care provider.

## 2014-06-04 IMAGING — US US OB TRANSVAGINAL
1 series · 13 of 28 positions shown · non-contrast
Comparison: none

[Series 1: us ob transvaginal · 41 acquisitions, 13 frames shown]
[im 2/41]
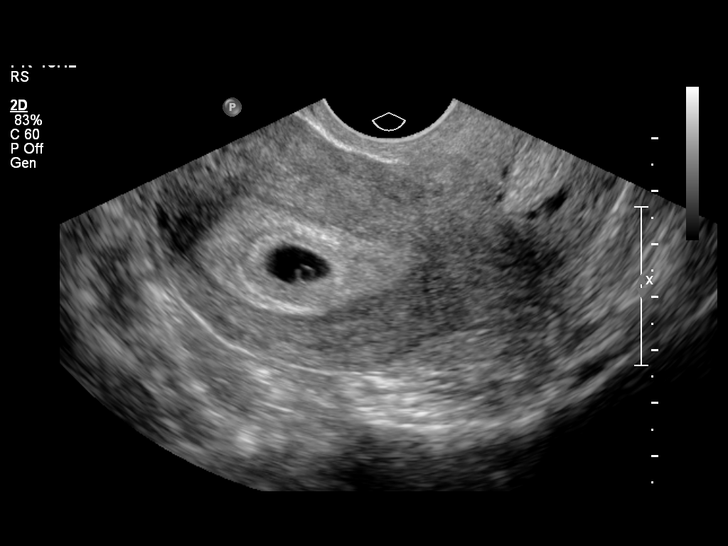
[im 5/41]
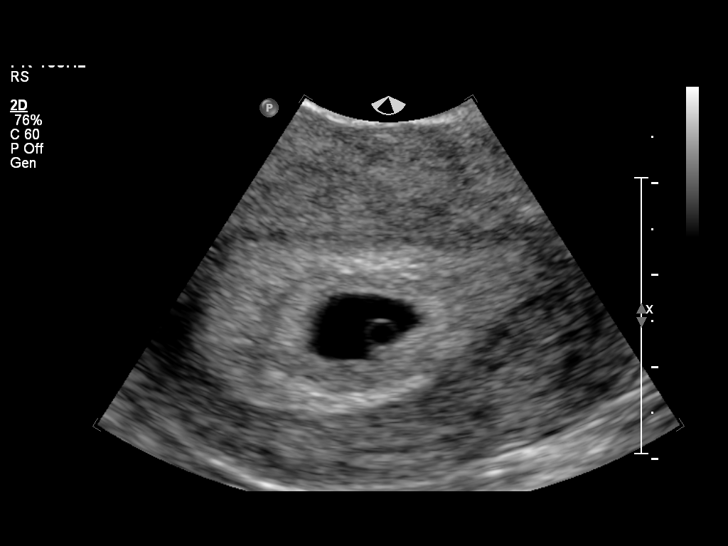
[im 8/41]
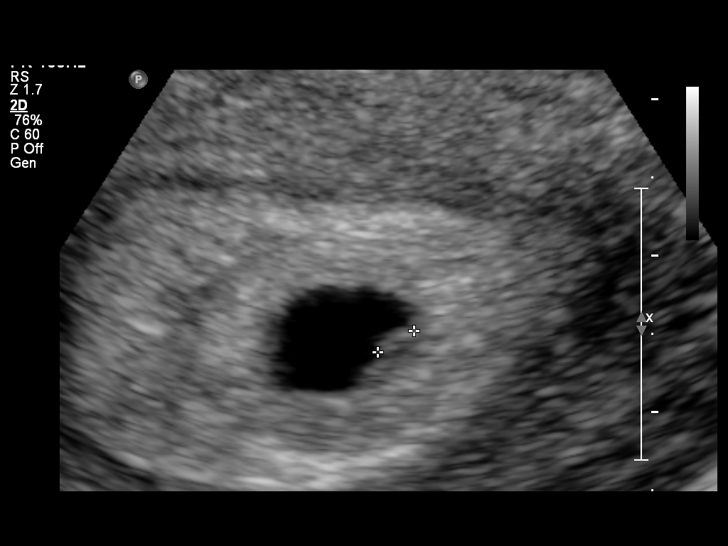
[im 11/41]
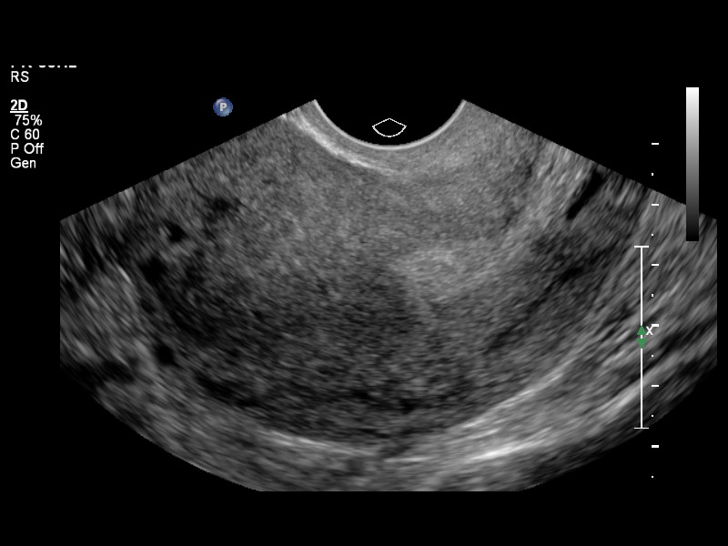
[im 14/41]
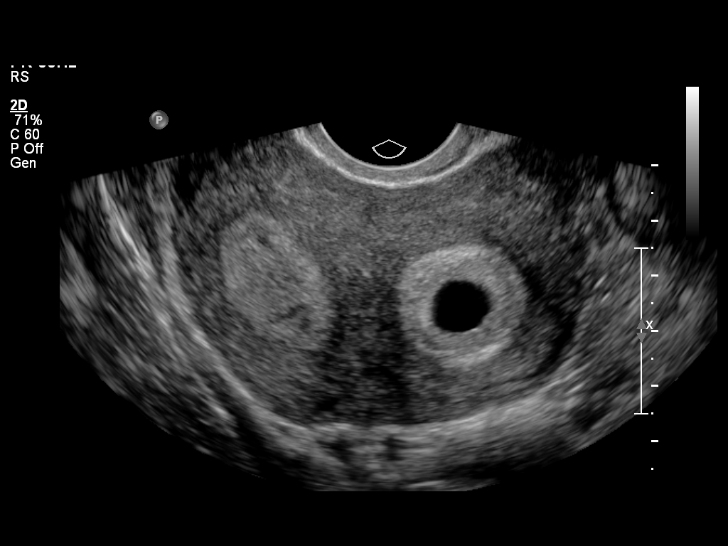
[im 17/41]
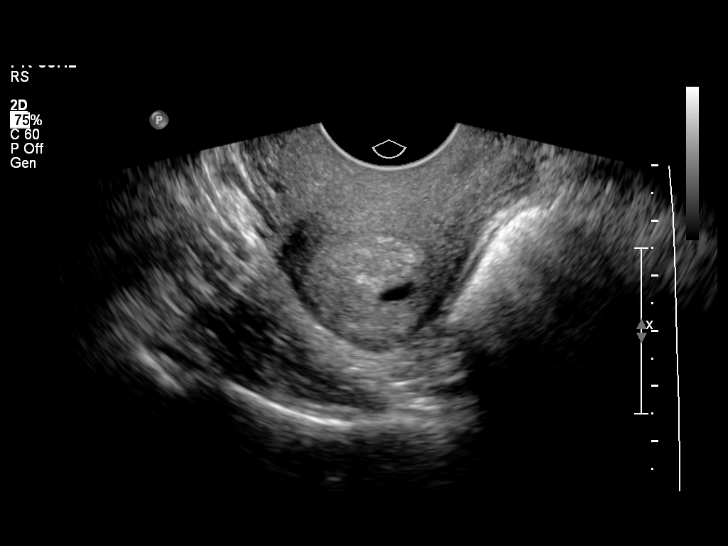
[im 21/41]
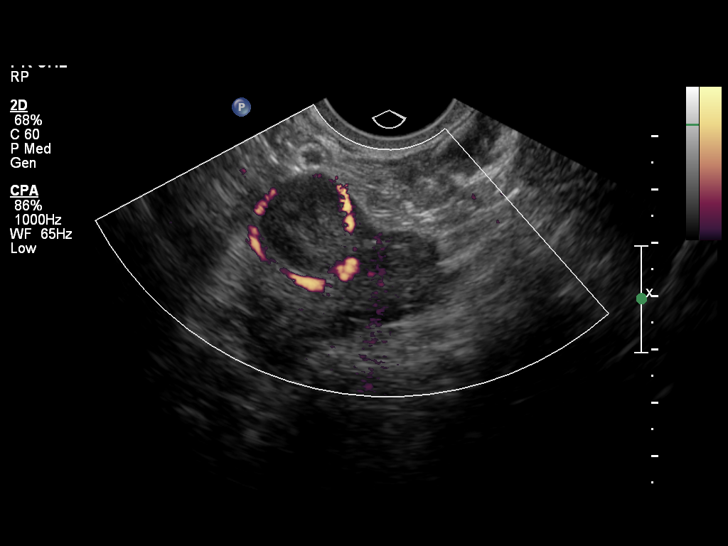
[im 24/41]
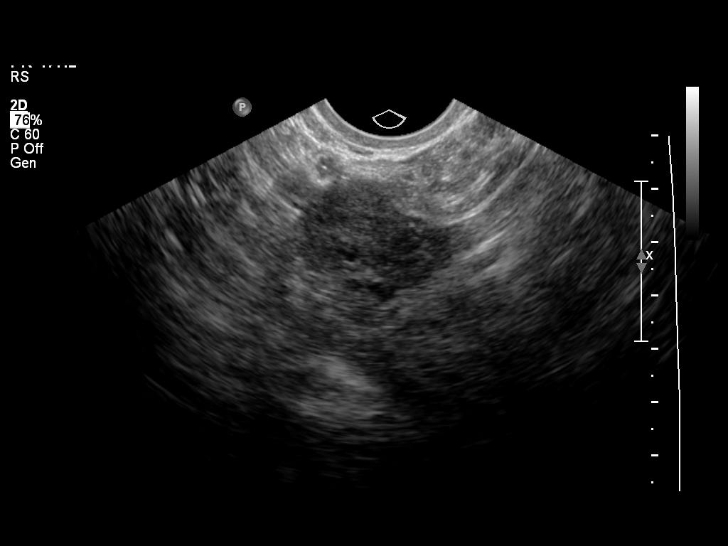
[im 27/41]
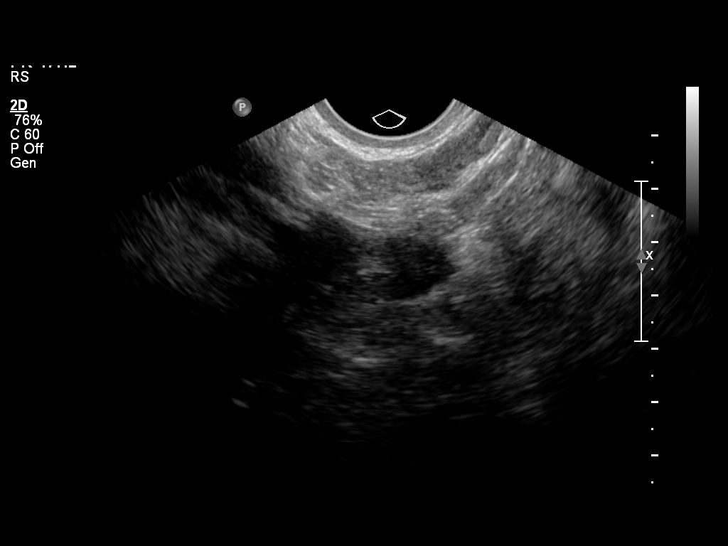
[im 30/41]
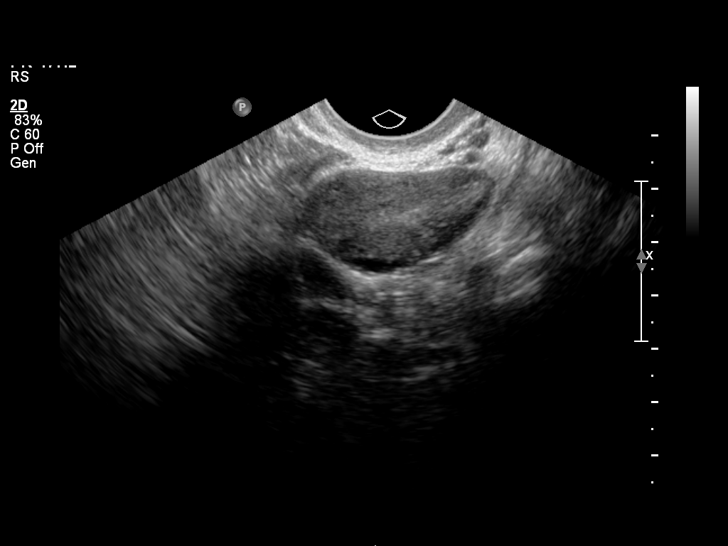
[im 33/41]
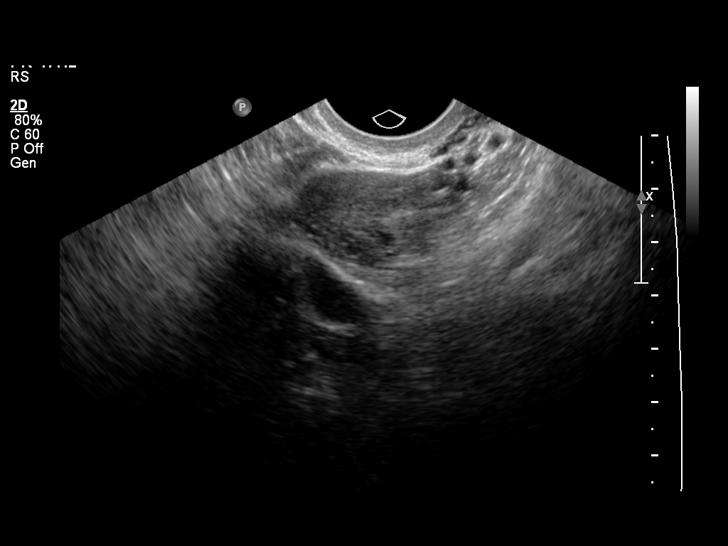
[im 36/41]
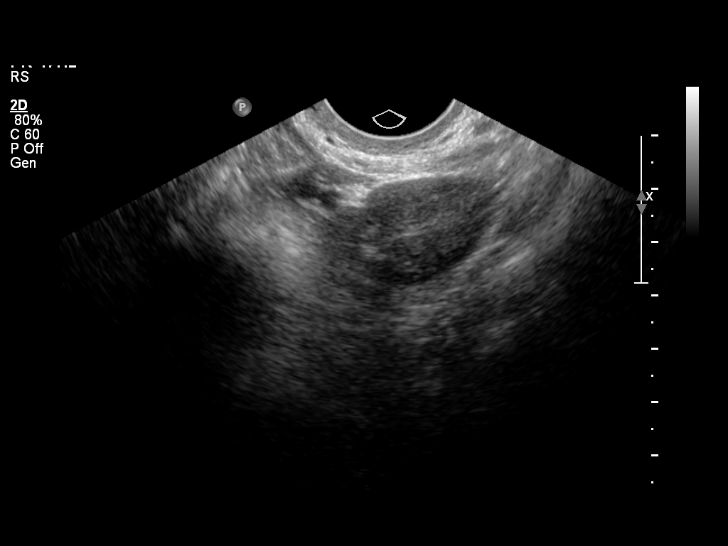
[im 39/41]
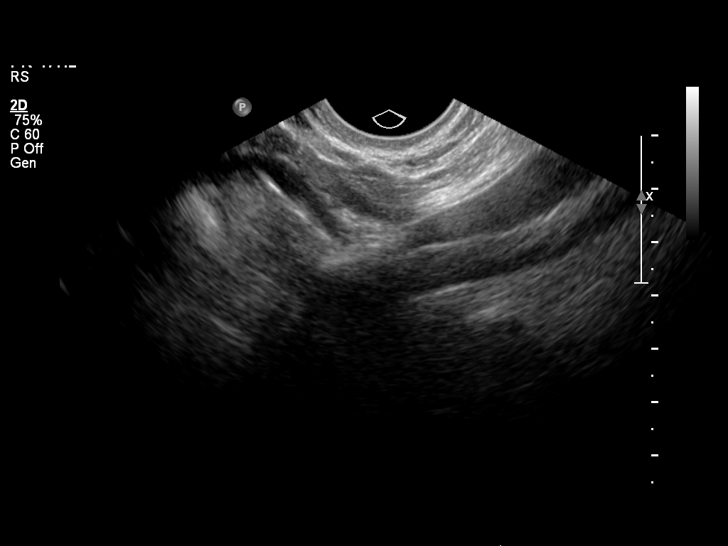

[13 of 28 positions shown; findings below may reference images not displayed]

OBSTETRICS REPORT
                      (Signed Final 05/06/2012 [DATE])

 Order#:         [id number]
Procedures

 US OB TRANSVAGINAL                                    76817.0
Indications

 Vaginal bleeding, unknown etiology
 Pregnancy with inconclusive fetal viability
 Uterine malformation (Bicornuate Uterus)
 pregnancy in Left horn
 Poor obstetric history: Previous midtrimester loss
 @ 21 weeks
 Previous cervical surgery (cerclage last pregnancy)
 Previous spontaneous abortion
 HCV infection
 History of Depression/Anxiety
 Asthma
 Hypertension - Chronic/Pre-existing (Currently
 treated with Labetalol)
Fetal Evaluation

 Preg. Location:    Intrauterine
 Gest. Sac:         Intrauterine  LEFT uterine
                    horn
 Yolk Sac:          Visualized
 Fetal Pole:        Visualized
 Fetal Heart Rate:  97                          bpm
 Cardiac Activity:  Observed
Biometry

 CRL:      2.6  mm    G. Age:   5w 6d                  EDD:   12/31/12
Gestational Age

 LMP:           6w 3d        Date:   03/22/12                 EDD:   12/27/12
 Best:          6w 3d     Det. By:   LMP  (03/22/12)          EDD:   12/27/12
Cervix Uterus Adnexa

 Cervix:       Closed.
 Uterus:       Bicornuate.
 Cul De Sac:   No free fluid seen.
 Left Ovary:   Within normal limits.
 Right Ovary:  Small corpus luteum noted.
 Adnexa:     No abnormality visualized.
Impression

 Single living IUP in the left uterine horn (patient with
 bicornuate uterus). EGA by crown-rump length of 5w 6d
 correlates with dating by LMP.
Recommendations

 Ultrasound at 18 to 19 weeks GA for fetal anatomic
 evaluation, and earler as indicated.

## 2014-06-08 NOTE — Progress Notes (Signed)
CPE scheduled 9/18 with Dr. Marin Comment.

## 2014-07-26 ENCOUNTER — Ambulatory Visit (INDEPENDENT_AMBULATORY_CARE_PROVIDER_SITE_OTHER): Payer: BC Managed Care – PPO | Admitting: Family Medicine

## 2014-07-26 VITALS — BP 120/88 | HR 80 | Temp 98.2°F | Resp 16 | Ht 62.5 in | Wt 156.6 lb

## 2014-07-26 DIAGNOSIS — R1032 Left lower quadrant pain: Secondary | ICD-10-CM

## 2014-07-26 DIAGNOSIS — R197 Diarrhea, unspecified: Secondary | ICD-10-CM

## 2014-07-26 DIAGNOSIS — R112 Nausea with vomiting, unspecified: Secondary | ICD-10-CM

## 2014-07-26 DIAGNOSIS — B3749 Other urogenital candidiasis: Secondary | ICD-10-CM

## 2014-07-26 LAB — POCT UA - MICROSCOPIC ONLY
CASTS, UR, LPF, POC: NEGATIVE
Crystals, Ur, HPF, POC: NEGATIVE
Mucus, UA: POSITIVE
RBC, URINE, MICROSCOPIC: NEGATIVE

## 2014-07-26 LAB — POCT URINALYSIS DIPSTICK
Bilirubin, UA: NEGATIVE
Blood, UA: NEGATIVE
GLUCOSE UA: NEGATIVE
KETONES UA: NEGATIVE
Leukocytes, UA: NEGATIVE
Nitrite, UA: NEGATIVE
PROTEIN UA: 30
SPEC GRAV UA: 1.025
UROBILINOGEN UA: 0.2
pH, UA: 5.5

## 2014-07-26 LAB — POCT CBC
Granulocyte percent: 72.4 %G (ref 37–80)
HCT, POC: 44.6 % (ref 37.7–47.9)
Hemoglobin: 15.3 g/dL (ref 12.2–16.2)
Lymph, poc: 2.3 (ref 0.6–3.4)
MCH, POC: 29.6 pg (ref 27–31.2)
MCHC: 34.3 g/dL (ref 31.8–35.4)
MCV: 86.3 fL (ref 80–97)
MID (CBC): 0.3 (ref 0–0.9)
MPV: 7.8 fL (ref 0–99.8)
POC GRANULOCYTE: 6.7 (ref 2–6.9)
POC LYMPH %: 24.8 % (ref 10–50)
POC MID %: 2.8 % (ref 0–12)
Platelet Count, POC: 237 10*3/uL (ref 142–424)
RBC: 5.17 M/uL (ref 4.04–5.48)
RDW, POC: 12.1 %
WBC: 9.2 10*3/uL (ref 4.6–10.2)

## 2014-07-26 MED ORDER — FLUCONAZOLE 150 MG PO TABS: 150.0000 mg | ORAL_TABLET | Freq: Once | ORAL | Status: DC

## 2014-07-26 MED ORDER — DICYCLOMINE HCL 10 MG PO CAPS: 10.0000 mg | ORAL_CAPSULE | Freq: Three times a day (TID) | ORAL | Status: DC

## 2014-07-26 NOTE — Patient Instructions (Addendum)
Eat bland food when you feel like eating something. The brat diet is good. Bananas, rice, apple sauce and toast. If bananas bother you melons are good.  Take Diflucan one dose. If any symptoms recur take a second dose.  Take dicyclomine one before meals and at bedtime as needed for cramping and diarrhea and nausea

## 2014-07-26 NOTE — Progress Notes (Signed)
Subjective: 38 year old lady who's been sick since Monday. She had some fever, diarrhea and vomiting on Monday. She missed work the last couple of days. She has had intermittent diarrhea and vomiting. She hurts generalized her abdomen but more across the lower abdomen. She gets some pretty intense cramping, likely tightness becomes with childbirth. She has not eaten much. taking in enough liquids that she is still urinating okay. She thought she was doing better yesterday and that she might be able to go to work today, vomited last night. Again this morning she is been having the cramping and diarrhea. Guaiac continued on so long.  She has a history of being a recovering drug user, off for 2 years. She does have hepatitis C. She has a history of hypertension for which her blood pressure medicine is her only main regular meds except for taking some Tylenol.  Objective: Pleasant alert lady who looks like she doesn't feel real well. Throat clear. TMs normal. Neck supple without nodes. Chest clear to auscultation. Heart regular without murmurs. Abdomen soft without masses. Active bowel sounds. Very tender across the low abdomen more in the sigmoid distribution. Skin warm and dry.  Assessment: Abdominal pain Diarrhea Vomiting  Plan Cbc, u/a Results for orders placed in visit on 07/26/14  POCT CBC      Result Value Ref Range   WBC 9.2  4.6 - 10.2 K/uL   Lymph, poc 2.3  0.6 - 3.4   POC LYMPH PERCENT 24.8  10 - 50 %L   MID (cbc) 0.3  0 - 0.9   POC MID % 2.8  0 - 12 %M   POC Granulocyte 6.7  2 - 6.9   Granulocyte percent 72.4  37 - 80 %G   RBC 5.17  4.04 - 5.48 M/uL   Hemoglobin 15.3  12.2 - 16.2 g/dL   HCT, POC 44.6  37.7 - 47.9 %   MCV 86.3  80 - 97 fL   MCH, POC 29.6  27 - 31.2 pg   MCHC 34.3  31.8 - 35.4 g/dL   RDW, POC 12.1     Platelet Count, POC 237  142 - 424 K/uL   MPV 7.8  0 - 99.8 fL  POCT UA - MICROSCOPIC ONLY      Result Value Ref Range   WBC, Ur, HPF, POC 1-4     RBC, urine,  microscopic neg     Bacteria, U Microscopic 1+     Mucus, UA positive     Epithelial cells, urine per micros 0-6     Crystals, Ur, HPF, POC neg     Casts, Ur, LPF, POC neg     Yeast, UA budding yeast    POCT URINALYSIS DIPSTICK      Result Value Ref Range   Color, UA drak yellow     Clarity, UA clear     Glucose, UA neg     Bilirubin, UA neg     Ketones, UA neg     Spec Grav, UA 1.025     Blood, UA neg     pH, UA 5.5     Protein, UA 30     Urobilinogen, UA 0.2     Nitrite, UA neg     Leukocytes, UA Negative

## 2014-08-10 ENCOUNTER — Encounter: Payer: BC Managed Care – PPO | Admitting: Family Medicine

## 2014-08-22 ENCOUNTER — Encounter: Payer: BC Managed Care – PPO | Admitting: Family Medicine

## 2014-09-03 ENCOUNTER — Telehealth: Payer: Self-pay

## 2014-09-03 DIAGNOSIS — Z8619 Personal history of other infectious and parasitic diseases: Secondary | ICD-10-CM

## 2014-09-03 DIAGNOSIS — I10 Essential (primary) hypertension: Secondary | ICD-10-CM

## 2014-09-03 MED ORDER — LISINOPRIL-HYDROCHLOROTHIAZIDE 20-12.5 MG PO TABS: 1.0000 | ORAL_TABLET | Freq: Every day | ORAL | Status: DC

## 2014-09-03 NOTE — Telephone Encounter (Signed)
Pt called in need of a refill of lisinopril until she comes in for her appt. On 10/28. She uses the [facility] on [address]. Pt's number [phone]

## 2014-09-03 NOTE — Telephone Encounter (Signed)
Sent in #30 refill. Pt notified.

## 2014-09-03 NOTE — Addendum Note (Signed)
Addended by: Jethro Bolus A on: 09/03/2014 03:45 PM   Modules accepted: Orders

## 2014-09-19 ENCOUNTER — Ambulatory Visit (INDEPENDENT_AMBULATORY_CARE_PROVIDER_SITE_OTHER): Payer: BC Managed Care – PPO | Admitting: Family Medicine

## 2014-09-19 ENCOUNTER — Encounter: Payer: Self-pay | Admitting: Family Medicine

## 2014-09-19 VITALS — BP 116/80 | HR 74 | Temp 98.7°F | Resp 16 | Ht 62.0 in | Wt 160.0 lb

## 2014-09-19 DIAGNOSIS — F411 Generalized anxiety disorder: Secondary | ICD-10-CM

## 2014-09-19 DIAGNOSIS — B1920 Unspecified viral hepatitis C without hepatic coma: Secondary | ICD-10-CM

## 2014-09-19 DIAGNOSIS — K219 Gastro-esophageal reflux disease without esophagitis: Secondary | ICD-10-CM

## 2014-09-19 DIAGNOSIS — Z13220 Encounter for screening for lipoid disorders: Secondary | ICD-10-CM

## 2014-09-19 DIAGNOSIS — Z7251 High risk heterosexual behavior: Secondary | ICD-10-CM

## 2014-09-19 DIAGNOSIS — Z131 Encounter for screening for diabetes mellitus: Secondary | ICD-10-CM

## 2014-09-19 DIAGNOSIS — Z0000 Encounter for general adult medical examination without abnormal findings: Secondary | ICD-10-CM

## 2014-09-19 DIAGNOSIS — R0789 Other chest pain: Secondary | ICD-10-CM

## 2014-09-19 DIAGNOSIS — Z01419 Encounter for gynecological examination (general) (routine) without abnormal findings: Secondary | ICD-10-CM

## 2014-09-19 DIAGNOSIS — I10 Essential (primary) hypertension: Secondary | ICD-10-CM

## 2014-09-19 LAB — CBC WITH DIFFERENTIAL/PLATELET
BASOS ABS: 0 10*3/uL (ref 0.0–0.1)
Basophils Relative: 0 % (ref 0–1)
Eosinophils Absolute: 0.1 10*3/uL (ref 0.0–0.7)
Eosinophils Relative: 1 % (ref 0–5)
HCT: 42.9 % (ref 36.0–46.0)
Hemoglobin: 14.9 g/dL (ref 12.0–15.0)
LYMPHS ABS: 3.3 10*3/uL (ref 0.7–4.0)
Lymphocytes Relative: 35 % (ref 12–46)
MCH: 28.8 pg (ref 26.0–34.0)
MCHC: 34.7 g/dL (ref 30.0–36.0)
MCV: 83 fL (ref 78.0–100.0)
Monocytes Absolute: 0.9 10*3/uL (ref 0.1–1.0)
Monocytes Relative: 9 % (ref 3–12)
NEUTROS ABS: 5.2 10*3/uL (ref 1.7–7.7)
Neutrophils Relative %: 55 % (ref 43–77)
PLATELETS: 255 10*3/uL (ref 150–400)
RBC: 5.17 MIL/uL — AB (ref 3.87–5.11)
RDW: 13.5 % (ref 11.5–15.5)
WBC: 9.5 10*3/uL (ref 4.0–10.5)

## 2014-09-19 LAB — LIPID PANEL
Cholesterol: 216 mg/dL — ABNORMAL HIGH (ref 0–200)
HDL: 51 mg/dL (ref >39)
LDL CALC: 101 mg/dL — AB (ref 0–99)
Total CHOL/HDL Ratio: 4.2 Ratio
Triglycerides: 318 mg/dL — ABNORMAL HIGH (ref <150)
VLDL: 64 mg/dL — AB (ref 0–40)

## 2014-09-19 LAB — POCT URINALYSIS DIPSTICK
Bilirubin, UA: NEGATIVE
Glucose, UA: NEGATIVE
Ketones, UA: NEGATIVE
Leukocytes, UA: NEGATIVE
NITRITE UA: NEGATIVE
PH UA: 5.5
PROTEIN UA: NEGATIVE
Spec Grav, UA: 1.015
Urobilinogen, UA: 0.2

## 2014-09-19 LAB — COMPREHENSIVE METABOLIC PANEL
ALBUMIN: 5.1 g/dL (ref 3.5–5.2)
ALT: 24 U/L (ref 0–35)
AST: 23 U/L (ref 0–37)
Alkaline Phosphatase: 52 U/L (ref 39–117)
BUN: 21 mg/dL (ref 6–23)
CALCIUM: 10.5 mg/dL (ref 8.4–10.5)
CHLORIDE: 102 meq/L (ref 96–112)
CO2: 20 meq/L (ref 19–32)
Creat: 0.74 mg/dL (ref 0.50–1.10)
Glucose, Bld: 83 mg/dL (ref 70–99)
POTASSIUM: 4.2 meq/L (ref 3.5–5.3)
SODIUM: 135 meq/L (ref 135–145)
TOTAL PROTEIN: 7.6 g/dL (ref 6.0–8.3)
Total Bilirubin: 0.4 mg/dL (ref 0.2–1.2)

## 2014-09-19 LAB — TSH: TSH: 1.304 u[IU]/mL (ref 0.350–4.500)

## 2014-09-19 MED ORDER — VENLAFAXINE HCL ER 75 MG PO CP24: 75.0000 mg | ORAL_CAPSULE | Freq: Every day | ORAL | Status: DC

## 2014-09-19 MED ORDER — OMEPRAZOLE 20 MG PO CPDR: 20.0000 mg | DELAYED_RELEASE_CAPSULE | Freq: Every day | ORAL | Status: DC

## 2014-09-19 NOTE — Patient Instructions (Signed)

## 2014-09-19 NOTE — Progress Notes (Signed)
Subjective:    Patient ID: [name], female    DOB: [birth year], 38 y.o.   MRN: [id number]  09/19/2014  Annual Exam   HPI This 38 y.o. female presents for Complete Physical Examination.   Last physical:  A long time ago. Pap smear:  2012  Irregular menses; will skip menses.  Abnormal pap smear during pregnancy in 1998. Mammogram:  Never.  Colonoscopy:  Never; father with colon cancer at age 88s? TDAP:  Maybe? Influenza: never Eye exam:  +glasses; 2014. Dental exam:  2 months ago.    STD screening: desiring HIV testing.    History of chlamydia during pregnancy in 2000.  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  Last STD screening a while ago.    HTN: taking Lisinopril HCTZ daily; started with chest pain with rdiation into arms.  Added ASA in past few months.  Chest tightness.  S/p EKG in past.  Diagnosed with pregnancy in 2012.   S/p Holter monitor during pregnancy for one month; only found fluctuating in heart rate.    Chest pain can occur sporadically; scares pt; B chest and radiates medially and radiates into B arms.  Duration of each episode few seconds.  Can occur with exertion or at rest.  +SOB; no diaphoresis.  No nausea.    Depression and anxiety: chronic; years; diagnosed with bipolar in past; ups and downs.  Anxiety is severe.   Review of Systems  Constitutional: Positive for fatigue. Negative for fever, chills, diaphoresis, activity change, appetite change and unexpected weight change.  HENT: Positive for hearing loss. Negative for congestion, dental problem, drooling, ear discharge, ear pain, facial swelling, mouth sores, nosebleeds, postnasal drip, rhinorrhea, sinus pressure, sneezing, sore throat, tinnitus, trouble swallowing and voice change.   Eyes: Negative for photophobia, pain, discharge, redness, itching and visual disturbance.  Respiratory: Negative for apnea, cough, choking, chest tightness, shortness of breath, wheezing and stridor.   Cardiovascular: Positive for  chest pain. Negative for palpitations and leg swelling.  Gastrointestinal: Positive for diarrhea. Negative for nausea, vomiting, abdominal pain, constipation, blood in stool, abdominal distention, anal bleeding and rectal pain.  Endocrine: Negative for cold intolerance, heat intolerance, polydipsia, polyphagia and polyuria.  Genitourinary: Negative for dysuria, urgency, frequency, hematuria, flank pain, decreased urine volume, vaginal bleeding, vaginal discharge, enuresis, difficulty urinating, genital sores, vaginal pain, menstrual problem, pelvic pain and dyspareunia.       Urinary leakage mild.  Musculoskeletal: Negative for arthralgias, back pain, gait problem, joint swelling, myalgias, neck pain and neck stiffness.  Skin: Negative for color change, pallor, rash and wound.  Allergic/Immunologic: Negative for environmental allergies, food allergies and immunocompromised state.  Neurological: Positive for headaches. Negative for dizziness, tremors, seizures, syncope, facial asymmetry, speech difficulty, weakness, light-headedness and numbness.  Hematological: Negative for adenopathy. Does not bruise/bleed easily.  Psychiatric/Behavioral: Positive for dysphoric mood. Negative for suicidal ideas, hallucinations, behavioral problems, confusion, sleep disturbance, self-injury, decreased concentration and agitation. The patient is nervous/anxious. The patient is not hyperactive.     Past Medical History  Diagnosis Date  . Anxiety   . GERD (gastroesophageal reflux disease)   . STD (female)     HX of STD's  . Abnormal Pap smear 1998    had colpo  . 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 Bicornuate uterus   . Hypertension 2010  . Bicornate uterus   . Headache(784.0)   . Chlamydia 2000  . IUP (intrauterine pregnancy), incidental 13 weeks  . PONV (postoperative nausea  and vomiting)   . HCV infection     HX of Hep C  . Asthma     ,as child only   no inhaler   Past Surgical  History  Procedure Laterality Date  . Therapeutic abortion    . Therapeutic abortion    . Tonsillectomy    . Dilation and curettage of uterus      SAB  . Finger surgery  2003    reattachment of right forefinger  . Cervical cerclage  09/01/2011    Procedure: CERCLAGE CERVICAL;  Surgeon: Donnamae Jude, MD;  Location: [facility];  Service: Gynecology;  Laterality: N/A;  . Cervical cerclage  06/28/2012    Procedure: CERCLAGE CERVICAL;  Surgeon: Donnamae Jude, MD;  Location: [facility];  Service: Gynecology;  Laterality: N/A;  . Bilateral salpingectomy  12/06/2012    Procedure: BILATERAL SALPINGECTOMY;  Surgeon: Mora Bellman, MD;  Location: [facility];  Service: Gynecology;  Laterality: Bilateral;  laparoscopic   Allergies  Allergen Reactions  . Vicodin [Hydrocodone-Acetaminophen] Nausea And Vomiting   Current Outpatient Prescriptions  Medication Sig Dispense Refill  . acetaminophen (TYLENOL) 500 MG tablet Take 500 mg by mouth every 6 (six) hours as needed for moderate pain.      Marland Kitchen aspirin 81 MG tablet Take 81 mg by mouth daily.      Marland Kitchen lisinopril-hydrochlorothiazide (PRINZIDE,ZESTORETIC) 20-12.5 MG per tablet Take 1 tablet by mouth daily.  30 tablet  0  . omeprazole (PRILOSEC) 20 MG capsule Take 1 capsule (20 mg total) by mouth daily.  30 capsule  5  . venlafaxine XR (EFFEXOR-XR) 75 MG 24 hr capsule Take 1 capsule (75 mg total) by mouth daily with breakfast.  30 capsule  5   No current facility-administered medications for this visit.       Objective:    BP 116/80  Pulse 74  Temp(Src) 98.7 F (37.1 C) (Oral)  Resp 16  Ht 5\' 2"  (1.575 m)  Wt 160 lb (72.576 kg)  BMI 29.26 kg/m2  SpO2 98%  LMP 08/29/2014 Physical Exam  Nursing note and vitals reviewed. Constitutional: She is oriented to person, place, and time. She appears well-developed and well-nourished. No distress.  HENT:  Head: Normocephalic and atraumatic.  Right Ear: External ear normal.  Left Ear: External ear normal.  Nose:  Nose normal.  Mouth/Throat: Oropharynx is clear and moist.  Eyes: Conjunctivae and EOM are normal. Pupils are equal, round, and reactive to light.  Neck: Normal range of motion and full passive range of motion without pain. Neck supple. No JVD present. Carotid bruit is not present. No thyromegaly present.  Cardiovascular: Normal rate, regular rhythm and normal heart sounds.  Exam reveals no gallop and no friction rub.   No murmur heard. Pulmonary/Chest: Effort normal and breath sounds normal. She has no wheezes. She has no rales. Right breast exhibits no inverted nipple, no mass, no nipple discharge, no skin change and no tenderness. Left breast exhibits no inverted nipple, no mass, no nipple discharge, no skin change and no tenderness. Breasts are symmetrical.  Abdominal: Soft. Bowel sounds are normal. She exhibits no distension and no mass. There is no tenderness. There is no rebound and no guarding.  Genitourinary: Vagina normal and uterus normal. There is no rash, tenderness, lesion or injury on the right labia. There is no rash, tenderness, lesion or injury on the left labia. Cervix exhibits no motion tenderness, no discharge and no friability. Right adnexum displays no mass, no tenderness and  no fullness. Left adnexum displays no mass, no tenderness and no fullness.  Musculoskeletal:       Right shoulder: Normal.       Left shoulder: Normal.       Cervical back: Normal.  Lymphadenopathy:    She has no cervical adenopathy.  Neurological: She is alert and oriented to person, place, and time. She has normal reflexes. No cranial nerve deficit. She exhibits normal muscle tone. Coordination normal.  Skin: Skin is warm and dry. No rash noted. She is not diaphoretic. No erythema. No pallor.  Psychiatric: She has a normal mood and affect. Her behavior is normal. Judgment and thought content normal.   Results for orders placed in visit on 07/26/14  POCT CBC      Result Value Ref Range   WBC 9.2   4.6 - 10.2 K/uL   Lymph, poc 2.3  0.6 - 3.4   POC LYMPH PERCENT 24.8  10 - 50 %L   MID (cbc) 0.3  0 - 0.9   POC MID % 2.8  0 - 12 %M   POC Granulocyte 6.7  2 - 6.9   Granulocyte percent 72.4  37 - 80 %G   RBC 5.17  4.04 - 5.48 M/uL   Hemoglobin 15.3  12.2 - 16.2 g/dL   HCT, POC 44.6  37.7 - 47.9 %   MCV 86.3  80 - 97 fL   MCH, POC 29.6  27 - 31.2 pg   MCHC 34.3  31.8 - 35.4 g/dL   RDW, POC 12.1     Platelet Count, POC 237  142 - 424 K/uL   MPV 7.8  0 - 99.8 fL  POCT UA - MICROSCOPIC ONLY      Result Value Ref Range   WBC, Ur, HPF, POC 1-4     RBC, urine, microscopic neg     Bacteria, U Microscopic 1+     Mucus, UA positive     Epithelial cells, urine per micros 0-6     Crystals, Ur, HPF, POC neg     Casts, Ur, LPF, POC neg     Yeast, UA budding yeast    POCT URINALYSIS DIPSTICK      Result Value Ref Range   Color, UA drak yellow     Clarity, UA clear     Glucose, UA neg     Bilirubin, UA neg     Ketones, UA neg     Spec Grav, UA 1.025     Blood, UA neg     pH, UA 5.5     Protein, UA 30     Urobilinogen, UA 0.2     Nitrite, UA neg     Leukocytes, UA Negative     EKG: NSR; poor R wave progression.    Assessment & Plan:   1. Encounter for routine gynecological examination   2. Routine general medical examination at a health care facility   3. Generalized anxiety disorder   4. Essential hypertension   5. Gastroesophageal reflux disease without esophagitis   6. Screening, lipid   7. Screening for diabetes mellitus (DM)   8. High risk sexual behavior   9. Hepatitis C virus infection without hepatic coma, unspecified chronicity   10. Other chest pain    1. Complete Physical Examination: anticipatory guidance --- exercise, weight loss, smoking cessation. Pap smear obtained.  Immunizations reviewed; declined influenza vaccine today.   2. Gynecological exam: pap smear obtained; s/p tubal ligation/resection.  Irregular menses.  3.  Generalized anxiety disorder:  uncontrolled; rx for Effexor XR 75mg  daily. Decreased libido with SSRIs in the past; questionable diagnosis of bipolar disorder in the past.  Follow-up in two months. 4.  HTN: controlled; obtain labs, u/a, EKG.  Continue current medications. 5.  GERD: uncontrolled; rx for Omeprazole 20mg  daily.  Dietary modification recommended. 6.  Screening lipid: obtain FLP. 7.  Screening DMII: obtain glucose, HgbA1c. 8.  High risk sexual behavior:  Obtain GC/Chlam/RPR/HIV.   9.  Hepatitis C: no previous treatment; obtain viral load and refer to hepatitis clinic if appropriate; history of iv drug use. 10. Chest pain: New.  Atypical; B pain with radiation into B arms.  Pt declined referral to cardiology; would like to treat anxiety and reevaluate chest pain.     Meds ordered this encounter  Medications  . aspirin 81 MG tablet    Sig: Take 81 mg by mouth daily.  Marland Kitchen venlafaxine XR (EFFEXOR-XR) 75 MG 24 hr capsule    Sig: Take 1 capsule (75 mg total) by mouth daily with breakfast.    Dispense:  30 capsule    Refill:  5  . omeprazole (PRILOSEC) 20 MG capsule    Sig: Take 1 capsule (20 mg total) by mouth daily.    Dispense:  30 capsule    Refill:  5    Return in about 2 months (around 11/19/2014) for recheck anxiety, chest pain.    Reginia Forts, M.D.  [facility] [address] [place], [place]  [address] [phone] phone [phone] fax

## 2014-09-20 ENCOUNTER — Telehealth: Payer: Self-pay | Admitting: *Deleted

## 2014-09-20 LAB — HEMOGLOBIN A1C
HEMOGLOBIN A1C: 5.7 % — AB (ref <5.7)
Mean Plasma Glucose: 117 mg/dL — ABNORMAL HIGH (ref <117)

## 2014-09-20 LAB — HIV ANTIBODY (ROUTINE TESTING W REFLEX): HIV: NONREACTIVE

## 2014-09-20 LAB — RPR

## 2014-09-20 LAB — HEPATITIS C ANTIBODY: HCV AB: REACTIVE — AB

## 2014-09-20 NOTE — Telephone Encounter (Signed)
Please clarify --- what test should have been ordered?  Pt is returning to office in two months; we can obtain correct test at that time.

## 2014-09-20 NOTE — Telephone Encounter (Signed)
I apologize Dr. Tamala Julian- the test that was ordered was sent with the incorrect specimen. If you would like viral load the correct test was ordered.

## 2014-09-20 NOTE — Telephone Encounter (Signed)
The wrong Hep C test was ordered/wrong specimen was sent.  I have given verbal to [facility] to do Hep C antibody test instead of Hep C RNA quant. Per ov this pt has a history of Hep C.  Do you want pt to rtn to clinic for labs only to check viral load?

## 2014-09-21 LAB — PAP IG, CT-NG NAA, HPV HIGH-RISK
Chlamydia Probe Amp: NEGATIVE
GC PROBE AMP: NEGATIVE
HPV DNA High Risk: NOT DETECTED

## 2014-09-24 ENCOUNTER — Encounter: Payer: Self-pay | Admitting: Family Medicine

## 2014-09-24 MED ORDER — VENLAFAXINE HCL ER 37.5 MG PO CP24: 37.5000 mg | ORAL_CAPSULE | Freq: Every day | ORAL | Status: DC

## 2014-09-24 NOTE — Telephone Encounter (Signed)
Call ---let's try a lower dose of medication. I have sent in 37.5mg  of medication.  Please have her stop the 75mg  of Effexor but have her hold on to them.  Most side effects greatly improve in two weeks; I think she will be able to tolerate a lower dose.

## 2014-09-24 NOTE — Telephone Encounter (Signed)
Pt feels that her personality is not even the same as she used to be.   She has only been on this medication for the past 4 days.  She has not had any motivation to do the things she normally does. She has to force herself to eat. She finds no joy or sadness.

## 2014-09-24 NOTE — Telephone Encounter (Signed)
Pt advised. She will call us back in 2 weeks with a report. Sooner if needed.

## 2014-09-24 NOTE — Telephone Encounter (Signed)
Started onvenlafaxine XR (EFFEXOR-XR) 75 MG 24 hr capsule  No appetite, no energy, nausea side effects.  Should she stop taking the medication?  Should she come back in?   [phone]

## 2014-10-07 ENCOUNTER — Other Ambulatory Visit: Payer: Self-pay | Admitting: *Deleted

## 2014-10-07 DIAGNOSIS — Z8619 Personal history of other infectious and parasitic diseases: Secondary | ICD-10-CM

## 2014-10-07 DIAGNOSIS — I10 Essential (primary) hypertension: Secondary | ICD-10-CM

## 2014-10-07 MED ORDER — LISINOPRIL-HYDROCHLOROTHIAZIDE 20-12.5 MG PO TABS: 1.0000 | ORAL_TABLET | Freq: Every day | ORAL | Status: DC

## 2014-10-22 ENCOUNTER — Ambulatory Visit (INDEPENDENT_AMBULATORY_CARE_PROVIDER_SITE_OTHER): Payer: BC Managed Care – PPO | Admitting: Physician Assistant

## 2014-10-22 VITALS — BP 138/90 | HR 86 | Temp 98.2°F | Resp 16 | Ht 63.75 in | Wt 159.6 lb

## 2014-10-22 DIAGNOSIS — G43009 Migraine without aura, not intractable, without status migrainosus: Secondary | ICD-10-CM

## 2014-10-22 MED ORDER — ONDANSETRON 4 MG PO TBDP
8.0000 mg | ORAL_TABLET | Freq: Once | ORAL | Status: AC
  Administered 2014-10-22: 8 mg via ORAL

## 2014-10-22 MED ORDER — PROMETHAZINE HCL 12.5 MG PO TABS: 12.5000 mg | ORAL_TABLET | Freq: Four times a day (QID) | ORAL | Status: DC | PRN

## 2014-10-22 MED ORDER — KETOROLAC TROMETHAMINE 60 MG/2ML IM SOLN
60.0000 mg | Freq: Once | INTRAMUSCULAR | Status: AC
  Administered 2014-10-22: 60 mg via INTRAMUSCULAR

## 2014-10-22 MED ORDER — IBUPROFEN 800 MG PO TABS: ORAL_TABLET | ORAL | Status: DC

## 2014-10-22 NOTE — Progress Notes (Signed)
IDENTIFYING INFORMATION  [name] / DOB: [birth year] / MRN: [id number]  The patient has HCV infection; Hypertension; Bipolar disorder; Bicornuate uterus; and History of intravenous drug use in remission on her problem list.  SUBJECTIVE  Chief Complaint: Headache; Photophobia; and Facial Pain   History of present illness: [name] is a well appearing 38 y.o. year old female who presents for the evaluation of headache.  She has a pertinent medical history of essential hypertension and GAD. She has had migraines in the past, with her last occuring 3 months ago.  This headache is right sided, is pounding, and is a 6-7 out 10 on pain. She has nausea (5/10), and had one episode of emesis last night.  She is sensitive to light at sound at present.  She has called out of work for today and would like a work note.  She has been eating sparingly and drinking much less 2/2 the nausea.  She has tried Tylenol PM to help with the pain and for sleep, but this has not helped greatly.    She denies a history of kidney disease.  She is taking Prilosec daily. She is not pregnant.      She  has a past medical history of Anxiety; GERD (gastroesophageal reflux disease); STD (female); Abnormal Pap smear (1998); Opiate dependence; Bicornuate uterus; Hypertension (2010); Bicornate uterus; Headache(784.0); Chlamydia (2000); IUP (intrauterine pregnancy), incidental (13 weeks); PONV (postoperative nausea and vomiting); and Asthma.    She has a current medication list which includes the following prescription(s): acetaminophen, lisinopril-hydrochlorothiazide, omeprazole, venlafaxine xr.  [name] is allergic to vicodin. She  reports that she has quit smoking. Her smoking use included Cigarettes. She has a 20 pack-year smoking history. She has never used smokeless tobacco. She reports that she does not drink alcohol or use illicit drugs. She  reports that she currently engages in sexual activity. She reports  using the following method of birth control/protection: Surgical.  The patient  has past surgical history that includes Therapeutic abortion; Therapeutic abortion; Tonsillectomy; Dilation and curettage of uterus; Finger surgery (2003); Cervical cerclage (09/01/2011); Cervical cerclage (06/28/2012); and Bilateral salpingectomy (12/06/2012).  Her family history includes Cancer (age of onset: 26) in her father; Diabetes in her paternal grandfather; Hypertension in her mother; Mental illness in her mother; Thyroid disease in her maternal grandmother. There is no history of Anesthesia problems or Other.  Review of Systems  Constitutional: Negative for fever, chills and weight loss.  HENT: Negative for nosebleeds and sore throat.   Eyes: Positive for photophobia. Negative for blurred vision and double vision.  Respiratory: Negative for cough and wheezing.   Cardiovascular: Negative for chest pain.  Gastrointestinal: Positive for nausea and vomiting. Negative for heartburn, abdominal pain, diarrhea and constipation.  Genitourinary: Negative for dysuria, urgency and frequency.  Musculoskeletal: Negative for myalgias and neck pain.  Skin: Negative for itching and rash.  Neurological: Positive for headaches. Negative for dizziness.    OBJECTIVE  Blood pressure 138/90, pulse 86, temperature 98.2 F (36.8 C), temperature source Oral, resp. rate 16, height 5' 3.75" (1.619 m), weight 159 lb 9.6 oz (72.394 kg), last menstrual period 09/25/2014, SpO2 98 %. The patient's body mass index is 27.62 kg/(m^2).  Physical Exam  Constitutional: She is oriented to person, place, and time. She appears well-developed and well-nourished.  Cardiovascular: Regular rhythm.   Respiratory: Effort normal.  Neurological: She is alert and oriented to person, place, and time. She has normal strength and normal reflexes. She displays  no atrophy and no tremor. No cranial nerve deficit. She exhibits normal muscle tone. She displays no  seizure activity. Coordination and gait normal. GCS eye subscore is 4. GCS verbal subscore is 5. GCS motor subscore is 6.  Reflex Scores:      Tricep reflexes are 2+ on the right side and 2+ on the left side.      Bicep reflexes are 2+ on the right side and 2+ on the left side.      Brachioradialis reflexes are 2+ on the right side and 2+ on the left side.      Patellar reflexes are 2+ on the right side and 2+ on the left side.      Achilles reflexes are 2+ on the right side and 2+ on the left side. Skin: Skin is warm, dry and intact. She is not diaphoretic. No pallor.     Psychiatric: She has a normal mood and affect. Her speech is normal and behavior is normal. Judgment and thought content normal. Cognition and memory are normal.    No results found for this or any previous visit (from the past 24 hour(s)).  [name] was seen today for headache, photophobia and facial pain.  Diagnoses and associated orders for this visit:  Migraine without aura and without status migrainosus, not intractable: Patient denies that phernergan will pose a challenge with regard to her history of drug abuse.  She was advised to try to abort the next HA with ibuprofen 800 mg tab for the next migraine.  Should the fail, her history of HTN makes her less than the ideal candidate for triptan medication.   - ibuprofen (ADVIL,MOTRIN) 800 MG tablet; Take 1 tab every eight hours for pain - promethazine (PHENERGAN) 12.5 MG tablet; Take 1-2 tablets (12.5-25 mg total) by mouth every 6 (six) hours as needed for nausea or vomiting. - ondansetron (ZOFRAN-ODT) disintegrating tablet 8 mg; Take 2 tablets (8 mg total) by mouth once. - ketorolac (TORADOL) injection 60 mg; Inject 2 mLs (60 mg total) into the muscle once.    The patient was instructed to to call or comeback to clinic as needed, or should symptoms warrant.  Philis Fendt, MHS, PA-C [facility] 10/22/2014 5:34 PM

## 2014-10-22 NOTE — Patient Instructions (Signed)

## 2014-10-29 NOTE — Progress Notes (Signed)
I have discussed this case with Mr. [name], [place] and agree.

## 2014-11-07 ENCOUNTER — Telehealth: Payer: Self-pay

## 2014-11-07 DIAGNOSIS — Z8619 Personal history of other infectious and parasitic diseases: Secondary | ICD-10-CM

## 2014-11-07 DIAGNOSIS — I10 Essential (primary) hypertension: Secondary | ICD-10-CM

## 2014-11-07 MED ORDER — LISINOPRIL-HYDROCHLOROTHIAZIDE 20-12.5 MG PO TABS: 1.0000 | ORAL_TABLET | Freq: Every day | ORAL | Status: DC

## 2014-11-07 NOTE — Telephone Encounter (Signed)
Pt called and asked for RFs on lisinopril HCT. Done.

## 2014-11-19 ENCOUNTER — Ambulatory Visit: Payer: BC Managed Care – PPO | Admitting: Family Medicine

## 2014-12-12 ENCOUNTER — Ambulatory Visit (INDEPENDENT_AMBULATORY_CARE_PROVIDER_SITE_OTHER): Payer: BLUE CROSS/BLUE SHIELD | Admitting: Family Medicine

## 2014-12-12 ENCOUNTER — Encounter: Payer: Self-pay | Admitting: Family Medicine

## 2014-12-12 VITALS — BP 130/100 | HR 90 | Temp 98.3°F | Resp 16 | Ht 63.25 in | Wt 159.8 lb

## 2014-12-12 DIAGNOSIS — Z8619 Personal history of other infectious and parasitic diseases: Secondary | ICD-10-CM

## 2014-12-12 DIAGNOSIS — F411 Generalized anxiety disorder: Secondary | ICD-10-CM

## 2014-12-12 DIAGNOSIS — I10 Essential (primary) hypertension: Secondary | ICD-10-CM

## 2014-12-12 DIAGNOSIS — G43009 Migraine without aura, not intractable, without status migrainosus: Secondary | ICD-10-CM

## 2014-12-12 DIAGNOSIS — Z7251 High risk heterosexual behavior: Secondary | ICD-10-CM

## 2014-12-12 LAB — POCT WET PREP WITH KOH
KOH Prep POC: NEGATIVE
RBC Wet Prep HPF POC: NEGATIVE
TRICHOMONAS UA: NEGATIVE
Yeast Wet Prep HPF POC: NEGATIVE

## 2014-12-12 LAB — COMPREHENSIVE METABOLIC PANEL
ALT: 19 U/L (ref 0–35)
AST: 20 U/L (ref 0–37)
Albumin: 4.8 g/dL (ref 3.5–5.2)
Alkaline Phosphatase: 49 U/L (ref 39–117)
BILIRUBIN TOTAL: 0.4 mg/dL (ref 0.2–1.2)
BUN: 13 mg/dL (ref 6–23)
CO2: 23 mEq/L (ref 19–32)
CREATININE: 0.64 mg/dL (ref 0.50–1.10)
Calcium: 10.2 mg/dL (ref 8.4–10.5)
Chloride: 103 mEq/L (ref 96–112)
Glucose, Bld: 80 mg/dL (ref 70–99)
Potassium: 4.1 mEq/L (ref 3.5–5.3)
Sodium: 137 mEq/L (ref 135–145)
Total Protein: 7.6 g/dL (ref 6.0–8.3)

## 2014-12-12 MED ORDER — LISINOPRIL-HYDROCHLOROTHIAZIDE 20-25 MG PO TABS: 1.0000 | ORAL_TABLET | Freq: Every day | ORAL | Status: DC

## 2014-12-12 MED ORDER — LISINOPRIL-HYDROCHLOROTHIAZIDE 20-12.5 MG PO TABS: 1.0000 | ORAL_TABLET | Freq: Every day | ORAL | Status: DC

## 2014-12-12 MED ORDER — IBUPROFEN 800 MG PO TABS: ORAL_TABLET | ORAL | Status: DC

## 2014-12-12 NOTE — Patient Instructions (Signed)

## 2014-12-12 NOTE — Progress Notes (Signed)
Subjective:    Patient ID: [name], female    DOB: 1976-04-09, 39 y.o.   MRN: [id number]  HPI  This is a 39 year old female with PMH HTN, GERD, anxiety, Hep C infection and migraine presenting for follow up.  HTN: Takes lisinpril-HCTZ. 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 She takes her BP at work and usually 120s/60-80s. Does have spikes in BP at times; usually associated with anxiety; will get a headache with BP spikes; interested in higher dose of BP medication to prevent spikes in BP.   Chest pain: 3 months ago at last visit she was having some chest pain. EKG was normal. At that time she did not want to be seen by cardiology as she felt it was due to anxiety. She has not had problems with chest pain since that time. She is now being treated with PPI and effexor, see below.  No recurrent pain.  GERD: At last visit she was prescribed prilosec 20 mg for heartburn. She states this has greatly helped her symptoms.  Denies n/v/d/c; denies melena or bloody stools.   Anxiety: At last visit she was started on effexor 75 mg. Shortly after starting she started having nausea and "felt like a zombie". She backed down to 37.5 mg and this has worked well for her. She states she has occasional anxiety but states "who doesn't have some amount of anxiety in their lives". Overall she is much better.  She is suffering with delayed orgasm with small dose of effexor; will sometimes take Effexor qod.  Feels that mood is much improved from last visit; continues to have same life stressors but is just handling them better.  Hep C: She need viral load today.  Previous IV drug use.  Hepatitis Ab for C + at last visit.  Migraine: about 1 month ago she was seen here for migraine headache. She was given phenergan and ibuprofen 800 mg. She states the ibuprofen works really well for her. She only occasionally has to take phenergan when headaches are  especially bad. Requesting refill of Ibuprofen.  Pt wants STD testing today. She has had one female sexual partner in the past 1 year. He recently told her he has another sexual partner. She is wanting full testing today. She denies vaginal discharge, itching, genital sores, urinary symptoms. She is having some lower abdominal cramping today but states she usually has abdominal cramping 2 weeks before her periods. LMP 11/24/14. She has had a tubal ligation.  Review of Systems  Constitutional: Negative for fever, chills, diaphoresis and fatigue.  Eyes: Negative for visual disturbance.  Respiratory: Negative for cough and shortness of breath.   Cardiovascular: Negative for chest pain, palpitations and leg swelling.  Gastrointestinal: Positive for abdominal pain. Negative for nausea, vomiting, diarrhea and constipation.  Endocrine: Negative for cold intolerance, heat intolerance, polydipsia, polyphagia and polyuria.  Genitourinary: Negative for dysuria, urgency, frequency, vaginal bleeding, vaginal discharge and genital sores.  Neurological: Positive for headaches. Negative for dizziness, tremors, seizures, syncope, facial asymmetry, speech difficulty, weakness, light-headedness and numbness.  Psychiatric/Behavioral: Negative for suicidal ideas, sleep disturbance, self-injury and dysphoric mood. The patient is nervous/anxious.     Patient Active Problem List   Diagnosis Date Noted  . History of intravenous drug use in remission 09/07/2011  . HCV infection   . Hypertension   . Bipolar disorder   . Bicornuate uterus    Prior to  Admission medications   Medication Sig Start Date End Date Taking? Authorizing Provider  ibuprofen (ADVIL,MOTRIN) 800 MG tablet Take 1 tab every eight hours for pain 10/22/14  Yes Kathlen Brunswick, PA-C  lisinopril-hydrochlorothiazide (PRINZIDE,ZESTORETIC) 20-12.5 MG per tablet Take 1 tablet by mouth daily. 11/07/14  Yes Wardell Honour, MD  omeprazole (PRILOSEC) 20 MG  capsule Take 1 capsule (20 mg total) by mouth daily. 09/19/14  Yes Wardell Honour, MD  promethazine (PHENERGAN) 12.5 MG tablet Take 1-2 tablets (12.5-25 mg total) by mouth every 6 (six) hours as needed for nausea or vomiting. 10/22/14  Yes Kathlen Brunswick, PA-C  venlafaxine XR (EFFEXOR-XR) 37.5 MG 24 hr capsule Take 1 capsule (37.5 mg total) by mouth daily with breakfast. 09/24/14  Yes Wardell Honour, MD                 Allergies  Allergen Reactions  . Vicodin [Hydrocodone-Acetaminophen] Nausea And Vomiting   Patient's social and family history were reviewed.     Objective:   Physical Exam  Constitutional: She is oriented to person, place, and time. She appears well-developed and well-nourished. No distress.  HENT:  Head: Normocephalic and atraumatic.  Right Ear: Hearing normal.  Left Ear: Hearing normal.  Nose: Nose normal.  Mouth/Throat: Uvula is midline, oropharynx is clear and moist and mucous membranes are normal.  Eyes: Conjunctivae, EOM and lids are normal. Pupils are equal, round, and reactive to light. Right eye exhibits no discharge. Left eye exhibits no discharge. No scleral icterus.  Neck: Normal range of motion. Neck supple. No thyromegaly present.  Cardiovascular: Normal rate, regular rhythm, normal heart sounds, intact distal pulses and normal pulses.   No murmur heard. Pulmonary/Chest: Effort normal and breath sounds normal. No respiratory distress. She has no wheezes. She has no rhonchi. She has no rales.  Abdominal: Soft. Normal appearance and bowel sounds are normal. She exhibits no distension and no mass. There is tenderness (slight, lower abdomen). There is no rebound and no guarding.  Genitourinary: Vagina normal and uterus normal. There is no lesion on the right labia. There is no lesion on the left labia. Cervix exhibits no motion tenderness. Right adnexum displays no tenderness and no fullness. Left adnexum displays no tenderness and no fullness.  Cervix was  positioned very posteriorly, could not visualize os. (gyn exam performed by Bennett Scrape, PA-C)  Musculoskeletal: Normal range of motion.  Lymphadenopathy:    She has no cervical adenopathy.  Neurological: She is alert and oriented to person, place, and time.  Skin: Skin is warm, dry and intact. No lesion and no rash noted.  Psychiatric: She has a normal mood and affect. Her speech is normal and behavior is normal. Thought content normal.   BP 130/100 mmHg  Pulse 90  Temp(Src) 98.3 F (36.8 C) (Oral)  Resp 16  Ht 5' 3.25" (1.607 m)  Wt 159 lb 12.8 oz (72.485 kg)  BMI 28.07 kg/m2  SpO2 98%  LMP 11/24/2014     Assessment & Plan:  1. Essential hypertension -Moderately controlled; increase Lisinopril/HCTZ to 20/25 daily.  BP at work runs 120s/60-80s. Follow up in 3 months. - Comprehensive metabolic panel  2. History of hepatitis C - Hepatitis C RNA quantitative obtained; history of iv drug use.  3. Migraine without aura and without status migrainosus, not intractable Refilled ibuprofen, works well. - ibuprofen (ADVIL,MOTRIN) 800 MG tablet; Take 1 tab every eight hours for pain  Dispense: 30 tablet; Refill: 2  4. High risk  sexual behavior Labs below pending. - POCT Wet Prep with KOH - GC/Chlamydia Probe Amp - RPR - HIV antibody - HSV(herpes simplex vrs) 1+2 ab-IgG   5. Generalized anxiety disorder Improved with Effexor XR 37.5mg  daily; no changes to management at this time; follow-up in three months.   Kristi Elayne Guerin, M.D. [facility] [address] [place], [place]  [address] [phone] phone [phone] fax  Benjaman Pott. Drenda Freeze, MHS [facility]  12/13/2014

## 2014-12-13 LAB — RPR

## 2014-12-13 LAB — HSV(HERPES SIMPLEX VRS) I + II AB-IGG
HSV 1 GLYCOPROTEIN G AB, IGG: 4.3 IV — AB
HSV 2 Glycoprotein G Ab, IgG: <0.1 IV

## 2014-12-13 LAB — GC/CHLAMYDIA PROBE AMP
CT PROBE, AMP APTIMA: NEGATIVE
GC Probe RNA: NEGATIVE

## 2014-12-13 LAB — HIV ANTIBODY (ROUTINE TESTING W REFLEX): HIV 1&2 Ab, 4th Generation: NONREACTIVE

## 2014-12-17 LAB — HEPATITIS C RNA QUANTITATIVE: HCV Quantitative Log: <1.18 {Log} (ref <1.18)

## 2015-01-08 ENCOUNTER — Ambulatory Visit (INDEPENDENT_AMBULATORY_CARE_PROVIDER_SITE_OTHER): Payer: BLUE CROSS/BLUE SHIELD | Admitting: Physician Assistant

## 2015-01-08 VITALS — BP 160/100 | HR 82 | Temp 97.7°F | Resp 16 | Ht 63.25 in | Wt 166.0 lb

## 2015-01-08 DIAGNOSIS — R05 Cough: Secondary | ICD-10-CM

## 2015-01-08 DIAGNOSIS — B349 Viral infection, unspecified: Secondary | ICD-10-CM

## 2015-01-08 DIAGNOSIS — R059 Cough, unspecified: Secondary | ICD-10-CM

## 2015-01-08 MED ORDER — PROMETHAZINE-CODEINE 6.25-10 MG/5ML PO SYRP: 5.0000 mL | ORAL_SOLUTION | Freq: Two times a day (BID) | ORAL | Status: AC

## 2015-01-08 MED ORDER — VALACYCLOVIR HCL 1 G PO TABS: 1000.0000 mg | ORAL_TABLET | Freq: Two times a day (BID) | ORAL | Status: AC

## 2015-01-08 MED ORDER — BENZONATATE 100 MG PO CAPS: 100.0000 mg | ORAL_CAPSULE | Freq: Three times a day (TID) | ORAL | Status: DC | PRN

## 2015-01-08 NOTE — Patient Instructions (Signed)
Please return if you have fever uncontrolled, facial weakness, drooping, change in vision, hearing, or weakness in extremities.

## 2015-01-08 NOTE — Progress Notes (Signed)
[facility] [address] [place] [address] [phone]  Date:  01/08/2015   Name:  Breklyn Fabrizio   DOB:  [birth year]   MRN:  [id number]  PCP:  No PCP Per Patient    Chief Complaint: Fever; Sore Throat; Generalized Body Aches; Diarrhea; and Needs note for work   History of Present Illness:  Kashvi Carreras is a 39 y.o. very pleasant female patient who presents with the following: Patient reports symptoms starting one week ago with fever (100.2), body aches, and diarrhea.  She states that she has felt dry despite hydrating very well.  She reports her nasal mucus as yellow green.  She has a dry cough with intermittent green-yellow sputum.  She does have post-nasal drip.  She also noticed that after a few days of feeling ill, she developed blisters along her right nostril.  She was not rubbing her nose with tissue though nose was runny.  She also states that she has tingling all over at face and extremities, though not now.  She denies any vision or hearing changes.  She denies facial weakness, or any extremity weakness.  Patient states that she has not taken her medication today for her anti-hypertensives.  She denies any chest pain or sob. Patient has not relapsed and has maintained sobriety for years.  She is requesting medication for nighttime cough.    Patient Active Problem List   Diagnosis Date Noted  . History of intravenous drug use in remission 09/07/2011  . HCV infection   . Hypertension   . Bipolar disorder   . Bicornuate uterus     Past Medical History  Diagnosis Date  . Anxiety   . GERD (gastroesophageal reflux disease)   . STD (female)     HX of STD's  . Abnormal Pap smear 1998    had colpo  . 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 Bicornuate uterus   . Hypertension 2010  . Bicornate uterus   . Headache(784.0)   . Chlamydia 2000  . IUP (intrauterine pregnancy), incidental 13 weeks  . PONV (postoperative nausea and vomiting)    . HCV infection     HX of Hep C  . Asthma     ,as child only   no inhaler    Past Surgical History  Procedure Laterality Date  . Therapeutic abortion    . Therapeutic abortion    . Tonsillectomy    . Dilation and curettage of uterus      SAB  . Finger surgery  2003    reattachment of right forefinger  . Cervical cerclage  09/01/2011    Procedure: CERCLAGE CERVICAL;  Surgeon: Donnamae Jude, MD;  Location: [facility];  Service: Gynecology;  Laterality: N/A;  . Cervical cerclage  06/28/2012    Procedure: CERCLAGE CERVICAL;  Surgeon: Donnamae Jude, MD;  Location: [facility];  Service: Gynecology;  Laterality: N/A;  . Bilateral salpingectomy  12/06/2012    Procedure: BILATERAL SALPINGECTOMY;  Surgeon: Mora Bellman, MD;  Location: [facility];  Service: Gynecology;  Laterality: Bilateral;  laparoscopic    History  Substance Use Topics  . Smoking status: Former Smoker -- 1.00 packs/day for 20 years    Types: Cigarettes    Start date: 05/04/2014  . Smokeless tobacco: Never Used     Comment: using the vapor only  . Alcohol Use: No     Comment: sober since 01/22/11    Family History  Problem  Relation Age of Onset  . Hypertension Mother   . Mental illness Mother     depression; alot of mental illness.  . Thyroid disease Maternal Grandmother   . Diabetes Paternal Grandfather   . Anesthesia problems Neg Hx   . Other Neg Hx   . Cancer Father 65    Colon cancer age 17    Allergies  Allergen Reactions  . Vicodin [Hydrocodone-Acetaminophen] Nausea And Vomiting    Medication list has been reviewed and updated.  Current Outpatient Prescriptions on File Prior to Visit  Medication Sig Dispense Refill  . acetaminophen (TYLENOL) 500 MG tablet Take 500 mg by mouth every 6 (six) hours as needed for moderate pain.    Marland Kitchen ibuprofen (ADVIL,MOTRIN) 800 MG tablet Take 1 tab every eight hours for pain 30 tablet 2  . lisinopril-hydrochlorothiazide (PRINZIDE,ZESTORETIC) 20-25 MG per tablet Take 1 tablet by  mouth daily. 90 tablet 3  . omeprazole (PRILOSEC) 20 MG capsule Take 1 capsule (20 mg total) by mouth daily. 30 capsule 5  . venlafaxine XR (EFFEXOR-XR) 37.5 MG 24 hr capsule Take 1 capsule (37.5 mg total) by mouth daily with breakfast. 30 capsule 2  . promethazine (PHENERGAN) 12.5 MG tablet Take 1-2 tablets (12.5-25 mg total) by mouth every 6 (six) hours as needed for nausea or vomiting. (Patient not taking: Reported on 01/08/2015) 30 tablet 0   No current facility-administered medications on file prior to visit.    Review of Systems: ROS otherwise unlikely unless listed above.  Physical Examination: Filed Vitals:   01/08/15 1255  BP: 160/100  Pulse: 82  Temp: 97.7 F (36.5 C)  Resp: 16   Filed Vitals:   01/08/15 1255  Height: 5' 3.25" (1.607 m)  Weight: 166 lb (75.297 kg)   Body mass index is 29.16 kg/(m^2). Ideal Body Weight: Weight in (lb) to have BMI = 25: 142  Physical Exam  Constitutional: She is oriented to person, place, and time. She appears well-developed and well-nourished. No distress.  HENT:  Head: Normocephalic and atraumatic.  Right Ear: Hearing, tympanic membrane, external ear and ear canal normal.  Left Ear: Hearing, tympanic membrane, external ear and ear canal normal.  Ears:  Nose: Rhinorrhea present. No mucosal edema. Right sinus exhibits no maxillary sinus tenderness and no frontal sinus tenderness. Left sinus exhibits no maxillary sinus tenderness and no frontal sinus tenderness.  Mouth/Throat: No oropharyngeal exudate.  Eyes: Conjunctivae and EOM are normal. Pupils are equal, round, and reactive to light. Right eye exhibits no discharge. Left eye exhibits no discharge. No scleral icterus.  Neck: Normal range of motion. Neck supple. No thyromegaly present.  Cardiovascular: Normal rate, regular rhythm, normal heart sounds and intact distal pulses.  Exam reveals no gallop and no friction rub.   No murmur heard. Pulmonary/Chest: Effort normal and breath  sounds normal. No respiratory distress. She has no wheezes.  Lymphadenopathy:    She has no cervical adenopathy.  Neurological: She is alert and oriented to person, place, and time. No cranial nerve deficit. Coordination normal.  Skin: She is not diaphoretic.  Right alar rim of nostril has small erythematous lesions.  No lesions within the nostril.  3 pin point macules up the nasal bridge to the frontal sinus.    Psychiatric: She has a normal mood and affect. Her behavior is normal.     Assessment and Plan: 39 year old female is here today for chief complaint of fever, sore throat, generalized body ache, diarrhea, and to have a note  for work.  The need for a note is what prompted the appointment for employer.  This looks viral in etiology.  Due to the suspicious lesions, I am treating her with anti-viral.  Advised patient of alarming symptoms of facial weakness, hearing/vision changes, etc., though this is unlikely--that would warrant immediate return.  Viral infection - Plan: valACYclovir (VALTREX) 1000 MG tablet  Cough - Plan: benzonatate (TESSALON) 100 MG capsule, promethazine-codeine (PHENERGAN WITH CODEINE) 6.25-10 MG/5ML syrup  Ivar Drape, PA-C [facility] 2/18/20167:47 AM

## 2015-01-27 ENCOUNTER — Other Ambulatory Visit: Payer: Self-pay | Admitting: Family Medicine

## 2015-04-03 ENCOUNTER — Other Ambulatory Visit: Payer: Self-pay | Admitting: Family Medicine

## 2015-04-07 ENCOUNTER — Ambulatory Visit (INDEPENDENT_AMBULATORY_CARE_PROVIDER_SITE_OTHER): Payer: BLUE CROSS/BLUE SHIELD | Admitting: Physician Assistant

## 2015-04-07 VITALS — BP 122/100 | HR 113 | Temp 98.1°F | Resp 16 | Ht 62.5 in | Wt 162.0 lb

## 2015-04-07 DIAGNOSIS — B9689 Other specified bacterial agents as the cause of diseases classified elsewhere: Secondary | ICD-10-CM

## 2015-04-07 DIAGNOSIS — R300 Dysuria: Secondary | ICD-10-CM | POA: Diagnosis not present

## 2015-04-07 DIAGNOSIS — G43A0 Cyclical vomiting, not intractable: Secondary | ICD-10-CM | POA: Diagnosis not present

## 2015-04-07 DIAGNOSIS — A499 Bacterial infection, unspecified: Secondary | ICD-10-CM | POA: Diagnosis not present

## 2015-04-07 DIAGNOSIS — N760 Acute vaginitis: Secondary | ICD-10-CM

## 2015-04-07 DIAGNOSIS — K088 Other specified disorders of teeth and supporting structures: Secondary | ICD-10-CM

## 2015-04-07 DIAGNOSIS — R1115 Cyclical vomiting syndrome unrelated to migraine: Secondary | ICD-10-CM

## 2015-04-07 DIAGNOSIS — K0889 Other specified disorders of teeth and supporting structures: Secondary | ICD-10-CM

## 2015-04-07 LAB — POCT WET PREP WITH KOH
KOH Prep POC: NEGATIVE
TRICHOMONAS UA: NEGATIVE
Yeast Wet Prep HPF POC: NEGATIVE

## 2015-04-07 LAB — POCT UA - MICROSCOPIC ONLY
CRYSTALS, UR, HPF, POC: NEGATIVE
Casts, Ur, LPF, POC: NEGATIVE
MUCUS UA: NEGATIVE
Yeast, UA: NEGATIVE

## 2015-04-07 LAB — POCT URINALYSIS DIPSTICK
BILIRUBIN UA: NEGATIVE
Glucose, UA: NEGATIVE
Ketones, UA: NEGATIVE
Nitrite, UA: NEGATIVE
Protein, UA: NEGATIVE
Spec Grav, UA: 1.01
UROBILINOGEN UA: 0.2
pH, UA: 7

## 2015-04-07 LAB — POCT CBC
Granulocyte percent: 63.3 %G (ref 37–80)
HCT, POC: 48.6 % — AB (ref 37.7–47.9)
HEMOGLOBIN: 16 g/dL (ref 12.2–16.2)
Lymph, poc: 2.3 (ref 0.6–3.4)
MCH, POC: 27.9 pg (ref 27–31.2)
MCHC: 33 g/dL (ref 31.8–35.4)
MCV: 84.6 fL (ref 80–97)
MID (cbc): 0.5 (ref 0–0.9)
MPV: 7.7 fL (ref 0–99.8)
PLATELET COUNT, POC: 318 10*3/uL (ref 142–424)
POC Granulocyte: 4.8 (ref 2–6.9)
POC LYMPH %: 30.6 % (ref 10–50)
POC MID %: 6.1 %M (ref 0–12)
RBC: 5.74 M/uL — AB (ref 4.04–5.48)
RDW, POC: 13.5 %
WBC: 7.6 10*3/uL (ref 4.6–10.2)

## 2015-04-07 MED ORDER — NAPROXEN 500 MG PO TABS: 500.0000 mg | ORAL_TABLET | Freq: Three times a day (TID) | ORAL | Status: DC

## 2015-04-07 MED ORDER — METRONIDAZOLE 500 MG PO TABS: 500.0000 mg | ORAL_TABLET | Freq: Three times a day (TID) | ORAL | Status: DC

## 2015-04-07 MED ORDER — KETOROLAC TROMETHAMINE 60 MG/2ML IM SOLN
60.0000 mg | Freq: Once | INTRAMUSCULAR | Status: AC
Start: 2015-04-07 — End: 2015-04-07
  Administered 2015-04-07: 60 mg via INTRAMUSCULAR

## 2015-04-07 MED ORDER — FLUCONAZOLE 150 MG PO TABS: 150.0000 mg | ORAL_TABLET | Freq: Once | ORAL | Status: DC

## 2015-04-07 MED ORDER — ONDANSETRON 4 MG PO TBDP
8.0000 mg | ORAL_TABLET | Freq: Once | ORAL | Status: AC
  Administered 2015-06-21: 8 mg via ORAL

## 2015-04-07 MED ORDER — ACETAMINOPHEN 500 MG PO TABS: 1000.0000 mg | ORAL_TABLET | Freq: Three times a day (TID) | ORAL | Status: DC | PRN

## 2015-04-07 NOTE — Progress Notes (Signed)
04/08/2015 at 10:29 AM  [name] / DOB: [birth year] / MRN: [id number]  The patient has HCV infection; Hypertension; Bipolar disorder; Bicornuate uterus; and History of intravenous drug use in remission on her problem list.  SUBJECTIVE  Chief complaint: Infected tooth and Vaginitis  Patient here for a failed tooth extraction last Wednesday and was placed on penicillin after failed procedure. She has a follow up with her dentist in two weeks.  Has taken clindamycin in the past without difficulty.  Reports fever, nausea, HA, lower jaw pain, and difficulty chewing.  Denies difficulty swallowing.   Reports some vaginal itching after penicillin administration and had to stop the medication due to vaginal irritation.  She reports mild white discharge.  Vaginal irritation worse after urination.  Has tried diflucan without relief.  Denies abdominal pain.    She  has a past medical history of Anxiety; GERD (gastroesophageal reflux disease); STD (female); Abnormal Pap smear (1998); Opiate dependence; Bicornuate uterus; Hypertension (2010); Bicornate uterus; Headache(784.0); Chlamydia (2000); IUP (intrauterine pregnancy), incidental (13 weeks); PONV (postoperative nausea and vomiting); HCV infection; and Asthma.    Medications reviewed and updated by myself where necessary, and exist elsewhere in the encounter.   [name] is allergic to vicodin. She  reports that she has quit smoking. Her smoking use included Cigarettes. She started smoking about 11 months ago. She has a 20 pack-year smoking history. She has never used smokeless tobacco. She reports that she does not drink alcohol or use illicit drugs. She  reports that she currently engages in sexual activity. She reports using the following method of birth control/protection: Surgical. The patient  has past surgical history that includes Therapeutic abortion; Therapeutic abortion; Tonsillectomy; Dilation and curettage of uterus; Finger surgery  (2003); Cervical cerclage (09/01/2011); Cervical cerclage (06/28/2012); and Bilateral salpingectomy (12/06/2012).  Her family history includes Cancer (age of onset: 28) in her father; Diabetes in her paternal grandfather; Hypertension in her mother; Mental illness in her mother; Thyroid disease in her maternal grandmother. There is no history of Anesthesia problems or Other.  Review of Systems  Constitutional: Positive for fever. Negative for chills and diaphoresis.  Respiratory: Negative for cough.   Cardiovascular: Negative for chest pain.  Gastrointestinal: Positive for nausea. Negative for heartburn, vomiting, abdominal pain and diarrhea.  Skin: Negative.   Neurological: Positive for headaches. Negative for weakness.    OBJECTIVE  Her  height is 5' 2.5" (1.588 m) and weight is 162 lb (73.483 kg). Her oral temperature is 98.1 F (36.7 C). Her blood pressure is 122/100 and her pulse is 113. Her respiration is 16 and oxygen saturation is 98%.  The patient's body mass index is 29.14 kg/(m^2).  Physical Exam  Vitals reviewed. Constitutional: She is oriented to person, place, and time. She appears well-developed and well-nourished. No distress.  HENT:  Right Ear: Tympanic membrane and ear canal normal.  Left Ear: Tympanic membrane and ear canal normal.  Mouth/Throat:    Cardiovascular: Normal rate, regular rhythm and normal heart sounds.   Respiratory: Effort normal and breath sounds normal. No respiratory distress. She has no wheezes. She has no rales. She exhibits no tenderness.  GI: Soft. Bowel sounds are normal.  Genitourinary: Vagina normal and uterus normal. No vaginal discharge found.  Neurological: She is alert and oriented to person, place, and time.  Skin: Skin is warm and dry. She is not diaphoretic.    Results for orders placed or performed in visit on 04/07/15 (from the past 24 hour(s))  POCT CBC     Status: Abnormal   Collection Time: 04/07/15  1:51 PM  Result Value Ref  Range   WBC 7.6 4.6 - 10.2 K/uL   Lymph, poc 2.3 0.6 - 3.4   POC LYMPH PERCENT 30.6 10 - 50 %L   MID (cbc) 0.5 0 - 0.9   POC MID % 6.1 0 - 12 %M   POC Granulocyte 4.8 2 - 6.9   Granulocyte percent 63.3 37 - 80 %G   RBC 5.74 (A) 4.04 - 5.48 M/uL   Hemoglobin 16.0 12.2 - 16.2 g/dL   HCT, POC 48.6 (A) 37.7 - 47.9 %   MCV 84.6 80 - 97 fL   MCH, POC 27.9 27 - 31.2 pg   MCHC 33.0 31.8 - 35.4 g/dL   RDW, POC 13.5 %   Platelet Count, POC 318 142 - 424 K/uL   MPV 7.7 0 - 99.8 fL  POCT urinalysis dipstick     Status: Abnormal   Collection Time: 04/07/15  1:55 PM  Result Value Ref Range   Color, UA yellow    Clarity, UA slightly cloudy    Glucose, UA negative    Bilirubin, UA negative    Ketones, UA negative    Spec Grav, UA 1.010    Blood, UA trace-lysed    pH, UA 7.0    Protein, UA negative    Urobilinogen, UA 0.2    Nitrite, UA negative    Leukocytes, UA Trace   POCT UA - Microscopic Only     Status: None   Collection Time: 04/07/15  1:55 PM  Result Value Ref Range   WBC, Ur, HPF, POC 0-2    RBC, urine, microscopic 0-1    Bacteria, U Microscopic 1+    Mucus, UA negative    Epithelial cells, urine per micros 2-6    Crystals, Ur, HPF, POC negative    Casts, Ur, LPF, POC negative    Yeast, UA negative   POCT Wet Prep with KOH     Status: None   Collection Time: 04/07/15  1:57 PM  Result Value Ref Range   Trichomonas, UA Negative    Clue Cells Wet Prep HPF POC 0-1    Epithelial Wet Prep HPF POC 1-6    Yeast Wet Prep HPF POC negative    Bacteria Wet Prep HPF POC 3+    RBC Wet Prep HPF POC 0-1    WBC Wet Prep HPF POC 0-4    KOH Prep POC Negative     ASSESSMENT & PLAN  [name] was seen today for infected tooth and vaginitis.  Diagnoses and all orders for this visit:  Tooth pain: Patient requesting opioid pain medication and has a history of illicit substance abuse and is reportedly clean now.  I declined to fill opioids today on these grounds.  Patient  understood. Orders: -     ketorolac (TORADOL) injection 60 mg; Inject 2 mLs (60 mg total) into the muscle once. -     naproxen (NAPROSYN) 500 MG tablet; Take 1 tablet (500 mg total) by mouth 3 (three) times daily with meals. -     acetaminophen (TYLENOL) 500 MG tablet; Take 2 tablets (1,000 mg total) by mouth every 8 (eight) hours as needed. Take 2 tabs every 8 hours.  Non-intractable cyclical vomiting with nausea Orders: -     ondansetron (ZOFRAN-ODT) disintegrating tablet 8 mg; Take 2 tablets (8 mg total) by mouth once.  Dysuria Orders: -  POCT urinalysis dipstick -     POCT UA - Microscopic Only -     POCT CBC -     POCT Wet Prep with KOH -     Urine culture  Bacterial vaginosis Orders: -     metroNIDAZOLE (FLAGYL) 500 MG tablet; Take 1 tablet (500 mg total) by mouth 3 (three) times daily.  Other orders -     fluconazole (DIFLUCAN) 150 MG tablet; Take 1 tablet (150 mg total) by mouth once. Repeat if needed    The patient was advised to call or come back to clinic if she does not see an improvement in symptoms, or worsens with the above plan.   Philis Fendt, MHS, PA-C [facility] 04/08/2015 10:29 AM

## 2015-04-08 LAB — URINE CULTURE

## 2015-04-10 ENCOUNTER — Ambulatory Visit: Payer: BLUE CROSS/BLUE SHIELD | Admitting: Family Medicine

## 2015-04-15 ENCOUNTER — Encounter: Payer: Self-pay | Admitting: Family Medicine

## 2015-04-15 ENCOUNTER — Ambulatory Visit (INDEPENDENT_AMBULATORY_CARE_PROVIDER_SITE_OTHER): Payer: BLUE CROSS/BLUE SHIELD | Admitting: Family Medicine

## 2015-04-15 VITALS — BP 130/100 | HR 87 | Temp 97.8°F | Resp 16 | Ht 62.5 in | Wt 169.0 lb

## 2015-04-15 DIAGNOSIS — F329 Major depressive disorder, single episode, unspecified: Secondary | ICD-10-CM

## 2015-04-15 DIAGNOSIS — K047 Periapical abscess without sinus: Secondary | ICD-10-CM | POA: Diagnosis not present

## 2015-04-15 DIAGNOSIS — Z87898 Personal history of other specified conditions: Secondary | ICD-10-CM

## 2015-04-15 DIAGNOSIS — J4521 Mild intermittent asthma with (acute) exacerbation: Secondary | ICD-10-CM | POA: Diagnosis not present

## 2015-04-15 DIAGNOSIS — F419 Anxiety disorder, unspecified: Secondary | ICD-10-CM

## 2015-04-15 DIAGNOSIS — I10 Essential (primary) hypertension: Secondary | ICD-10-CM | POA: Diagnosis not present

## 2015-04-15 DIAGNOSIS — B182 Chronic viral hepatitis C: Secondary | ICD-10-CM

## 2015-04-15 DIAGNOSIS — F1990 Other psychoactive substance use, unspecified, uncomplicated: Secondary | ICD-10-CM | POA: Diagnosis not present

## 2015-04-15 DIAGNOSIS — F1991 Other psychoactive substance use, unspecified, in remission: Secondary | ICD-10-CM

## 2015-04-15 DIAGNOSIS — F418 Other specified anxiety disorders: Secondary | ICD-10-CM | POA: Diagnosis not present

## 2015-04-15 LAB — COMPREHENSIVE METABOLIC PANEL
ALK PHOS: 48 U/L (ref 39–117)
ALT: 33 U/L (ref 0–35)
AST: 26 U/L (ref 0–37)
Albumin: 4.4 g/dL (ref 3.5–5.2)
BILIRUBIN TOTAL: 0.3 mg/dL (ref 0.2–1.2)
BUN: 8 mg/dL (ref 6–23)
CO2: 25 mEq/L (ref 19–32)
CREATININE: 0.5 mg/dL (ref 0.50–1.10)
Calcium: 9 mg/dL (ref 8.4–10.5)
Chloride: 99 mEq/L (ref 96–112)
GLUCOSE: 104 mg/dL — AB (ref 70–99)
POTASSIUM: 3.8 meq/L (ref 3.5–5.3)
SODIUM: 133 meq/L — AB (ref 135–145)
TOTAL PROTEIN: 6.6 g/dL (ref 6.0–8.3)

## 2015-04-15 LAB — CBC WITH DIFFERENTIAL/PLATELET
BASOS PCT: 1 % (ref 0–1)
Basophils Absolute: 0.1 10*3/uL (ref 0.0–0.1)
Eosinophils Absolute: 0.2 10*3/uL (ref 0.0–0.7)
Eosinophils Relative: 3 % (ref 0–5)
HCT: 40.8 % (ref 36.0–46.0)
HEMOGLOBIN: 14 g/dL (ref 12.0–15.0)
LYMPHS ABS: 2.7 10*3/uL (ref 0.7–4.0)
Lymphocytes Relative: 42 % (ref 12–46)
MCH: 28.5 pg (ref 26.0–34.0)
MCHC: 34.3 g/dL (ref 30.0–36.0)
MCV: 82.9 fL (ref 78.0–100.0)
MPV: 10.3 fL (ref 8.6–12.4)
Monocytes Absolute: 0.7 10*3/uL (ref 0.1–1.0)
Monocytes Relative: 10 % (ref 3–12)
NEUTROS ABS: 2.9 10*3/uL (ref 1.7–7.7)
Neutrophils Relative %: 44 % (ref 43–77)
Platelets: 266 10*3/uL (ref 150–400)
RBC: 4.92 MIL/uL (ref 3.87–5.11)
RDW: 13.5 % (ref 11.5–15.5)
WBC: 6.5 10*3/uL (ref 4.0–10.5)

## 2015-04-15 LAB — POCT URINALYSIS DIPSTICK
BILIRUBIN UA: NEGATIVE
GLUCOSE UA: NEGATIVE
Ketones, UA: NEGATIVE
Leukocytes, UA: NEGATIVE
Nitrite, UA: NEGATIVE
PH UA: 6.5
Protein, UA: NEGATIVE
RBC UA: NEGATIVE
SPEC GRAV UA: 1.01
Urobilinogen, UA: 0.2

## 2015-04-15 MED ORDER — VENLAFAXINE HCL ER 75 MG PO CP24: ORAL_CAPSULE | ORAL | Status: DC

## 2015-04-15 MED ORDER — OMEPRAZOLE 20 MG PO CPDR: 20.0000 mg | DELAYED_RELEASE_CAPSULE | Freq: Every day | ORAL | Status: DC

## 2015-04-15 MED ORDER — ALBUTEROL SULFATE HFA 108 (90 BASE) MCG/ACT IN AERS: 2.0000 | INHALATION_SPRAY | Freq: Four times a day (QID) | RESPIRATORY_TRACT | Status: DC | PRN

## 2015-04-15 MED ORDER — LISINOPRIL-HYDROCHLOROTHIAZIDE 20-25 MG PO TABS: 1.0000 | ORAL_TABLET | Freq: Every day | ORAL | Status: DC

## 2015-04-15 MED ORDER — ACETAMINOPHEN-CODEINE 300-30 MG PO TABS: 1.0000 | ORAL_TABLET | Freq: Four times a day (QID) | ORAL | Status: DC | PRN

## 2015-04-15 NOTE — Patient Instructions (Signed)
Generalized Anxiety Disorder Generalized anxiety disorder (GAD) is a mental disorder. It interferes with life functions, including relationships, work, and school. GAD is different from normal anxiety, which everyone experiences at some point in their lives in response to specific life events and activities. Normal anxiety actually helps us prepare for and get through these life events and activities. Normal anxiety goes away after the event or activity is over.  GAD causes anxiety that is not necessarily related to specific events or activities. It also causes excess anxiety in proportion to specific events or activities. The anxiety associated with GAD is also difficult to control. GAD can vary from mild to severe. People with severe GAD can have intense waves of anxiety with physical symptoms (panic attacks).  SYMPTOMS The anxiety and worry associated with GAD are difficult to control. This anxiety and worry are related to many life events and activities and also occur more days than not for 6 months or longer. People with GAD also have three or more of the following symptoms (one or more in children):  Restlessness.   Fatigue.  Difficulty concentrating.   Irritability.  Muscle tension.  Difficulty sleeping or unsatisfying sleep. DIAGNOSIS GAD is diagnosed through an assessment by your health care provider. Your health care provider will ask you questions aboutyour mood,physical symptoms, and events in your life. Your health care provider may ask you about your medical history and use of alcohol or drugs, including prescription medicines. Your health care provider may also do a physical exam and blood tests. Certain medical conditions and the use of certain substances can cause symptoms similar to those associated with GAD. Your health care provider may refer you to a mental health specialist for further evaluation. TREATMENT The following therapies are usually used to treat GAD:    Medication. Antidepressant medication usually is prescribed for long-term daily control. Antianxiety medicines may be added in severe cases, especially when panic attacks occur.   Talk therapy (psychotherapy). Certain types of talk therapy can be helpful in treating GAD by providing support, education, and guidance. A form of talk therapy called cognitive behavioral therapy can teach you healthy ways to think about and react to daily life events and activities.  Stress managementtechniques. These include yoga, meditation, and exercise and can be very helpful when they are practiced regularly. A mental health specialist can help determine which treatment is best for you. Some people see improvement with one therapy. However, other people require a combination of therapies. Document Released: 03/06/2013 Document Revised: 03/26/2014 Document Reviewed: 03/06/2013 [employer] Patient Information 2015 [employer]. This information is not intended to replace advice given to you by your health care provider. Make sure you discuss any questions you have with your health care provider.  

## 2015-04-15 NOTE — Progress Notes (Signed)
Subjective:    Patient ID: [name], female    DOB: Feb 10, 1976, 39 y.o.   MRN: [id number]  04/15/2015  Follow-up; Hypertension; Anxiety; and tooth hurting, bottom right side of mouth   HPI This 39 y.o. female presents for four month follow-up:  1.  Dental infection: presetned two weeks ago for dental extraction; too infected to pull; placed on PCN by dentist; evaluated by Philis Fendt, PA-C and refused narcotics.  Taking Ibuprofen 800mg  tid.  Taking Tylenol every six hours.  Tramadol two of those by sponsor.  Very frustrated.  2.  Anxiety:  Ongoing anxiety with panic attacks.  Mind racing.  Cannot slow down mind.  Getting ready to start school in August.  Work is stressful.  Taking Effexor 37.5mg  daily.  No energy.  Sleeping is disrupted; brain does not shut down.  Taking Melatonin and Tylenol PM.  Not sleeping all night.    3. HTN: 130s/80.  Patient reports good compliance with medication, good tolerance to medication, and good symptom control.    4.  Hepatitis C  Review of Systems  Past Medical History  Diagnosis Date  . Anxiety   . GERD (gastroesophageal reflux disease)   . STD (female)     HX of STD's  . Abnormal Pap smear 1998    had colpo  . 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 Bicornuate uterus   . Hypertension 2010  . Bicornate uterus   . Headache(784.0)   . Chlamydia 2000  . IUP (intrauterine pregnancy), incidental 13 weeks  . PONV (postoperative nausea and vomiting)   . HCV infection     HX of Hep C  . Asthma     ,as child only   no inhaler   Past Surgical History  Procedure Laterality Date  . Therapeutic abortion    . Therapeutic abortion    . Tonsillectomy    . Dilation and curettage of uterus      SAB  . Finger surgery  2003    reattachment of right forefinger  . Cervical cerclage  09/01/2011    Procedure: CERCLAGE CERVICAL;  Surgeon: Donnamae Jude, MD;  Location: [facility];  Service: Gynecology;  Laterality: N/A;  . Cervical  cerclage  06/28/2012    Procedure: CERCLAGE CERVICAL;  Surgeon: Donnamae Jude, MD;  Location: [facility];  Service: Gynecology;  Laterality: N/A;  . Bilateral salpingectomy  12/06/2012    Procedure: BILATERAL SALPINGECTOMY;  Surgeon: Mora Bellman, MD;  Location: [facility];  Service: Gynecology;  Laterality: Bilateral;  laparoscopic   Allergies  Allergen Reactions  . Vicodin [Hydrocodone-Acetaminophen] Nausea And Vomiting   Current Outpatient Prescriptions  Medication Sig Dispense Refill  . acetaminophen (TYLENOL) 500 MG tablet Take 2 tablets (1,000 mg total) by mouth every 8 (eight) hours as needed. Take 2 tabs every 8 hours. 30 tablet 0  . ibuprofen (ADVIL,MOTRIN) 800 MG tablet Take 1 tab every eight hours for pain 30 tablet 2  . lisinopril-hydrochlorothiazide (PRINZIDE,ZESTORETIC) 20-25 MG per tablet Take 1 tablet by mouth daily. 90 tablet 3  . omeprazole (PRILOSEC) 20 MG capsule TAKE ONE CAPSULE BY MOUTH EVERY DAY 30 capsule 2  . venlafaxine XR (EFFEXOR-XR) 37.5 MG 24 hr capsule TAKE 1 CAPSULE BY MOUTH DAILY WITH BREAKFAST. 30 capsule 1  . metroNIDAZOLE (FLAGYL) 500 MG tablet Take 1 tablet (500 mg total) by mouth 3 (three) times daily. (Patient not taking: Reported on 04/15/2015) 21 tablet 0  . naproxen (NAPROSYN) 500 MG  tablet Take 1 tablet (500 mg total) by mouth 3 (three) times daily with meals. (Patient not taking: Reported on 04/15/2015) 30 tablet 0   Current Facility-Administered Medications  Medication Dose Route Frequency Provider Last Rate Last Dose  . ondansetron (ZOFRAN-ODT) disintegrating tablet 8 mg  8 mg Oral Once Tereasa Coop, PA-C           Objective:    BP 130/100 mmHg  Pulse 87  Temp(Src) 97.8 F (36.6 C) (Oral)  Resp 16  Ht 5' 2.5" (1.588 m)  Wt 169 lb (76.658 kg)  BMI 30.40 kg/m2  SpO2 95%  LMP 03/25/2015 Physical Exam Results for orders placed or performed in visit on 04/07/15  Urine culture  Result Value Ref Range   Colony Count 25,000 COLONIES/ML     Organism ID, Bacteria Multiple bacterial morphotypes present, none    Organism ID, Bacteria predominant. Suggest appropriate recollection if     Organism ID, Bacteria clinically indicated.   POCT urinalysis dipstick  Result Value Ref Range   Color, UA yellow    Clarity, UA slightly cloudy    Glucose, UA negative    Bilirubin, UA negative    Ketones, UA negative    Spec Grav, UA 1.010    Blood, UA trace-lysed    pH, UA 7.0    Protein, UA negative    Urobilinogen, UA 0.2    Nitrite, UA negative    Leukocytes, UA Trace   POCT UA - Microscopic Only  Result Value Ref Range   WBC, Ur, HPF, POC 0-2    RBC, urine, microscopic 0-1    Bacteria, U Microscopic 1+    Mucus, UA negative    Epithelial cells, urine per micros 2-6    Crystals, Ur, HPF, POC negative    Casts, Ur, LPF, POC negative    Yeast, UA negative   POCT CBC  Result Value Ref Range   WBC 7.6 4.6 - 10.2 K/uL   Lymph, poc 2.3 0.6 - 3.4   POC LYMPH PERCENT 30.6 10 - 50 %L   MID (cbc) 0.5 0 - 0.9   POC MID % 6.1 0 - 12 %M   POC Granulocyte 4.8 2 - 6.9   Granulocyte percent 63.3 37 - 80 %G   RBC 5.74 (A) 4.04 - 5.48 M/uL   Hemoglobin 16.0 12.2 - 16.2 g/dL   HCT, POC 48.6 (A) 37.7 - 47.9 %   MCV 84.6 80 - 97 fL   MCH, POC 27.9 27 - 31.2 pg   MCHC 33.0 31.8 - 35.4 g/dL   RDW, POC 13.5 %   Platelet Count, POC 318 142 - 424 K/uL   MPV 7.7 0 - 99.8 fL  POCT Wet Prep with KOH  Result Value Ref Range   Trichomonas, UA Negative    Clue Cells Wet Prep HPF POC 0-1    Epithelial Wet Prep HPF POC 1-6    Yeast Wet Prep HPF POC negative    Bacteria Wet Prep HPF POC 3+    RBC Wet Prep HPF POC 0-1    WBC Wet Prep HPF POC 0-4    KOH Prep POC Negative        Assessment & Plan:  No diagnosis found.  No orders of the defined types were placed in this encounter.    No Follow-up on file.    Eriel Doyon Elayne Guerin, M.D. [facility] [address] [place], [place]  [address] [phone]  phone [phone] fax

## 2015-04-17 LAB — HCV RNA QUANT RFLX ULTRA OR GENOTYP: HCV Quantitative: NOT DETECTED IU/mL (ref <15)

## 2015-05-01 ENCOUNTER — Other Ambulatory Visit: Payer: Self-pay | Admitting: Physician Assistant

## 2015-06-21 ENCOUNTER — Ambulatory Visit (INDEPENDENT_AMBULATORY_CARE_PROVIDER_SITE_OTHER): Payer: BLUE CROSS/BLUE SHIELD | Admitting: Family Medicine

## 2015-06-21 ENCOUNTER — Telehealth: Payer: Self-pay

## 2015-06-21 VITALS — BP 132/98 | HR 88 | Temp 97.4°F | Resp 16 | Ht 62.0 in

## 2015-06-21 DIAGNOSIS — R110 Nausea: Secondary | ICD-10-CM

## 2015-06-21 DIAGNOSIS — I10 Essential (primary) hypertension: Secondary | ICD-10-CM | POA: Diagnosis not present

## 2015-06-21 DIAGNOSIS — R42 Dizziness and giddiness: Secondary | ICD-10-CM | POA: Diagnosis not present

## 2015-06-21 DIAGNOSIS — G43A0 Cyclical vomiting, not intractable: Secondary | ICD-10-CM | POA: Diagnosis not present

## 2015-06-21 LAB — POCT CBC
Granulocyte percent: 53.7 %G (ref 37–80)
HCT, POC: 40.6 % (ref 37.7–47.9)
HEMOGLOBIN: 13.8 g/dL (ref 12.2–16.2)
Lymph, poc: 2.9 (ref 0.6–3.4)
MCH, POC: 28.1 pg (ref 27–31.2)
MCHC: 34.1 g/dL (ref 31.8–35.4)
MCV: 82.6 fL (ref 80–97)
MID (cbc): 0.6 (ref 0–0.9)
MPV: 7.8 fL (ref 0–99.8)
PLATELET COUNT, POC: 276 10*3/uL (ref 142–424)
POC Granulocyte: 4.1 (ref 2–6.9)
POC LYMPH %: 38 % (ref 10–50)
POC MID %: 8.3 % (ref 0–12)
RBC: 4.91 M/uL (ref 4.04–5.48)
RDW, POC: 12.7 %
WBC: 7.7 10*3/uL (ref 4.6–10.2)

## 2015-06-21 LAB — GLUCOSE, POCT (MANUAL RESULT ENTRY): POC GLUCOSE: 77 mg/dL (ref 70–99)

## 2015-06-21 MED ORDER — ONDANSETRON 4 MG PO TBDP: 4.0000 mg | ORAL_TABLET | Freq: Once | ORAL | Status: DC

## 2015-06-21 MED ORDER — OMEPRAZOLE 20 MG PO CPDR: 20.0000 mg | DELAYED_RELEASE_CAPSULE | Freq: Every day | ORAL | Status: DC

## 2015-06-21 MED ORDER — LOSARTAN POTASSIUM-HCTZ 100-25 MG PO TABS: 1.0000 | ORAL_TABLET | Freq: Every day | ORAL | Status: DC

## 2015-06-21 NOTE — Patient Instructions (Signed)
Good to see you today- I will be in touch with your labs asap If you have any other concerning episodes certainly seek care.   Otherwise I think you may have had a panic attack today- rest, take it easy this evening Avoid excessive caffeine or nicotine

## 2015-06-21 NOTE — Progress Notes (Addendum)
[facility] [address] [place] [address] [phone]  Date:  06/21/2015   Name:  [name]   DOB:  04-16-1976   MRN:  [id number]  PCP:  No PCP Per Patient    Chief Complaint: Dizziness; Shortness of Breath; Headache; and Extremity Weakness   History of Present Illness:  [name] is a 39 y.o. very pleasant female patient who presents with the following:  Brought back urgently as she was feeling poorly.  History of HTN, anxiety.Today around 2:30 pm while on he way to work she suddenly felt dizzy and weak/ hot.  Her legs "felt like jello, I felt like I was going to pass out, and it felt like someone was squeezing my head."   She got to work, where her BP was noted to be141/ 110.  This really scared her so she came in to be seen She does not have any pain.  She has not taken any new meds, no change in her routine. Admits that she does "vape" quite a bit  She currently feels "sleepy, but I still feel dizzy. "I feel like my equilibrium is a little off."  She does not think that she overheated as she has not been out in the sun.  She had breakfast and lunch today- no problems.    Noted that she has had numerous EKGs in the past S/p salpingectomy  Patient Active Problem List   Diagnosis Date Noted  . History of intravenous drug use in remission 09/07/2011  . HCV infection   . Hypertension   . Bipolar disorder   . Bicornuate uterus     Past Medical History  Diagnosis Date  . Anxiety   . GERD (gastroesophageal reflux disease)   . STD (female)     HX of STD's  . Abnormal Pap smear 1998    had colpo  . 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 Bicornuate uterus   . Hypertension 2010  . Bicornate uterus   . Headache(784.0)   . Chlamydia 2000  . IUP (intrauterine pregnancy), incidental 13 weeks  . PONV (postoperative nausea and vomiting)   . HCV infection     HX of Hep C  . Asthma     ,as child only   no inhaler    Past  Surgical History  Procedure Laterality Date  . Therapeutic abortion    . Therapeutic abortion    . Tonsillectomy    . Dilation and curettage of uterus      SAB  . Finger surgery  2003    reattachment of right forefinger  . Cervical cerclage  09/01/2011    Procedure: CERCLAGE CERVICAL;  Surgeon: Donnamae Jude, MD;  Location: [facility];  Service: Gynecology;  Laterality: N/A;  . Cervical cerclage  06/28/2012    Procedure: CERCLAGE CERVICAL;  Surgeon: Donnamae Jude, MD;  Location: [facility];  Service: Gynecology;  Laterality: N/A;  . Bilateral salpingectomy  12/06/2012    Procedure: BILATERAL SALPINGECTOMY;  Surgeon: Mora Bellman, MD;  Location: [facility];  Service: Gynecology;  Laterality: Bilateral;  laparoscopic    History  Substance Use Topics  . Smoking status: Former Smoker -- 1.00 packs/day for 20 years    Types: Cigarettes    Start date: 05/04/2014  . Smokeless tobacco: Never Used     Comment: using the vapor only  . Alcohol Use: No     Comment: sober since 01/22/11  Family History  Problem Relation Age of Onset  . Hypertension Mother   . Mental illness Mother     depression; alot of mental illness.  . Thyroid disease Maternal Grandmother   . Diabetes Paternal Grandfather   . Anesthesia problems Neg Hx   . Other Neg Hx   . Cancer Father 33    Colon cancer age 55    Allergies  Allergen Reactions  . Vicodin [Hydrocodone-Acetaminophen] Nausea And Vomiting    Medication list has been reviewed and updated.  Current Outpatient Prescriptions on File Prior to Visit  Medication Sig Dispense Refill  . acetaminophen (TYLENOL) 500 MG tablet Take 2 tablets (1,000 mg total) by mouth every 8 (eight) hours as needed. Take 2 tabs every 8 hours. 30 tablet 0  . lisinopril-hydrochlorothiazide (PRINZIDE,ZESTORETIC) 20-25 MG per tablet Take 1 tablet by mouth daily. 90 tablet 3  . omeprazole (PRILOSEC) 20 MG capsule Take 1 capsule (20 mg total) by mouth daily. 90 capsule 3  . venlafaxine XR  (EFFEXOR-XR) 75 MG 24 hr capsule TAKE 1 CAPSULE BY MOUTH DAILY WITH BREAKFAST. 30 capsule 5   Current Facility-Administered Medications on File Prior to Visit  Medication Dose Route Frequency Provider Last Rate Last Dose  . ondansetron (ZOFRAN-ODT) disintegrating tablet 8 mg  8 mg Oral Once Tereasa Coop, PA-C        Review of Systems:  As per HPI- otherwise negative.   Physical Examination: Filed Vitals:   06/21/15 1618  BP: 140/100  Pulse: 88  Temp: 97.4 F (36.3 C)  Resp: 16   Filed Vitals:   06/21/15 1618  Height: 5\' 2"  (1.575 m)   There is no weight on file to calculate BMI. Ideal Body Weight: Weight in (lb) to have BMI = 25: 136.4  GEN: WDWN, NAD, Non-toxic, A & O x 3.  Brought to room urgently as she was shaky, nauseated HEENT: Atraumatic, Normocephalic. Neck supple. No masses, No LAD.  Bilateral TM wnl, oropharynx normal.  PEERL,EOMI.   Ears and Nose: No external deformity. CV: RRR, No M/G/R. No JVD. No thrill. No extra heart sounds. PULM: CTA B, no wheezes, crackles, rhonchi. No retractions. No resp. distress. No accessory muscle use. ABD: S, NT, ND, +BS. No rebound. No HSM. EXTR: No c/c/e NEURO Normal gait.  PSYCH: Normally interactive. Conversant. Not depressed or anxious appearing.  Calm demeanor.   EKG:SR with leftward axis, ow WNL.  No acute or concerning change  Given 4mg  of zofran ODT once for nausea  She rested for about 30 minutes in clinic and felt much better  Results for orders placed or performed in visit on 06/21/15  POCT CBC  Result Value Ref Range   WBC 7.7 4.6 - 10.2 K/uL   Lymph, poc 2.9 0.6 - 3.4   POC LYMPH PERCENT 38.0 10 - 50 %L   MID (cbc) 0.6 0 - 0.9   POC MID % 8.3 0 - 12 %M   POC Granulocyte 4.1 2 - 6.9   Granulocyte percent 53.7 37 - 80 %G   RBC 4.91 4.04 - 5.48 M/uL   Hemoglobin 13.8 12.2 - 16.2 g/dL   HCT, POC 40.6 37.7 - 47.9 %   MCV 82.6 80 - 97 fL   MCH, POC 28.1 27 - 31.2 pg   MCHC 34.1 31.8 - 35.4 g/dL   RDW, POC  12.7 %   Platelet Count, POC 276 142 - 424 K/uL   MPV 7.8 0 - 99.8 fL  POCT  glucose (manual entry)  Result Value Ref Range   POC Glucose 77 70 - 99 mg/dl    Assessment and Plan: Accelerated hypertension - Plan: TSH, EKG 12-Lead, Comprehensive metabolic panel  Dizzy - Plan: POCT CBC, POCT glucose (manual entry)  Nausea without vomiting - Plan: ondansetron (ZOFRAN-ODT) disintegrating tablet 4 mg  Here today with elevated BP and what is likely an anxiety attack.   She denies any CP, feels much better after resting in clinic.  Reassured that although we do not want her BP to be elevated persistently, a BP of 1400/100 or so is unlikely to cause her any harm acutely Will be in touch with the rest of her labs Offered ED evaluation for further testing- she declines this but will seek care if any concerning sx occur  Signed Lamar Blinks, MD  7/31: called to check on her.  The rest of her labs look fine.  She is doing ok

## 2015-06-21 NOTE — Telephone Encounter (Signed)
Call --- 1. Sent in refill for Omeprazole.  2. CHANGED lisinopril-HCTZ to Losartan-HCTZ to see if cough resolves.

## 2015-06-21 NOTE — Telephone Encounter (Signed)
Pt would like a refill on omeprazole (PRILOSEC) 20 MG capsule [[id number]]. She is also taking lisinopril-hydrochlorothiazide (PRINZIDE,ZESTORETIC) 20-25 MG per tablet [[id number]] and it gives her a dry cough. She would like something to help her with this dry cough. Please advise at [phone]

## 2015-06-21 NOTE — Telephone Encounter (Signed)
Can we Rx something for the dry cough?

## 2015-06-22 LAB — COMPREHENSIVE METABOLIC PANEL
ALT: 20 U/L (ref 6–29)
AST: 21 U/L (ref 10–30)
Albumin: 4.5 g/dL (ref 3.6–5.1)
Alkaline Phosphatase: 52 U/L (ref 33–115)
BUN: 8 mg/dL (ref 7–25)
CO2: 24 mmol/L (ref 20–31)
CREATININE: 0.68 mg/dL (ref 0.50–1.10)
Calcium: 9.7 mg/dL (ref 8.6–10.2)
Chloride: 105 mmol/L (ref 98–110)
Glucose, Bld: 79 mg/dL (ref 65–99)
Potassium: 4.2 mmol/L (ref 3.5–5.3)
Sodium: 138 mmol/L (ref 135–146)
Total Bilirubin: 0.2 mg/dL (ref 0.2–1.2)
Total Protein: 6.8 g/dL (ref 6.1–8.1)

## 2015-06-22 LAB — TSH: TSH: 0.71 u[IU]/mL (ref 0.350–4.500)

## 2015-06-23 ENCOUNTER — Encounter: Payer: Self-pay | Admitting: Family Medicine

## 2015-06-24 ENCOUNTER — Ambulatory Visit: Payer: BLUE CROSS/BLUE SHIELD | Admitting: Family Medicine

## 2015-06-24 NOTE — Telephone Encounter (Signed)
Left message for pt to call back  °

## 2015-06-24 NOTE — Telephone Encounter (Signed)
Spoke with pt, advised message from Dr. Tamala Julian

## 2015-07-09 ENCOUNTER — Ambulatory Visit (INDEPENDENT_AMBULATORY_CARE_PROVIDER_SITE_OTHER): Payer: BLUE CROSS/BLUE SHIELD | Admitting: Physician Assistant

## 2015-07-09 VITALS — BP 126/74 | HR 89 | Temp 98.3°F | Resp 18 | Ht 63.5 in | Wt 166.0 lb

## 2015-07-09 DIAGNOSIS — R51 Headache: Secondary | ICD-10-CM | POA: Diagnosis not present

## 2015-07-09 DIAGNOSIS — R0981 Nasal congestion: Secondary | ICD-10-CM

## 2015-07-09 DIAGNOSIS — R519 Headache, unspecified: Secondary | ICD-10-CM

## 2015-07-09 NOTE — Progress Notes (Signed)
   Subjective:    Patient ID: [name], female    DOB: [birth year], 39 y.o.   MRN: [id number]  Chief Complaint  Patient presents with  . Headache    sinus pressure x2 days   . Ear Pain    rt   . Facial Pain    pressure    Medications, allergies, past medical history, surgical history, family history, social history and problem list reviewed and updated.  HPI  51 yof presents with ha and sinus pressure.   Sx started 3 days ago with frontal ha behind eyes. Mild. Worsening past couple days. Not worst of life. No fevers, chills. Mild non prod cough past couple days.   Right otalgia yest. Mild diarrhea yest improved today.   Review of Systems See HPI     Objective:   Physical Exam  Constitutional: She appears well-developed and well-nourished.  Non-toxic appearance. She does not have a sickly appearance. She does not appear ill. No distress.  BP 126/74 mmHg  Pulse 89  Temp(Src) 98.3 F (36.8 C) (Oral)  Resp 18  Ht 5' 3.5" (1.613 m)  Wt 166 lb (75.297 kg)  BMI 28.94 kg/m2  SpO2 98%  LMP 07/01/2015   HENT:  Right Ear: Tympanic membrane normal.  Left Ear: Tympanic membrane normal.  Nose: Right sinus exhibits no maxillary sinus tenderness and no frontal sinus tenderness. Left sinus exhibits no maxillary sinus tenderness and no frontal sinus tenderness.  Mouth/Throat: Uvula is midline, oropharynx is clear and moist and mucous membranes are normal.  Pulmonary/Chest: Effort normal and breath sounds normal.  Lymphadenopathy:       Head (right side): No submental, no submandibular and no tonsillar adenopathy present.       Head (left side): No submental, no submandibular and no tonsillar adenopathy present.    She has no cervical adenopathy.      Assessment & Plan:   Sinus congestion  Headache, unspecified headache type --exam, vitals not concerning for bacterial etiology --suspect allergic rhinitis/viral uri leading to sinus pressure/ha - no abx at this  time --sudafed, zyrtec, fluids, rest --let us know if not improving by early next week  Julieta Gutting, PA-C Physician Assistant-Certified [facility]  07/09/2015 2:22 PM

## 2015-07-09 NOTE — Patient Instructions (Signed)
I don't think you have a bacterial infection at this time.  You have some congestion likely from allergies or a cold. Taking sudafed every 8 hours for congestion, taking a daily antihistamine like zyrtec, using a flonase nasal spray two sprays in each nostril once daily will help. Please let us know if you're not improved by early next week.   Allergic Rhinitis Allergic rhinitis is when the mucous membranes in the nose respond to allergens. Allergens are particles in the air that cause your body to have an allergic reaction. This causes you to release allergic antibodies. Through a chain of events, these eventually cause you to release histamine into the blood stream. Although meant to protect the body, it is this release of histamine that causes your discomfort, such as frequent sneezing, congestion, and an itchy, runny nose.  CAUSES  Seasonal allergic rhinitis (hay fever) is caused by pollen allergens that may come from grasses, trees, and weeds. Year-round allergic rhinitis (perennial allergic rhinitis) is caused by allergens such as house dust mites, pet dander, and mold spores.  SYMPTOMS   Nasal stuffiness (congestion).  Itchy, runny nose with sneezing and tearing of the eyes. DIAGNOSIS  Your health care provider can help you determine the allergen or allergens that trigger your symptoms. If you and your health care provider are unable to determine the allergen, skin or blood testing may be used. TREATMENT  Allergic rhinitis does not have a cure, but it can be controlled by:  Medicines and allergy shots (immunotherapy).  Avoiding the allergen. Hay fever may often be treated with antihistamines in pill or nasal spray forms. Antihistamines block the effects of histamine. There are over-the-counter medicines that may help with nasal congestion and swelling around the eyes. Check with your health care provider before taking or giving this medicine.  If avoiding the allergen or the medicine  prescribed do not work, there are many new medicines your health care provider can prescribe. Stronger medicine may be used if initial measures are ineffective. Desensitizing injections can be used if medicine and avoidance does not work. Desensitization is when a patient is given ongoing shots until the body becomes less sensitive to the allergen. Make sure you follow up with your health care provider if problems continue. HOME CARE INSTRUCTIONS It is not possible to completely avoid allergens, but you can reduce your symptoms by taking steps to limit your exposure to them. It helps to know exactly what you are allergic to so that you can avoid your specific triggers. SEEK MEDICAL CARE IF:   You have a fever.  You develop a cough that does not stop easily (persistent).  You have shortness of breath.  You start wheezing.  Symptoms interfere with normal daily activities. Document Released: 08/04/2001 Document Revised: 11/14/2013 Document Reviewed: 07/17/2013 [facility] Patient Information 2015 [place], [place]. This information is not intended to replace advice given to you by your health care provider. Make sure you discuss any questions you have with your health care provider.

## 2015-08-28 ENCOUNTER — Other Ambulatory Visit: Payer: Self-pay

## 2015-08-28 MED ORDER — VENLAFAXINE HCL ER 75 MG PO CP24: ORAL_CAPSULE | ORAL | Status: DC

## 2015-09-02 ENCOUNTER — Telehealth: Payer: Self-pay

## 2015-09-02 MED ORDER — VENLAFAXINE HCL ER 150 MG PO CP24: ORAL_CAPSULE | ORAL | Status: DC

## 2015-09-02 NOTE — Telephone Encounter (Signed)
Pt states she is really depressed and would like to know if we could up her dosage on her medication. Please call [phone]

## 2015-09-02 NOTE — Telephone Encounter (Signed)
Call --- increased Effexor XR to 150mg  daily; pt needs OV with me in upcoming 3-6 weeks; please schedule OV with me for follow-up.

## 2015-09-02 NOTE — Telephone Encounter (Signed)
venlafaxine XR (EFFEXOR-XR) 75 MG 24 hr capsule [[id number]]     Order Details     Dose, Route, Frequency: As Directed     Dispense Quantity: 90 capsule Refills: 0 Fills Remaining: 0            Sig: TAKE 1 CAPSULE BY MOUTH DAILY WITH BREAKFAST. PATIENT NEEDS OFFICE VISIT FOR ADDITIONAL REFILLS

## 2015-09-03 NOTE — Telephone Encounter (Signed)
Left message for pt to call back  °

## 2015-09-04 ENCOUNTER — Ambulatory Visit (INDEPENDENT_AMBULATORY_CARE_PROVIDER_SITE_OTHER): Payer: BLUE CROSS/BLUE SHIELD | Admitting: Family Medicine

## 2015-09-04 VITALS — BP 130/100 | HR 80 | Temp 97.9°F | Resp 20 | Ht 62.5 in | Wt 171.2 lb

## 2015-09-04 DIAGNOSIS — K219 Gastro-esophageal reflux disease without esophagitis: Secondary | ICD-10-CM

## 2015-09-04 DIAGNOSIS — F1990 Other psychoactive substance use, unspecified, uncomplicated: Secondary | ICD-10-CM | POA: Diagnosis not present

## 2015-09-04 DIAGNOSIS — N921 Excessive and frequent menstruation with irregular cycle: Secondary | ICD-10-CM

## 2015-09-04 DIAGNOSIS — Z87898 Personal history of other specified conditions: Secondary | ICD-10-CM

## 2015-09-04 DIAGNOSIS — R42 Dizziness and giddiness: Secondary | ICD-10-CM | POA: Diagnosis not present

## 2015-09-04 DIAGNOSIS — F419 Anxiety disorder, unspecified: Secondary | ICD-10-CM

## 2015-09-04 DIAGNOSIS — F418 Other specified anxiety disorders: Secondary | ICD-10-CM | POA: Diagnosis not present

## 2015-09-04 DIAGNOSIS — I10 Essential (primary) hypertension: Secondary | ICD-10-CM | POA: Diagnosis not present

## 2015-09-04 DIAGNOSIS — F1991 Other psychoactive substance use, unspecified, in remission: Secondary | ICD-10-CM

## 2015-09-04 DIAGNOSIS — F329 Major depressive disorder, single episode, unspecified: Secondary | ICD-10-CM

## 2015-09-04 LAB — POCT CBC
GRANULOCYTE PERCENT: 60 % (ref 37–80)
HCT, POC: 42.2 % (ref 37.7–47.9)
Hemoglobin: 14.1 g/dL (ref 12.2–16.2)
Lymph, poc: 3.2 (ref 0.6–3.4)
MCH: 27.6 pg (ref 27–31.2)
MCHC: 33.4 g/dL (ref 31.8–35.4)
MCV: 82.5 fL (ref 80–97)
MID (cbc): 0.6 (ref 0–0.9)
MPV: 7.9 fL (ref 0–99.8)
POC GRANULOCYTE: 5.7 (ref 2–6.9)
POC LYMPH %: 33.8 % (ref 10–50)
POC MID %: 6.2 %M (ref 0–12)
Platelet Count, POC: 305 10*3/uL (ref 142–424)
RBC: 5.12 M/uL (ref 4.04–5.48)
RDW, POC: 13.1 %
WBC: 9.5 10*3/uL (ref 4.6–10.2)

## 2015-09-04 LAB — POCT URINALYSIS DIP (MANUAL ENTRY)
BILIRUBIN UA: NEGATIVE
Glucose, UA: NEGATIVE
Ketones, POC UA: NEGATIVE
LEUKOCYTES UA: NEGATIVE
NITRITE UA: NEGATIVE
Protein Ur, POC: NEGATIVE
Spec Grav, UA: 1.015
Urobilinogen, UA: 0.2
pH, UA: 7

## 2015-09-04 LAB — GLUCOSE, POCT (MANUAL RESULT ENTRY): POC Glucose: 77 mg/dl (ref 70–99)

## 2015-09-04 LAB — POCT URINE PREGNANCY: Preg Test, Ur: NEGATIVE

## 2015-09-04 MED ORDER — LOSARTAN POTASSIUM-HCTZ 100-25 MG PO TABS: 1.0000 | ORAL_TABLET | Freq: Every day | ORAL | Status: DC

## 2015-09-04 MED ORDER — VENLAFAXINE HCL ER 150 MG PO CP24: ORAL_CAPSULE | ORAL | Status: DC

## 2015-09-04 MED ORDER — OMEPRAZOLE 20 MG PO CPDR: 20.0000 mg | DELAYED_RELEASE_CAPSULE | Freq: Every day | ORAL | Status: DC

## 2015-09-04 MED ORDER — AMLODIPINE BESYLATE 2.5 MG PO TABS
2.5000 mg | ORAL_TABLET | Freq: Every day | ORAL | Status: DC
Start: 2015-09-04 — End: 2016-03-10

## 2015-09-04 MED ORDER — HYDROXYZINE HCL 25 MG PO TABS: 12.5000 mg | ORAL_TABLET | Freq: Three times a day (TID) | ORAL | Status: DC | PRN

## 2015-09-04 NOTE — Progress Notes (Addendum)
Subjective:  This chart was scribed for Reginia Forts, MD by Thea Alken, ED Scribe. This patient was seen in room 10 and the patient's care was started at 6:23 PM.  Patient ID: [name], female    DOB: June 27, 1976, 39 y.o.   MRN: [id number]  HPI   Chief Complaint  Patient presents with  . heavy menstrual cycle    passing clots  . Dysmenorrhea  . Hypertension   HPI Comments: [name] is a 39 y.o. female with hx of substance of abuse who presents to the [facility] with multiple concerns.  Heavy menstrual bleeding.  Pt states she woke up at 330 this morning with HA and heavy menstrual bleeding with clots. States since then, she has not been feeling well, symptoms consisting of low abdominal pain, low back ache, tingling in skin and dizziness. She took 2 naproxen. States since tubal ligation in 2014, menses have been abnormal, stating some months she will have a cycle  and some month she will not. Her cycle usually last for 3-7 days. She is sexually active and has been with her partner for 3 years. She denies concern for STD.   HTN  BP elevated today at 160/120.  States she took her losartan this morning and is compliant with medication. No recent changes in BP medication. She does not check BP regularly, only when she is feeling off. She denies fever, chills.CP and blurred vision. Has cut out sodas; drinks sweet tea 1 glass per day.  Drinks fruit juice.  Drinks Rigoberto Noel a lot; no calorie. B:  Frozen sausage biscuits or smoothies (fruit, yogurt, almond milk, granola, nuts). Snack: none Lunch: leftovers, energy drinks Red Bull Snack: none Supper:  Fried food/fried chicken, rice, broccoli with cheese, water, coffee   Depression Pt has felt depressed for about 1 month. She has missed 2 days of work within the last 2 weeks due to mood. 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 At times she finds herself crying. She has trouble  getting out of bed to go to work. She has been going to her NA meetings and is almost 5 months clean. She is going back to school in January to obtain requirement for nursing. She denies thoughts of hurting herself.  Picking at hair a lot; excessive lately; onset 4th grade.  Has been interviewing for new job.    Past Medical History  Diagnosis Date  . Anxiety   . GERD (gastroesophageal reflux disease)   . STD (female)     HX of STD's  . Abnormal Pap smear 1998    had colpo  . Opiate dependence ([place])     stopped methadone 01/2011  . Bicornuate uterus   . Hypertension 2010  . Bicornate uterus   . Headache(784.0)   . Chlamydia 2000  . IUP (intrauterine pregnancy), incidental 13 weeks  . PONV (postoperative nausea and vomiting)   . HCV infection     HX of Hep C  . Asthma     ,as child only   no inhaler    Past Surgical History  Procedure Laterality Date  . Therapeutic abortion    . Therapeutic abortion    . Tonsillectomy    . Dilation and curettage of uterus      SAB  . Finger surgery  2003    reattachment of right forefinger  . Cervical cerclage  09/01/2011    Procedure: CERCLAGE CERVICAL;  Surgeon: Lavella Lemons  Virginia Crews, MD;  Location: [facility];  Service: Gynecology;  Laterality: N/A;  . Cervical cerclage  06/28/2012    Procedure: CERCLAGE CERVICAL;  Surgeon: Donnamae Jude, MD;  Location: [facility];  Service: Gynecology;  Laterality: N/A;  . Bilateral salpingectomy  12/06/2012    Procedure: BILATERAL SALPINGECTOMY;  Surgeon: Mora Bellman, MD;  Location: [facility];  Service: Gynecology;  Laterality: Bilateral;  laparoscopic  . Tubal ligation  11/23/2012       Allergies  Allergen Reactions  . Vicodin [Hydrocodone-Acetaminophen] Nausea And Vomiting   Prior to Admission medications   Medication Sig Start Date End Date Taking? Authorizing Provider  acetaminophen (TYLENOL) 500 MG tablet Take 2 tablets (1,000 mg total) by mouth every 8 (eight) hours as needed. Take 2 tabs every 8 hours. 04/07/15   Yes Tereasa Coop, PA-C  losartan-hydrochlorothiazide (HYZAAR) 100-25 MG per tablet Take 1 tablet by mouth daily. 06/21/15  Yes Wardell Honour, MD  omeprazole (PRILOSEC) 20 MG capsule Take 1 capsule (20 mg total) by mouth daily. 06/21/15  Yes Wardell Honour, MD  venlafaxine XR (EFFEXOR-XR) 150 MG 24 hr capsule TAKE 1 CAPSULE BY MOUTH DAILY WITH BREAKFAST. 09/02/15  Yes Wardell Honour, MD   Review of Systems  Constitutional: Negative for fever, chills, diaphoresis and fatigue.  Eyes: Negative for visual disturbance.  Respiratory: Negative for cough and shortness of breath.   Cardiovascular: Negative for chest pain, palpitations and leg swelling.  Gastrointestinal: Negative for nausea, vomiting, abdominal pain, diarrhea and constipation.  Endocrine: Negative for cold intolerance, heat intolerance, polydipsia, polyphagia and polyuria.  Genitourinary: Positive for vaginal bleeding, menstrual problem and pelvic pain. Negative for vaginal discharge and vaginal pain.  Neurological: Positive for headaches. Negative for dizziness, tremors, seizures, syncope, facial asymmetry, speech difficulty, weakness, light-headedness and numbness.  Psychiatric/Behavioral: Positive for dysphoric mood. Negative for suicidal ideas and self-injury. The patient is nervous/anxious.    Objective:   Physical Exam  Constitutional: She is oriented to person, place, and time. She appears well-developed and well-nourished. No distress.  HENT:  Head: Normocephalic and atraumatic.  Right Ear: External ear normal.  Left Ear: External ear normal.  Nose: Nose normal.  Mouth/Throat: Oropharynx is clear and moist.  Eyes: Conjunctivae and EOM are normal. Pupils are equal, round, and reactive to light.  Neck: Normal range of motion. Neck supple. Carotid bruit is not present. No thyromegaly present.  Cardiovascular: Normal rate, regular rhythm, normal heart sounds and intact distal pulses.  Exam reveals no gallop and no friction  rub.   No murmur heard. Pulmonary/Chest: Effort normal and breath sounds normal. She has no wheezes. She has no rales.  Abdominal: Soft. Bowel sounds are normal. She exhibits no distension and no mass. There is no tenderness. There is no rebound and no guarding.  Genitourinary: Vagina normal and uterus normal. There is no rash, tenderness, lesion or injury on the right labia. There is no rash, tenderness, lesion or injury on the left labia. Cervix exhibits no motion tenderness, no discharge and no friability. Right adnexum displays no mass, no tenderness and no fullness. Left adnexum displays no mass, no tenderness and no fullness. No vaginal discharge found.  Small amount of blood in vault; bleeding from cervical os.  Musculoskeletal: Normal range of motion.  Lymphadenopathy:    She has no cervical adenopathy.  Neurological: She is alert and oriented to person, place, and time. No cranial nerve deficit.  Skin: Skin is warm and dry. No rash noted. She is  not diaphoretic. No erythema. No pallor.  Psychiatric: She has a normal mood and affect. Her behavior is normal.  Nursing note and vitals reviewed.   Filed Vitals:   09/04/15 1756 09/04/15 1801 09/04/15 1859  BP: 140/108 160/120 130/100  Pulse: 89 89 80  Temp: 97.9 F (36.6 C)    TempSrc: Oral    Resp: 20    Height: 5' 2.5" (1.588 m)    Weight: 171 lb 4 oz (77.678 kg)    SpO2: 98%        Assessment & Plan:   1. Menorrhagia with irregular cycle   2. Essential hypertension, benign   3. Dizziness   4. Anxiety and depression   5. History of intravenous drug use in remission     1. Menorrhagia: New; improvement later today; stable H/H.  RTC if acutely worsens.  Avoid OCP at this time due to HTN. 2.  HTN: uncontrolled; add Amlodipine; continue Losartan/HCTZ; obtain labs; monitor BP closely at home; RTC 2-4 weeks. 3.  Dizziness: New. Urine pregnancy negative; H/H stable; obtain labs.  Neurologically intact; may be secondary to  elevated BP. 4.  Anxiety and depression: worsening due to work stressors and due to recent relapse of drug seeking behavior.  Increased Effexor dose in past 1-2 weeks; continue dose; continue counseling with sponsor.  Close follow-up in upcoming month.  Rx for hydroxozine for acute anxiety.  5. History of iv drug use in remission: struggled with minor relapse with recent teeth issues/pain/procedures.  Now doing better. 6. GERD: rx for Prilosec.  Orders Placed This Encounter  Procedures  . Comprehensive metabolic panel  . TSH  . POCT CBC  . POCT glucose (manual entry)  . POCT urine pregnancy  . POCT urinalysis dipstick    Meds ordered this encounter  Medications  . DISCONTD: hydrOXYzine (ATARAX/VISTARIL) 25 MG tablet    Sig: Take 0.5-1 tablets (12.5-25 mg total) by mouth 3 (three) times daily as needed for itching.    Dispense:  45 tablet    Refill:  2  . hydrOXYzine (ATARAX/VISTARIL) 25 MG tablet    Sig: Take 0.5-1 tablets (12.5-25 mg total) by mouth 3 (three) times daily as needed for itching.    Dispense:  135 tablet    Refill:  0  . losartan-hydrochlorothiazide (HYZAAR) 100-25 MG tablet    Sig: Take 1 tablet by mouth daily.    Dispense:  90 tablet    Refill:  3  . omeprazole (PRILOSEC) 20 MG capsule    Sig: Take 1 capsule (20 mg total) by mouth daily.    Dispense:  90 capsule    Refill:  3  . venlafaxine XR (EFFEXOR-XR) 150 MG 24 hr capsule    Sig: TAKE 1 CAPSULE BY MOUTH DAILY WITH BREAKFAST.    Dispense:  90 capsule    Refill:  3  . amLODipine (NORVASC) 2.5 MG tablet    Sig: Take 1 tablet (2.5 mg total) by mouth daily.    Dispense:  90 tablet    Refill:  3    By signing my name below, I, Raven Small, attest that this documentation has been prepared under the direction and in the presence of Reginia Forts, MD.  Electronically Signed: Thea Alken, ED Scribe. 09/04/2015. 6:45 PM.   I personally performed the services described in this documentation, which was scribed  in my presence. The recorded information has been reviewed and considered.  Bobbijo Holst Elayne Guerin, M.D. [facility]  [address] [place],   [id number] [phone] phone [phone] fax

## 2015-09-05 ENCOUNTER — Other Ambulatory Visit: Payer: Self-pay | Admitting: Physician Assistant

## 2015-09-05 LAB — TSH: TSH: 1.227 u[IU]/mL (ref 0.350–4.500)

## 2015-09-05 LAB — COMPREHENSIVE METABOLIC PANEL
ALT: 25 U/L (ref 6–29)
AST: 23 U/L (ref 10–30)
Albumin: 4.7 g/dL (ref 3.6–5.1)
Alkaline Phosphatase: 56 U/L (ref 33–115)
BUN: 11 mg/dL (ref 7–25)
CALCIUM: 9.9 mg/dL (ref 8.6–10.2)
CO2: 26 mmol/L (ref 20–31)
Chloride: 105 mmol/L (ref 98–110)
Creat: 0.66 mg/dL (ref 0.50–1.10)
GLUCOSE: 83 mg/dL (ref 65–99)
POTASSIUM: 3.8 mmol/L (ref 3.5–5.3)
Sodium: 138 mmol/L (ref 135–146)
Total Bilirubin: 0.4 mg/dL (ref 0.2–1.2)
Total Protein: 7.3 g/dL (ref 6.1–8.1)

## 2015-09-06 NOTE — Telephone Encounter (Signed)
Spoke with pt, she was advised of this information already.

## 2015-09-13 ENCOUNTER — Telehealth: Payer: Self-pay | Admitting: Family Medicine

## 2015-09-13 DIAGNOSIS — F329 Major depressive disorder, single episode, unspecified: Secondary | ICD-10-CM

## 2015-09-13 DIAGNOSIS — F1911 Other psychoactive substance abuse, in remission: Secondary | ICD-10-CM

## 2015-09-13 DIAGNOSIS — F419 Anxiety disorder, unspecified: Principal | ICD-10-CM

## 2015-09-13 NOTE — Telephone Encounter (Signed)
Spoke with patient --- not doing well.  Sleeping excessively.  The only thing that patient can do is pick up children from school.  Now no improvement.  Increased Effexor XR to 150mg  daily eleven days ago.  No apparent trigger. Hiding out in house. No SI.  No use of drugs  A/P: depression with anxiety:  Refer for intensive therapy as outpatient.

## 2015-09-13 NOTE — Telephone Encounter (Signed)
Pt called saying she's still very depressed despite the increase in her Effexor. She can sleep for 24hrs a day, she has no motivation to get out of bed, and doesn't want to leave her home. She's missed two days of work this week. She doesn't want to be around people and said this is the worst her depression has ever been. She's afraid she's going to lose her job. She'd like a phone call back as soon as possible.  Pt ph# [phone] Thank you.

## 2015-10-04 ENCOUNTER — Ambulatory Visit: Payer: BLUE CROSS/BLUE SHIELD | Admitting: Family Medicine

## 2015-10-10 DIAGNOSIS — F329 Major depressive disorder, single episode, unspecified: Secondary | ICD-10-CM | POA: Insufficient documentation

## 2015-10-10 DIAGNOSIS — F419 Anxiety disorder, unspecified: Secondary | ICD-10-CM

## 2015-11-13 ENCOUNTER — Other Ambulatory Visit: Payer: Self-pay | Admitting: Family Medicine

## 2016-03-10 ENCOUNTER — Encounter (HOSPITAL_COMMUNITY): Payer: Self-pay | Admitting: Emergency Medicine

## 2016-03-10 ENCOUNTER — Emergency Department (HOSPITAL_COMMUNITY)
Admission: EM | Admit: 2016-03-10 | Discharge: 2016-03-10 | Disposition: A | Discharge status: Home / Self Care | Payer: Self-pay | Attending: Emergency Medicine | Admitting: Emergency Medicine

## 2016-03-10 DIAGNOSIS — Z87891 Personal history of nicotine dependence: Secondary | ICD-10-CM | POA: Insufficient documentation

## 2016-03-10 DIAGNOSIS — I10 Essential (primary) hypertension: Secondary | ICD-10-CM | POA: Insufficient documentation

## 2016-03-10 DIAGNOSIS — K219 Gastro-esophageal reflux disease without esophagitis: Secondary | ICD-10-CM | POA: Insufficient documentation

## 2016-03-10 DIAGNOSIS — R110 Nausea: Secondary | ICD-10-CM

## 2016-03-10 DIAGNOSIS — F419 Anxiety disorder, unspecified: Secondary | ICD-10-CM | POA: Insufficient documentation

## 2016-03-10 DIAGNOSIS — Q513 Bicornate uterus: Secondary | ICD-10-CM | POA: Insufficient documentation

## 2016-03-10 DIAGNOSIS — J45909 Unspecified asthma, uncomplicated: Secondary | ICD-10-CM | POA: Insufficient documentation

## 2016-03-10 DIAGNOSIS — A599 Trichomoniasis, unspecified: Secondary | ICD-10-CM | POA: Insufficient documentation

## 2016-03-10 DIAGNOSIS — A64 Unspecified sexually transmitted disease: Secondary | ICD-10-CM | POA: Insufficient documentation

## 2016-03-10 LAB — URINE MICROSCOPIC-ADD ON: Bacteria, UA: NONE SEEN

## 2016-03-10 LAB — WET PREP, GENITAL
CLUE CELLS WET PREP: NONE SEEN
Sperm: NONE SEEN
YEAST WET PREP: NONE SEEN

## 2016-03-10 LAB — URINALYSIS, ROUTINE W REFLEX MICROSCOPIC
BILIRUBIN URINE: NEGATIVE
GLUCOSE, UA: NEGATIVE mg/dL
KETONES UR: NEGATIVE mg/dL
NITRITE: NEGATIVE
PH: 7 (ref 5.0–8.0)
Protein, ur: NEGATIVE mg/dL
SPECIFIC GRAVITY, URINE: 1.007 (ref 1.005–1.030)

## 2016-03-10 MED ORDER — AZITHROMYCIN 1 G PO PACK
1.0000 g | PACK | Freq: Once | ORAL | Status: AC
  Administered 2016-03-10: 1 g via ORAL
  Filled 2016-03-10: qty 1

## 2016-03-10 MED ORDER — CEFTRIAXONE SODIUM 250 MG IJ SOLR
250.0000 mg | Freq: Once | INTRAMUSCULAR | Status: AC
  Administered 2016-03-10: 250 mg via INTRAMUSCULAR
  Filled 2016-03-10: qty 250

## 2016-03-10 MED ORDER — METRONIDAZOLE 500 MG PO TABS
500.0000 mg | ORAL_TABLET | Freq: Once | ORAL | Status: AC
  Administered 2016-03-10: 500 mg via ORAL
  Filled 2016-03-10: qty 1

## 2016-03-10 MED ORDER — OMEPRAZOLE 20 MG PO CPDR: 20.0000 mg | DELAYED_RELEASE_CAPSULE | Freq: Every day | ORAL | Status: DC

## 2016-03-10 MED ORDER — VENLAFAXINE HCL ER 150 MG PO CP24: ORAL_CAPSULE | ORAL | Status: DC

## 2016-03-10 MED ORDER — ONDANSETRON 4 MG PO TBDP: 4.0000 mg | ORAL_TABLET | Freq: Once | ORAL | Status: DC

## 2016-03-10 MED ORDER — METRONIDAZOLE 500 MG PO TABS: 500.0000 mg | ORAL_TABLET | Freq: Three times a day (TID) | ORAL | Status: AC

## 2016-03-10 MED ORDER — AMLODIPINE BESYLATE 2.5 MG PO TABS: 2.5000 mg | ORAL_TABLET | Freq: Every day | ORAL | Status: DC

## 2016-03-10 MED ORDER — LOSARTAN POTASSIUM-HCTZ 100-25 MG PO TABS: 1.0000 | ORAL_TABLET | Freq: Every day | ORAL | Status: DC

## 2016-03-10 NOTE — ED Notes (Signed)
Per pt, states reoccurring yeast infection on and off for 3 months-not resolving with OTC meds-has had unprotected sex

## 2016-03-10 NOTE — ED Provider Notes (Signed)
CSN: [id number]     Arrival date & time 03/10/16  1256 History   First MD Initiated Contact with Patient 03/10/16 1322     Chief Complaint  Patient presents with  . Vaginitis     (Consider location/radiation/quality/duration/timing/severity/associated sxs/prior Treatment) Patient is a 40 y.o. female presenting with vaginal discharge.  Vaginal Discharge Quality:  Thick and yellow Severity:  Mild Onset quality:  Gradual Duration:  3 days Timing:  Constant Progression:  Worsening Chronicity:  Recurrent Relieved by:  None tried Worsened by:  Nothing tried Ineffective treatments:  None tried Associated symptoms: vaginal itching   Associated symptoms: no dysuria, no fever, no genital lesions, no urinary hesitancy and no vomiting     Past Medical History  Diagnosis Date  . Anxiety   . GERD (gastroesophageal reflux disease)   . STD (female)     HX of STD's  . Abnormal Pap smear 1998    had colpo  . Opiate dependence ([place])     stopped methadone 01/2011  . Bicornuate uterus   . Hypertension 2010  . Bicornate uterus   . Headache(784.0)   . Chlamydia 2000  . IUP (intrauterine pregnancy), incidental 13 weeks  . PONV (postoperative nausea and vomiting)   . HCV infection     HX of Hep C  . Asthma     ,as child only   no inhaler   Past Surgical History  Procedure Laterality Date  . Therapeutic abortion    . Therapeutic abortion    . Tonsillectomy    . Dilation and curettage of uterus      SAB  . Finger surgery  2003    reattachment of right forefinger  . Cervical cerclage  09/01/2011    Procedure: CERCLAGE CERVICAL;  Surgeon: Donnamae Jude, MD;  Location: [facility];  Service: Gynecology;  Laterality: N/A;  . Cervical cerclage  06/28/2012    Procedure: CERCLAGE CERVICAL;  Surgeon: Donnamae Jude, MD;  Location: [facility];  Service: Gynecology;  Laterality: N/A;  . Bilateral salpingectomy  12/06/2012    Procedure: BILATERAL SALPINGECTOMY;  Surgeon: Mora Bellman, MD;  Location: [facility];  Service: Gynecology;  Laterality: Bilateral;  laparoscopic  . Tubal ligation  11/23/2012   Family History  Problem Relation Age of Onset  . Hypertension Mother   . Mental illness Mother     depression; alot of mental illness.  . Thyroid disease Maternal Grandmother   . Diabetes Paternal Grandfather   . Anesthesia problems Neg Hx   . Other Neg Hx   . Cancer Father 17    Colon cancer age 67   Social History  Substance Use Topics  . Smoking status: Former Smoker -- 1.00 packs/day for 20 years    Types: Cigarettes    Start date: 05/04/2014  . Smokeless tobacco: Never Used     Comment: using the vapor only  . Alcohol Use: No     Comment: sober since 01/22/11   OB History    Gravida Para Term Preterm AB TAB SAB Ectopic Multiple Living   7 4 2 2 3 2 1  0 0 2     Review of Systems  Constitutional: Negative for fever.  Eyes: Negative for pain.  Cardiovascular: Negative for chest pain.  Gastrointestinal: Negative for vomiting.  Endocrine: Negative for polydipsia and polyuria.  Genitourinary: Positive for vaginal discharge. Negative for dysuria, hesitancy, frequency, menstrual problem and pelvic pain.  All other systems reviewed and are negative.  Allergies  Vicodin  Home Medications   Prior to Admission medications   Medication Sig Start Date End Date Taking? Authorizing Provider  Ibuprofen-Diphenhydramine Cit (IBUPROFEN PM PO) Take 1-2 tablets by mouth at bedtime as needed (sleep).   Yes Historical Provider, MD  amLODipine (NORVASC) 2.5 MG tablet Take 1 tablet (2.5 mg total) by mouth daily. 03/10/16   Merrily Pew, MD  hydrOXYzine (ATARAX/VISTARIL) 25 MG tablet Take 0.5-1 tablets (12.5-25 mg total) by mouth 3 (three) times daily as needed for itching. 09/04/15   Wardell Honour, MD  losartan-hydrochlorothiazide (HYZAAR) 100-25 MG tablet Take 1 tablet by mouth daily. 03/10/16   Merrily Pew, MD  metroNIDAZOLE (FLAGYL) 500 MG tablet Take 1 tablet (500 mg total) by mouth 3  (three) times daily. 03/10/16 03/20/16  Merrily Pew, MD  omeprazole (PRILOSEC) 20 MG capsule Take 1 capsule (20 mg total) by mouth daily. 03/10/16   Merrily Pew, MD  venlafaxine XR (EFFEXOR-XR) 150 MG 24 hr capsule TAKE 1 CAPSULE BY MOUTH DAILY WITH BREAKFAST. 03/10/16   Merrily Pew, MD   BP 155/114 mmHg  Pulse 92  Temp(Src) 98.4 F (36.9 C) (Oral)  Resp 16  SpO2 100%  LMP 02/08/2016 Physical Exam  Constitutional: She is oriented to person, place, and time. She appears well-developed and well-nourished.  HENT:  Head: Normocephalic and atraumatic.  Neck: Normal range of motion.  Cardiovascular: Normal rate and regular rhythm.   Pulmonary/Chest: No stridor. No respiratory distress.  Abdominal: She exhibits no distension.  Genitourinary: No erythema or bleeding in the vagina. No foreign body around the vagina. No signs of injury around the vagina. Vaginal discharge found.  Neurological: She is alert and oriented to person, place, and time. No cranial nerve deficit. Coordination normal.  Nursing note and vitals reviewed.   ED Course  Procedures (including critical care time) Labs Review Labs Reviewed  WET PREP, GENITAL - Abnormal; Notable for the following:    Trich, Wet Prep PRESENT (*)    WBC, Wet Prep HPF POC MODERATE (*)    All other components within normal limits  URINALYSIS, ROUTINE W REFLEX MICROSCOPIC (NOT AT [facility]) - Abnormal; Notable for the following:    Hgb urine dipstick TRACE (*)    Leukocytes, UA MODERATE (*)    All other components within normal limits  URINE MICROSCOPIC-ADD ON - Abnormal; Notable for the following:    Squamous Epithelial / LPF 6-30 (*)    All other components within normal limits  RPR  HIV ANTIBODY (ROUTINE TESTING)  GC/CHLAMYDIA PROBE AMP ([place]) NOT AT [facility]    Imaging Review No results found. I have personally reviewed and evaluated these images and lab results as part of my medical decision-making.   EKG Interpretation None       MDM   Final diagnoses:  STD (female)  Trichomonas infection    STD. Rocephin/azithro and flagyl given. Discussed no intercourse and fu for test of clear. No s/s PID.   New Prescriptions: Discharge Medication List as of 03/10/2016  2:05 PM    START taking these medications   Details  metroNIDAZOLE (FLAGYL) 500 MG tablet Take 1 tablet (500 mg total) by mouth 3 (three) times daily., Starting 03/10/2016, Until Fri 03/20/16, Print         I have personally and contemperaneously reviewed labs and imaging and used in my decision making as above.   A medical screening exam was performed and I feel the patient has had an appropriate workup for their chief  complaint at this time and likelihood of emergent condition existing is low. Their vital signs are stable. They have been counseled on decision, discharge, follow up and which symptoms necessitate immediate return to the emergency department.  They verbally stated understanding and agreement with plan and discharged in stable condition.      Merrily Pew, MD 03/10/16 [phone]

## 2016-03-11 LAB — HIV ANTIBODY (ROUTINE TESTING W REFLEX): HIV SCREEN 4TH GENERATION: NONREACTIVE

## 2016-03-11 LAB — GC/CHLAMYDIA PROBE AMP (~~LOC~~) NOT AT ARMC
Chlamydia: NEGATIVE
Neisseria Gonorrhea: NEGATIVE

## 2016-03-11 LAB — RPR: RPR Ser Ql: NONREACTIVE

## 2016-05-12 ENCOUNTER — Telehealth: Payer: Self-pay

## 2016-05-12 MED ORDER — AMLODIPINE BESYLATE 2.5 MG PO TABS: 2.5000 mg | ORAL_TABLET | Freq: Every day | ORAL | Status: DC

## 2016-05-12 MED ORDER — VENLAFAXINE HCL ER 150 MG PO CP24: ORAL_CAPSULE | ORAL | Status: DC

## 2016-05-12 NOTE — Telephone Encounter (Signed)
Pt should still have another RF at least of both, but I will re-send a 90 day of both since phones are down and I can't verify w/pharm. I will try to call pt to advise when phones are back up.

## 2016-05-12 NOTE — Telephone Encounter (Signed)
Pt is needing a refill on efexer and blood pressure medication  Best number [phone]

## 2016-05-13 MED ORDER — LOSARTAN POTASSIUM-HCTZ 100-25 MG PO TABS: 1.0000 | ORAL_TABLET | Freq: Every day | ORAL | Status: DC

## 2016-05-13 MED ORDER — AMLODIPINE BESYLATE 2.5 MG PO TABS: 2.5000 mg | ORAL_TABLET | Freq: Every day | ORAL | Status: DC

## 2016-05-13 MED ORDER — VENLAFAXINE HCL ER 150 MG PO CP24: ORAL_CAPSULE | ORAL | Status: DC

## 2016-05-13 NOTE — Telephone Encounter (Signed)
Called pt who asked for a RF of losartan also, and would like all of them sent to [place]. Done. Pt will come in to see Dr Tamala Julian before these run out. She is hopefully starting a new job soon and will have benefits again. But even if she has to pay cash, she will come for check up.

## 2016-07-13 ENCOUNTER — Emergency Department (HOSPITAL_COMMUNITY)
Admission: EM | Admit: 2016-07-13 | Discharge: 2016-07-13 | Disposition: A | Discharge status: Home / Self Care | Payer: Self-pay | Attending: Emergency Medicine | Admitting: Emergency Medicine

## 2016-07-13 ENCOUNTER — Encounter (HOSPITAL_COMMUNITY): Payer: Self-pay

## 2016-07-13 ENCOUNTER — Emergency Department (HOSPITAL_COMMUNITY): Payer: Self-pay

## 2016-07-13 DIAGNOSIS — I10 Essential (primary) hypertension: Secondary | ICD-10-CM | POA: Insufficient documentation

## 2016-07-13 DIAGNOSIS — R519 Headache, unspecified: Secondary | ICD-10-CM

## 2016-07-13 DIAGNOSIS — Z87891 Personal history of nicotine dependence: Secondary | ICD-10-CM | POA: Insufficient documentation

## 2016-07-13 DIAGNOSIS — R51 Headache: Secondary | ICD-10-CM | POA: Insufficient documentation

## 2016-07-13 DIAGNOSIS — Z791 Long term (current) use of non-steroidal anti-inflammatories (NSAID): Secondary | ICD-10-CM | POA: Insufficient documentation

## 2016-07-13 DIAGNOSIS — Z79899 Other long term (current) drug therapy: Secondary | ICD-10-CM | POA: Insufficient documentation

## 2016-07-13 DIAGNOSIS — J45909 Unspecified asthma, uncomplicated: Secondary | ICD-10-CM | POA: Insufficient documentation

## 2016-07-13 LAB — COMPREHENSIVE METABOLIC PANEL
ALT: 29 U/L (ref 14–54)
AST: 26 U/L (ref 15–41)
Albumin: 4.6 g/dL (ref 3.5–5.0)
Alkaline Phosphatase: 54 U/L (ref 38–126)
Anion gap: 9 (ref 5–15)
BUN: 9 mg/dL (ref 6–20)
CO2: 24 mmol/L (ref 22–32)
Calcium: 9.5 mg/dL (ref 8.9–10.3)
Chloride: 103 mmol/L (ref 101–111)
Creatinine, Ser: 0.58 mg/dL (ref 0.44–1.00)
GFR calc Af Amer: >60 mL/min (ref >60)
GFR calc non Af Amer: >60 mL/min (ref >60)
Glucose, Bld: 91 mg/dL (ref 65–99)
Potassium: 3.5 mmol/L (ref 3.5–5.1)
Sodium: 136 mmol/L (ref 135–145)
Total Bilirubin: 0.5 mg/dL (ref 0.3–1.2)
Total Protein: 7.6 g/dL (ref 6.5–8.1)

## 2016-07-13 LAB — CBC WITH DIFFERENTIAL/PLATELET
Basophils Absolute: 0 10*3/uL (ref 0.0–0.1)
Basophils Relative: 0 %
Eosinophils Absolute: 0.1 10*3/uL (ref 0.0–0.7)
Eosinophils Relative: 1 %
HCT: 39.4 % (ref 36.0–46.0)
Hemoglobin: 13.1 g/dL (ref 12.0–15.0)
Lymphocytes Relative: 36 %
Lymphs Abs: 2.8 10*3/uL (ref 0.7–4.0)
MCH: 26.1 pg (ref 26.0–34.0)
MCHC: 33.2 g/dL (ref 30.0–36.0)
MCV: 78.6 fL (ref 78.0–100.0)
Monocytes Absolute: 0.7 10*3/uL (ref 0.1–1.0)
Monocytes Relative: 9 %
Neutro Abs: 4.2 10*3/uL (ref 1.7–7.7)
Neutrophils Relative %: 54 %
Platelets: 318 10*3/uL (ref 150–400)
RBC: 5.01 MIL/uL (ref 3.87–5.11)
RDW: 14.2 % (ref 11.5–15.5)
WBC: 7.8 10*3/uL (ref 4.0–10.5)

## 2016-07-13 LAB — URINALYSIS, ROUTINE W REFLEX MICROSCOPIC
Bilirubin Urine: NEGATIVE
Glucose, UA: NEGATIVE mg/dL
Hgb urine dipstick: NEGATIVE
Ketones, ur: NEGATIVE mg/dL
Leukocytes, UA: NEGATIVE
Nitrite: NEGATIVE
Protein, ur: NEGATIVE mg/dL
Specific Gravity, Urine: 1.006 (ref 1.005–1.030)
pH: 6.5 (ref 5.0–8.0)

## 2016-07-13 LAB — PREGNANCY, URINE: Preg Test, Ur: NEGATIVE

## 2016-07-13 MED ORDER — SODIUM CHLORIDE 0.9 % IV BOLUS (SEPSIS)
1000.0000 mL | Freq: Once | INTRAVENOUS | Status: AC
  Administered 2016-07-13: 1000 mL via INTRAVENOUS

## 2016-07-13 MED ORDER — DIPHENHYDRAMINE HCL 50 MG/ML IJ SOLN
25.0000 mg | Freq: Once | INTRAMUSCULAR | Status: AC
  Administered 2016-07-13: 25 mg via INTRAVENOUS
  Filled 2016-07-13: qty 1

## 2016-07-13 MED ORDER — METOCLOPRAMIDE HCL 5 MG/ML IJ SOLN
10.0000 mg | Freq: Once | INTRAMUSCULAR | Status: AC
  Administered 2016-07-13: 10 mg via INTRAVENOUS
  Filled 2016-07-13: qty 2

## 2016-07-13 MED ORDER — PROCHLORPERAZINE EDISYLATE 5 MG/ML IJ SOLN
5.0000 mg | Freq: Once | INTRAMUSCULAR | Status: AC
  Administered 2016-07-13: 5 mg via INTRAVENOUS
  Filled 2016-07-13: qty 2

## 2016-07-13 NOTE — ED Notes (Signed)
Unable to collect labs patients wants IV and labs collected all at the same time

## 2016-07-13 NOTE — Discharge Instructions (Signed)
Your MRI was negative today. Your blood work (blood counts, kidney function, electrolytes) and urine were normal. He can continue taking ibuprofen as needed for your headache. Recommend doing the stretches that we discussed 3-4 times daily. You can also use moist heat on your neck 3-4 times daily alternating 20 minutes on, 20 minutes off. Please follow-up with neurology for further evaluation and treatment of your symptoms. You can follow-up with alternative behavioral solutions for further evaluation of your possible anxiety symptoms. Please return to the emergency department if you develop any new or worsening symptoms such as elevated blood pressure over [id number] systolic, neurological symptoms like you experienced 2 weeks ago, or any other concerning symptoms.

## 2016-07-13 NOTE — ED Triage Notes (Signed)
Pt states high blood pressure with headache x 2 weeks.  Pt seen by EMT 2 weeks ago for similar symptoms but did not want evaluated at this time.  Pt started having more pain yesterday. Left work and wants work note for yesterday and today.  Explained unable to give note for yesterday but will get her treated today by MD.  Some shortness of breath but denies cough and doesn't want cxr.  Pt also states she has been told in past that this could be related to anxiety.

## 2016-07-13 NOTE — ED Notes (Addendum)
Pt states she feels anxious a lot which she believes is causing her BP to rise. Pt believes that she is having anxiety attacks without realizing it. Pt stated that she has nausea but no vomiting.

## 2016-07-13 NOTE — ED Provider Notes (Signed)
[facility] DEPT Provider Note   CSN: [id number] Arrival date & time: 07/13/16  1336     History   Chief Complaint Chief Complaint  Patient presents with  . Migraine    HPI [name] is a 40 y.o. female with history of hypertension, migraines, anxiety who presents with headache. Patient also reports that she has had episodes of shortness of breath, dizziness, fatigue, nausea and a recurring headache with tension in the back or neck for the past 2 weeks. Patient thinks she is having panic attacks. Patient reports that 2 weeks ago she was seen by an ENT because she was having a similar episode with additional difficulty speaking and had a measured blood pressure of 250/190. She was told to go to the hospital but she declined and took a blood pressure medications instead. She has not had her blood pressure at that level since, and the patient takes her blood pressure at work regularly. Patient has been taking her Effexor and blood pressure medications as prescribed. Patient denies any current chest pain, shortness of breath, abdominal pain, nausea, vomiting, dysuria. Patient reports a foul odor to her urine, however she states she has been taking herbal supplements. Patient has tried family services and [facility] for her anxiety symptoms, however she is not able to do that because she cannot be seen 3 times a week as they request due to her schedule. She requests medication for her symptoms that she attributes to "panic attacks." Patient does not have insurance for 2 months and is not able to follow up with her PCP until this time .  HPI  Past Medical History:  Diagnosis Date  . Abnormal Pap smear 1998   had colpo  . Anxiety   . Asthma    ,as child only   no inhaler  . Bicornate uterus   . Bicornuate uterus   . Chlamydia 2000  . GERD (gastroesophageal reflux disease)   . HCV infection    HX of Hep C  . Headache(784.0)   . Hypertension 2010  . IUP (intrauterine pregnancy),  incidental 13 weeks  . Opiate dependence ([place])    stopped methadone 01/2011  . PONV (postoperative nausea and vomiting)   . STD (female)    HX of STD's    Patient Active Problem List   Diagnosis Date Noted  . Anxiety and depression 10/10/2015  . History of intravenous drug use in remission 09/07/2011  . HCV infection   . Hypertension   . Bipolar disorder ([place])   . Bicornuate uterus     Past Surgical History:  Procedure Laterality Date  . BILATERAL SALPINGECTOMY  12/06/2012   Procedure: BILATERAL SALPINGECTOMY;  Surgeon: Mora Bellman, MD;  Location: [facility];  Service: Gynecology;  Laterality: Bilateral;  laparoscopic  . CERVICAL CERCLAGE  09/01/2011   Procedure: CERCLAGE CERVICAL;  Surgeon: Donnamae Jude, MD;  Location: [facility];  Service: Gynecology;  Laterality: N/A;  . CERVICAL CERCLAGE  06/28/2012   Procedure: CERCLAGE CERVICAL;  Surgeon: Donnamae Jude, MD;  Location: [facility];  Service: Gynecology;  Laterality: N/A;  . DILATION AND CURETTAGE OF UTERUS     SAB  . FINGER SURGERY  2003   reattachment of right forefinger  . THERAPEUTIC ABORTION    . THERAPEUTIC ABORTION    . TONSILLECTOMY    . TUBAL LIGATION  11/23/2012    OB History    Gravida Para Term Preterm AB Living   7 4 2 2 3 2    SAB  TAB Ectopic Multiple Live Births   1 2 0 0 3       Home Medications    Prior to Admission medications   Medication Sig Start Date End Date Taking? Authorizing Provider  amLODipine (NORVASC) 2.5 MG tablet Take 1 tablet (2.5 mg total) by mouth daily. 05/13/16  Yes Wardell Honour, MD  ibuprofen (ADVIL,MOTRIN) 200 MG tablet Take 400 mg by mouth every 6 (six) hours as needed for headache or moderate pain.   Yes Historical Provider, MD  Ibuprofen-Diphenhydramine Cit (IBUPROFEN PM PO) Take 1-2 tablets by mouth at bedtime as needed (sleep).   Yes Historical Provider, MD  losartan-hydrochlorothiazide (HYZAAR) 100-25 MG tablet Take 1 tablet by mouth daily. 05/13/16  Yes Wardell Honour, MD    omeprazole (PRILOSEC) 20 MG capsule Take 1 capsule (20 mg total) by mouth daily. Patient taking differently: Take 20 mg by mouth daily as needed (heartburn).  03/10/16  Yes Merrily Pew, MD  venlafaxine XR (EFFEXOR-XR) 150 MG 24 hr capsule TAKE 1 CAPSULE BY MOUTH DAILY WITH BREAKFAST. Patient taking differently: Take 150 mg by mouth daily with breakfast.  05/13/16  Yes Wardell Honour, MD  hydrOXYzine (ATARAX/VISTARIL) 25 MG tablet Take 0.5-1 tablets (12.5-25 mg total) by mouth 3 (three) times daily as needed for itching. Patient not taking: Reported on 07/13/2016 09/04/15   Wardell Honour, MD    Family History Family History  Problem Relation Age of Onset  . Hypertension Mother   . Mental illness Mother     depression; alot of mental illness.  . Cancer Father 21    Colon cancer age 50  . Thyroid disease Maternal Grandmother   . Diabetes Paternal Grandfather   . Anesthesia problems Neg Hx   . Other Neg Hx     Social History Social History  Substance Use Topics  . Smoking status: Former Smoker    Packs/day: 1.00    Years: 20.00    Types: Cigarettes    Start date: 05/04/2014  . Smokeless tobacco: Never Used     Comment: using the vapor only  . Alcohol use No     Comment: sober since 01/22/11     Allergies   Vicodin [hydrocodone-acetaminophen]   Review of Systems Review of Systems   Physical Exam Updated Vital Signs BP 142/97 (BP Location: Left Arm)   Pulse 80   Temp 98.6 F (37 C) (Oral)   Resp 18   LMP 06/27/2016   SpO2 100%   Physical Exam  Constitutional: She appears well-developed and well-nourished. No distress.  HENT:  Head: Normocephalic and atraumatic.  Mouth/Throat: Oropharynx is clear and moist. No oropharyngeal exudate.  Eyes: Conjunctivae and EOM are normal. Pupils are equal, round, and reactive to light. Right eye exhibits no discharge. Left eye exhibits no discharge. No scleral icterus.  Neck: Normal range of motion. Neck supple. Muscular tenderness  present. No spinous process tenderness present. Carotid bruit is not present. Normal range of motion present. No thyromegaly present.    Cardiovascular: Normal rate, regular rhythm, normal heart sounds and intact distal pulses.  Exam reveals no gallop and no friction rub.   No murmur heard. Pulmonary/Chest: Effort normal and breath sounds normal. No stridor. No respiratory distress. She has no wheezes. She has no rales.  Abdominal: Soft. Bowel sounds are normal. She exhibits no distension. There is no tenderness. There is no rebound and no guarding.  Musculoskeletal: She exhibits no edema.  Lymphadenopathy:    She has no cervical  adenopathy.  Neurological: She is alert. Coordination normal.  CN 3-12 intact; normal sensation throughout; 5/5 strength in all 4 extremities; equal bilateral grip strength; no ataxia on finger to nose  Skin: Skin is warm and dry. No rash noted. She is not diaphoretic. No pallor.  Psychiatric: She has a normal mood and affect.  Nursing note and vitals reviewed.    ED Treatments / Results  Labs (all labs ordered are listed, but only abnormal results are displayed) Labs Reviewed  URINALYSIS, ROUTINE W REFLEX MICROSCOPIC (NOT AT [facility]) - Abnormal; Notable for the following:       Result Value   APPearance CLOUDY (*)    All other components within normal limits  PREGNANCY, URINE  COMPREHENSIVE METABOLIC PANEL  CBC WITH DIFFERENTIAL/PLATELET    EKG  EKG Interpretation None       Radiology Mr Brain Wo Contrast  Result Date: 07/13/2016 CLINICAL DATA:  Intermittent headache, vertigo for 2 weeks. History of hypertension. EXAM: MRI HEAD WITHOUT CONTRAST TECHNIQUE: Multiplanar, multiecho pulse sequences of the brain and surrounding structures were obtained without intravenous contrast. COMPARISON:  MRI head August 02, 2011 FINDINGS: INTRACRANIAL CONTENTS: No reduced diffusion to suggest acute ischemia. No susceptibility artifact to suggest hemorrhage. The  ventricles and sulci are normal for patient's age. No suspicious parenchymal signal, masses or mass effect. No abnormal extra-axial fluid collections. No extra-axial masses though, contrast enhanced sequences would be more sensitive. Normal major intracranial vascular flow voids present at skull base. ORBITS: The included ocular globes and orbital contents are non-suspicious. SINUSES: The mastoid air-cells and included paranasal sinuses are well-aerated. SKULL/SOFT TISSUES: No abnormal sellar expansion. No suspicious calvarial bone marrow signal. Craniocervical junction maintained. IMPRESSION: Negative MRI head without contrast, stable examination. Electronically Signed   By: Elon Alas M.D.   On: 07/13/2016 19:47    Procedures Procedures (including critical care time)  Medications Ordered in ED Medications  sodium chloride 0.9 % bolus 1,000 mL (0 mLs Intravenous Stopped 07/13/16 1838)  metoCLOPramide (REGLAN) injection 10 mg (10 mg Intravenous Given 07/13/16 1638)  prochlorperazine (COMPAZINE) injection 5 mg (5 mg Intravenous Given 07/13/16 1833)  diphenhydrAMINE (BENADRYL) injection 25 mg (25 mg Intravenous Given 07/13/16 1826)     Initial Impression / Assessment and Plan / ED Course  I have reviewed the triage vital signs and the nursing notes.  Pertinent labs & imaging results that were available during my care of the patient were reviewed by me and considered in my medical decision making (see chart for details).  Clinical Course    On reevaluation after Reglan, 1 L bolus, patient states her headache is no better. Will try Compazine. Patient declines Toradol, as it has not helped her in the past.   CBC, CMP unremarkable. UA negative, urine pregnancy negative. MRI brain w/o contrast negative. Normal neuro exam, no focal deficits. Patient afebrile. Patient's headache resolved with Compazine. Referral to neurology and [facility] for further evaluation of patient's  possible anxiety symptoms. Return precautions discussed. Patient understands and agrees with plan. Patient vitals stable throughout ED course and discharged in satisfactory condition. I discussed patient with Dr. Florina Ou who guided the patient's management and agrees with plan.  Final Clinical Impressions(s) / ED Diagnoses   Final diagnoses:  Acute nonintractable headache, unspecified headache type    New Prescriptions Discharge Medication List as of 07/13/2016  8:18 PM       Frederica Kuster, PA-C 07/13/16 2125    Shanon Rosser, MD 07/13/16 2328

## 2016-08-10 ENCOUNTER — Other Ambulatory Visit: Payer: Self-pay | Admitting: Family Medicine

## 2016-08-20 ENCOUNTER — Other Ambulatory Visit: Payer: Self-pay | Admitting: Family Medicine

## 2016-08-21 ENCOUNTER — Telehealth: Payer: Self-pay

## 2016-08-21 NOTE — Telephone Encounter (Signed)
Patient request for refill of Effexor-XR 150 mg, losartan-hydrochlorothiazide (HYZAAR) 100-25 MG tablet, amLODipine (NORVASC) 2.5 MG tablet   Patient states she is out of her blood pressure medication.   [facility] [phone] - [address], [address]   Patient states her insurance will be active the end of October and that's when she can be seen.   [phone].

## 2016-08-22 ENCOUNTER — Other Ambulatory Visit: Payer: Self-pay

## 2016-08-22 NOTE — Telephone Encounter (Signed)
Meds refused 9/18.  This message came with another request.  Patient request for refill of Effexor-XR 150 mg, losartan-hydrochlorothiazide (HYZAAR) 100-25 MG tablet, amLODipine (NORVASC) 2.5 MG table Patient states she is out of her blood pressure medication.   [facility] [phone] - [place], [address]  Patient states her insurance will be active the end of October and that's when she can be seen.   [phone].

## 2016-08-22 NOTE — Telephone Encounter (Signed)
Phone call message from pt sent to Dr. Tamala Julian with repeat refill request.

## 2016-09-16 ENCOUNTER — Encounter: Payer: Self-pay | Admitting: Emergency Medicine

## 2016-09-16 ENCOUNTER — Emergency Department
Admission: EM | Admit: 2016-09-16 | Discharge: 2016-09-17 | Disposition: A | Discharge status: Home / Self Care | Payer: Self-pay | Attending: Emergency Medicine | Admitting: Emergency Medicine

## 2016-09-16 DIAGNOSIS — T401X1A Poisoning by heroin, accidental (unintentional), initial encounter: Secondary | ICD-10-CM | POA: Insufficient documentation

## 2016-09-16 DIAGNOSIS — F17210 Nicotine dependence, cigarettes, uncomplicated: Secondary | ICD-10-CM | POA: Insufficient documentation

## 2016-09-16 DIAGNOSIS — Z791 Long term (current) use of non-steroidal anti-inflammatories (NSAID): Secondary | ICD-10-CM | POA: Insufficient documentation

## 2016-09-16 DIAGNOSIS — N289 Disorder of kidney and ureter, unspecified: Secondary | ICD-10-CM | POA: Insufficient documentation

## 2016-09-16 DIAGNOSIS — F1910 Other psychoactive substance abuse, uncomplicated: Secondary | ICD-10-CM | POA: Insufficient documentation

## 2016-09-16 DIAGNOSIS — E876 Hypokalemia: Secondary | ICD-10-CM | POA: Insufficient documentation

## 2016-09-16 DIAGNOSIS — D649 Anemia, unspecified: Secondary | ICD-10-CM | POA: Insufficient documentation

## 2016-09-16 DIAGNOSIS — F14921 Cocaine use, unspecified with intoxication delirium: Secondary | ICD-10-CM

## 2016-09-16 DIAGNOSIS — F14121 Cocaine abuse with intoxication with delirium: Secondary | ICD-10-CM | POA: Insufficient documentation

## 2016-09-16 DIAGNOSIS — J45909 Unspecified asthma, uncomplicated: Secondary | ICD-10-CM | POA: Insufficient documentation

## 2016-09-16 DIAGNOSIS — Z79899 Other long term (current) drug therapy: Secondary | ICD-10-CM | POA: Insufficient documentation

## 2016-09-16 DIAGNOSIS — I10 Essential (primary) hypertension: Secondary | ICD-10-CM | POA: Insufficient documentation

## 2016-09-16 LAB — CBC
HEMATOCRIT: 32.2 % — AB (ref 35.0–47.0)
HEMOGLOBIN: 11.2 g/dL — AB (ref 12.0–16.0)
MCH: 26.3 pg (ref 26.0–34.0)
MCHC: 34.6 g/dL (ref 32.0–36.0)
MCV: 76.1 fL — ABNORMAL LOW (ref 80.0–100.0)
Platelets: 330 10*3/uL (ref 150–440)
RBC: 4.24 MIL/uL (ref 3.80–5.20)
RDW: 14.3 % (ref 11.5–14.5)
WBC: 12.6 10*3/uL — ABNORMAL HIGH (ref 3.6–11.0)

## 2016-09-16 LAB — BASIC METABOLIC PANEL
Anion gap: 16 — ABNORMAL HIGH (ref 5–15)
BUN: 22 mg/dL — ABNORMAL HIGH (ref 6–20)
CHLORIDE: 99 mmol/L — AB (ref 101–111)
CO2: 21 mmol/L — AB (ref 22–32)
CREATININE: 1.86 mg/dL — AB (ref 0.44–1.00)
Calcium: 9.5 mg/dL (ref 8.9–10.3)
GFR calc non Af Amer: 33 mL/min — ABNORMAL LOW (ref >60)
GFR, EST AFRICAN AMERICAN: 39 mL/min — AB (ref >60)
Glucose, Bld: 116 mg/dL — ABNORMAL HIGH (ref 65–99)
POTASSIUM: 3.1 mmol/L — AB (ref 3.5–5.1)
Sodium: 136 mmol/L (ref 135–145)

## 2016-09-16 LAB — POCT PREGNANCY, URINE: PREG TEST UR: NEGATIVE

## 2016-09-16 LAB — ETHANOL: Alcohol, Ethyl (B): 6 mg/dL — ABNORMAL HIGH (ref <5)

## 2016-09-16 MED ORDER — LORAZEPAM 2 MG/ML IJ SOLN
1.0000 mg | Freq: Once | INTRAMUSCULAR | Status: AC
  Administered 2016-09-16: 1 mg via INTRAVENOUS
  Filled 2016-09-16: qty 1

## 2016-09-16 MED ORDER — LORAZEPAM 1 MG PO TABS
ORAL_TABLET | ORAL | Status: AC
  Administered 2016-09-16: 1 mg via ORAL
  Filled 2016-09-16: qty 1

## 2016-09-16 MED ORDER — POTASSIUM CHLORIDE CRYS ER 20 MEQ PO TBCR
40.0000 meq | EXTENDED_RELEASE_TABLET | Freq: Once | ORAL | Status: AC
  Administered 2016-09-16: 40 meq via ORAL
  Filled 2016-09-16: qty 2

## 2016-09-16 MED ORDER — LORAZEPAM 2 MG/ML IJ SOLN
2.0000 mg | Freq: Once | INTRAMUSCULAR | Status: AC
Start: 2016-09-16 — End: 2016-09-16
  Administered 2016-09-16: 2 mg via INTRAVENOUS
  Filled 2016-09-16: qty 1

## 2016-09-16 MED ORDER — SODIUM CHLORIDE 0.9 % IV BOLUS (SEPSIS)
1000.0000 mL | Freq: Once | INTRAVENOUS | Status: AC
  Administered 2016-09-16: 1000 mL via INTRAVENOUS

## 2016-09-16 MED ORDER — LORAZEPAM 1 MG PO TABS
1.0000 mg | ORAL_TABLET | Freq: Once | ORAL | Status: AC
  Administered 2016-09-16: 1 mg via ORAL

## 2016-09-16 NOTE — ED Triage Notes (Signed)
Ems from [facility] for overdose. Pt states that she took IV cocaine and heroin sometime today. Pt unable to stay still, restless, awake

## 2016-09-16 NOTE — ED Notes (Signed)
Pt refusing to keep blood pressure cuff on.

## 2016-09-16 NOTE — Discharge Instructions (Signed)
Please make a follow-up appointment with your primary care physician to have your blood counts evaluated, as well as her potassium rechecked. Please go straight to [facility] for detoxification from cocaine and heroin.  Turn to the emergency department if you develop severe pain, fever, lightheadedness or shortness of breath, fainting, or any other symptoms concerning to you.

## 2016-09-16 NOTE — BHH Counselor (Signed)
[facility] [name]) will arrive between 11:30 - 12 to transport patient to [facility] in [place].

## 2016-09-16 NOTE — ED Provider Notes (Signed)
[facility] Emergency Department Provider Note  ____________________________________________  Time seen: Approximately 6:56 PM  I have reviewed the triage vital signs and the nursing notes.   HISTORY  Chief Complaint Drug Overdose    HPI [name] is a 40 y.o. female with a history of polysubstance abuse brought from [facility] for overdose with intravenous cocaine and heroin earlier today. The patient reports that she has been clean of any drug or alcohol abuse for 18 months, but this morning injected heroin and cocaine, she does not know what time. Since then, she has been agitated with nausea but no vomiting, no abdominal pain or diarrhea. No known trauma. No fever. No hallucinations. No SI or HI.  LMP this month.   Past Medical History:  Diagnosis Date  . Abnormal Pap smear 1998   had colpo  . Anxiety   . Asthma    ,as child only   no inhaler  . Bicornate uterus   . Bicornuate uterus   . Chlamydia 2000  . GERD (gastroesophageal reflux disease)   . HCV infection    HX of Hep C  . Headache(784.0)   . Hypertension 2010  . IUP (intrauterine pregnancy), incidental 13 weeks  . Opiate dependence ([place])    stopped methadone 01/2011  . PONV (postoperative nausea and vomiting)   . STD (female)    HX of STD's    Patient Active Problem List   Diagnosis Date Noted  . Anxiety and depression 10/10/2015  . History of intravenous drug use in remission 09/07/2011  . HCV infection   . Hypertension   . Bipolar disorder ([place])   . Bicornuate uterus     Past Surgical History:  Procedure Laterality Date  . BILATERAL SALPINGECTOMY  12/06/2012   Procedure: BILATERAL SALPINGECTOMY;  Surgeon: Mora Bellman, MD;  Location: [facility];  Service: Gynecology;  Laterality: Bilateral;  laparoscopic  . CERVICAL CERCLAGE  09/01/2011   Procedure: CERCLAGE CERVICAL;  Surgeon: Donnamae Jude, MD;  Location: [facility];  Service: Gynecology;  Laterality: N/A;  . CERVICAL CERCLAGE   06/28/2012   Procedure: CERCLAGE CERVICAL;  Surgeon: Donnamae Jude, MD;  Location: [facility];  Service: Gynecology;  Laterality: N/A;  . DILATION AND CURETTAGE OF UTERUS     SAB  . FINGER SURGERY  2003   reattachment of right forefinger  . THERAPEUTIC ABORTION    . THERAPEUTIC ABORTION    . TONSILLECTOMY    . TUBAL LIGATION  11/23/2012    Current Outpatient Rx  . Order #: [id number] Class: Normal  . Order #: [id number] Class: Normal  . Order #: [id number] Class: Historical Med  . Order #: [id number] Class: Historical Med  . Order #: [id number] Class: Normal  . Order #: [id number] Class: Print  . Order #: [id number] Class: Normal    Allergies Vicodin [hydrocodone-acetaminophen]  Family History  Problem Relation Age of Onset  . Hypertension Mother   . Mental illness Mother     depression; alot of mental illness.  . Cancer Father 25    Colon cancer age 80  . Thyroid disease Maternal Grandmother   . Diabetes Paternal Grandfather   . Anesthesia problems Neg Hx   . Other Neg Hx     Social History Social History  Substance Use Topics  . Smoking status: Current Every Day Smoker    Packs/day: 1.00    Years: 20.00    Types: Cigarettes    Start date: 05/04/2014  . Smokeless tobacco: Never Used     Comment: using  the vapor only  . Alcohol use No     Comment: sober since 01/22/11    Review of Systems Constitutional: No fever/chills.Positive significant diaphoresis. Positive inability to sit still. Positive agitation Eyes: No visual changes. ENT: No sore throat. No congestion or rhinorrhea. Cardiovascular: Denies chest pain. Denies palpitations. Respiratory: Denies shortness of breath.  No cough. Gastrointestinal: Overweight. No abdominal pain.  No nausea, no vomiting.  No diarrhea.  No constipation. Genitourinary: Negative for dysuria. Musculoskeletal: Negative for back pain. Skin: Negative for rash. Neurological: Negative for headaches. No focal numbness, tingling or weakness.   Psychiatric:+ polysubstance abuse, + agitation, No SI/HI 10-point ROS otherwise negative.  ____________________________________________   PHYSICAL EXAM:  VITAL SIGNS: ED Triage Vitals  Enc Vitals Group     BP 09/16/16 1807 104/71     Pulse Rate 09/16/16 1807 (!) 104     Resp --      Temp 09/16/16 1807 100.3 F (37.9 C)     Temp Source 09/16/16 1807 Oral     SpO2 09/16/16 1807 98 %     Weight 09/16/16 1817 170 lb (77.1 kg)     Height 09/16/16 1817 5\' 3"  (1.6 m)     Head Circumference --      Peak Flow --      Pain Score --      Pain Loc --      Pain Edu? --      Excl. in [place]? --     Constitutional: Patient is alert, oriented, and able to answer questions. She has pressured speech and agitation and is unable to sit still in the bed. She is constantly moving her arms and legs purposefully.  + Diaphoresis. Eyes: Conjunctivae are normal.  EOMI. No scleral icterus. Head: Atraumatic. Nose: No congestion/rhinnorhea. Mouth/Throat: Mucous membranes are dry.  Neck: No stridor.  Supple.  No JVD. No meningismus. Full range of motion without pain. Cardiovascular: Fast rate, regular rhythm. No murmurs, rubs or gallops.  Respiratory: Normal respiratory effort.  No accessory muscle use or retractions. Lungs CTAB.  No wheezes, rales or ronchi. Gastrointestinal: Overweight. Soft, nontender and nondistended.  No guarding or rebound.  No peritoneal signs. Musculoskeletal: No LE edema.  Neurologic:  A&Ox3.  Speech is clear.  Face and smile are symmetric.  EOMI.  Moves all extremities well. Skin:  Skin is warm, diaphoretic and intact. No rash noted. Bruising in the right antecubital region noted. Psychiatric: Patient is agitated with pressured speech but has good insight into why she is here. She is able to answer questions appropriately and is not a harm to herself or others. She denies SI, HI, or hallucinations.  ____________________________________________   LABS (all labs ordered are  listed, but only abnormal results are displayed)  Labs Reviewed  CBC - Abnormal; Notable for the following:       Result Value   WBC 12.6 (*)    Hemoglobin 11.2 (*)    HCT 32.2 (*)    MCV 76.1 (*)    All other components within normal limits  BASIC METABOLIC PANEL - Abnormal; Notable for the following:    Potassium 3.1 (*)    Chloride 99 (*)    CO2 21 (*)    Glucose, Bld 116 (*)    BUN 22 (*)    Creatinine, Ser 1.86 (*)    GFR calc non Af Amer 33 (*)    GFR calc Af Amer 39 (*)    Anion gap 16 (*)  All other components within normal limits  ETHANOL - Abnormal; Notable for the following:    Alcohol, Ethyl (B) 6 (*)    All other components within normal limits  URINE DRUG SCREEN, QUALITATIVE ([facility] ONLY)  POC URINE PREG, ED  POCT PREGNANCY, URINE   ____________________________________________  EKG  ED ECG REPORT I, Eula Listen, the attending physician, personally viewed and interpreted this ECG.   Date: 09/16/2016  EKG Time: 1802  Rate: 104  Rhythm: sinus tachycardia  Axis: normal  Intervals:Prolonged QTc  ST&T Change: Nonspecific T-wave inversion in V1. No ST elevation. Some sinus arrhythmia.  ____________________________________________  RADIOLOGY  No results found.  ____________________________________________   PROCEDURES  Procedure(s) performed: None  Procedures  Critical Care performed: No ____________________________________________   INITIAL IMPRESSION / ASSESSMENT AND PLAN / ED COURSE  Pertinent labs & imaging results that were available during my care of the patient were reviewed by me and considered in my medical decision making (see chart for details).  40 y.o. female with a history of polysubstance abuse with acute intoxication with cocaine and heroin, agitated but with good insight and reassuring vital signs. The patient has mild to tachycardic, despite due to her agitation but I will also give her intravenous fluids. Her labs  do show some renal insufficiency and mild hypokalemia, so we will treat these with fluid and potassium. I will give her Ativan for her symptoms and symptomatic treatment. Social worker consult has been placed for detox. There are no red flag warnings that would warrant involuntary commitment at this time.  ----------------------------------------- 8:14 PM on 09/16/2016 -----------------------------------------  At this time, the patient is sleeping comfortably with normal vital signs. She did require 2 doses of Ativan for improvement in her agitation. The social worker has seen the patient and she can be transferred to [facility] for detoxification from cocaine and heroin. Once she is able to eat, drink, ambulate safely, I will plan to discharge the patient home. She will be aware that she had some renal insufficiency today and was treated with fluids but will need to have this rechecked. She also notes that she is hypokalemic and we'll have to have this rechecked as well. Today, she is also mildly anemic and I'll have her follow-up with her primary care physician for this as well. ____________________________________________  FINAL CLINICAL IMPRESSION(S) / ED DIAGNOSES  Final diagnoses:  Cocaine intoxication delirium ([place])  Accidental overdose of heroin, initial encounter  Renal insufficiency  Hypokalemia  Anemia, unspecified type    Clinical Course      NEW MEDICATIONS STARTED DURING THIS VISIT:  New Prescriptions   No medications on file      Eula Listen, MD 09/16/16 2016

## 2016-09-16 NOTE — BHH Counselor (Signed)
TTS counselor called and spoke with Delfino Lovett from  [facility]  To inquire about available detox beds.   Was informed that there a female beds available on a first come first serve basis.    [facility]  Counselor called and spoke with Lattie Haw from [facility].  Was informed that patient does not meet criteria for the program.

## 2016-09-16 NOTE — ED Notes (Signed)
Pt O2 dropped to 85% while sleeping. Even respirations noted. 2L O2 nasal cannula applied. O2 now 100%.

## 2016-09-17 DIAGNOSIS — I10 Essential (primary) hypertension: Secondary | ICD-10-CM | POA: Insufficient documentation

## 2016-09-17 DIAGNOSIS — Q513 Bicornate uterus: Secondary | ICD-10-CM | POA: Insufficient documentation

## 2016-09-17 DIAGNOSIS — R4182 Altered mental status, unspecified: Secondary | ICD-10-CM | POA: Insufficient documentation

## 2016-09-17 DIAGNOSIS — G2579 Other drug induced movement disorders: Secondary | ICD-10-CM | POA: Insufficient documentation

## 2016-09-18 ENCOUNTER — Telehealth: Payer: Self-pay

## 2016-09-22 ENCOUNTER — Other Ambulatory Visit: Payer: Self-pay | Admitting: Family Medicine

## 2016-09-24 DIAGNOSIS — F1123 Opioid dependence with withdrawal: Secondary | ICD-10-CM | POA: Insufficient documentation

## 2016-09-24 DIAGNOSIS — F1410 Cocaine abuse, uncomplicated: Secondary | ICD-10-CM | POA: Insufficient documentation

## 2016-11-03 ENCOUNTER — Emergency Department (HOSPITAL_COMMUNITY)
Admission: EM | Admit: 2016-11-03 | Discharge: 2016-11-03 | Disposition: A | Discharge status: Home / Self Care | Payer: Self-pay | Attending: Emergency Medicine | Admitting: Emergency Medicine

## 2016-11-03 ENCOUNTER — Encounter (HOSPITAL_COMMUNITY): Payer: Self-pay | Admitting: Emergency Medicine

## 2016-11-03 DIAGNOSIS — I10 Essential (primary) hypertension: Secondary | ICD-10-CM | POA: Insufficient documentation

## 2016-11-03 DIAGNOSIS — Z9114 Patient's other noncompliance with medication regimen: Secondary | ICD-10-CM

## 2016-11-03 DIAGNOSIS — F17210 Nicotine dependence, cigarettes, uncomplicated: Secondary | ICD-10-CM | POA: Insufficient documentation

## 2016-11-03 DIAGNOSIS — R519 Headache, unspecified: Secondary | ICD-10-CM

## 2016-11-03 DIAGNOSIS — Z9119 Patient's noncompliance with other medical treatment and regimen: Secondary | ICD-10-CM | POA: Insufficient documentation

## 2016-11-03 DIAGNOSIS — Z7982 Long term (current) use of aspirin: Secondary | ICD-10-CM | POA: Insufficient documentation

## 2016-11-03 DIAGNOSIS — J45909 Unspecified asthma, uncomplicated: Secondary | ICD-10-CM | POA: Insufficient documentation

## 2016-11-03 DIAGNOSIS — R51 Headache: Secondary | ICD-10-CM

## 2016-11-03 LAB — URINALYSIS, ROUTINE W REFLEX MICROSCOPIC
BILIRUBIN URINE: NEGATIVE
Bacteria, UA: NONE SEEN
Glucose, UA: NEGATIVE mg/dL
Hgb urine dipstick: NEGATIVE
Ketones, ur: NEGATIVE mg/dL
LEUKOCYTES UA: NEGATIVE
NITRITE: NEGATIVE
PH: 6 (ref 5.0–8.0)
Protein, ur: 30 mg/dL — AB
SPECIFIC GRAVITY, URINE: 1.017 (ref 1.005–1.030)

## 2016-11-03 LAB — CBC WITH DIFFERENTIAL/PLATELET
BASOS PCT: 0 %
Basophils Absolute: 0 10*3/uL (ref 0.0–0.1)
EOS ABS: 0.2 10*3/uL (ref 0.0–0.7)
EOS PCT: 2 %
HCT: 36.7 % (ref 36.0–46.0)
Hemoglobin: 11.6 g/dL — ABNORMAL LOW (ref 12.0–15.0)
Lymphocytes Relative: 29 %
Lymphs Abs: 3.1 10*3/uL (ref 0.7–4.0)
MCH: 23.7 pg — ABNORMAL LOW (ref 26.0–34.0)
MCHC: 31.6 g/dL (ref 30.0–36.0)
MCV: 74.9 fL — ABNORMAL LOW (ref 78.0–100.0)
MONO ABS: 1 10*3/uL (ref 0.1–1.0)
MONOS PCT: 10 %
Neutro Abs: 6.2 10*3/uL (ref 1.7–7.7)
Neutrophils Relative %: 59 %
Platelets: 364 10*3/uL (ref 150–400)
RBC: 4.9 MIL/uL (ref 3.87–5.11)
RDW: 14.3 % (ref 11.5–15.5)
WBC: 10.5 10*3/uL (ref 4.0–10.5)

## 2016-11-03 LAB — I-STAT CHEM 8, ED
BUN: 9 mg/dL (ref 6–20)
CALCIUM ION: 1.19 mmol/L (ref 1.15–1.40)
CREATININE: 0.7 mg/dL (ref 0.44–1.00)
Chloride: 104 mmol/L (ref 101–111)
Glucose, Bld: 86 mg/dL (ref 65–99)
HEMATOCRIT: 37 % (ref 36.0–46.0)
HEMOGLOBIN: 12.6 g/dL (ref 12.0–15.0)
Potassium: 3.8 mmol/L (ref 3.5–5.1)
SODIUM: 138 mmol/L (ref 135–145)
TCO2: 22 mmol/L (ref 0–100)

## 2016-11-03 MED ORDER — HYDROCHLOROTHIAZIDE 25 MG PO TABS
25.0000 mg | ORAL_TABLET | Freq: Every day | ORAL | Status: DC
  Administered 2016-11-03: 25 mg via ORAL
  Filled 2016-11-03: qty 1

## 2016-11-03 MED ORDER — LOSARTAN POTASSIUM 100 MG PO TABS: 100.0000 mg | ORAL_TABLET | Freq: Every day | ORAL | 1 refills | Status: DC

## 2016-11-03 MED ORDER — HYDROCHLOROTHIAZIDE 25 MG PO TABS: 25.0000 mg | ORAL_TABLET | Freq: Every day | ORAL | 1 refills | Status: DC

## 2016-11-03 MED ORDER — IBUPROFEN 200 MG PO TABS: 600.0000 mg | ORAL_TABLET | Freq: Once | ORAL | Status: DC

## 2016-11-03 MED ORDER — AMLODIPINE BESYLATE 2.5 MG PO TABS: 2.5000 mg | ORAL_TABLET | Freq: Every day | ORAL | 1 refills | Status: DC

## 2016-11-03 MED ORDER — AMLODIPINE BESYLATE 5 MG PO TABS
2.5000 mg | ORAL_TABLET | Freq: Every day | ORAL | Status: DC
  Administered 2016-11-03: 2.5 mg via ORAL
  Filled 2016-11-03: qty 1

## 2016-11-03 MED ORDER — LOSARTAN POTASSIUM 50 MG PO TABS
100.0000 mg | ORAL_TABLET | Freq: Every day | ORAL | Status: DC
  Administered 2016-11-03: 100 mg via ORAL
  Filled 2016-11-03: qty 2

## 2016-11-03 MED ORDER — LOSARTAN POTASSIUM-HCTZ 100-25 MG PO TABS: 1.0000 | ORAL_TABLET | Freq: Every day | ORAL | Status: DC

## 2016-11-03 NOTE — ED Triage Notes (Addendum)
Pt ran out of BP medication 2 weeks ago; usually takes both Losartin and Lisinopril everyday; pt started having headache 1 week ago; endorses blurred vision, weakness, and dizziness; A&Ox4 and ambulatory in triage; no other neuro deficits noted in triage  Pt wants it to be known that she cannot receive any narcotic medications due to being a recovering addict

## 2016-11-03 NOTE — Discharge Instructions (Signed)
You have been give prescriptions for you blood pressure medication Please fill these at [facility] Make an appointment with one of the clinics provided to establish primary medical care

## 2016-11-03 NOTE — Progress Notes (Signed)
Patient reports her pcp is a Dr. Tamala Julian but reports has been unable to see because she cannot afford visit cost.  [facility] spoke to patient at bedside. Patient confirms she does not have insurance living in [place].  [facility] provided patient with contact information to [facility], informed patient of services there.  EDCM also provided patient with list of pcps who accept self pay patients, list of discount pharmacies and websites needymeds.org and GoodRX.com for medication assistance, phone number to inquire about the orange card, phone number to inquire about Medicaid, phone number to inquire about the [place], financial resources in the community such as local churches, [employer], urban ministries, and dental assistance for uninsured patients.  Patient thankful for resources.  No further EDCM needs at this time.

## 2016-11-03 NOTE — ED Provider Notes (Signed)
[facility] DEPT Provider Note   CSN: [id number] Arrival date & time: 11/03/16  1920  By signing my name below, I, [name], attest that this documentation has been prepared under the direction and in the presence of [name], NP. Electronically Signed: Gwenlyn Edwards, ED Scribe. 11/03/16. 8:35 PM.  History   Chief Complaint Chief Complaint  Patient presents with  . Hypertension  . Headache   The history is provided by the patient. No language interpreter was used.   HPI Comments: [name] is a 40 y.o. female with PMHx of HTN who presents to the Emergency Department complaining of gradual onset, constant, moderate headache onset 1 week PTA. Pt reports associated hypertension, blurred vision, weakness, dizziness, shortness of breath, and mild swelling in fingers and toes. Pt reports swelling usually decreases in the morning after waking up. She ran out of blood pressure medication 2 weeks ago and has not been able to see or call Dr. Tamala Julian for a medication refill. She usually takes Losartan and Lisinopril daily and has been taking them for multiple years. Pt denies chest pain.   Past Medical History:  Diagnosis Date  . Abnormal Pap smear 1998   had colpo  . Anxiety   . Asthma    ,as child only   no inhaler  . Bicornate uterus   . Bicornuate uterus   . Chlamydia 2000  . GERD (gastroesophageal reflux disease)   . HCV infection    HX of Hep C  . Headache(784.0)   . Hypertension 2010  . IUP (intrauterine pregnancy), incidental 13 weeks  . Opiate dependence ([place])    stopped methadone 01/2011  . PONV (postoperative nausea and vomiting)   . STD (female)    HX of STD's    Patient Active Problem List   Diagnosis Date Noted  . Anxiety and depression 10/10/2015  . History of intravenous drug use in remission 09/07/2011  . HCV infection   . Hypertension   . Bipolar disorder ([place])   . Bicornuate uterus     Past Surgical History:  Procedure Laterality Date  .  BILATERAL SALPINGECTOMY  12/06/2012   Procedure: BILATERAL SALPINGECTOMY;  Surgeon: Mora Bellman, MD;  Location: [facility];  Service: Gynecology;  Laterality: Bilateral;  laparoscopic  . CERVICAL CERCLAGE  09/01/2011   Procedure: CERCLAGE CERVICAL;  Surgeon: Donnamae Jude, MD;  Location: [facility];  Service: Gynecology;  Laterality: N/A;  . CERVICAL CERCLAGE  06/28/2012   Procedure: CERCLAGE CERVICAL;  Surgeon: Donnamae Jude, MD;  Location: [facility];  Service: Gynecology;  Laterality: N/A;  . DILATION AND CURETTAGE OF UTERUS     SAB  . FINGER SURGERY  2003   reattachment of right forefinger  . THERAPEUTIC ABORTION    . THERAPEUTIC ABORTION    . TONSILLECTOMY    . TUBAL LIGATION  11/23/2012    OB History    Gravida Para Term Preterm AB Living   7 4 2 2 3 2    SAB TAB Ectopic Multiple Live Births   1 2 0 0 3       Home Medications    Prior to Admission medications   Medication Sig Start Date End Date Taking? Authorizing Provider  aspirin EC 81 MG tablet Take 81 mg by mouth daily.   Yes Historical Provider, MD  ibuprofen (ADVIL,MOTRIN) 200 MG tablet Take 400 mg by mouth every 6 (six) hours as needed for headache or moderate pain.   Yes Historical Provider, MD  losartan-hydrochlorothiazide Konrad Penta)  100-25 MG tablet TAKE 1 TABLET BY MOUTH DAILY 08/24/16  Yes Wardell Honour, MD  amLODipine (NORVASC) 2.5 MG tablet Take 1 tablet (2.5 mg total) by mouth daily. 11/03/16   [name], NP  hydrochlorothiazide (HYDRODIURIL) 25 MG tablet Take 1 tablet (25 mg total) by mouth daily. 11/03/16   [name], NP  hydrOXYzine (ATARAX/VISTARIL) 25 MG tablet Take 0.5-1 tablets (12.5-25 mg total) by mouth 3 (three) times daily as needed for itching. Patient not taking: Reported on 11/03/2016 09/04/15   Wardell Honour, MD  Ibuprofen-Diphenhydramine Cit (IBUPROFEN PM PO) Take 1-2 tablets by mouth at bedtime as needed (sleep).    Historical Provider, MD  losartan (COZAAR) 100 MG tablet Take 1 tablet (100 mg total) by  mouth daily. 11/03/16   [name], NP  losartan-hydrochlorothiazide (HYZAAR) 100-25 MG tablet Take by mouth. 08/24/16   Historical Provider, MD  omeprazole (PRILOSEC) 20 MG capsule Take 1 capsule (20 mg total) by mouth daily. Patient taking differently: Take 20 mg by mouth daily as needed (heartburn).  03/10/16   Merrily Pew, MD  venlafaxine XR (EFFEXOR-XR) 150 MG 24 hr capsule TAKE ONE CAPSULE BY MOUTH DAILY WITH BREAKFAST 08/24/16   Wardell Honour, MD    Family History Family History  Problem Relation Age of Onset  . Hypertension Mother   . Mental illness Mother     depression; alot of mental illness.  . Cancer Father 48    Colon cancer age 70  . Thyroid disease Maternal Grandmother   . Diabetes Paternal Grandfather   . Anesthesia problems Neg Hx   . Other Neg Hx     Social History Social History  Substance Use Topics  . Smoking status: Current Every Day Smoker    Packs/day: 1.00    Years: 20.00    Types: Cigarettes    Start date: 05/04/2014  . Smokeless tobacco: Never Used     Comment: using the vapor only  . Alcohol use No     Comment: sober since 01/22/11     Allergies   Vicodin [hydrocodone-acetaminophen]   Review of Systems Review of Systems  Eyes: Positive for visual disturbance.  Respiratory: Positive for shortness of breath.   Cardiovascular: Negative for chest pain.       Swelling of fingers and toes  Neurological: Positive for dizziness, weakness and headaches.  All other systems reviewed and are negative.  Physical Exam Updated Vital Signs BP (!) 190/117   Pulse 76   Temp 98.3 F (36.8 C) (Oral)   Resp 14   LMP 08/28/2016   SpO2 96%   Physical Exam  Constitutional: She is oriented to person, place, and time. She appears well-developed and well-nourished. She is active. No distress.  HENT:  Head: Normocephalic and atraumatic.  Eyes: Conjunctivae are normal.  Cardiovascular: Normal rate.   Pulmonary/Chest: Effort normal. No respiratory  distress.  Musculoskeletal: Normal range of motion.  Neurological: She is alert and oriented to person, place, and time.  Skin: Skin is warm and dry.  Psychiatric: She has a normal mood and affect. Her behavior is normal.  Nursing note and vitals reviewed.  ED Treatments / Results  DIAGNOSTIC STUDIES: Oxygen Saturation is 94% on RA, adequate by my interpretation.    COORDINATION OF CARE: 8:33 PM Discussed treatment plan with pt at bedside which includes Amlodipine, Urinalysis, CBC with differential and EKG and pt agreed to plan.  Labs (all labs ordered are listed, but only abnormal results are displayed) Labs Reviewed  CBC WITH DIFFERENTIAL/PLATELET - Abnormal; Notable for the following:       Result Value   Hemoglobin 11.6 (*)    MCV 74.9 (*)    MCH 23.7 (*)    All other components within normal limits  URINALYSIS, ROUTINE W REFLEX MICROSCOPIC - Abnormal; Notable for the following:    Protein, ur 30 (*)    Squamous Epithelial / LPF 6-30 (*)    All other components within normal limits  I-STAT CHEM 8, ED    EKG  EKG Interpretation None       Radiology No results found.  Procedures Procedures (including critical care time)  Medications Ordered in ED Medications  amLODipine (NORVASC) tablet 2.5 mg (2.5 mg Oral Given 11/03/16 2100)  losartan (COZAAR) tablet 100 mg (100 mg Oral Given 11/03/16 2054)    And  hydrochlorothiazide (HYDRODIURIL) tablet 25 mg (25 mg Oral Given 11/03/16 2054)  ibuprofen (ADVIL,MOTRIN) tablet 600 mg (not administered)     Initial Impression / Assessment and Plan / ED Course  I have reviewed the triage vital signs and the nursing notes.  Pertinent labs & imaging results that were available during my care of the patient were reviewed by me and considered in my medical decision making (see chart for details).  Clinical Course   labs EKG and UA with in normal parameters restarted on normal medications with referral to [facility] for  PCP   I personally performed the services described in this documentation, which was scribed in my presence. The recorded information has been reviewed and is accurate.  Final Clinical Impressions(s) / ED Diagnoses   Final diagnoses:  Bad headache  Hypertension, unspecified type  Hx of medication noncompliance    New Prescriptions New Prescriptions   HYDROCHLOROTHIAZIDE (HYDRODIURIL) 25 MG TABLET    Take 1 tablet (25 mg total) by mouth daily.   LOSARTAN (COZAAR) 100 MG TABLET    Take 1 tablet (100 mg total) by mouth daily.     [name], NP 11/03/16 [id number]    Varney Biles, MD 11/07/16 [id number]

## 2016-11-03 NOTE — ED Notes (Addendum)
Pt did not want to wait for motrin ordered.  States she has some at home

## 2016-11-27 ENCOUNTER — Inpatient Hospital Stay: Payer: Self-pay

## 2016-12-27 ENCOUNTER — Encounter (HOSPITAL_COMMUNITY): Payer: Self-pay | Admitting: Emergency Medicine

## 2016-12-27 ENCOUNTER — Emergency Department (HOSPITAL_COMMUNITY)
Admission: EM | Admit: 2016-12-27 | Discharge: 2016-12-27 | Disposition: A | Discharge status: Home / Self Care | Payer: Self-pay | Attending: Emergency Medicine | Admitting: Emergency Medicine

## 2016-12-27 DIAGNOSIS — F17210 Nicotine dependence, cigarettes, uncomplicated: Secondary | ICD-10-CM | POA: Insufficient documentation

## 2016-12-27 DIAGNOSIS — I10 Essential (primary) hypertension: Secondary | ICD-10-CM | POA: Insufficient documentation

## 2016-12-27 DIAGNOSIS — Z7982 Long term (current) use of aspirin: Secondary | ICD-10-CM | POA: Insufficient documentation

## 2016-12-27 DIAGNOSIS — J45909 Unspecified asthma, uncomplicated: Secondary | ICD-10-CM | POA: Insufficient documentation

## 2016-12-27 DIAGNOSIS — Z760 Encounter for issue of repeat prescription: Secondary | ICD-10-CM | POA: Insufficient documentation

## 2016-12-27 MED ORDER — AMLODIPINE BESYLATE 2.5 MG PO TABS: 2.5000 mg | ORAL_TABLET | Freq: Every day | ORAL | 0 refills | Status: DC

## 2016-12-27 MED ORDER — VENLAFAXINE HCL ER 75 MG PO CP24: 75.0000 mg | ORAL_CAPSULE | Freq: Every day | ORAL | 0 refills | Status: DC

## 2016-12-27 MED ORDER — HYDROCHLOROTHIAZIDE 25 MG PO TABS: 25.0000 mg | ORAL_TABLET | Freq: Every day | ORAL | 0 refills | Status: DC

## 2016-12-27 MED ORDER — LOSARTAN POTASSIUM 100 MG PO TABS: 100.0000 mg | ORAL_TABLET | Freq: Every day | ORAL | 0 refills | Status: DC

## 2016-12-27 NOTE — Discharge Instructions (Signed)
Please schedule appointment with behavior health or [facility] in one day for follow up on today's visit and refill on effexor. Please see attached resource guide for primary care physician or primary care available to before your insurance kicks in in May. Take medications as prescribed. Please return to the emergency department if you're having any symptoms of increased anxiety, thoughts of hurting yourself or hurting others.

## 2016-12-27 NOTE — ED Notes (Signed)
PT DISCHARGED. INSTRUCTIONS AND PRESCRIPTIONS GIVEN. AAOX4. PT IN NO APPARENT DISTRESS OR PAIN. THE OPPORTUNITY TO ASK QUESTIONS WAS PROVIDED. 
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
## 2016-12-27 NOTE — ED Triage Notes (Signed)
Patient doesn't have insurance and [facility] but [facility] get [facility]t. Patient needs refill of Effexor 75mg ,HCTZ 25mg , Larsartan 100mg , Norvasc 20.5mg .  Patient states that she hates coming here but has no other choice until May when insurance will kick in.

## 2016-12-30 ENCOUNTER — Emergency Department (HOSPITAL_COMMUNITY)
Admission: EM | Admit: 2016-12-30 | Discharge: 2016-12-30 | Disposition: A | Discharge status: Home / Self Care | Payer: Self-pay | Attending: Emergency Medicine | Admitting: Emergency Medicine

## 2016-12-30 ENCOUNTER — Encounter (HOSPITAL_COMMUNITY): Payer: Self-pay

## 2016-12-30 DIAGNOSIS — J111 Influenza due to unidentified influenza virus with other respiratory manifestations: Secondary | ICD-10-CM | POA: Insufficient documentation

## 2016-12-30 DIAGNOSIS — R05 Cough: Secondary | ICD-10-CM

## 2016-12-30 DIAGNOSIS — R059 Cough, unspecified: Secondary | ICD-10-CM

## 2016-12-30 DIAGNOSIS — F17210 Nicotine dependence, cigarettes, uncomplicated: Secondary | ICD-10-CM | POA: Insufficient documentation

## 2016-12-30 DIAGNOSIS — J45909 Unspecified asthma, uncomplicated: Secondary | ICD-10-CM | POA: Insufficient documentation

## 2016-12-30 DIAGNOSIS — I10 Essential (primary) hypertension: Secondary | ICD-10-CM | POA: Insufficient documentation

## 2016-12-30 DIAGNOSIS — Z7982 Long term (current) use of aspirin: Secondary | ICD-10-CM | POA: Insufficient documentation

## 2016-12-30 DIAGNOSIS — R0981 Nasal congestion: Secondary | ICD-10-CM

## 2016-12-30 DIAGNOSIS — Z79899 Other long term (current) drug therapy: Secondary | ICD-10-CM | POA: Insufficient documentation

## 2016-12-30 DIAGNOSIS — R69 Illness, unspecified: Secondary | ICD-10-CM

## 2016-12-30 NOTE — ED Triage Notes (Signed)
Pt states she started having fever and chills on Monday.  Feeling better but now has sore throat.  Work is requiring work note to be out while she is getting better.

## 2016-12-30 NOTE — Discharge Instructions (Signed)
Please continue drinking plenty of water and getting enough rest.  Good hand hygiene will prevent transmission to those around you.  Your symptoms should resolve in a total of 7-10 days.

## 2016-12-30 NOTE — ED Provider Notes (Signed)
[facility] DEPT Provider Note   CSN: [id number] Arrival date & time: 12/30/16  [id number]  By signing my name below, I, Higinio Plan, attest that this documentation has been prepared under the direction and in the presence of Carmon Sails, PA-C . Electronically Signed: Higinio Plan, Scribe. 12/30/2016. 10:34 AM.  History   Chief Complaint Chief Complaint  Patient presents with  . Sore Throat  . Nasal Congestion   The history is provided by the patient. No language interpreter was used.   HPI Comments: [name] is a 41 y.o. female with PMHx of opiate dependence, anxiety, and asthma, who presents to the Emergency Department complaining of sore throat and cough productive of "green and yellow" phlegm that began ~3 days ago.  Pt states she thinks she got the flu over the weekend, reports generalized body aches, subjective fever, chills, and rhinorrhea. 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  No chest pain, shortness of breath.  Pt states her symptoms are improving but does not want to go back to work around hospice patients with her cough and runny nose.   Past Medical History:  Diagnosis Date  . Abnormal Pap smear 1998   had colpo  . Anxiety   . Asthma    ,as child only   no inhaler  . Bicornate uterus   . Bicornuate uterus   . Chlamydia 2000  . GERD (gastroesophageal reflux disease)   . HCV infection    HX of Hep C  . Headache(784.0)   . Hypertension 2010  . IUP (intrauterine pregnancy), incidental 13 weeks  . Opiate dependence ([place])    stopped methadone 01/2011  . PONV (postoperative nausea and vomiting)   . STD (female)    HX of STD's    Patient Active Problem List   Diagnosis Date Noted  . Anxiety and depression 10/10/2015  . History of intravenous drug use in remission  09/07/2011  . HCV infection   . Hypertension   . Bipolar disorder ([place])   . Bicornuate uterus     Past Surgical History:  Procedure Laterality Date  . BILATERAL SALPINGECTOMY  12/06/2012   Procedure: BILATERAL SALPINGECTOMY;  Surgeon: Mora Bellman, MD;  Location: [facility];  Service: Gynecology;  Laterality: Bilateral;  laparoscopic  . CERVICAL CERCLAGE  09/01/2011   Procedure: CERCLAGE CERVICAL;  Surgeon: Donnamae Jude, MD;  Location: [facility];  Service: Gynecology;  Laterality: N/A;  . CERVICAL CERCLAGE  06/28/2012   Procedure: CERCLAGE CERVICAL;  Surgeon: Donnamae Jude, MD;  Location: [facility];  Service: Gynecology;  Laterality: N/A;  . DILATION AND CURETTAGE OF UTERUS     SAB  . FINGER SURGERY  2003   reattachment of right forefinger  . THERAPEUTIC ABORTION    . THERAPEUTIC ABORTION    . TONSILLECTOMY    . TUBAL LIGATION  11/23/2012    OB History    Gravida Para Term Preterm AB Living   7 4 2 2 3 2    SAB TAB Ectopic Multiple Live Births   1 2 0 0 3     Home Medications    Prior to Admission medications   Medication Sig Start Date End Date Taking? Authorizing Provider  amLODipine (NORVASC) 2.5 MG tablet Take 1 tablet (2.5 mg total) by mouth daily.  12/27/16 01/26/17  Francisco Manuel [place], [place]  aspirin EC 81 MG tablet Take 81 mg by mouth daily.    Historical Provider, MD  hydrochlorothiazide (HYDRODIURIL) 25 MG tablet Take 1 tablet (25 mg total) by mouth daily. 12/27/16 01/26/17  Francisco Manuel [place], [place]  hydrOXYzine (ATARAX/VISTARIL) 25 MG tablet Take 0.5-1 tablets (12.5-25 mg total) by mouth 3 (three) times daily as needed for itching. Patient not taking: Reported on 11/03/2016 09/04/15   Wardell Honour, MD  ibuprofen (ADVIL,MOTRIN) 200 MG tablet Take 400 mg by mouth every 6 (six) hours as needed for headache or moderate pain.    Historical Provider, MD  Ibuprofen-Diphenhydramine Cit (IBUPROFEN PM PO) Take 1-2 tablets by mouth at bedtime as needed (sleep).    Historical Provider, MD    losartan (COZAAR) 100 MG tablet Take 1 tablet (100 mg total) by mouth daily. 12/27/16 01/26/17  Francisco Manuel [place], PA  losartan-hydrochlorothiazide (HYZAAR) 100-25 MG tablet TAKE 1 TABLET BY MOUTH DAILY 08/24/16   Wardell Honour, MD  losartan-hydrochlorothiazide (HYZAAR) 100-25 MG tablet Take by mouth. 08/24/16   Historical Provider, MD  omeprazole (PRILOSEC) 20 MG capsule Take 1 capsule (20 mg total) by mouth daily. Patient taking differently: Take 20 mg by mouth daily as needed (heartburn).  03/10/16   Merrily Pew, MD  venlafaxine XR (EFFEXOR XR) 75 MG 24 hr capsule Take 1 capsule (75 mg total) by mouth daily with breakfast. 12/27/16 01/26/17  Bettey Costa, [place]    Family History Family History  Problem Relation Age of Onset  . Hypertension Mother   . Mental illness Mother     depression; alot of mental illness.  . Cancer Father 32    Colon cancer age 25  . Thyroid disease Maternal Grandmother   . Diabetes Paternal Grandfather   . Anesthesia problems Neg Hx   . Other Neg Hx     Social History Social History  Substance Use Topics  . Smoking status: Current Every Day Smoker    Packs/day: 1.00    Years: 20.00    Types: Cigarettes    Start date: 05/04/2014  . Smokeless tobacco: Never Used     Comment: using the vapor only  . Alcohol use No     Comment: sober since 01/22/11   Allergies   Vicodin [hydrocodone-acetaminophen]  Review of Systems Review of Systems  Constitutional: Positive for chills and fever.  HENT: Positive for congestion, rhinorrhea and sore throat. Negative for ear pain.   Respiratory: Positive for cough.   Gastrointestinal: Negative for diarrhea, nausea and vomiting.  Musculoskeletal: Positive for myalgias.   Physical Exam Updated Vital Signs BP (!) 128/103 (BP Location: Right Arm)   Pulse 106   Temp 98.1 F (36.7 C) (Oral)   Resp 17   LMP 08/28/2016   SpO2 98%   Physical Exam  Constitutional: She is oriented to person, place, and time. She  appears well-developed and well-nourished.  HENT:  Head: Normocephalic.  Mild nasal edema bilaterally  Oropharynx edema   Eyes: EOM are normal.  Neck: Normal range of motion.  Pulmonary/Chest: Effort normal.  RR within normal limits. SpO2 within normal limits.  Normal breathing effort. Patient speaking in full sentences. No pursed lip breathing. No chest wall retractions. No cyanosis. Chest wall expansion symmetric.  No chest wall tenderness. Lungs CTAB anteriorly and posteriorly without wheezing, rhonchi or crackles.  No egophony.  No JVD. No orthopnea with head of bed flat.   Abdominal: She exhibits no distension.  Musculoskeletal:  Normal range of motion.  Neurological: She is alert and oriented to person, place, and time.  Psychiatric: She has a normal mood and affect.  Nursing note and vitals reviewed.  ED Treatments / Results  DIAGNOSTIC STUDIES:  Oxygen Saturation is 98% on RA, normal by my interpretation.    COORDINATION OF CARE:  10:32 AM Discussed treatment plan with pt at bedside and pt agreed to plan.  Labs (all labs ordered are listed, but only abnormal results are displayed) Labs Reviewed - No data to display  EKG  EKG Interpretation None       Radiology No results found.  Procedures Procedures (including critical care time)  Medications Ordered in ED Medications - No data to display  Initial Impression / Assessment and Plan / ED Course  I have reviewed the triage vital signs and the nursing notes.  Pertinent labs & imaging results that were available during my care of the patient were reviewed by me and considered in my medical decision making (see chart for details).     41 year old female presents with upper respiratory infection symptoms, possibly influenza. Patient states she "got the flu over the weekend" with symptoms including generalized body aches, chills, fevers. The symptoms have since resolved and patient now reports cough and runny  nose, no more fever. 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 On exam vital signs are within normal limits, pulmonary exam benign. Patient is afebrile. Patient is past the window for treatment with Tamiflu. I suspect patient will completely recover from this viral upper respiratory infection in the next 7-10 days. Patient given work note.  Encouraged fluids and rest. Strict ED return precautions given. Patient is aware that a viral URI infection may precede the onset of bacterial bronchitis or pneumonia. Patient is aware of red flag symptoms to monitor for that would warrant return to the ED for further reevaluation.   I personally performed the services described in this documentation, which was scribed in my presence. The recorded information has been reviewed and is accurate.  Final Clinical Impressions(s) / ED Diagnoses   Final diagnoses:  Nasal congestion  Cough  Influenza-like illness    New Prescriptions New Prescriptions   No medications on file     Arlean Hopping 12/30/16 [place], MD 12/31/16 1428

## 2017-01-10 ENCOUNTER — Emergency Department (HOSPITAL_COMMUNITY): Payer: Self-pay

## 2017-01-10 ENCOUNTER — Emergency Department (HOSPITAL_COMMUNITY)
Admission: EM | Admit: 2017-01-10 | Discharge: 2017-01-10 | Discharge status: Against Medical Advice | Payer: Self-pay | Attending: Emergency Medicine | Admitting: Emergency Medicine

## 2017-01-10 ENCOUNTER — Encounter (HOSPITAL_COMMUNITY): Payer: Self-pay

## 2017-01-10 DIAGNOSIS — F17210 Nicotine dependence, cigarettes, uncomplicated: Secondary | ICD-10-CM | POA: Insufficient documentation

## 2017-01-10 DIAGNOSIS — R109 Unspecified abdominal pain: Secondary | ICD-10-CM

## 2017-01-10 DIAGNOSIS — R10819 Abdominal tenderness, unspecified site: Secondary | ICD-10-CM | POA: Insufficient documentation

## 2017-01-10 DIAGNOSIS — Z79899 Other long term (current) drug therapy: Secondary | ICD-10-CM | POA: Insufficient documentation

## 2017-01-10 DIAGNOSIS — Z5329 Procedure and treatment not carried out because of patient's decision for other reasons: Secondary | ICD-10-CM

## 2017-01-10 DIAGNOSIS — I10 Essential (primary) hypertension: Secondary | ICD-10-CM | POA: Insufficient documentation

## 2017-01-10 DIAGNOSIS — Z5320 Procedure and treatment not carried out because of patient's decision for unspecified reasons: Secondary | ICD-10-CM

## 2017-01-10 DIAGNOSIS — J45909 Unspecified asthma, uncomplicated: Secondary | ICD-10-CM | POA: Insufficient documentation

## 2017-01-10 LAB — URINALYSIS, ROUTINE W REFLEX MICROSCOPIC
Bilirubin Urine: NEGATIVE
Glucose, UA: NEGATIVE mg/dL
Hgb urine dipstick: NEGATIVE
Ketones, ur: NEGATIVE mg/dL
Leukocytes, UA: NEGATIVE
Nitrite: NEGATIVE
PROTEIN: NEGATIVE mg/dL
SPECIFIC GRAVITY, URINE: 1.003 — AB (ref 1.005–1.030)
pH: 7 (ref 5.0–8.0)

## 2017-01-10 LAB — CBC WITH DIFFERENTIAL/PLATELET
Basophils Absolute: 0 10*3/uL (ref 0.0–0.1)
Basophils Relative: 0 %
Eosinophils Absolute: 0 10*3/uL (ref 0.0–0.7)
Eosinophils Relative: 0 %
HEMATOCRIT: 37.3 % (ref 36.0–46.0)
HEMOGLOBIN: 11.7 g/dL — AB (ref 12.0–15.0)
Lymphocytes Relative: 30 %
Lymphs Abs: 2.8 10*3/uL (ref 0.7–4.0)
MCH: 22.5 pg — ABNORMAL LOW (ref 26.0–34.0)
MCHC: 31.4 g/dL (ref 30.0–36.0)
MCV: 71.6 fL — ABNORMAL LOW (ref 78.0–100.0)
MONO ABS: 0.6 10*3/uL (ref 0.1–1.0)
MONOS PCT: 7 %
NEUTROS ABS: 5.8 10*3/uL (ref 1.7–7.7)
NEUTROS PCT: 63 %
Platelets: 419 10*3/uL — ABNORMAL HIGH (ref 150–400)
RBC: 5.21 MIL/uL — ABNORMAL HIGH (ref 3.87–5.11)
RDW: 17.3 % — AB (ref 11.5–15.5)
WBC: 9.3 10*3/uL (ref 4.0–10.5)

## 2017-01-10 LAB — COMPREHENSIVE METABOLIC PANEL
ALBUMIN: 4.2 g/dL (ref 3.5–5.0)
ALK PHOS: 74 U/L (ref 38–126)
ALT: 23 U/L (ref 14–54)
ANION GAP: 10 (ref 5–15)
AST: 25 U/L (ref 15–41)
BUN: 7 mg/dL (ref 6–20)
CHLORIDE: 104 mmol/L (ref 101–111)
CO2: 24 mmol/L (ref 22–32)
CREATININE: 0.49 mg/dL (ref 0.44–1.00)
Calcium: 9.7 mg/dL (ref 8.9–10.3)
GFR calc non Af Amer: >60 mL/min (ref >60)
GLUCOSE: 107 mg/dL — AB (ref 65–99)
Potassium: 3.4 mmol/L — ABNORMAL LOW (ref 3.5–5.1)
Sodium: 138 mmol/L (ref 135–145)
Total Bilirubin: 0.6 mg/dL (ref 0.3–1.2)
Total Protein: 7.7 g/dL (ref 6.5–8.1)

## 2017-01-10 MED ORDER — KETOROLAC TROMETHAMINE 30 MG/ML IJ SOLN
30.0000 mg | Freq: Once | INTRAMUSCULAR | Status: AC
  Administered 2017-01-10: 30 mg via INTRAVENOUS
  Filled 2017-01-10: qty 1

## 2017-01-10 MED ORDER — SODIUM CHLORIDE 0.9 % IV SOLN
1000.0000 mL | INTRAVENOUS | Status: DC
Start: 2017-01-10 — End: 2017-01-10
  Administered 2017-01-10: 1000 mL via INTRAVENOUS

## 2017-01-10 MED ORDER — SODIUM CHLORIDE 0.9 % IJ SOLN
INTRAMUSCULAR | Status: AC
  Filled 2017-01-10: qty 50

## 2017-01-10 MED ORDER — IOPAMIDOL (ISOVUE-370) INJECTION 76%
INTRAVENOUS | Status: AC
  Administered 2017-01-10: 20:00:00
  Filled 2017-01-10: qty 100

## 2017-01-10 MED ORDER — SODIUM CHLORIDE 0.9 % IV SOLN
1000.0000 mL | Freq: Once | INTRAVENOUS | Status: AC
  Administered 2017-01-10: 1000 mL via INTRAVENOUS

## 2017-01-10 NOTE — ED Provider Notes (Signed)
[facility] DEPT Provider Note   CSN: [id number] Arrival date & time: 01/10/17  1543     History   Chief Complaint Chief Complaint  Patient presents with  . Flank Pain    HPI Mayah Schrader is a 41 y.o. female.  HPI Patient states she had lower back pain that started about 2 days ago. He reports that was worse on the left than the right. Initially she thought it might just be muscular strain. She reports however it has continued to worsen and get very severe. She reports the pain is moved up into her flanks and now she is concerned she may have a kidney infection. She denies pain or burning with urination. She denies vaginal discharge or bleeding. She reports she is in a monogamous relationship and does not suspect STD. She denies back injury or strain. Past Medical History:  Diagnosis Date  . Abnormal Pap smear 1998   had colpo  . Anxiety   . Asthma    ,as child only   no inhaler  . Bicornate uterus   . Bicornuate uterus   . Chlamydia 2000  . GERD (gastroesophageal reflux disease)   . HCV infection    HX of Hep C  . Headache(784.0)   . Hypertension 2010  . IUP (intrauterine pregnancy), incidental 13 weeks  . Opiate dependence ([place])    stopped methadone 01/2011  . PONV (postoperative nausea and vomiting)   . STD (female)    HX of STD's    Patient Active Problem List   Diagnosis Date Noted  . Anxiety and depression 10/10/2015  . History of intravenous drug use in remission 09/07/2011  . HCV infection   . Hypertension   . Bipolar disorder ([place])   . Bicornuate uterus     Past Surgical History:  Procedure Laterality Date  . BILATERAL SALPINGECTOMY  12/06/2012   Procedure: BILATERAL SALPINGECTOMY;  Surgeon: Mora Bellman, MD;  Location: [facility];  Service: Gynecology;  Laterality: Bilateral;  laparoscopic  . CERVICAL CERCLAGE  09/01/2011   Procedure: CERCLAGE CERVICAL;  Surgeon: Donnamae Jude, MD;  Location: [facility];  Service: Gynecology;  Laterality: N/A;  .  CERVICAL CERCLAGE  06/28/2012   Procedure: CERCLAGE CERVICAL;  Surgeon: Donnamae Jude, MD;  Location: [facility];  Service: Gynecology;  Laterality: N/A;  . DILATION AND CURETTAGE OF UTERUS     SAB  . FINGER SURGERY  2003   reattachment of right forefinger  . THERAPEUTIC ABORTION    . THERAPEUTIC ABORTION    . TONSILLECTOMY    . TUBAL LIGATION  11/23/2012    OB History    Gravida Para Term Preterm AB Living   7 4 2 2 3 2    SAB TAB Ectopic Multiple Live Births   1 2 0 0 3       Home Medications    Prior to Admission medications   Medication Sig Start Date End Date Taking? Authorizing Provider  amLODipine (NORVASC) 2.5 MG tablet Take 1 tablet (2.5 mg total) by mouth daily. 12/27/16 01/26/17 Yes Francisco Manuel [place], [place]  hydrochlorothiazide (HYDRODIURIL) 25 MG tablet Take 1 tablet (25 mg total) by mouth daily. 12/27/16 01/26/17 Yes Francisco Manuel [place], [place]  losartan (COZAAR) 100 MG tablet Take 1 tablet (100 mg total) by mouth daily. 12/27/16 01/26/17 Yes Francisco [address] [place], [place]  omeprazole (PRILOSEC) 20 MG capsule Take 1 capsule (20 mg total) by mouth daily. 03/10/16  Yes Merrily Pew, MD  venlafaxine XR (EFFEXOR XR) 75 MG  24 hr capsule Take 1 capsule (75 mg total) by mouth daily with breakfast. 12/27/16 01/26/17 Yes Bettey Costa, [place]    Family History Family History  Problem Relation Age of Onset  . Hypertension Mother   . Mental illness Mother     depression; alot of mental illness.  . Cancer Father 57    Colon cancer age 8  . Thyroid disease Maternal Grandmother   . Diabetes Paternal Grandfather   . Anesthesia problems Neg Hx   . Other Neg Hx     Social History Social History  Substance Use Topics  . Smoking status: Current Every Day Smoker    Packs/day: 0.15    Years: 20.00    Types: Cigarettes    Start date: 05/04/2014  . Smokeless tobacco: Never Used     Comment: using the vapor only  . Alcohol use No     Comment: sober since 01/22/11     Allergies   Vicodin  [hydrocodone-acetaminophen]   Review of Systems Review of Systems 10 Systems reviewed and are negative for acute change except as noted in the HPI.   Physical Exam Updated Vital Signs BP (!) 150/106 (BP Location: Left Arm)   Pulse 88   Temp 98.1 F (36.7 C) (Oral)   Resp 18   Ht 5\' 2"  (1.575 m)   Wt 170 lb (77.1 kg)   LMP 12/30/2015   SpO2 99%   BMI 31.09 kg/m   Physical Exam  Constitutional: She is oriented to person, place, and time. She appears well-developed and well-nourished. No distress.  HENT:  Head: Normocephalic and atraumatic.  Eyes: Conjunctivae and EOM are normal.  Neck: Neck supple.  Cardiovascular: Normal rate and regular rhythm.   No murmur heard. Pulmonary/Chest: Effort normal and breath sounds normal. No respiratory distress.  Abdominal: Soft. There is tenderness.  Tenderness to the far lateral flanks bilaterally. Mild suprapubic tenderness. Positive CVA tenderness. Left greater than right.  Musculoskeletal: Normal range of motion. She exhibits no edema.  Neurological: She is alert and oriented to person, place, and time. She exhibits normal muscle tone. Coordination normal.  Skin: Skin is warm and dry.  Psychiatric: She has a normal mood and affect.  Nursing note and vitals reviewed.    ED Treatments / Results  Labs (all labs ordered are listed, but only abnormal results are displayed) Labs Reviewed  URINALYSIS, ROUTINE W REFLEX MICROSCOPIC - Abnormal; Notable for the following:       Result Value   Color, Urine STRAW (*)    APPearance HAZY (*)    Specific Gravity, Urine 1.003 (*)    All other components within normal limits  COMPREHENSIVE METABOLIC PANEL - Abnormal; Notable for the following:    Potassium 3.4 (*)    Glucose, Bld 107 (*)    All other components within normal limits  CBC WITH DIFFERENTIAL/PLATELET - Abnormal; Notable for the following:    RBC 5.21 (*)    Hemoglobin 11.7 (*)    MCV 71.6 (*)    MCH 22.5 (*)    RDW 17.3 (*)      Platelets 419 (*)    All other components within normal limits  WET PREP, GENITAL  I-STAT BETA HCG BLOOD, ED ([facility], [facility], [facility] ONLY)  GC/CHLAMYDIA PROBE AMP ([place]) NOT AT [facility]    EKG  EKG Interpretation None       Radiology No results found.  Procedures Procedures (including critical care time)  Medications Ordered in ED Medications  0.9 %  sodium chloride infusion (0 mLs Intravenous Stopped 01/10/17 1937)    Followed by  0.9 %  sodium chloride infusion (1,000 mLs Intravenous New Bag/Given 01/10/17 1750)  sodium chloride 0.9 % injection (  Handoff 01/10/17 1936)  iopamidol (ISOVUE-370) 76 % injection (not administered)  ketorolac (TORADOL) 30 MG/ML injection 30 mg (30 mg Intravenous Given 01/10/17 1751)     Initial Impression / Assessment and Plan / ED Course  I have reviewed the triage vital signs and the nursing notes.  Pertinent labs & imaging results that were available during my care of the patient were reviewed by me and considered in my medical decision making (see chart for details).      Final Clinical Impressions(s) / ED Diagnoses   Final diagnoses:  Flank pain  Left against medical advice   Patient presented with lower back pain and flank pain. She had nonsurgical abdominal examination. Plan was to proceed with CT scan to rule out kidney stone and pelvic examination for possible pelvic etiology of discomfort although she did not have abnormal bleeding or vaginal discharge. She was given Toradol for pain control. The patient had made requests for other pain medication during the course of her workup. Due to history of IV drug use in remission, I felt best management was to avoid narcotic use unless specific etiology for pain was identified. Patient's pain was somewhat atypical for kidney stone in that it was not localized to one flank and classic severe quality. Patient was offered oral acetaminophen. She reports she wanted to sign out AMA.  New  Prescriptions New Prescriptions   No medications on file     Charlesetta Shanks, MD 01/10/17 2011

## 2017-01-10 NOTE — ED Triage Notes (Signed)
Patient c/o bilateral flank pain that began 2 days ago.L> R .

## 2017-01-10 NOTE — ED Notes (Signed)
BEDSIDE REPORT WITH [facility]SC[facility] RN. AWARE OF CT PENDING, PAIN MEDICATION AND PELVIC PENDING. ADDRESSED WITH PT AT BEDSIDE.

## 2017-02-05 ENCOUNTER — Ambulatory Visit: Payer: Self-pay | Attending: Internal Medicine | Admitting: Physician Assistant

## 2017-02-05 VITALS — BP 135/100 | HR 90 | Temp 98.5°F | Resp 18 | Ht 62.0 in | Wt 172.8 lb

## 2017-02-05 DIAGNOSIS — I10 Essential (primary) hypertension: Secondary | ICD-10-CM | POA: Insufficient documentation

## 2017-02-05 DIAGNOSIS — Z79899 Other long term (current) drug therapy: Secondary | ICD-10-CM | POA: Insufficient documentation

## 2017-02-05 DIAGNOSIS — K219 Gastro-esophageal reflux disease without esophagitis: Secondary | ICD-10-CM | POA: Insufficient documentation

## 2017-02-05 DIAGNOSIS — Z7689 Persons encountering health services in other specified circumstances: Secondary | ICD-10-CM | POA: Insufficient documentation

## 2017-02-05 DIAGNOSIS — Z885 Allergy status to narcotic agent status: Secondary | ICD-10-CM | POA: Insufficient documentation

## 2017-02-05 DIAGNOSIS — F419 Anxiety disorder, unspecified: Secondary | ICD-10-CM | POA: Insufficient documentation

## 2017-02-05 MED ORDER — AMLODIPINE BESYLATE 5 MG PO TABS: 5.0000 mg | ORAL_TABLET | Freq: Every day | ORAL | 3 refills | Status: DC

## 2017-02-05 MED ORDER — LOSARTAN POTASSIUM 100 MG PO TABS: 100.0000 mg | ORAL_TABLET | Freq: Every day | ORAL | 1 refills | Status: DC

## 2017-02-05 MED ORDER — VENLAFAXINE HCL ER 75 MG PO CP24: 75.0000 mg | ORAL_CAPSULE | Freq: Every day | ORAL | 1 refills | Status: DC

## 2017-02-05 MED ORDER — HYDROCHLOROTHIAZIDE 25 MG PO TABS: 25.0000 mg | ORAL_TABLET | Freq: Every day | ORAL | 1 refills | Status: DC

## 2017-02-05 MED ORDER — OMEPRAZOLE 20 MG PO CPDR: 20.0000 mg | DELAYED_RELEASE_CAPSULE | Freq: Every day | ORAL | 5 refills | Status: DC

## 2017-02-05 MED FILL — ?AMLODIPINE BESYLATE 5 MG T: 5 | 30 days supply | Qty: 30 | Fill #0

## 2017-02-05 MED FILL — HYDROCHLOROTHIAZIDE 25 MG T: 25 | 30 days supply | Qty: 30 | Fill #0

## 2017-02-05 MED FILL — LOSARTAN POTASSIUM 100 MG T: 100 | 30 days supply | Qty: 30 | Fill #0

## 2017-02-05 MED FILL — VENLAFAXINE HCL ER 75 MG CA: 75 | 30 days supply | Qty: 30 | Fill #0

## 2017-02-05 MED FILL — OMEPRAZOLE DR 20 MG CAPSULE: 20 | 30 days supply | Qty: 30 | Fill #0

## 2017-02-05 NOTE — Progress Notes (Signed)
Patient is here for FU HTN and Depression  Patient denies pain at this time.  Patient has taken medication today. Patient has not eaten today.  Patient request a refill on medications.

## 2017-02-05 NOTE — Patient Instructions (Addendum)
Check BP outside of office 3-4X/week and record and bring to next visit.

## 2017-02-05 NOTE — Progress Notes (Signed)
[name], is a 41 y.o. female  GMW:[id number]  [id number]  DOB - 1976-04-08  Subjective:  Chief Complaint and HPI: [name] is a 41 y.o. female here today to establish care and f/up from htn and She also went to the ED for abdominal pain which has since resolved.  Long-time htn, she is out of work and has no insurance.  Needs meds filled. She is not out of meds and she did take her BP meds today. No HA/dizziness blurry vison/CP/SOB.    Also has a long history of anxiety and depression-previously managed by Dr Reginia Forts at [facility].  She has been stable on Effexor for a while but she wants to consider increasing the dose or adding a different medication.  No SI/HI.  Has a supportive GF and home-life.  She is out of work, so, she does have financial stressors.  She does feel hopeful that she will be starting school in May.  According to Epic chart review there is some mention of h/o substance abuse and drug seeking behavior.  ED/Hospital notes reviewed.   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  Depression screen [facility] 2/9 02/05/2017 09/04/2015 07/09/2015  Decreased Interest 1 0 0  Down, Depressed, Hopeless 1 1 0  PHQ - 2 Score 2 1 0  Altered sleeping 3 - -  Tired, decreased energy 1 - -  Change in appetite 1 - -  Feeling bad or failure about yourself  1 - -  Trouble concentrating 1 - -  Moving slowly or fidgety/restless 1 - -  Suicidal thoughts 0 - -  PHQ-9 Score 10 - -   GAD 7 : Generalized Anxiety Score 02/05/2017  Nervous, Anxious, on Edge 3  Control/stop worrying 1  Worry too much - different things 1  Trouble relaxing 3  Restless 1  Easily annoyed or irritable 2  Afraid - awful might happen 1  Total GAD 7 Score 12      ROS:   Constitutional:  No f/c, No night sweats, No unexplained weight loss. EENT:  No vision changes, No blurry vision, No hearing changes. No mouth, throat, or ear problems.    Respiratory: No cough, No SOB Cardiac: No CP, no palpitations GI:  No abd pain, No N/V/D. GU: No Urinary s/sx Musculoskeletal: No joint pain Neuro: No headache, no dizziness, no motor weakness.  Skin: No rash Endocrine:  No polydipsia. No polyuria.  Psych: Denies SI/HI  No problems updated.  ALLERGIES: Allergies  Allergen Reactions  . Vicodin [Hydrocodone-Acetaminophen] Nausea And Vomiting    PAST MEDICAL HISTORY: Past Medical History:  Diagnosis Date  . Abnormal Pap smear 1998   had colpo  . Anxiety   . Asthma    ,as child only   no inhaler  . Bicornate uterus   . Bicornuate uterus   . Chlamydia 2000  . GERD (gastroesophageal reflux disease)   . HCV infection    HX of Hep C  . Headache(784.0)   . Hypertension 2010  . IUP (intrauterine pregnancy), incidental 13 weeks  . Opiate dependence ([place])    stopped methadone 01/2011  . PONV (postoperative nausea and vomiting)   . STD (female)    HX of STD's    MEDICATIONS AT HOME: Prior to Admission medications   Medication Sig Start Date End Date Taking? Authorizing Provider  hydrochlorothiazide (HYDRODIURIL) 25 MG tablet Take 1 tablet (25 mg total) by mouth daily. 02/05/17 03/07/17 Yes Levada Dy  M Nickalaus Crooke, PA-C  losartan (COZAAR) 100 MG tablet Take 1 tablet (100 mg total) by mouth daily. 02/05/17 03/07/17 Yes Dionne Bucy Kenon [place]ashmit, PA-C  omeprazole (PRILOSEC) 20 MG capsule Take 1 capsule (20 mg total) by mouth daily. 02/05/17  Yes Argentina Donovan, PA-C  venlafaxine XR (EFFEXOR XR) 75 MG 24 hr capsule Take 1 capsule (75 mg total) by mouth daily with breakfast. 02/05/17 03/07/17 Yes Dionne Bucy Aviel Davalos, PA-C  amLODipine (NORVASC) 5 MG tablet Take 1 tablet (5 mg total) by mouth daily. 02/05/17   Argentina Donovan, PA-C     Objective:  EXAM:   Vitals:   02/05/17 0843  BP: (!) 135/100  Pulse: 90  Resp: 18  Temp: 98.5 F (36.9 C)  TempSrc: Oral  SpO2: 98%  Weight: 172 lb 12.8 oz (78.4 kg)  Height: 5\' 2"  (1.575 m)    General  appearance : A&OX3. NAD. Non-toxic-appearing, tearful at times but pleasant HEENT: Atraumatic and Normocephalic.  PERRLA. EOM intact.  TM clear B. Neck: supple, no JVD. No cervical lymphadenopathy. No thyromegaly Chest/Lungs:  Breathing-non-labored, Good air entry bilaterally, breath sounds normal without rales, rhonchi, or wheezing  CVS: S1 S2 regular, no murmurs, gallops, rubs Extremities: Bilateral Lower Ext shows no edema, both legs are warm to touch with = pulse throughout Neurology:  CN II-XII grossly intact, Non focal.   Psych:  TP linear. J/I WNL. Normal speech. Appropriate eye contact and affect.  Skin:  No Rash  Data Review Lab Results  Component Value Date   HGBA1C 5.7 (H) 09/19/2014     Assessment & Plan   1. Hypertension, unspecified type Uncontrolled-will increase dose of amlodipine from 2.5mg  to 5mg  and keep Losartan and HCTZ as-is. - amLODipine (NORVASC) 5 MG tablet; Take 1 tablet (5 mg total) by mouth daily.  Dispense: 90 tablet; Refill: 3 - hydrochlorothiazide (HYDRODIURIL) 25 MG tablet; Take 1 tablet (25 mg total) by mouth daily.  Dispense: 90 tablet; Refill: 1 - losartan (COZAAR) 100 MG tablet; Take 1 tablet (100 mg total) by mouth daily.  Dispense: 90 tablet; Refill: 1 Check BP outside of office 3-4X/week and record and bring to next visit.  2. Gastroesophageal reflux disease, esophagitis presence not specified - omeprazole (PRILOSEC) 20 MG capsule; Take 1 capsule (20 mg total) by mouth daily.  Dispense: 30 capsule; Refill: 5  3. Anxiety Offered for her to meet with the social worker but she declined.  Consider psychiatry/psychology referral once she gets medicaid or orange card; for now she wishes to defer this Continue- venlafaxine XR (EFFEXOR XR) 75 MG 24 hr capsule; Take 1 capsule (75 mg total) by mouth daily with breakfast.  Dispense: 90 capsule; Refill: 1   Patient have been counseled extensively about nutrition and exercise; slef-care and healthy coping  mechanisms. Avoid alcohol/substances  Return in about 3 weeks (around 02/26/2017) for assign PCP; check BP/labs.  The patient was given clear instructions to go to ER or return to medical center if symptoms don't improve, worsen or new problems develop. The patient verbalized understanding. The patient was told to call to get lab results if they haven't heard anything in the next week.     Freeman Caldron, PA-C [facility] and [place], [place]   02/05/2017, 9:02 AMPatient ID: Song Myre, female   DOB: 1976/10/26, 41 y.o.   MRN: [id number]

## 2017-02-08 ENCOUNTER — Ambulatory Visit: Payer: Self-pay

## 2017-03-08 ENCOUNTER — Encounter: Payer: Self-pay | Admitting: Family Medicine

## 2017-03-08 ENCOUNTER — Ambulatory Visit: Payer: Self-pay | Attending: Family Medicine | Admitting: Internal Medicine

## 2017-03-08 VITALS — BP 123/82 | HR 79 | Temp 98.4°F | Resp 18 | Ht 62.0 in | Wt 171.2 lb

## 2017-03-08 DIAGNOSIS — Z79899 Other long term (current) drug therapy: Secondary | ICD-10-CM | POA: Insufficient documentation

## 2017-03-08 DIAGNOSIS — Z760 Encounter for issue of repeat prescription: Secondary | ICD-10-CM | POA: Insufficient documentation

## 2017-03-08 DIAGNOSIS — R51 Headache: Secondary | ICD-10-CM | POA: Insufficient documentation

## 2017-03-08 DIAGNOSIS — F419 Anxiety disorder, unspecified: Secondary | ICD-10-CM

## 2017-03-08 DIAGNOSIS — D259 Leiomyoma of uterus, unspecified: Secondary | ICD-10-CM | POA: Insufficient documentation

## 2017-03-08 DIAGNOSIS — F411 Generalized anxiety disorder: Secondary | ICD-10-CM | POA: Insufficient documentation

## 2017-03-08 DIAGNOSIS — I10 Essential (primary) hypertension: Secondary | ICD-10-CM | POA: Insufficient documentation

## 2017-03-08 DIAGNOSIS — Z7689 Persons encountering health services in other specified circumstances: Secondary | ICD-10-CM | POA: Insufficient documentation

## 2017-03-08 DIAGNOSIS — N920 Excessive and frequent menstruation with regular cycle: Secondary | ICD-10-CM | POA: Insufficient documentation

## 2017-03-08 DIAGNOSIS — K219 Gastro-esophageal reflux disease without esophagitis: Secondary | ICD-10-CM | POA: Insufficient documentation

## 2017-03-08 MED ORDER — OMEPRAZOLE 40 MG PO CPDR: 40.0000 mg | DELAYED_RELEASE_CAPSULE | Freq: Every day | ORAL | 3 refills | Status: DC

## 2017-03-08 MED ORDER — VENLAFAXINE HCL ER 75 MG PO CP24: 75.0000 mg | ORAL_CAPSULE | Freq: Every day | ORAL | 3 refills | Status: DC

## 2017-03-08 MED ORDER — AMLODIPINE BESYLATE 5 MG PO TABS: 5.0000 mg | ORAL_TABLET | Freq: Every day | ORAL | 3 refills | Status: DC

## 2017-03-08 MED ORDER — LOSARTAN POTASSIUM 100 MG PO TABS: 100.0000 mg | ORAL_TABLET | Freq: Every day | ORAL | 3 refills | Status: DC

## 2017-03-08 MED ORDER — HYDROCHLOROTHIAZIDE 25 MG PO TABS: 25.0000 mg | ORAL_TABLET | Freq: Every day | ORAL | 3 refills | Status: DC

## 2017-03-08 MED FILL — AMLODIPINE BESYLATE 5 MG TA: 5 | 30 days supply | Qty: 30 | Fill #0

## 2017-03-08 MED FILL — HYDROCHLOROTHIAZIDE 25 MG T: 25 | 30 days supply | Qty: 30 | Fill #0

## 2017-03-08 MED FILL — VENLAFAXINE HCL ER 75 MG CA: 75 | 30 days supply | Qty: 30 | Fill #0

## 2017-03-08 MED FILL — LOSARTAN POTASSIUM 100 MG T: 100 | 30 days supply | Qty: 30 | Fill #0

## 2017-03-08 MED FILL — OMEPRAZOLE DR 40 MG CAPSULE: 40 | 30 days supply | Qty: 30 | Fill #0

## 2017-03-08 NOTE — Progress Notes (Signed)
[name], is a 41 y.o. female  WGN:[id number]  HQI:[id number]  DOB - [birth year]  Chief Complaint  Patient presents with  . Follow-up      Subjective:   [name] is a 41 y.o. female with history of hypertension, GERD, anxiety and depression, and chronic headaches here today to establish care and medication refills. Patient has no new complaints, she said since starting back on her medications and being adherent, she has been feeling a lot better. She particularly feels better with her moods and her level of anxiety has gone down a lot since taking her Effexor regularly. She wants to apply for financial assistance today and would like all her meds refilled. She claims adherence, no side effect reported. BP is now controlled. She denies any suicidal ideations or thoughts. She has history of Bicornuate Uterus and fibroids with consequent excessive menstrual bleeding although she does not bleed every month, could be once in 2 - 3 months but she bleeds a lot during her menstrual period. She has a gynecologist for that, she does not tolerate iron supplement because of constipation. She denies current use of illicit drugs. Patient has No headache, No chest pain, No abdominal pain - No Nausea, No new weakness tingling or numbness, No Cough - SOB.  Problem  Gastroesophageal Reflux Disease  Essential Hypertension   ALLERGIES: Allergies  Allergen Reactions  . Vicodin [Hydrocodone-Acetaminophen] Nausea And Vomiting    PAST MEDICAL HISTORY: Past Medical History:  Diagnosis Date  . Abnormal Pap smear 1998   had colpo  . Anxiety   . Asthma    ,as child only   no inhaler  . Bicornate uterus   . Bicornuate uterus   . Chlamydia 2000  . GERD (gastroesophageal reflux disease)   . HCV infection    HX of Hep C  . Headache(784.0)   . Hypertension 2010  . IUP (intrauterine pregnancy), incidental 13 weeks  . Opiate dependence ([place])    stopped methadone 01/2011  . PONV (postoperative  nausea and vomiting)   . STD (female)    HX of STD's    MEDICATIONS AT HOME: Prior to Admission medications   Medication Sig Start Date End Date Taking? Authorizing Provider  amLODipine (NORVASC) 5 MG tablet Take 1 tablet (5 mg total) by mouth daily. 03/08/17   Tresa Garter, MD  hydrochlorothiazide (HYDRODIURIL) 25 MG tablet Take 1 tablet (25 mg total) by mouth daily. 03/08/17   Tresa Garter, MD  losartan (COZAAR) 100 MG tablet Take 1 tablet (100 mg total) by mouth daily. 03/08/17   Tresa Garter, MD  omeprazole (PRILOSEC) 40 MG capsule Take 1 capsule (40 mg total) by mouth daily. 03/08/17   Tresa Garter, MD  venlafaxine XR (EFFEXOR XR) 75 MG 24 hr capsule Take 1 capsule (75 mg total) by mouth daily with breakfast. 03/08/17   Tresa Garter, MD    Objective:   Vitals:   03/08/17 0842  BP: 123/82  Pulse: 79  Resp: 18  Temp: 98.4 F (36.9 C)  TempSrc: Oral  SpO2: 97%  Weight: 171 lb 3.2 oz (77.7 kg)  Height: _0  (1.575 m)   Exam General appearance : Awake, alert, not in any distress. Speech Clear. Not toxic looking HEENT: Atraumatic and Normocephalic, pupils equally reactive to light and accomodation Neck: Supple, no JVD. No cervical lymphadenopathy.  Chest: Good air entry bilaterally, no added sounds  CVS: S1 S2 regular, no murmurs.  Abdomen: Bowel sounds present, Non  tender and not distended with no gaurding, rigidity or rebound. Extremities: B/L Lower Ext shows no edema, both legs are warm to touch Neurology: Awake alert, and oriented X 3, CN II-XII intact, Non focal Skin: No Rash  Data Review Lab Results  Component Value Date   HGBA1C 5.7 (H) 09/19/2014    Assessment & Plan   1. Essential hypertension  - CMP14+EGFR - TSH Refill - losartan (COZAAR) 100 MG tablet; Take 1 tablet (100 mg total) by mouth daily.  Dispense: 90 tablet; Refill: 3 - hydrochlorothiazide (HYDRODIURIL) 25 MG tablet; Take 1 tablet (25 mg total) by mouth daily.   Dispense: 90 tablet; Refill: 3 - amLODipine (NORVASC) 5 MG tablet; Take 1 tablet (5 mg total) by mouth daily.  Dispense: 90 tablet; Refill: 3  2. Gastroesophageal reflux disease, esophagitis presence not specified Increase Prilosec to 40 mg daily due to worsening symptom on 20 mg - omeprazole (PRILOSEC) 40 MG capsule; Take 1 capsule (40 mg total) by mouth daily.  Dispense: 90 capsule; Refill: 3  3. Anxiety Refill - venlafaxine XR (EFFEXOR XR) 75 MG 24 hr capsule; Take 1 capsule (75 mg total) by mouth daily with breakfast.  Dispense: 90 capsule; Refill: 3   Patient have been counseled extensively about nutrition and exercise. Other issues discussed during this visit include: low cholesterol diet, weight control and daily exercise, importance of adherence with medications and regular follow-up. We also discussed long term complications of uncontrolled hypertension.   Return in about 3 months (around 06/07/2017) for Follow up HTN, Generalized Anxiety Disorder.  The patient was given clear instructions to go to ER or return to medical center if symptoms don't improve, worsen or new problems develop. The patient verbalized understanding. The patient was told to call to get lab results if they haven't heard anything in the next week.   This note has been created with Surveyor, quantity. Any transcriptional errors are unintentional.    Angelica Chessman, MD, [place], Karilyn Cota, [place] [facility] [place] [facility] [place], [place]   03/08/2017, 9:03 AM

## 2017-03-08 NOTE — Progress Notes (Signed)
Patient is here to establish care.  Patient has taken medication today. Patient has not eaten today.  Patient denies pain at this time.

## 2017-03-08 NOTE — Patient Instructions (Signed)
Heartburn Heartburn is a type of pain or discomfort that can happen in the throat or chest. It is often described as a burning pain. It may also cause a bad taste in the mouth. Heartburn may feel worse when you lie down or bend over. It may be caused by stomach contents that move back up (reflux) into the tube that connects the mouth with the stomach (esophagus). Follow these instructions at home: Take these actions to lessen your discomfort and to help avoid problems. Diet   Follow a diet as told by your doctor. You may need to avoid foods and drinks such as:  Coffee and tea (with or without caffeine).  Drinks that contain alcohol.  Energy drinks and sports drinks.  Carbonated drinks or sodas.  Chocolate and cocoa.  Peppermint and mint flavorings.  Garlic and onions.  Horseradish.  Spicy and acidic foods, such as peppers, chili powder, curry powder, vinegar, hot sauces, and BBQ sauce.  Citrus fruit juices and citrus fruits, such as oranges, lemons, and limes.  Tomato-based foods, such as red sauce, chili, salsa, and pizza with red sauce.  Fried and fatty foods, such as donuts, french fries, potato chips, and high-fat dressings.  High-fat meats, such as hot dogs, rib eye steak, sausage, ham, and bacon.  High-fat dairy items, such as whole milk, butter, and cream cheese.  Eat small meals often. Avoid eating large meals.  Avoid drinking large amounts of liquid with your meals.  Avoid eating meals during the 2-3 hours before bedtime.  Avoid lying down right after you eat.  Do not exercise right after you eat. General instructions   Pay attention to any changes in your symptoms.  Take over-the-counter and prescription medicines only as told by your  doctor. Do not take aspirin, ibuprofen, or other NSAIDs unless your doctor says it is okay.  Do not use any tobacco products, including cigarettes, chewing tobacco, and e-cigarettes. If you need help quitting, ask your doctor.  Wear loose clothes. Do not wear anything tight around your waist.  Raise (elevate) the head of your bed about 6 inches (15 cm).  Try to lower your stress. If you need help doing this, ask your doctor.  If you are overweight, lose an amount of weight that is healthy for you. Ask your doctor about a safe weight loss goal.  Keep all follow-up visits as told by your doctor. This is important. Contact a doctor if:  You have new symptoms.  You lose weight and you do not know why it is happening.  You have trouble swallowing, or it hurts to swallow.  You have wheezing or a cough that keeps happening.  Your symptoms do not get better with treatment.  You have heartburn often for more than two weeks. Get help right away if:  You have pain in your arms, neck, jaw, teeth, or back.  You feel sweaty, dizzy, or light-headed.  You have chest pain or shortness of breath.  You throw up (vomit) and your throw up looks like blood or coffee grounds.  Your poop (stool) is bloody or black. This information is not intended to replace advice given to you by your health care provider. Make sure you discuss any questions you have with your health care provider. Document Released: 07/22/2011 Document Revised: 04/16/2016 Document Reviewed: 03/06/2015 Elsevier Interactive Patient Education  2017 [place]. Hypertension Hypertension, commonly called high blood pressure, is when the force of blood pumping through the arteries is too strong. The arteries  are the blood vessels that carry blood from the heart throughout the body. Hypertension forces the heart to work harder to pump blood and may cause arteries to become narrow or stiff. Having untreated or uncontrolled hypertension  can cause heart attacks, strokes, kidney disease, and other problems. A blood pressure reading consists of a higher number over a lower number. Ideally, your blood pressure should be below 120/80. The first ("top") number is called the systolic pressure. It is a measure of the pressure in your arteries as your heart beats. The second ("bottom") number is called the diastolic pressure. It is a measure of the pressure in your arteries as the heart relaxes. What are the causes? The cause of this condition is not known. What increases the risk? Some risk factors for high blood pressure are under your control. Others are not. Factors you can change   Smoking.  Having type 2 diabetes mellitus, high cholesterol, or both.  Not getting enough exercise or physical activity.  Being overweight.  Having too much fat, sugar, calories, or salt (sodium) in your diet.  Drinking too much alcohol. Factors that are difficult or impossible to change   Having chronic kidney disease.  Having a family history of high blood pressure.  Age. Risk increases with age.  Race. You may be at higher risk if you are African-American.  Gender. Men are at higher risk than women before age 23. After age 76, women are at higher risk than men.  Having obstructive sleep apnea.  Stress. What are the signs or symptoms? Extremely high blood pressure (hypertensive crisis) may cause:  Headache.  Anxiety.  Shortness of breath.  Nosebleed.  Nausea and vomiting.  Severe chest pain.  Jerky movements you cannot control (seizures). How is this diagnosed? This condition is diagnosed by measuring your blood pressure while you are seated, with your arm resting on a surface. The cuff of the blood pressure monitor will be placed directly against the skin of your upper arm at the level of your heart. It should be measured at least twice using the same arm. Certain conditions can cause a difference in blood pressure  between your right and left arms. Certain factors can cause blood pressure readings to be lower or higher than normal (elevated) for a short period of time:  When your blood pressure is higher when you are in a health care provider's office than when you are at home, this is called white coat hypertension. Most people with this condition do not need medicines.  When your blood pressure is higher at home than when you are in a health care provider's office, this is called masked hypertension. Most people with this condition may need medicines to control blood pressure. If you have a high blood pressure reading during one visit or you have normal blood pressure with other risk factors:  You may be asked to return on a different day to have your blood pressure checked again.  You may be asked to monitor your blood pressure at home for 1 week or longer. If you are diagnosed with hypertension, you may have other blood or imaging tests to help your health care provider understand your overall risk for other conditions. How is this treated? This condition is treated by making healthy lifestyle changes, such as eating healthy foods, exercising more, and reducing your alcohol intake. Your health care provider may prescribe medicine if lifestyle changes are not enough to get your blood pressure under control, and if:  Your systolic blood pressure is above 130.  Your diastolic blood pressure is above 80. Your personal target blood pressure may vary depending on your medical conditions, your age, and other factors. Follow these instructions at home: Eating and drinking   Eat a diet that is high in fiber and potassium, and low in sodium, added sugar, and fat. An example eating plan is called the DASH (Dietary Approaches to Stop Hypertension) diet. To eat this way:  Eat plenty of fresh fruits and vegetables. Try to fill half of your plate at each meal with fruits and vegetables.  Eat whole grains, such  as whole wheat pasta, brown rice, or whole grain bread. Fill about one quarter of your plate with whole grains.  Eat or drink low-fat dairy products, such as skim milk or low-fat yogurt.  Avoid fatty cuts of meat, processed or cured meats, and poultry with skin. Fill about one quarter of your plate with lean proteins, such as fish, chicken without skin, beans, eggs, and tofu.  Avoid premade and processed foods. These tend to be higher in sodium, added sugar, and fat.  Reduce your daily sodium intake. Most people with hypertension should eat less than 1,500 mg of sodium a day.  Limit alcohol intake to no more than 1 drink a day for nonpregnant women and 2 drinks a day for men. One drink equals 12 oz of beer, 5 oz of wine, or 1 oz of hard liquor. Lifestyle   Work with your health care provider to maintain a healthy body weight or to lose weight. Ask what an ideal weight is for you.  Get at least 30 minutes of exercise that causes your heart to beat faster (aerobic exercise) most days of the week. Activities may include walking, swimming, or biking.  Include exercise to strengthen your muscles (resistance exercise), such as pilates or lifting weights, as part of your weekly exercise routine. Try to do these types of exercises for 30 minutes at least 3 days a week.  Do not use any products that contain nicotine or tobacco, such as cigarettes and e-cigarettes. If you need help quitting, ask your health care provider.  Monitor your blood pressure at home as told by your health care provider.  Keep all follow-up visits as told by your health care provider. This is important. Medicines   Take over-the-counter and prescription medicines only as told by your health care provider. Follow directions carefully. Blood pressure medicines must be taken as prescribed.  Do not skip doses of blood pressure medicine. Doing this puts you at risk for problems and can make the medicine less effective.  Ask  your health care provider about side effects or reactions to medicines that you should watch for. Contact a health care provider if:  You think you are having a reaction to a medicine you are taking.  You have headaches that keep coming back (recurring).  You feel dizzy.  You have swelling in your ankles.  You have trouble with your vision. Get help right away if:  You develop a severe headache or confusion.  You have unusual weakness or numbness.  You feel faint.  You have severe pain in your chest or abdomen.  You vomit repeatedly.  You have trouble breathing. Summary  Hypertension is when the force of blood pumping through your arteries is too strong. If this condition is not controlled, it may put you at risk for serious complications.  Your personal target blood pressure may vary depending on  your medical conditions, your age, and other factors. For most people, a normal blood pressure is less than 120/80.  Hypertension is treated with lifestyle changes, medicines, or a combination of both. Lifestyle changes include weight loss, eating a healthy, low-sodium diet, exercising more, and limiting alcohol. This information is not intended to replace advice given to you by your health care provider. Make sure you discuss any questions you have with your health care provider. Document Released: 11/09/2005 Document Revised: 10/07/2016 Document Reviewed: 10/07/2016 Elsevier Interactive Patient Education  2017 [employer].

## 2017-04-12 MED FILL — VENLAFAXINE HCL ER 75 MG CA: 75 | 30 days supply | Qty: 30 | Fill #1

## 2017-04-12 MED FILL — LOSARTAN POTASSIUM 100 MG T: 100 | 30 days supply | Qty: 30 | Fill #1

## 2017-04-12 MED FILL — OMEPRAZOLE DR 40 MG CAPSULE: 40 | 30 days supply | Qty: 30 | Fill #1

## 2017-04-12 MED FILL — HYDROCHLOROTHIAZIDE 25 MG T: 25 | 30 days supply | Qty: 30 | Fill #1

## 2017-04-12 MED FILL — AMLODIPINE BESYLATE 5 MG TA: 5 | 30 days supply | Qty: 30 | Fill #1

## 2017-05-11 MED FILL — VENLAFAXINE HCL ER 75 MG CA: 75 | 30 days supply | Qty: 30 | Fill #2

## 2017-05-11 MED FILL — OMEPRAZOLE DR 40 MG CAPSULE: 40 | 30 days supply | Qty: 30 | Fill #2

## 2017-05-11 MED FILL — HYDROCHLOROTHIAZIDE 25 MG T: 25 | 30 days supply | Qty: 30 | Fill #2

## 2017-05-11 MED FILL — ?AMLODIPINE BESYLATE 5 MG T: 5 | 30 days supply | Qty: 30 | Fill #2

## 2017-05-11 MED FILL — LOSARTAN POTASSIUM 100 MG T: 100 | 30 days supply | Qty: 30 | Fill #2

## 2017-06-08 MED FILL — HYDROCHLOROTHIAZIDE 25 MG T: 25 | 30 days supply | Qty: 30 | Fill #3

## 2017-06-08 MED FILL — LOSARTAN POTASSIUM 100 MG T: 100 | 30 days supply | Qty: 30 | Fill #3

## 2017-06-08 MED FILL — OMEPRAZOLE 40 MG CPDR: 40 | 30 days supply | Qty: 30 | Fill #3

## 2017-06-08 MED FILL — AMLODIPINE BESYLATE 5 MG TA: 5 | 30 days supply | Qty: 30 | Fill #3

## 2017-06-08 MED FILL — VENLAFAXINE HCL ER 75 MG CA: 75 | 30 days supply | Qty: 30 | Fill #3

## 2017-06-18 ENCOUNTER — Ambulatory Visit: Payer: Self-pay | Admitting: Family Medicine

## 2017-07-13 MED FILL — OMEPRAZOLE DR 40 MG CAPSULE: 40 | 30 days supply | Qty: 30 | Fill #4

## 2017-07-13 MED FILL — LOSARTAN POTASSIUM 100 MG T: 100 | 30 days supply | Qty: 30 | Fill #4

## 2017-07-13 MED FILL — HYDROCHLOROTHIAZIDE 25 MG T: 25 | 30 days supply | Qty: 30 | Fill #4

## 2017-07-13 MED FILL — VENLAFAXINE HCL ER 75 MG CA: 75 | 30 days supply | Qty: 30 | Fill #4

## 2017-07-13 MED FILL — AMLODIPINE BESYLATE 5 MG TA: 5 | 30 days supply | Qty: 30 | Fill #4

## 2017-08-06 ENCOUNTER — Ambulatory Visit: Payer: Self-pay | Admitting: Family Medicine

## 2017-08-19 MED FILL — MIRTAZAPINE 15 MG TABLET: 15 | 30 days supply | Qty: 30 | Fill #0

## 2017-08-19 MED FILL — busPIRone HCL 15 MG TABS: 15 | 30 days supply | Qty: 90 | Fill #0

## 2017-08-19 MED FILL — GABAPENTIN 300 MG CAPSULE: 300 | 30 days supply | Qty: 90 | Fill #0

## 2017-08-24 MED FILL — OMEPRAZOLE DR 40 MG CAPSULE: 40 | 30 days supply | Qty: 30 | Fill #5

## 2017-08-24 MED FILL — VENLAFAXINE HCL ER 150 MG C: 150 | 30 days supply | Qty: 30 | Fill #0

## 2017-08-25 ENCOUNTER — Ambulatory Visit: Payer: Self-pay | Admitting: Internal Medicine

## 2017-09-01 ENCOUNTER — Ambulatory Visit: Payer: Self-pay | Admitting: Family Medicine

## 2017-09-23 ENCOUNTER — Telehealth: Payer: Self-pay | Admitting: Family Medicine

## 2017-09-23 DIAGNOSIS — F419 Anxiety disorder, unspecified: Secondary | ICD-10-CM

## 2017-09-23 DIAGNOSIS — K219 Gastro-esophageal reflux disease without esophagitis: Secondary | ICD-10-CM

## 2017-09-23 DIAGNOSIS — I10 Essential (primary) hypertension: Secondary | ICD-10-CM

## 2017-09-23 MED ORDER — AMLODIPINE BESYLATE 5 MG PO TABS: 5.0000 mg | ORAL_TABLET | Freq: Every day | ORAL | 0 refills | Status: DC

## 2017-09-23 MED ORDER — LOSARTAN POTASSIUM 100 MG PO TABS
100.0000 mg | ORAL_TABLET | Freq: Every day | ORAL | 0 refills | Status: DC
Start: 2017-09-23 — End: 2017-10-27

## 2017-09-23 MED ORDER — HYDROCHLOROTHIAZIDE 25 MG PO TABS: 25.0000 mg | ORAL_TABLET | Freq: Every day | ORAL | 0 refills | Status: DC

## 2017-09-23 MED ORDER — OMEPRAZOLE 40 MG PO CPDR: 40.0000 mg | DELAYED_RELEASE_CAPSULE | Freq: Every day | ORAL | 0 refills | Status: DC

## 2017-09-23 MED ORDER — VENLAFAXINE HCL ER 75 MG PO CP24
75.0000 mg | ORAL_CAPSULE | Freq: Every day | ORAL | 0 refills | Status: DC
Start: 2017-09-23 — End: 2017-10-27

## 2017-09-23 MED FILL — LOSARTAN POTASSIUM 100 MG T: 100 | 30 days supply | Qty: 30 | Fill #5

## 2017-09-23 MED FILL — OMEPRAZOLE DR 40 MG CAPSULE: 40 | 30 days supply | Qty: 30 | Fill #6

## 2017-09-23 MED FILL — HYDROCHLOROTHIAZIDE 25 MG T: 25 | 30 days supply | Qty: 30 | Fill #5

## 2017-09-23 MED FILL — AMLODIPINE BESYLATE 5 MG TA: 5 | 30 days supply | Qty: 30 | Fill #5

## 2017-09-23 NOTE — Telephone Encounter (Signed)
Patient came in to request refill on  All medications before her next appointment [facility]

## 2017-09-23 NOTE — Telephone Encounter (Signed)
Refilled x 30 days. 

## 2017-10-08 MED FILL — VENLAFAXINE HCL ER 75 MG CA: 75 | 30 days supply | Qty: 30 | Fill #0

## 2017-10-27 ENCOUNTER — Ambulatory Visit: Payer: Self-pay | Attending: Family Medicine | Admitting: Family Medicine

## 2017-10-27 ENCOUNTER — Encounter: Payer: Self-pay | Admitting: Family Medicine

## 2017-10-27 ENCOUNTER — Ambulatory Visit: Payer: Self-pay | Admitting: Family Medicine

## 2017-10-27 VITALS — BP 136/93 | HR 77 | Temp 97.7°F | Ht 62.0 in | Wt 166.4 lb

## 2017-10-27 DIAGNOSIS — B1920 Unspecified viral hepatitis C without hepatic coma: Secondary | ICD-10-CM | POA: Insufficient documentation

## 2017-10-27 DIAGNOSIS — I10 Essential (primary) hypertension: Secondary | ICD-10-CM | POA: Insufficient documentation

## 2017-10-27 DIAGNOSIS — F1120 Opioid dependence, uncomplicated: Secondary | ICD-10-CM | POA: Insufficient documentation

## 2017-10-27 DIAGNOSIS — F419 Anxiety disorder, unspecified: Secondary | ICD-10-CM | POA: Insufficient documentation

## 2017-10-27 DIAGNOSIS — K009 Disorder of tooth development, unspecified: Secondary | ICD-10-CM

## 2017-10-27 DIAGNOSIS — K219 Gastro-esophageal reflux disease without esophagitis: Secondary | ICD-10-CM | POA: Insufficient documentation

## 2017-10-27 DIAGNOSIS — J45909 Unspecified asthma, uncomplicated: Secondary | ICD-10-CM | POA: Insufficient documentation

## 2017-10-27 DIAGNOSIS — Z79899 Other long term (current) drug therapy: Secondary | ICD-10-CM | POA: Insufficient documentation

## 2017-10-27 DIAGNOSIS — Z885 Allergy status to narcotic agent status: Secondary | ICD-10-CM | POA: Insufficient documentation

## 2017-10-27 DIAGNOSIS — F329 Major depressive disorder, single episode, unspecified: Secondary | ICD-10-CM | POA: Insufficient documentation

## 2017-10-27 DIAGNOSIS — Z9889 Other specified postprocedural states: Secondary | ICD-10-CM | POA: Insufficient documentation

## 2017-10-27 MED ORDER — AMLODIPINE BESYLATE 5 MG PO TABS: 5.0000 mg | ORAL_TABLET | Freq: Every day | ORAL | 5 refills | Status: DC

## 2017-10-27 MED ORDER — LOSARTAN POTASSIUM-HCTZ 100-25 MG PO TABS: 1.0000 | ORAL_TABLET | Freq: Every day | ORAL | 5 refills | Status: DC

## 2017-10-27 MED ORDER — VENLAFAXINE HCL ER 150 MG PO CP24: 150.0000 mg | ORAL_CAPSULE | Freq: Every day | ORAL | 5 refills | Status: DC

## 2017-10-27 MED ORDER — OMEPRAZOLE 40 MG PO CPDR: 40.0000 mg | DELAYED_RELEASE_CAPSULE | Freq: Every day | ORAL | 5 refills | Status: DC

## 2017-10-27 MED FILL — OMEPRAZOLE DR 40 MG CAPSULE: 40 | 30 days supply | Qty: 30 | Fill #0

## 2017-10-27 MED FILL — LOSARTAN-HCTZ 100-25 MG TAB: 100-25 | 30 days supply | Qty: 30 | Fill #0

## 2017-10-27 MED FILL — VENLAFAXINE HCL ER 150 MG C: 150 | 30 days supply | Qty: 30 | Fill #0

## 2017-10-27 MED FILL — AMLODIPINE BESYLATE 5 MG TA: 5 | 30 days supply | Qty: 30 | Fill #0

## 2017-10-27 NOTE — Progress Notes (Signed)
Pt would like an increase on EFFEXOR to 150MG .  Pt would like dental referral.

## 2017-10-27 NOTE — Patient Instructions (Signed)

## 2017-10-27 NOTE — Progress Notes (Signed)
Subjective:  Patient ID: [name], female    DOB: 1976-10-11  Age: 41 y.o. MRN: [id number]  CC: Medication Refill   HPI [name] is a 41 year old female with a history of GERD, anxiety and depression who presents today to establish care with me.  She was recently hospitalized at [facility] at which time she received therapy and there was a recommendation to increase her dose of Effexor from 75 mg to 150 mg and she has noticed improvement in her symptoms with increased dose however she is needing refills today. Denies suicidal ideations or intents.  She has run out of her antihypertensives and only has hydrochlorothiazide; she is requesting a refill today. For her reflux she takes omeprazole and would like a refill today.  She complains of pain in her teeth and cracking of her teeth for which she is requesting a dental referral.  She had to use braces to hold it in place. Denies gum swelling, fever.  Past Medical History:  Diagnosis Date  . Abnormal Pap smear 1998   had colpo  . Anxiety   . Asthma    ,as child only   no inhaler  . Bicornate uterus   . Bicornuate uterus   . Chlamydia 2000  . GERD (gastroesophageal reflux disease)   . HCV infection    HX of Hep C  . Headache(784.0)   . Hypertension 2010  . IUP (intrauterine pregnancy), incidental 13 weeks  . Opiate dependence ([place])    stopped methadone 01/2011  . PONV (postoperative nausea and vomiting)   . STD (female)    HX of STD's    Past Surgical History:  Procedure Laterality Date  . BILATERAL SALPINGECTOMY  12/06/2012   Procedure: BILATERAL SALPINGECTOMY;  Surgeon: Mora Bellman, MD;  Location: [facility];  Service: Gynecology;  Laterality: Bilateral;  laparoscopic  . CERVICAL CERCLAGE  09/01/2011   Procedure: CERCLAGE CERVICAL;  Surgeon: Donnamae Jude, MD;  Location: [facility];  Service: Gynecology;  Laterality: N/A;  . CERVICAL CERCLAGE  06/28/2012   Procedure: CERCLAGE CERVICAL;   Surgeon: Donnamae Jude, MD;  Location: [facility];  Service: Gynecology;  Laterality: N/A;  . DILATION AND CURETTAGE OF UTERUS     SAB  . FINGER SURGERY  2003   reattachment of right forefinger  . THERAPEUTIC ABORTION    . THERAPEUTIC ABORTION    . TONSILLECTOMY    . TUBAL LIGATION  11/23/2012    Allergies  Allergen Reactions  . Vicodin [Hydrocodone-Acetaminophen] Nausea And Vomiting     Outpatient Medications Prior to Visit  Medication Sig Dispense Refill  . amLODipine (NORVASC) 5 MG tablet Take 1 tablet (5 mg total) by mouth daily. 30 tablet 0  . hydrochlorothiazide (HYDRODIURIL) 25 MG tablet Take 1 tablet (25 mg total) by mouth daily. 30 tablet 0  . losartan (COZAAR) 100 MG tablet Take 1 tablet (100 mg total) by mouth daily. 30 tablet 0  . omeprazole (PRILOSEC) 40 MG capsule Take 1 capsule (40 mg total) by mouth daily. 30 capsule 0  . venlafaxine XR (EFFEXOR XR) 75 MG 24 hr capsule Take 1 capsule (75 mg total) by mouth daily with breakfast. 30 capsule 0   Facility-Administered Medications Prior to Visit  Medication Dose Route Frequency Provider Last Rate Last Dose  . ondansetron (ZOFRAN-ODT) disintegrating tablet 4 mg  4 mg Oral Once Mesner, Corene Cornea, MD        ROS Review of Systems  Constitutional: Negative for activity  change, appetite change and fatigue.  HENT: Positive for dental problem. Negative for congestion, sinus pressure and sore throat.   Eyes: Negative for visual disturbance.  Respiratory: Negative for cough, chest tightness, shortness of breath and wheezing.   Cardiovascular: Negative for chest pain and palpitations.  Gastrointestinal: Negative for abdominal distention, abdominal pain and constipation.  Endocrine: Negative for polydipsia.  Genitourinary: Negative for dysuria and frequency.  Musculoskeletal: Negative for arthralgias and back pain.  Skin: Negative for rash.  Neurological: Negative for tremors, light-headedness and numbness.  Hematological: Does not  bruise/bleed easily.  Psychiatric/Behavioral: Negative for agitation and behavioral problems.    Objective:  BP (!) 136/93   Pulse 77   Temp 97.7 F (36.5 C) (Oral)   Ht 5' 2"  (1.575 m)   Wt 166 lb 6.4 oz (75.5 kg)   LMP 10/19/2017   SpO2 98%   BMI 30.43 kg/m   BP/Weight 10/27/2017 03/08/2017 0/35/5974  Systolic BP 163 845 364  Diastolic BP 93 82 680  Wt. (Lbs) 166.4 171.2 172.8  BMI 30.43 31.31 31.61      Physical Exam  Constitutional: She is oriented to person, place, and time. She appears well-developed and well-nourished.  HENT:  Loss of right upper premolar  Cardiovascular: Normal rate, normal heart sounds and intact distal pulses.  No murmur heard. Pulmonary/Chest: Effort normal and breath sounds normal. She has no wheezes. She has no rales. She exhibits no tenderness.  Abdominal: Soft. Bowel sounds are normal. She exhibits no distension and no mass. There is no tenderness.  Musculoskeletal: Normal range of motion.  Neurological: She is alert and oriented to person, place, and time.  Psychiatric: She has a normal mood and affect.     Assessment & Plan:   1. Anxiety Stable Increased dose as per request - venlafaxine XR (EFFEXOR-XR) 150 MG 24 hr capsule; Take 1 capsule (150 mg total) by mouth daily with breakfast.  Dispense: 30 capsule; Refill: 5  2. Essential hypertension Slightly elevated due to running out of medications which I have refilled - amLODipine (NORVASC) 5 MG tablet; Take 1 tablet (5 mg total) by mouth daily.  Dispense: 30 tablet; Refill: 5 - CMP14+EGFR; Future - Lipid panel; Future  3. Gastroesophageal reflux disease, esophagitis presence not specified Stable - omeprazole (PRILOSEC) 40 MG capsule; Take 1 capsule (40 mg total) by mouth daily.  Dispense: 30 capsule; Refill: 5 - CBC with Differential/Platelet; Future  4. Dental anomaly She will need a dental referral and has been advised to apply for the [place] financial discount to  facilitate this. She knows to call the clinic back once she is approved.   Meds ordered this encounter  Medications  . venlafaxine XR (EFFEXOR-XR) 150 MG 24 hr capsule    Sig: Take 1 capsule (150 mg total) by mouth daily with breakfast.    Dispense:  30 capsule    Refill:  5  . amLODipine (NORVASC) 5 MG tablet    Sig: Take 1 tablet (5 mg total) by mouth daily.    Dispense:  30 tablet    Refill:  5  . losartan-hydrochlorothiazide (HYZAAR) 100-25 MG tablet    Sig: Take 1 tablet by mouth daily.    Dispense:  30 tablet    Refill:  5    Discontinue losartan, discontinue hydrochlorothiazide  . omeprazole (PRILOSEC) 40 MG capsule    Sig: Take 1 capsule (40 mg total) by mouth daily.    Dispense:  30 capsule    Refill:  5    Follow-up: Return in about 3 months (around 01/25/2018) for Follow up of hypertension and anxiety.   Arnoldo Morale MD

## 2017-10-28 ENCOUNTER — Encounter: Payer: Self-pay | Admitting: Family Medicine

## 2017-10-28 ENCOUNTER — Other Ambulatory Visit: Payer: Self-pay

## 2017-11-02 ENCOUNTER — Other Ambulatory Visit: Payer: Self-pay

## 2017-11-09 ENCOUNTER — Encounter (HOSPITAL_COMMUNITY): Payer: Self-pay | Admitting: Family Medicine

## 2017-11-09 ENCOUNTER — Emergency Department (HOSPITAL_COMMUNITY)
Admission: EM | Admit: 2017-11-09 | Discharge: 2017-11-09 | Disposition: A | Discharge status: Home / Self Care | Payer: Self-pay | Attending: Emergency Medicine | Admitting: Emergency Medicine

## 2017-11-09 DIAGNOSIS — R112 Nausea with vomiting, unspecified: Secondary | ICD-10-CM | POA: Insufficient documentation

## 2017-11-09 DIAGNOSIS — F17210 Nicotine dependence, cigarettes, uncomplicated: Secondary | ICD-10-CM | POA: Insufficient documentation

## 2017-11-09 DIAGNOSIS — R110 Nausea: Secondary | ICD-10-CM

## 2017-11-09 DIAGNOSIS — R1084 Generalized abdominal pain: Secondary | ICD-10-CM | POA: Insufficient documentation

## 2017-11-09 DIAGNOSIS — K529 Noninfective gastroenteritis and colitis, unspecified: Secondary | ICD-10-CM | POA: Insufficient documentation

## 2017-11-09 LAB — URINALYSIS, ROUTINE W REFLEX MICROSCOPIC
Bacteria, UA: NONE SEEN
Bilirubin Urine: NEGATIVE
Glucose, UA: NEGATIVE mg/dL
Hgb urine dipstick: NEGATIVE
Ketones, ur: NEGATIVE mg/dL
Nitrite: NEGATIVE
PH: 6 (ref 5.0–8.0)
Protein, ur: NEGATIVE mg/dL
SPECIFIC GRAVITY, URINE: 1.019 (ref 1.005–1.030)

## 2017-11-09 LAB — COMPREHENSIVE METABOLIC PANEL
ALBUMIN: 4.4 g/dL (ref 3.5–5.0)
ALK PHOS: 63 U/L (ref 38–126)
ALT: 86 U/L — AB (ref 14–54)
AST: 36 U/L (ref 15–41)
Anion gap: 8 (ref 5–15)
BUN: 10 mg/dL (ref 6–20)
CO2: 25 mmol/L (ref 22–32)
CREATININE: 0.6 mg/dL (ref 0.44–1.00)
Calcium: 9.4 mg/dL (ref 8.9–10.3)
Chloride: 101 mmol/L (ref 101–111)
GFR calc non Af Amer: >60 mL/min (ref >60)
GLUCOSE: 103 mg/dL — AB (ref 65–99)
Potassium: 3 mmol/L — ABNORMAL LOW (ref 3.5–5.1)
SODIUM: 134 mmol/L — AB (ref 135–145)
Total Bilirubin: 0.6 mg/dL (ref 0.3–1.2)
Total Protein: 7.8 g/dL (ref 6.5–8.1)

## 2017-11-09 LAB — CBC
HCT: 36 % (ref 36.0–46.0)
Hemoglobin: 11.9 g/dL — ABNORMAL LOW (ref 12.0–15.0)
MCH: 23.9 pg — AB (ref 26.0–34.0)
MCHC: 33.1 g/dL (ref 30.0–36.0)
MCV: 72.4 fL — ABNORMAL LOW (ref 78.0–100.0)
PLATELETS: 281 10*3/uL (ref 150–400)
RBC: 4.97 MIL/uL (ref 3.87–5.11)
RDW: 15.9 % — AB (ref 11.5–15.5)
WBC: 9.2 10*3/uL (ref 4.0–10.5)

## 2017-11-09 LAB — I-STAT BETA HCG BLOOD, ED (MC, WL, AP ONLY)

## 2017-11-09 LAB — LIPASE, BLOOD: Lipase: 44 U/L (ref 11–51)

## 2017-11-09 MED ORDER — DICYCLOMINE HCL 10 MG PO CAPS
10.0000 mg | ORAL_CAPSULE | Freq: Once | ORAL | Status: AC
Start: 2017-11-09 — End: 2017-11-09
  Administered 2017-11-09: 10 mg via ORAL
  Filled 2017-11-09: qty 1

## 2017-11-09 MED ORDER — DICYCLOMINE HCL 20 MG PO TABS: 20.0000 mg | ORAL_TABLET | Freq: Two times a day (BID) | ORAL | 0 refills | Status: DC | PRN

## 2017-11-09 MED ORDER — ONDANSETRON 4 MG PO TBDP: 4.0000 mg | ORAL_TABLET | Freq: Three times a day (TID) | ORAL | 0 refills | Status: DC | PRN

## 2017-11-09 MED ORDER — ONDANSETRON 8 MG PO TBDP
8.0000 mg | ORAL_TABLET | Freq: Once | ORAL | Status: AC
  Administered 2017-11-09: 8 mg via ORAL
  Filled 2017-11-09: qty 1

## 2017-11-09 NOTE — Discharge Instructions (Signed)
We advised use of Tylenol or ibuprofen for abdominal pain.  You may use Bentyl as needed for abdominal cramping/spasms.  Take Zofran as needed for nausea.  Continue to remain well hydrated by drinking plenty of clear liquids and drinks to replace your electrolytes such as Pedialyte or Gatorade.  We also recommend the use of an over-the-counter probiotic for diarrhea.  You may return to the emergency department as needed for new or concerning symptoms.

## 2017-11-09 NOTE — ED Triage Notes (Signed)
Patient is reporting abd pain with nausea, vomiting, diarreha that started last Wednesday after eating a salad. Other associated symptoms are gas, heartburn, bloating, cramping and distention. Pain described as sharp.

## 2017-11-09 NOTE — ED Provider Notes (Signed)
[facility] DEPT Provider Note   CSN: [id number] Arrival date & time: 11/09/17  1858     History   Chief Complaint Chief Complaint  Patient presents with  . Abdominal Pain    HPI [name] is a 41 y.o. female.  41 year old female presents to the emergency department for evaluation of nausea, vomiting, diarrhea.  She states the symptoms began 6 days ago.  She has been passing watery bowel movements with little stool.  This improved slightly with Imodium.  No melena or hematochezia.  She has had nausea and vomiting as well, but has been able to tolerate Pedialyte and other oral fluids.  Symptoms further associated with increased belching and passing of flatus.  She has had sensations of heartburn and bloating at times with abdominal cramping.  This has been mildly improved with Tylenol and ibuprofen.  Symptoms began after eating a salad and patient expresses concern that she may have been exposed to E. coli.  She has not had any fevers, dysuria, hematuria.  No history of abdominal surgeries.      Past Medical History:  Diagnosis Date  . Abnormal Pap smear 1998   had colpo  . Anxiety   . Asthma    ,as child only   no inhaler  . Bicornate uterus   . Bicornuate uterus   . Chlamydia 2000  . GERD (gastroesophageal reflux disease)   . HCV infection    HX of Hep C  . Headache(784.0)   . Hypertension 2010  . IUP (intrauterine pregnancy), incidental 13 weeks  . Opiate dependence ([place])    stopped methadone 01/2011  . PONV (postoperative nausea and vomiting)   . STD (female)    HX of STD's    Patient Active Problem List   Diagnosis Date Noted  . Gastroesophageal reflux disease 03/08/2017  . Anxiety 03/08/2017  . Anxiety and depression 10/10/2015  . History of intravenous drug use in remission 09/07/2011  . HCV infection   . Essential hypertension   . Bipolar disorder ([place])   . Bicornuate uterus     Past Surgical History:  Procedure  Laterality Date  . BILATERAL SALPINGECTOMY  12/06/2012   Procedure: BILATERAL SALPINGECTOMY;  Surgeon: Mora Bellman, MD;  Location: [facility];  Service: Gynecology;  Laterality: Bilateral;  laparoscopic  . CERVICAL CERCLAGE  09/01/2011   Procedure: CERCLAGE CERVICAL;  Surgeon: Donnamae Jude, MD;  Location: [facility];  Service: Gynecology;  Laterality: N/A;  . CERVICAL CERCLAGE  06/28/2012   Procedure: CERCLAGE CERVICAL;  Surgeon: Donnamae Jude, MD;  Location: [facility];  Service: Gynecology;  Laterality: N/A;  . DILATION AND CURETTAGE OF UTERUS     SAB  . FINGER SURGERY  2003   reattachment of right forefinger  . THERAPEUTIC ABORTION    . THERAPEUTIC ABORTION    . TONSILLECTOMY    . TUBAL LIGATION  11/23/2012    OB History    Gravida Para Term Preterm AB Living   7 4 2 2 3 2    SAB TAB Ectopic Multiple Live Births   1 2 0 0 3       Home Medications    Prior to Admission medications   Medication Sig Start Date End Date Taking? Authorizing Provider  acetaminophen (TYLENOL) 500 MG tablet Take 1,000 mg by mouth every 6 (six) hours as needed for moderate pain or headache.   Yes [provider]  amLODipine (NORVASC) 5 MG tablet Take 1 tablet (5 mg total)  by mouth daily. 10/27/17  Yes Arnoldo Morale, MD  ibuprofen (ADVIL,MOTRIN) 200 MG tablet Take 800 mg by mouth every 6 (six) hours as needed for headache or moderate pain.   Yes [provider]  losartan-hydrochlorothiazide (HYZAAR) 100-25 MG tablet Take 1 tablet by mouth daily. 10/27/17  Yes Arnoldo Morale, MD  omeprazole (PRILOSEC) 40 MG capsule Take 1 capsule (40 mg total) by mouth daily. 10/27/17  Yes Arnoldo Morale, MD  venlafaxine XR (EFFEXOR-XR) 150 MG 24 hr capsule Take 1 capsule (150 mg total) by mouth daily with breakfast. 10/27/17  Yes Amao, Charlane Ferretti, MD  dicyclomine (BENTYL) 20 MG tablet Take 1 tablet (20 mg total) by mouth every 12 (twelve) hours as needed for spasms (abdominal pain/cramping). 11/09/17   Antonietta Breach, PA-C    ondansetron (ZOFRAN ODT) 4 MG disintegrating tablet Take 1 tablet (4 mg total) by mouth every 8 (eight) hours as needed for nausea or vomiting. 11/09/17   Antonietta Breach, PA-C    Family History Family History  Problem Relation Age of Onset  . Hypertension Mother   . Mental illness Mother        depression; alot of mental illness.  . Cancer Father 32       Colon cancer age 27  . Thyroid disease Maternal Grandmother   . Diabetes Paternal Grandfather   . Anesthesia problems Neg Hx   . Other Neg Hx     Social History Social History   Tobacco Use  . Smoking status: Current Every Day Smoker    Packs/day: 0.50    Years: 20.00    Pack years: 10.00    Types: Cigarettes    Start date: 05/04/2014  . Smokeless tobacco: Never Used  . Tobacco comment: using the vapor only  Substance Use Topics  . Alcohol use: No    Comment: sober since 01/22/11  . Drug use: No    Comment: not currently using     Allergies   Vicodin [hydrocodone-acetaminophen]   Review of Systems Review of Systems Ten systems reviewed and are negative for acute change, except as noted in the HPI.    Physical Exam Updated Vital Signs BP 124/88 (BP Location: Right Arm)   Pulse 89   Temp 98.5 F (36.9 C) (Oral)   Resp 18   Ht 5\' 2"  (1.575 m)   Wt 75.2 kg (165 lb 12.8 oz)   LMP 10/19/2017   SpO2 99%   BMI 30.33 kg/m   Physical Exam  Constitutional: She is oriented to person, place, and time. She appears well-developed and well-nourished. No distress.  Nontoxic appearing and in no acute distress  HENT:  Head: Normocephalic and atraumatic.  Eyes: Conjunctivae and EOM are normal. No scleral icterus.  Neck: Normal range of motion.  Cardiovascular: Normal rate, regular rhythm and intact distal pulses.  Pulmonary/Chest: Effort normal. No stridor. No respiratory distress. She has no wheezes.  Lungs clear to auscultation bilaterally.  Abdominal: Soft.  Soft, obese abdomen.  No focal tenderness to palpation.   No peritoneal signs or guarding.  Musculoskeletal: Normal range of motion.  Neurological: She is alert and oriented to person, place, and time. She exhibits normal muscle tone. Coordination normal.  Skin: Skin is warm and dry. No rash noted. She is not diaphoretic. No erythema. No pallor.  Psychiatric: She has a normal mood and affect. Her behavior is normal.  Nursing note and vitals reviewed.    ED Treatments / Results  Labs (all labs ordered are listed, but only  abnormal results are displayed) Labs Reviewed  COMPREHENSIVE METABOLIC PANEL - Abnormal; Notable for the following components:      Result Value   Sodium 134 (*)    Potassium 3.0 (*)    Glucose, Bld 103 (*)    ALT 86 (*)    All other components within normal limits  CBC - Abnormal; Notable for the following components:   Hemoglobin 11.9 (*)    MCV 72.4 (*)    MCH 23.9 (*)    RDW 15.9 (*)    All other components within normal limits  URINALYSIS, ROUTINE W REFLEX MICROSCOPIC - Abnormal; Notable for the following components:   APPearance HAZY (*)    Leukocytes, UA TRACE (*)    Squamous Epithelial / LPF 6-30 (*)    All other components within normal limits  LIPASE, BLOOD  I-STAT BETA HCG BLOOD, ED ([facility], [facility], [facility] ONLY)    EKG  EKG Interpretation None       Radiology No results found.  Procedures Procedures (including critical care time)  Medications Ordered in ED Medications  ondansetron (ZOFRAN-ODT) disintegrating tablet 8 mg (8 mg Oral Given 11/09/17 2242)  dicyclomine (BENTYL) capsule 10 mg (10 mg Oral Given 11/09/17 2242)     Initial Impression / Assessment and Plan / ED Course  I have reviewed the triage vital signs and the nursing notes.  Pertinent labs & imaging results that were available during my care of the patient were reviewed by me and considered in my medical decision making (see chart for details).     Patient with symptoms consistent with viral gastroenteritis.  Vitals are stable, no  fever.  No signs of dehydration, tolerating POs. Lungs are clear.  No focal abdominal pain, no concern for appendicitis, cholecystitis, pancreatitis, ruptured viscus, UTI, kidney stone, or other emergent abdominal etiology.  Supportive therapy indicated with return if symptoms worsen. Return precautions discussed and provided. Patient discharged in stable condition with no unaddressed concerns.   Final Clinical Impressions(s) / ED Diagnoses   Final diagnoses:  Gastroenteritis    ED Discharge Orders        Ordered    ondansetron (ZOFRAN ODT) 4 MG disintegrating tablet  Every 8 hours PRN     11/09/17 2225    dicyclomine (BENTYL) 20 MG tablet  Every 12 hours PRN     11/09/17 2225       Antonietta Breach, PA-C 11/09/17 2301    Varney Biles, MD 11/10/17 1531

## 2017-12-08 MED FILL — LOSARTAN-HCTZ 100-25 MG TAB: 100-25 | 30 days supply | Qty: 30 | Fill #1

## 2017-12-08 MED FILL — VENLAFAXINE HCL ER 150 MG C: 150 | 30 days supply | Qty: 30 | Fill #1

## 2017-12-08 MED FILL — OMEPRAZOLE DR 40 MG CAPSULE: 40 | 30 days supply | Qty: 30 | Fill #1

## 2018-01-11 MED FILL — LOSARTAN-HCTZ 100-25 MG TAB: 100-25 | 30 days supply | Qty: 30 | Fill #2

## 2018-01-11 MED FILL — VENLAFAXINE HCL ER 150 MG C: 150 | 30 days supply | Qty: 30 | Fill #2

## 2018-01-11 MED FILL — OMEPRAZOLE DR 40 MG CAPSULE: 40 | 30 days supply | Qty: 30 | Fill #2

## 2018-01-26 ENCOUNTER — Ambulatory Visit: Payer: Self-pay | Admitting: Family Medicine

## 2018-02-18 MED FILL — VENLAFAXINE HCL ER 150 MG C: 150 | 30 days supply | Qty: 30 | Fill #3

## 2018-02-18 MED FILL — LOSARTAN-HCTZ 100-25 MG TAB: 100-25 | 30 days supply | Qty: 30 | Fill #3

## 2018-02-18 MED FILL — OMEPRAZOLE DR 40 MG CAPSULE: 40 | 30 days supply | Qty: 30 | Fill #3

## 2018-03-15 MED FILL — LOSARTAN-HCTZ 100-25 MG TAB: 100-25 | 30 days supply | Qty: 30 | Fill #4

## 2018-03-15 MED FILL — VENLAFAXINE HCL ER 150 MG C: 150 | 30 days supply | Qty: 30 | Fill #4

## 2018-03-15 MED FILL — OMEPRAZOLE DR 40 MG CAPSULE: 40 | 30 days supply | Qty: 30 | Fill #4

## 2018-04-14 ENCOUNTER — Encounter: Payer: Self-pay | Admitting: Family Medicine

## 2018-04-22 MED FILL — VENLAFAXINE HCL ER 150 MG C: 150 | 30 days supply | Qty: 30 | Fill #5

## 2018-04-22 MED FILL — OMEPRAZOLE DR 40 MG CAPSULE: 40 | 30 days supply | Qty: 30 | Fill #5

## 2018-04-22 MED FILL — LOSARTAN-HCTZ 100-25 MG TAB: 100-25 | 30 days supply | Qty: 30 | Fill #5

## 2018-05-18 ENCOUNTER — Other Ambulatory Visit: Payer: Self-pay | Admitting: Family Medicine

## 2018-05-18 DIAGNOSIS — F419 Anxiety disorder, unspecified: Secondary | ICD-10-CM

## 2018-05-24 ENCOUNTER — Other Ambulatory Visit: Payer: Self-pay | Admitting: Family Medicine

## 2018-05-24 DIAGNOSIS — F419 Anxiety disorder, unspecified: Secondary | ICD-10-CM

## 2018-06-06 ENCOUNTER — Ambulatory Visit (HOSPITAL_COMMUNITY)
Admission: EM | Admit: 2018-06-06 | Discharge: 2018-06-06 | Disposition: A | Discharge status: Home / Self Care | Payer: Self-pay | Attending: Family Medicine | Admitting: Family Medicine

## 2018-06-06 ENCOUNTER — Encounter (HOSPITAL_COMMUNITY): Payer: Self-pay | Admitting: Emergency Medicine

## 2018-06-06 DIAGNOSIS — K219 Gastro-esophageal reflux disease without esophagitis: Secondary | ICD-10-CM

## 2018-06-06 DIAGNOSIS — F3289 Other specified depressive episodes: Secondary | ICD-10-CM

## 2018-06-06 DIAGNOSIS — Z760 Encounter for issue of repeat prescription: Secondary | ICD-10-CM

## 2018-06-06 DIAGNOSIS — R1010 Upper abdominal pain, unspecified: Secondary | ICD-10-CM

## 2018-06-06 DIAGNOSIS — F419 Anxiety disorder, unspecified: Secondary | ICD-10-CM

## 2018-06-06 DIAGNOSIS — I10 Essential (primary) hypertension: Secondary | ICD-10-CM

## 2018-06-06 DIAGNOSIS — R61 Generalized hyperhidrosis: Secondary | ICD-10-CM

## 2018-06-06 MED ORDER — OMEPRAZOLE 40 MG PO CPDR: 40.0000 mg | DELAYED_RELEASE_CAPSULE | Freq: Every day | ORAL | 0 refills | Status: DC

## 2018-06-06 MED ORDER — LOSARTAN POTASSIUM-HCTZ 100-25 MG PO TABS: 1.0000 | ORAL_TABLET | Freq: Every day | ORAL | 0 refills | Status: DC

## 2018-06-06 MED ORDER — VENLAFAXINE HCL ER 150 MG PO CP24: 150.0000 mg | ORAL_CAPSULE | Freq: Every day | ORAL | 0 refills | Status: DC

## 2018-06-06 NOTE — ED Triage Notes (Signed)
Pt requesting refill of all her meds.

## 2018-06-06 NOTE — Discharge Instructions (Signed)
Medicines are refilled as discussed Return if needed

## 2018-06-06 NOTE — ED Provider Notes (Signed)
[facility]    CSN: [id number] Arrival date & time: 06/06/18  1401     History   Chief Complaint Chief Complaint  Patient presents with  . Appointment    2pm  . Medication Refill    HPI [name] is a 42 y.o. female.   HPI  Patient is here for medicine refills.  She previously went to health clinic.  She missed a couple appointments.  Now she is not able to get her medicine refills.  She is to reapply for the clinic.  She is scheduled to be admitted tomorrow to a substance abuse rehab center.  She states she needs 30 days of her medicines. Her only symptom is some increased anxiety and tearfulness since going off of her Effexor 2 days ago.  I discussed that with serotonin reuptake inhibitors that this is not unusual.  Assuming she is back on her Effexor she should start feeling better.   Past Medical History:  Diagnosis Date  . Abnormal Pap smear 1998   had colpo  . Anxiety   . Asthma    ,as child only   no inhaler  . Bicornate uterus   . Bicornuate uterus   . Chlamydia 2000  . GERD (gastroesophageal reflux disease)   . HCV infection    HX of Hep C  . Headache(784.0)   . Hypertension 2010  . IUP (intrauterine pregnancy), incidental 13 weeks  . Opiate dependence ([place])    stopped methadone 01/2011  . PONV (postoperative nausea and vomiting)   . STD (female)    HX of STD's    Patient Active Problem List   Diagnosis Date Noted  . Gastroesophageal reflux disease 03/08/2017  . Anxiety 03/08/2017  . Anxiety and depression 10/10/2015  . History of intravenous drug use in remission 09/07/2011  . HCV infection   . Essential hypertension   . Bipolar disorder ([place])   . Bicornuate uterus     Past Surgical History:  Procedure Laterality Date  . BILATERAL SALPINGECTOMY  12/06/2012   Procedure: BILATERAL SALPINGECTOMY;  Surgeon: Mora Bellman, MD;  Location: [facility];  Service: Gynecology;  Laterality: Bilateral;  laparoscopic  . CERVICAL CERCLAGE   09/01/2011   Procedure: CERCLAGE CERVICAL;  Surgeon: Donnamae Jude, MD;  Location: [facility];  Service: Gynecology;  Laterality: N/A;  . CERVICAL CERCLAGE  06/28/2012   Procedure: CERCLAGE CERVICAL;  Surgeon: Donnamae Jude, MD;  Location: [facility];  Service: Gynecology;  Laterality: N/A;  . DILATION AND CURETTAGE OF UTERUS     SAB  . FINGER SURGERY  2003   reattachment of right forefinger  . THERAPEUTIC ABORTION    . THERAPEUTIC ABORTION    . TONSILLECTOMY    . TUBAL LIGATION  11/23/2012    OB History    Gravida  7   Para  4   Term  2   Preterm  2   AB  3   Living  2     SAB  1   TAB  2   Ectopic  0   Multiple  0   Live Births  3            Home Medications    Prior to Admission medications   Medication Sig Start Date End Date Taking? Authorizing Provider  acetaminophen (TYLENOL) 500 MG tablet Take 1,000 mg by mouth every 6 (six) hours as needed for moderate pain or headache.    [provider]  ibuprofen (ADVIL,MOTRIN) 200  MG tablet Take 800 mg by mouth every 6 (six) hours as needed for headache or moderate pain.    [provider]  losartan-hydrochlorothiazide (HYZAAR) 100-25 MG tablet Take 1 tablet by mouth daily. 06/06/18   Raylene Everts, MD  omeprazole (PRILOSEC) 40 MG capsule Take 1 capsule (40 mg total) by mouth daily. 06/06/18   Raylene Everts, MD  ondansetron (ZOFRAN ODT) 4 MG disintegrating tablet Take 1 tablet (4 mg total) by mouth every 8 (eight) hours as needed for nausea or vomiting. 11/09/17   Antonietta Breach, PA-C  venlafaxine XR (EFFEXOR-XR) 150 MG 24 hr capsule Take 1 capsule (150 mg total) by mouth daily with breakfast. 06/06/18   Raylene Everts, MD    Family History Family History  Problem Relation Age of Onset  . Hypertension Mother   . Mental illness Mother        depression; alot of mental illness.  . Cancer Father 22       Colon cancer age 26  . Thyroid disease Maternal Grandmother   . Diabetes Paternal  Grandfather   . Anesthesia problems Neg Hx   . Other Neg Hx     Social History Social History   Tobacco Use  . Smoking status: Current Every Day Smoker    Packs/day: 0.50    Years: 20.00    Pack years: 10.00    Types: Cigarettes    Start date: 05/04/2014  . Smokeless tobacco: Never Used  . Tobacco comment: using the vapor only  Substance Use Topics  . Alcohol use: No    Comment: sober since 01/22/11  . Drug use: No    Types: Cocaine, IV    Comment: not currently using     Allergies   Vicodin [hydrocodone-acetaminophen]   Review of Systems Review of Systems  Constitutional: Positive for diaphoresis. Negative for chills and fever.       Sweats  HENT: Negative for ear pain and sore throat.   Eyes: Negative for pain and visual disturbance.  Respiratory: Negative for cough and shortness of breath.   Cardiovascular: Negative for chest pain and palpitations.  Gastrointestinal: Positive for abdominal pain and diarrhea. Negative for vomiting.       Abdominal cramping  Genitourinary: Negative for dysuria and hematuria.  Musculoskeletal: Negative for arthralgias and back pain.  Skin: Negative for color change and rash.  Neurological: Negative for seizures and syncope.  Psychiatric/Behavioral: Positive for dysphoric mood. The patient is nervous/anxious.   All other systems reviewed and are negative.    Physical Exam Triage Vital Signs ED Triage Vitals [06/06/18 1415]  Enc Vitals Group     BP 96/69     Pulse Rate 92     Resp 16     Temp 98.1 F (36.7 C)     Temp Source Oral     SpO2 98 %     Weight      Height      Head Circumference      Peak Flow      Pain Score      Pain Loc      Pain Edu?      Excl. in [place]?    No data found.  Updated Vital Signs BP 96/69 (BP Location: Right Arm)   Pulse 92   Temp 98.1 F (36.7 C) (Oral)   Resp 16   SpO2 98%      Physical Exam  Constitutional: She appears well-developed and well-nourished. No distress.  Tearful  HENT:  Head: Normocephalic and atraumatic.  Mouth/Throat: Oropharynx is clear and moist.  Eyes: Pupils are equal, round, and reactive to light. Conjunctivae are normal.  Neck: Normal range of motion.  Cardiovascular: Normal rate.  Pulmonary/Chest: Effort normal. No respiratory distress.  Abdominal: Soft. She exhibits no distension.  Musculoskeletal: Normal range of motion. She exhibits no edema.  Neurological: She is alert.  Skin: Skin is warm and dry.  Psychiatric: Her behavior is normal. Thought content normal.     UC Treatments / Results  Labs (all labs ordered are listed, but only abnormal results are displayed) Labs Reviewed - No data to display  EKG None  Radiology No results found.  Procedures Procedures (including critical care time)  Medications Ordered in UC Medications - No data to display  Initial Impression / Assessment and Plan / UC Course  I have reviewed the triage vital signs and the nursing notes.  Pertinent labs & imaging results that were available during my care of the patient were reviewed by me and considered in my medical decision making (see chart for details).     Patient was congratulated on her recognition that her addiction is out of control, and her decision to go into rehab.  She is here with her daughter who is supportive.  Medicines are refilled.  Getting back in with her PCP is very important.  She states she will call Final Clinical Impressions(s) / UC Diagnoses   Final diagnoses:  Medication refill     Discharge Instructions     Medicines are refilled as discussed Return if needed   ED Prescriptions    Medication Sig Dispense Auth. Provider   losartan-hydrochlorothiazide (HYZAAR) 100-25 MG tablet Take 1 tablet by mouth daily. 30 tablet Raylene Everts, MD   omeprazole (PRILOSEC) 40 MG capsule Take 1 capsule (40 mg total) by mouth daily. 30 capsule Raylene Everts, MD   venlafaxine XR (EFFEXOR-XR) 150 MG 24 hr capsule  Take 1 capsule (150 mg total) by mouth daily with breakfast. 30 capsule Raylene Everts, MD     Controlled Substance Prescriptions  Controlled Substance Registry consulted? Not Applicable   Raylene Everts, MD 06/06/18 [phone]

## 2019-02-04 ENCOUNTER — Encounter: Payer: Self-pay | Admitting: Emergency Medicine

## 2019-02-04 ENCOUNTER — Other Ambulatory Visit: Payer: Self-pay

## 2019-02-04 ENCOUNTER — Emergency Department
Admission: EM | Admit: 2019-02-04 | Discharge: 2019-02-04 | Disposition: A | Discharge status: Home / Self Care | Payer: Self-pay | Attending: Emergency Medicine | Admitting: Emergency Medicine

## 2019-02-04 DIAGNOSIS — N3000 Acute cystitis without hematuria: Secondary | ICD-10-CM | POA: Insufficient documentation

## 2019-02-04 DIAGNOSIS — F17210 Nicotine dependence, cigarettes, uncomplicated: Secondary | ICD-10-CM | POA: Insufficient documentation

## 2019-02-04 DIAGNOSIS — J45909 Unspecified asthma, uncomplicated: Secondary | ICD-10-CM | POA: Insufficient documentation

## 2019-02-04 DIAGNOSIS — Z79899 Other long term (current) drug therapy: Secondary | ICD-10-CM | POA: Insufficient documentation

## 2019-02-04 DIAGNOSIS — I10 Essential (primary) hypertension: Secondary | ICD-10-CM | POA: Insufficient documentation

## 2019-02-04 LAB — URINALYSIS, COMPLETE (UACMP) WITH MICROSCOPIC
BILIRUBIN URINE: NEGATIVE
Glucose, UA: NEGATIVE mg/dL
Ketones, ur: NEGATIVE mg/dL
NITRITE: NEGATIVE
PH: 6 (ref 5.0–8.0)
Protein, ur: NEGATIVE mg/dL
SPECIFIC GRAVITY, URINE: 1.003 — AB (ref 1.005–1.030)

## 2019-02-04 LAB — POCT PREGNANCY, URINE: PREG TEST UR: NEGATIVE

## 2019-02-04 MED ORDER — PHENAZOPYRIDINE HCL 100 MG PO TABS: 100.0000 mg | ORAL_TABLET | Freq: Three times a day (TID) | ORAL | 0 refills | Status: AC | PRN

## 2019-02-04 MED ORDER — CEPHALEXIN 500 MG PO CAPS: 500.0000 mg | ORAL_CAPSULE | Freq: Two times a day (BID) | ORAL | 0 refills | Status: AC

## 2019-02-04 NOTE — ED Triage Notes (Signed)
Urinary pressure x 3 to 4 days, now urinary odor.

## 2019-02-04 NOTE — ED Provider Notes (Signed)
[facility] Emergency Department Provider Note  ____________________________________________  Time seen: Approximately 3:00 PM  I have reviewed the triage vital signs and the nursing notes.   HISTORY  Chief Complaint Dysuria    HPI [name] is a 43 y.o. female that presents emergency department for evaluation of dysuria and urinary urgency for 2 days.  Patient states that when symptoms started, she also started her menstrual cycle.  She has had 1 urinary tract infection previously and this feels the same.  Symptoms mildly improved with Azo.  No fever, back pain, nausea, vomiting, abdominal pain.  Past Medical History:  Diagnosis Date  . Abnormal Pap smear 1998   had colpo  . Anxiety   . Asthma    ,as child only   no inhaler  . Bicornate uterus   . Bicornuate uterus   . Chlamydia 2000  . GERD (gastroesophageal reflux disease)   . HCV infection    HX of Hep C  . Headache(784.0)   . Hypertension 2010  . IUP (intrauterine pregnancy), incidental 13 weeks  . Opiate dependence ([place])    stopped methadone 01/2011  . PONV (postoperative nausea and vomiting)   . STD (female)    HX of STD's    Patient Active Problem List   Diagnosis Date Noted  . Gastroesophageal reflux disease 03/08/2017  . Anxiety 03/08/2017  . Anxiety and depression 10/10/2015  . History of intravenous drug use in remission 09/07/2011  . HCV infection   . Essential hypertension   . Bipolar disorder ([place])   . Bicornuate uterus     Past Surgical History:  Procedure Laterality Date  . BILATERAL SALPINGECTOMY  12/06/2012   Procedure: BILATERAL SALPINGECTOMY;  Surgeon: Mora Bellman, MD;  Location: [facility];  Service: Gynecology;  Laterality: Bilateral;  laparoscopic  . CERVICAL CERCLAGE  09/01/2011   Procedure: CERCLAGE CERVICAL;  Surgeon: Donnamae Jude, MD;  Location: [facility];  Service: Gynecology;  Laterality: N/A;  . CERVICAL CERCLAGE  06/28/2012   Procedure: CERCLAGE  CERVICAL;  Surgeon: Donnamae Jude, MD;  Location: [facility];  Service: Gynecology;  Laterality: N/A;  . DILATION AND CURETTAGE OF UTERUS     SAB  . FINGER SURGERY  2003   reattachment of right forefinger  . THERAPEUTIC ABORTION    . THERAPEUTIC ABORTION    . TONSILLECTOMY    . TUBAL LIGATION  11/23/2012    Prior to Admission medications   Medication Sig Start Date End Date Taking? Authorizing Provider  acetaminophen (TYLENOL) 500 MG tablet Take 1,000 mg by mouth every 6 (six) hours as needed for moderate pain or headache.    [provider]  cephALEXin (KEFLEX) 500 MG capsule Take 1 capsule (500 mg total) by mouth 2 (two) times daily for 10 days. 02/04/19 02/14/19  Laban Emperor, PA-C  ibuprofen (ADVIL,MOTRIN) 200 MG tablet Take 800 mg by mouth every 6 (six) hours as needed for headache or moderate pain.    [provider]  losartan-hydrochlorothiazide (HYZAAR) 100-25 MG tablet Take 1 tablet by mouth daily. 06/06/18   Raylene Everts, MD  omeprazole (PRILOSEC) 40 MG capsule Take 1 capsule (40 mg total) by mouth daily. 06/06/18   Raylene Everts, MD  ondansetron (ZOFRAN ODT) 4 MG disintegrating tablet Take 1 tablet (4 mg total) by mouth every 8 (eight) hours as needed for nausea or vomiting. 11/09/17   Antonietta Breach, PA-C  phenazopyridine (PYRIDIUM) 100 MG tablet Take 1 tablet (100 mg total) by mouth  3 (three) times daily as needed for up to 2 days for pain. 02/04/19 02/06/19  Laban Emperor, PA-C  venlafaxine XR (EFFEXOR-XR) 150 MG 24 hr capsule Take 1 capsule (150 mg total) by mouth daily with breakfast. 06/06/18   Raylene Everts, MD    Allergies Vicodin [hydrocodone-acetaminophen]  Family History  Problem Relation Age of Onset  . Hypertension Mother   . Mental illness Mother        depression; alot of mental illness.  . Cancer Father 17       Colon cancer age 40  . Thyroid disease Maternal Grandmother   . Diabetes Paternal Grandfather   . Anesthesia problems Neg  Hx   . Other Neg Hx     Social History Social History   Tobacco Use  . Smoking status: Current Every Day Smoker    Packs/day: 0.50    Years: 20.00    Pack years: 10.00    Types: Cigarettes    Start date: 05/04/2014  . Smokeless tobacco: Never Used  . Tobacco comment: using the vapor only  Substance Use Topics  . Alcohol use: No    Comment: sober since 01/22/11  . Drug use: No    Types: Cocaine, IV    Comment: not currently using     Review of Systems  Constitutional: No fever/chills Respiratory: No SOB. Gastrointestinal: No abdominal pain.  No nausea, no vomiting.  Genitourinary: Positive for dysuria. Musculoskeletal: Negative for musculoskeletal pain. Skin: Negative for rash, abrasions, lacerations, ecchymosis.   ____________________________________________   PHYSICAL EXAM:  VITAL SIGNS: ED Triage Vitals [02/04/19 1342]  Enc Vitals Group     BP (!) 128/91     Pulse Rate 94     Resp 18     Temp 98.1 F (36.7 C)     Temp Source Oral     SpO2 95 %     Weight 170 lb (77.1 kg)     Height 5' (1.524 m)     Head Circumference      Peak Flow      Pain Score 0     Pain Loc      Pain Edu?      Excl. in [place]?      Constitutional: Alert and oriented. Well appearing and in no acute distress. Eyes: Conjunctivae are normal. PERRL. EOMI. Head: Atraumatic. ENT:      Ears:      Nose: No congestion/rhinnorhea.      Mouth/Throat: Mucous membranes are moist.  Neck: No stridor. Cardiovascular: Normal rate, regular rhythm.  Good peripheral circulation. Respiratory: Normal respiratory effort without tachypnea or retractions. Lungs CTAB. Good air entry to the bases with no decreased or absent breath sounds. Gastrointestinal: Bowel sounds 4 quadrants. Soft and nontender to palpation. No guarding or rigidity. No palpable masses. No distention. No CVA tenderness. Musculoskeletal: Full range of motion to all extremities. No gross deformities appreciated. Neurologic:  Normal  speech and language. No gross focal neurologic deficits are appreciated.  Skin:  Skin is warm, dry and intact. No rash noted. Psychiatric: Mood and affect are normal. Speech and behavior are normal. Patient exhibits appropriate insight and judgement.   ____________________________________________   LABS (all labs ordered are listed, but only abnormal results are displayed)  Labs Reviewed  URINALYSIS, COMPLETE (UACMP) WITH MICROSCOPIC - Abnormal; Notable for the following components:      Result Value   Color, Urine YELLOW (*)    APPearance HAZY (*)    Specific Gravity, Urine  1.003 (*)    Hgb urine dipstick SMALL (*)    Leukocytes,Ua MODERATE (*)    Bacteria, UA MANY (*)    All other components within normal limits  POC URINE PREG, ED  POCT PREGNANCY, URINE   ____________________________________________  EKG   ____________________________________________  RADIOLOGY   No results found.  ____________________________________________    PROCEDURES  Procedure(s) performed:    Procedures    Medications - No data to display   ____________________________________________   INITIAL IMPRESSION / ASSESSMENT AND PLAN / ED COURSE  Pertinent labs & imaging results that were available during my care of the patient were reviewed by me and considered in my medical decision making (see chart for details).  Review of the [place] CSRS was performed in accordance of the [place] prior to dispensing any controlled drugs.     Patient's diagnosis is consistent with UTI. Urinalysis consistent with infection. Patient will be discharged home with prescriptions for keflex, pyridium. Patient is to follow up with PCP as directed. Patient is given ED precautions to return to the ED for any worsening or new symptoms.     ____________________________________________  FINAL CLINICAL IMPRESSION(S) / ED DIAGNOSES  Final diagnoses:  Acute cystitis without hematuria      NEW MEDICATIONS  STARTED DURING THIS VISIT:  ED Discharge Orders         Ordered    cephALEXin (KEFLEX) 500 MG capsule  2 times daily     02/04/19 1501    phenazopyridine (PYRIDIUM) 100 MG tablet  3 times daily PRN     02/04/19 1501              This chart was dictated using voice recognition software/Dragon. Despite best efforts to proofread, errors can occur which can change the meaning. Any change was purely unintentional.    Laban Emperor, PA-C 02/04/19 1533    Harvest Dark, MD 02/04/19 [phone]

## 2019-02-18 ENCOUNTER — Encounter (HOSPITAL_COMMUNITY): Payer: Self-pay

## 2019-02-18 ENCOUNTER — Ambulatory Visit (HOSPITAL_COMMUNITY)
Admission: EM | Admit: 2019-02-18 | Discharge: 2019-02-18 | Disposition: A | Discharge status: Home / Self Care | Payer: Self-pay | Attending: Family Medicine | Admitting: Family Medicine

## 2019-02-18 ENCOUNTER — Other Ambulatory Visit: Payer: Self-pay

## 2019-02-18 DIAGNOSIS — R600 Localized edema: Secondary | ICD-10-CM

## 2019-02-18 MED ORDER — FUROSEMIDE 20 MG PO TABS: 20.0000 mg | ORAL_TABLET | Freq: Every day | ORAL | 0 refills | Status: DC | PRN

## 2019-02-18 NOTE — ED Triage Notes (Signed)
Pt cc feet and ankle swelling. X a month. Pt states she has been running a fever.  Legs, knees and abdominal area 3 days.

## 2019-02-18 NOTE — ED Notes (Signed)
Patient verbalizes understanding of discharge instructions. Opportunity for questioning and answers were provided. Patient discharged from [facility].  

## 2019-02-18 NOTE — Discharge Instructions (Signed)
Swelling happens when fluid collects in small spaces around tissues and organs inside the body. Another word for swelling is "edema." Some common parts of the body where people can have swelling are the lower legs or hands. This typically is worse in the areas of the body that are closest to the ground (because of gravity)  Symptoms of swelling can include puffiness of the skin, which can cause the skin to look stretched and shiny. This often occurs with swelling in the lower legs and can be worse after you sit or stand for a long time.  Treatment of edema includes several components: treatment of the underlying cause (if possible), reducing the amount of salt (sodium) in your diet, and, in many cases, use of a medication called a diuretic to eliminate excess fluid. Using compression stockings and elevating the legs may also be recommended.   

## 2019-02-22 NOTE — ED Provider Notes (Signed)
[place]   [id number] 02/18/19 Arrival Time: 6948  ASSESSMENT & PLAN:  1. Bilateral lower extremity edema    Declines lab work today. Unclear etiology at this time. Discussed. Recommend est care with PCP if this continues.  Trial of: Meds ordered this encounter  Medications   furosemide (LASIX) 20 MG tablet    Sig: Take 1 tablet (20 mg total) by mouth daily as needed for edema.    Dispense:  30 tablet    Refill:  0      Discharge Instructions     Swelling happens when fluid collects in small spaces around tissues and organs inside the body. Another word for swelling is "edema." Some common parts of the body where people can have swelling are the lower legs or hands. This typically is worse in the areas of the body that are closest to the ground (because of gravity)  Symptoms of swelling can include puffiness of the skin, which can cause the skin to look stretched and shiny. This often occurs with swelling in the lower legs and can be worse after you sit or stand for a long time.  Treatment of edema includes several components: treatment of the underlying cause (if possible), reducing the amount of salt (sodium) in your diet, and, in many cases, use of a medication called a diuretic to eliminate excess fluid. Using compression stockings and elevating the legs may also be recommended.     No concern for DVT. Reassured.  Reviewed expectations re: course of current medical issues. Questions answered. Outlined signs and symptoms indicating need for more acute intervention. Patient verbalized understanding. After Visit Summary given.   SUBJECTIVE: History from: patient. [name] is a 43 y.o. female who presents with complaint of noticed bilateral LE edema. Gradual onset over the past month. Worse in evening. Better in morning when she wakes. No swelling in hands or face. No new medications. No recent travel or prolonged immobility. No h/o similar. Questions  if she has been running a low grade temp. No abdominal or back pain. Normal urination. Normal bowel habits. Described symptoms have progressed to a point and plateaued since beginning. Stands a lot at work. Ankles are very sore at the end of day.  ROS: As per HPI. All other systems negative.    OBJECTIVE:  Vitals:   02/18/19 1127 02/18/19 1132  BP:  (!) 136/101  Pulse:  82  Resp:  16  Temp:  97.8 F (36.6 C)  SpO2:  98%  Weight: 76.7 kg     General appearance: alert; no distress Eyes: PERRLA; EOMI; conjunctiva normal HENT: normocephalic; atraumatic; nasal mucosa normal; oral mucosa normal Neck: supple  Lungs: clear to auscultation bilaterally; unlabored Heart: regular rate and rhythm without murmer Abdomen: soft, non-tender Extremities: moderate 1+ pitting edema around bilateral ankles; no skin erythema; no open wounds; symmetrical with no gross deformities; bilateral calfs without swelling Skin: warm and dry Neurologic: normal gait; normal symmetric reflexes Psychological: alert and cooperative; normal mood and affect  Labs Reviewed: Results for orders placed or performed during the hospital encounter of 02/04/19  Urinalysis, Complete w Microscopic  Result Value Ref Range   Color, Urine YELLOW (A) YELLOW   APPearance HAZY (A) CLEAR   Specific Gravity, Urine 1.003 (L) 1.005 - 1.030   pH 6.0 5.0 - 8.0   Glucose, UA NEGATIVE NEGATIVE mg/dL   Hgb urine dipstick SMALL (A) NEGATIVE   Bilirubin Urine NEGATIVE NEGATIVE   Ketones, ur NEGATIVE NEGATIVE mg/dL  Protein, ur NEGATIVE NEGATIVE mg/dL   Nitrite NEGATIVE NEGATIVE   Leukocytes,Ua MODERATE (A) NEGATIVE   RBC / HPF 0-5 0 - 5 RBC/hpf   WBC, UA 21-50 0 - 5 WBC/hpf   Bacteria, UA MANY (A) NONE SEEN   Squamous Epithelial / LPF 6-10 0 - 5   Mucus PRESENT   Pregnancy, urine POC  Result Value Ref Range   Preg Test, Ur NEGATIVE NEGATIVE    Allergies  Allergen Reactions   Vicodin [Hydrocodone-Acetaminophen] Nausea And  Vomiting    Past Medical History:  Diagnosis Date   Abnormal Pap smear 1998   had colpo   Anxiety    Asthma    ,as child only   no inhaler   Bicornate uterus    Bicornuate uterus    Chlamydia 2000   GERD (gastroesophageal reflux disease)    HCV infection    HX of Hep C   Headache(784.0)    Hypertension 2010   IUP (intrauterine pregnancy), incidental 13 weeks   Opiate dependence ([place])    stopped methadone 01/2011   PONV (postoperative nausea and vomiting)    STD (female)    HX of STD's   Social History   Socioeconomic History   Marital status: Single    Spouse name: Not on file   Number of children: Not on file   Years of education: Not on file   Highest education level: Not on file  Occupational History   Not on file  Social Needs   Financial resource strain: Not on file   Food insecurity:    Worry: Not on file    Inability: Not on file   Transportation needs:    Medical: Not on file    Non-medical: Not on file  Tobacco Use   Smoking status: Current Every Day Smoker    Packs/day: 0.50    Years: 20.00    Pack years: 10.00    Types: Cigarettes    Start date: 05/04/2014   Smokeless tobacco: Never Used   Tobacco comment: using the vapor only  Substance and Sexual Activity   Alcohol use: No    Comment: sober since 01/22/11   Drug use: No    Types: Cocaine, IV    Comment: not currently using   Sexual activity: Yes    Birth control/protection: Surgical  Lifestyle   Physical activity:    Days per week: Not on file    Minutes per session: Not on file   Stress: Not on file  Relationships   Social connections:    Talks on phone: Not on file    Gets together: Not on file    Attends religious service: Not on file    Active member of club or organization: Not on file    Attends meetings of clubs or organizations: Not on file    Relationship status: Not on file   Intimate partner violence:    Fear of current or ex partner: Not on  file    Emotionally abused: Not on file    Physically abused: Not on file    Forced sexual activity: Not on file  Other Topics Concern   Not on file  Social History Narrative   Marital status: single; dating seriously x 3 years in 2016; males only.     Children: 2 children; no grandchildren.     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      Drugs:  Clean 02/14/2013; attends NA every day; sponsor.        Sexual activity:  Total partners = 150.  +chlamydia in 1998.        Seatbelt: 100%      Guns:     Family History  Problem Relation Age of Onset   Hypertension Mother    Mental illness Mother        depression; alot of mental illness.   Cancer Father 57       Colon cancer age 79   Thyroid disease Maternal Grandmother    Diabetes Paternal Grandfather    Anesthesia problems Neg Hx    Other Neg Hx    Past Surgical History:  Procedure Laterality Date   BILATERAL SALPINGECTOMY  12/06/2012   Procedure: BILATERAL SALPINGECTOMY;  Surgeon: Mora Bellman, MD;  Location: [facility];  Service: Gynecology;  Laterality: Bilateral;  laparoscopic   CERVICAL CERCLAGE  09/01/2011   Procedure: CERCLAGE CERVICAL;  Surgeon: Donnamae Jude, MD;  Location: [facility];  Service: Gynecology;  Laterality: N/A;   CERVICAL CERCLAGE  06/28/2012   Procedure: CERCLAGE CERVICAL;  Surgeon: Donnamae Jude, MD;  Location: [facility];  Service: Gynecology;  Laterality: N/A;   DILATION AND CURETTAGE OF UTERUS     SAB   FINGER SURGERY  2003   reattachment of right forefinger   THERAPEUTIC ABORTION     THERAPEUTIC ABORTION     TONSILLECTOMY     TUBAL LIGATION  11/23/2012     Vanessa Kick, MD 02/22/19 1213

## 2019-02-23 ENCOUNTER — Ambulatory Visit (HOSPITAL_COMMUNITY)
Admission: EM | Admit: 2019-02-23 | Discharge: 2019-02-23 | Disposition: A | Discharge status: Home / Self Care | Payer: Self-pay | Attending: Family Medicine | Admitting: Family Medicine

## 2019-02-23 ENCOUNTER — Other Ambulatory Visit: Payer: Self-pay

## 2019-02-23 ENCOUNTER — Encounter (HOSPITAL_COMMUNITY): Payer: Self-pay

## 2019-02-23 DIAGNOSIS — R102 Pelvic and perineal pain: Secondary | ICD-10-CM

## 2019-02-23 DIAGNOSIS — R600 Localized edema: Secondary | ICD-10-CM

## 2019-02-23 LAB — POCT URINALYSIS DIP (DEVICE)
Bilirubin Urine: NEGATIVE
Glucose, UA: NEGATIVE mg/dL
Hgb urine dipstick: NEGATIVE
Ketones, ur: NEGATIVE mg/dL
Leukocytes,Ua: NEGATIVE
Nitrite: NEGATIVE
Protein, ur: NEGATIVE mg/dL
Specific Gravity, Urine: 1.015 (ref 1.005–1.030)
Urobilinogen, UA: 0.2 mg/dL (ref 0.0–1.0)
pH: 5.5 (ref 5.0–8.0)

## 2019-02-23 NOTE — ED Triage Notes (Addendum)
Pt states she still has a UTI and has had swelling in her feet  and fever. Pt states her urine still has a odor. Pt states she needs antibiotic.

## 2019-02-23 NOTE — Discharge Instructions (Addendum)
Wear compression stockings for the swelling Elevate the legs Use the lasix as needed Urine was negative for infection.  Follow up as needed for continued or worsening symptoms

## 2019-02-23 NOTE — ED Notes (Signed)
Patient verbalizes understanding of discharge instructions. Opportunity for questioning and answers were provided. Patient discharged from [facility] by provider.  

## 2019-02-23 NOTE — ED Provider Notes (Signed)
[place]    CSN: [id number] Arrival date & time: 02/23/19  1003     History   Chief Complaint Chief Complaint  Patient presents with  . Urinary Tract Infection    HPI [name] is a 43 y.o. female.   Patient is a 43 year old female with past medical history of anxiety, asthma, GERD, hypertension.  She presents today with continued suprapubic pressure and lower extremity edema.  She has been seen for this multiple times. Full work up done in the ER.  She was recently treated for a urinary tract infection and given Lasix for the lower extremity edema.  The edema has improved.  She was also instructed to get compression stockings and use those when she is standing for long periods or sitting and she has not yet purchased those. She has been taking AZO for bladder symptoms.  Denies any associated fever, chills, flank pain, back pain, nausea, vomiting.  Denies any chest pain, shortness of breath.  No calf pain or tenderness.  ROS per HPI      Past Medical History:  Diagnosis Date  . Abnormal Pap smear 1998   had colpo  . Anxiety   . Asthma    ,as child only   no inhaler  . Bicornate uterus   . Bicornuate uterus   . Chlamydia 2000  . GERD (gastroesophageal reflux disease)   . HCV infection    HX of Hep C  . Headache(784.0)   . Hypertension 2010  . IUP (intrauterine pregnancy), incidental 13 weeks  . Opiate dependence ([place])    stopped methadone 01/2011  . PONV (postoperative nausea and vomiting)   . STD (female)    HX of STD's    Patient Active Problem List   Diagnosis Date Noted  . Gastroesophageal reflux disease 03/08/2017  . Anxiety 03/08/2017  . Anxiety and depression 10/10/2015  . History of intravenous drug use in remission 09/07/2011  . HCV infection   . Essential hypertension   . Bipolar disorder ([place])   . Bicornuate uterus     Past Surgical History:  Procedure Laterality Date  . BILATERAL SALPINGECTOMY  12/06/2012   Procedure:  BILATERAL SALPINGECTOMY;  Surgeon: Mora Bellman, MD;  Location: [facility];  Service: Gynecology;  Laterality: Bilateral;  laparoscopic  . CERVICAL CERCLAGE  09/01/2011   Procedure: CERCLAGE CERVICAL;  Surgeon: Donnamae Jude, MD;  Location: [facility];  Service: Gynecology;  Laterality: N/A;  . CERVICAL CERCLAGE  06/28/2012   Procedure: CERCLAGE CERVICAL;  Surgeon: Donnamae Jude, MD;  Location: [facility];  Service: Gynecology;  Laterality: N/A;  . DILATION AND CURETTAGE OF UTERUS     SAB  . FINGER SURGERY  2003   reattachment of right forefinger  . THERAPEUTIC ABORTION    . THERAPEUTIC ABORTION    . TONSILLECTOMY    . TUBAL LIGATION  11/23/2012    OB History    Gravida  7   Para  4   Term  2   Preterm  2   AB  3   Living  2     SAB  1   TAB  2   Ectopic  0   Multiple  0   Live Births  3            Home Medications    Prior to Admission medications   Medication Sig Start Date End Date Taking? Authorizing Provider  acetaminophen (TYLENOL) 500 MG tablet Take 1,000 mg by mouth every  6 (six) hours as needed for moderate pain or headache.    [provider]  furosemide (LASIX) 20 MG tablet Take 1 tablet (20 mg total) by mouth daily as needed for edema. 02/18/19   Vanessa Kick, MD  ibuprofen (ADVIL,MOTRIN) 200 MG tablet Take 800 mg by mouth every 6 (six) hours as needed for headache or moderate pain.    [provider]  losartan-hydrochlorothiazide (HYZAAR) 100-25 MG tablet Take 1 tablet by mouth daily. 06/06/18   Raylene Everts, MD  omeprazole (PRILOSEC) 40 MG capsule Take 1 capsule (40 mg total) by mouth daily. 06/06/18   Raylene Everts, MD  ondansetron (ZOFRAN ODT) 4 MG disintegrating tablet Take 1 tablet (4 mg total) by mouth every 8 (eight) hours as needed for nausea or vomiting. 11/09/17   Antonietta Breach, PA-C  venlafaxine XR (EFFEXOR-XR) 150 MG 24 hr capsule Take 1 capsule (150 mg total) by mouth daily with breakfast. 06/06/18   Raylene Everts, MD     Family History Family History  Problem Relation Age of Onset  . Hypertension Mother   . Mental illness Mother        depression; alot of mental illness.  . Cancer Father 30       Colon cancer age 74  . Thyroid disease Maternal Grandmother   . Diabetes Paternal Grandfather   . Anesthesia problems Neg Hx   . Other Neg Hx     Social History Social History   Tobacco Use  . Smoking status: Current Every Day Smoker    Packs/day: 0.50    Years: 20.00    Pack years: 10.00    Types: Cigarettes    Start date: 05/04/2014  . Smokeless tobacco: Never Used  . Tobacco comment: using the vapor only  Substance Use Topics  . Alcohol use: No    Comment: sober since 01/22/11  . Drug use: No    Types: Cocaine, IV    Comment: not currently using     Allergies   Vicodin [hydrocodone-acetaminophen]   Review of Systems Review of Systems   Physical Exam Triage Vital Signs ED Triage Vitals  Enc Vitals Group     BP 02/23/19 1035 124/80     Pulse Rate 02/23/19 1035 89     Resp 02/23/19 1035 18     Temp 02/23/19 1035 97.8 F (36.6 C)     Temp Source 02/23/19 1035 Oral     SpO2 02/23/19 1035 98 %     Weight 02/23/19 1042 170 lb (77.1 kg)     Height --      Head Circumference --      Peak Flow --      Pain Score 02/23/19 1042 0     Pain Loc --      Pain Edu? --      Excl. in [place]? --    No data found.  Updated Vital Signs BP 124/80 (BP Location: Right Arm)   Pulse 89   Temp 97.8 F (36.6 C) (Oral)   Resp 18   Wt 170 lb (77.1 kg)   LMP 01/30/2019   SpO2 98%   BMI 33.20 kg/m   Visual Acuity Right Eye Distance:   Left Eye Distance:   Bilateral Distance:    Right Eye Near:   Left Eye Near:    Bilateral Near:     Physical Exam Vitals signs and nursing note reviewed.  Constitutional:      Appearance: Normal appearance.  HENT:  Head: Normocephalic and atraumatic.     Nose: Nose normal.  Eyes:     Conjunctiva/sclera: Conjunctivae normal.  Neck:      Musculoskeletal: Normal range of motion.  Pulmonary:     Effort: Pulmonary effort is normal.  Musculoskeletal: Normal range of motion.        General: No swelling, tenderness, deformity or signs of injury.     Right lower leg: No edema.     Left lower leg: No edema.  Skin:    General: Skin is warm and dry.  Neurological:     Mental Status: She is alert.  Psychiatric:        Mood and Affect: Mood normal.      UC Treatments / Results  Labs (all labs ordered are listed, but only abnormal results are displayed) Labs Reviewed  POCT URINALYSIS DIP (DEVICE)    EKG None  Radiology No results found.  Procedures Procedures (including critical care time)  Medications Ordered in UC Medications - No data to display  Initial Impression / Assessment and Plan / UC Course  I have reviewed the triage vital signs and the nursing notes.  Pertinent labs & imaging results that were available during my care of the patient were reviewed by me and considered in my medical decision making (see chart for details).    LE edema and suprapubic pressure.   Pt LE edema has improved some with the lasix. She has not yet purchased the compression stockings.  Recommended that she go ahead and purchase those and start using them to help with the lower extremity edema She is still having suprapubic pressure which is somewhat relieved with AZO. Her urine is completely normal here. No signs of infection Reassured that most likely she still having some residual inflammation of the bladder from the previous infection and that this should resolve over time Recommended that she increase her fluids and make sure she is staying hydrated Otherwise her vital signs are stable here and she is nontoxic or ill-appearing Patient understanding and agree to plan Final Clinical Impressions(s) / UC Diagnoses   Final diagnoses:  Leg edema  Suprapubic pressure     Discharge Instructions     Wear compression  stockings for the swelling Elevate the legs Use the lasix as needed Urine was negative for infection.  Follow up as needed for continued or worsening symptoms     ED Prescriptions    None     Controlled Substance Prescriptions [place] Controlled Substance Registry consulted? Not Applicable   Orvan July, NP 02/23/19 1144

## 2019-06-10 ENCOUNTER — Other Ambulatory Visit: Payer: Self-pay

## 2019-06-10 ENCOUNTER — Encounter (HOSPITAL_COMMUNITY): Payer: Self-pay

## 2019-06-10 ENCOUNTER — Ambulatory Visit (HOSPITAL_COMMUNITY)
Admission: EM | Admit: 2019-06-10 | Discharge: 2019-06-10 | Disposition: A | Discharge status: Home / Self Care | Payer: Self-pay | Attending: Family Medicine | Admitting: Family Medicine

## 2019-06-10 DIAGNOSIS — N3090 Cystitis, unspecified without hematuria: Secondary | ICD-10-CM | POA: Insufficient documentation

## 2019-06-10 DIAGNOSIS — I10 Essential (primary) hypertension: Secondary | ICD-10-CM | POA: Insufficient documentation

## 2019-06-10 LAB — POCT URINALYSIS DIP (DEVICE)
Glucose, UA: NEGATIVE mg/dL
Nitrite: NEGATIVE
Protein, ur: >=300 mg/dL — AB
Specific Gravity, Urine: >=1.03 (ref 1.005–1.030)
Urobilinogen, UA: 0.2 mg/dL (ref 0.0–1.0)
pH: 6 (ref 5.0–8.0)

## 2019-06-10 MED ORDER — CEPHALEXIN 500 MG PO CAPS: 500.0000 mg | ORAL_CAPSULE | Freq: Two times a day (BID) | ORAL | 0 refills | Status: DC

## 2019-06-10 MED ORDER — LOSARTAN POTASSIUM-HCTZ 100-25 MG PO TABS: 1.0000 | ORAL_TABLET | Freq: Every day | ORAL | 0 refills | Status: DC

## 2019-06-10 NOTE — ED Provider Notes (Signed)
[place]    ASSESSMENT & PLAN:  1. Cystitis   2. Essential hypertension     Meds ordered this encounter  Medications  . cephALEXin (KEFLEX) 500 MG capsule    Sig: Take 1 capsule (500 mg total) by mouth 2 (two) times daily.    Dispense:  14 capsule    Refill:  0  . losartan-hydrochlorothiazide (HYZAAR) 100-25 MG tablet    Sig: Take 1 tablet by mouth daily.    Dispense:  30 tablet    Refill:  0    No signs of pyelonephritis. Discussed. Refill of HTN medication given at her request. Urine culture sent. Will notify patient when results available. Will follow up with her PCP or here if not showing improvement over the next 48 hours, sooner if needed.   Outlined signs and symptoms indicating need for more acute intervention. Patient verbalized understanding. After Visit Summary given.  SUBJECTIVE:  [name] is a 43 y.o. female who complains of urinary frequency, urgency and dysuria for the past few weeks; 'kind of comes and goes'. Without associated flank pain, fever, chills, genitourinary discharge or gross bleeding. Gross hematuria: not present. Normal PO intake. Without specific abdominal pain. No self treatment reported. Ambulatory without difficulty. 'Urine also looks dark and has a strong smell.'  LMP: Patient's last menstrual period was 06/08/2019.  Increased blood pressure noted today. Reports that she is being treated for hypertension; has not taken medication today; requests refill.  She reports no chest pain on exertion, no dyspnea on exertion, no swelling of ankles, no orthostatic dizziness or lightheadedness, no orthopnea or paroxysmal nocturnal dyspnea, no palpitations and no intermittent claudication symptoms.  ROS: As in HPI. All other systems negative.   OBJECTIVE:  Vitals:   06/10/19 1529  BP: (!) 152/117  Pulse: 94  Resp: 16  Temp: 99.1 F (37.3 C)  TempSrc: Skin  SpO2: 97%   General appearance: alert; no distress HENT:  oropharynx: moist Lungs: unlabored respirations Abdomen: soft, non-tender; bowel sounds normal; no masses or organomegaly; no guarding or rebound tenderness Back: no CVA tenderness Extremities: no edema; symmetrical with no gross deformities Skin: warm and dry Neurologic: normal gait Psychological: alert and cooperative; normal mood and affect  Labs Reviewed  POCT URINALYSIS DIP (DEVICE) - Abnormal; Notable for the following components:      Result Value   Bilirubin Urine SMALL (*)    Ketones, ur TRACE (*)    Hgb urine dipstick LARGE (*)    Protein, ur >=300 (*)    Leukocytes,Ua TRACE (*)    All other components within normal limits  URINE CULTURE    Allergies  Allergen Reactions  . Vicodin [Hydrocodone-Acetaminophen] Nausea And Vomiting    Past Medical History:  Diagnosis Date  . Abnormal Pap smear 1998   had colpo  . Anxiety   . Asthma    ,as child only   no inhaler  . Bicornate uterus   . Bicornuate uterus   . Chlamydia 2000  . GERD (gastroesophageal reflux disease)   . HCV infection    HX of Hep C  . Headache(784.0)   . Hypertension 2010  . IUP (intrauterine pregnancy), incidental 13 weeks  . Opiate dependence ([place])    stopped methadone 01/2011  . PONV (postoperative nausea and vomiting)   . STD (female)    HX of STD's   Social History   Socioeconomic History  . Marital status: Single    Spouse name: Not on file  .  Number of children: Not on file  . Years of education: Not on file  . Highest education level: Not on file  Occupational History  . Not on file  Social Needs  . Financial resource strain: Not on file  . Food insecurity    Worry: Not on file    Inability: Not on file  . Transportation needs    Medical: Not on file    Non-medical: Not on file  Tobacco Use  . Smoking status: Current Every Day Smoker    Packs/day: 0.50    Years: 20.00    Pack years: 10.00    Types: Cigarettes    Start date: 05/04/2014  . Smokeless tobacco: Never Used   . Tobacco comment: using the vapor only  Substance and Sexual Activity  . Alcohol use: No    Comment: sober since 01/22/11  . Drug use: No    Types: Cocaine, IV    Comment: not currently using  . Sexual activity: Yes    Birth control/protection: Surgical  Lifestyle  . Physical activity    Days per week: Not on file    Minutes per session: Not on file  . Stress: Not on file  Relationships  . Social Herbalist on phone: Not on file    Gets together: Not on file    Attends religious service: Not on file    Active member of club or organization: Not on file    Attends meetings of clubs or organizations: Not on file    Relationship status: Not on file  . Intimate partner violence    Fear of current or ex partner: Not on file    Emotionally abused: Not on file    Physically abused: Not on file    Forced sexual activity: Not on file  Other Topics Concern  . Not on file  Social History Narrative   Marital status: single; dating seriously x 3 years in 2016; males only.     Children: 2 children; no grandchildren.     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      Drugs:  Clean 02/14/2013; attends NA every day; sponsor.        Sexual activity:  Total partners = 150.  +chlamydia in 1998.        Seatbelt: 100%      Guns:     Family History  Problem Relation Age of Onset  . Hypertension Mother   . Mental illness Mother        depression; alot of mental illness.  . Cancer Father 46       Colon cancer age 4  . Thyroid disease Maternal Grandmother   . Diabetes Paternal Grandfather   . Anesthesia problems Neg Hx   . Other Neg Hx        Vanessa Kick, MD 06/12/19 [phone]

## 2019-06-10 NOTE — ED Triage Notes (Signed)
Pt present lower back pain with some urgency to urinate. Symptoms been going on for over a month. Pt states her urine is a dark color with odor.

## 2019-06-12 LAB — URINE CULTURE: Culture: >=100000 — AB

## 2019-06-14 ENCOUNTER — Telehealth: Payer: Self-pay | Admitting: Emergency Medicine

## 2019-06-14 NOTE — Telephone Encounter (Signed)
Pt on keflex, called and spoke to patient about how she is feeling, pt states most of her UTI symptoms are resolving, pt still has several days left of medicine, pt states "my symptoms are mostly gone". Pt told that if she is still having symptoms at all after the 7 days, to give [place] a call back, otherwise continue treatment. Pt agreeable to plan.

## 2019-06-16 DIAGNOSIS — F1210 Cannabis abuse, uncomplicated: Secondary | ICD-10-CM | POA: Insufficient documentation

## 2019-06-16 DIAGNOSIS — F17200 Nicotine dependence, unspecified, uncomplicated: Secondary | ICD-10-CM | POA: Insufficient documentation

## 2019-07-11 ENCOUNTER — Ambulatory Visit (HOSPITAL_COMMUNITY)
Admission: EM | Admit: 2019-07-11 | Discharge: 2019-07-11 | Disposition: A | Discharge status: Home / Self Care | Payer: Self-pay | Attending: Family Medicine | Admitting: Family Medicine

## 2019-07-11 ENCOUNTER — Encounter (HOSPITAL_COMMUNITY): Payer: Self-pay | Admitting: Emergency Medicine

## 2019-07-11 ENCOUNTER — Other Ambulatory Visit: Payer: Self-pay

## 2019-07-11 DIAGNOSIS — L02412 Cutaneous abscess of left axilla: Secondary | ICD-10-CM | POA: Insufficient documentation

## 2019-07-11 DIAGNOSIS — N390 Urinary tract infection, site not specified: Secondary | ICD-10-CM | POA: Insufficient documentation

## 2019-07-11 LAB — POCT URINALYSIS DIP (DEVICE)
Bilirubin Urine: NEGATIVE
Glucose, UA: NEGATIVE mg/dL
Ketones, ur: NEGATIVE mg/dL
Nitrite: NEGATIVE
Protein, ur: 100 mg/dL — AB
Specific Gravity, Urine: 1.02 (ref 1.005–1.030)
Urobilinogen, UA: 0.2 mg/dL (ref 0.0–1.0)
pH: 7 (ref 5.0–8.0)

## 2019-07-11 MED ORDER — CIPROFLOXACIN HCL 250 MG PO TABS: 250.0000 mg | ORAL_TABLET | Freq: Two times a day (BID) | ORAL | 1 refills | Status: AC

## 2019-07-11 MED ORDER — FLUCONAZOLE 150 MG PO TABS: 150.0000 mg | ORAL_TABLET | Freq: Every day | ORAL | 0 refills | Status: DC

## 2019-07-11 NOTE — Discharge Instructions (Signed)
Drink plenty of water Take antibiotic 2 times a day Refill if needed I have given you a printed prescription for Diflucan in case you need this Follow-up as needed

## 2019-07-11 NOTE — ED Provider Notes (Signed)
[place]    CSN: [id number] Arrival date & time: 07/11/19  1113      History   Chief Complaint Chief Complaint  Patient presents with  . Dysuria  . Abscess    HPI [name] is a 43 y.o. female.   HPI  Recurring UTIs.  She has had for the last couple of months.  She is concerned that she is getting more bladder infections than she previously did.  I discussed with her causes of bladder infection, bathing, soaps, sexual activity, not enough fluid intake, anatomic variants, menopause, stones.  Patient is a CNA.  I did look at her last urine culture.  It was a fairly resistant E. coli.  She was treated with Keflex.  She is not sure that this completely got rid of the infection.  I will treat her with Cipro, even though it has a black box warning, because it is the only drug that appears able to eliminate this bacteria.  She also has some skin infections.  Bumps in both axilla.  They are not abscesses but are painful skin infections.  Past Medical History:  Diagnosis Date  . Abnormal Pap smear 1998   had colpo  . Anxiety   . Asthma    ,as child only   no inhaler  . Bicornate uterus   . Bicornuate uterus   . Chlamydia 2000  . GERD (gastroesophageal reflux disease)   . HCV infection    HX of Hep C  . Headache(784.0)   . Hypertension 2010  . IUP (intrauterine pregnancy), incidental 13 weeks  . Opiate dependence ([place])    stopped methadone 01/2011  . PONV (postoperative nausea and vomiting)   . STD (female)    HX of STD's    Patient Active Problem List   Diagnosis Date Noted  . Gastroesophageal reflux disease 03/08/2017  . Anxiety 03/08/2017  . Anxiety and depression 10/10/2015  . History of intravenous drug use in remission 09/07/2011  . HCV infection   . Essential hypertension   . Bipolar disorder ([place])   . Bicornuate uterus     Past Surgical History:  Procedure Laterality Date  . BILATERAL SALPINGECTOMY  12/06/2012   Procedure: BILATERAL  SALPINGECTOMY;  Surgeon: Mora Bellman, MD;  Location: [facility];  Service: Gynecology;  Laterality: Bilateral;  laparoscopic  . CERVICAL CERCLAGE  09/01/2011   Procedure: CERCLAGE CERVICAL;  Surgeon: Donnamae Jude, MD;  Location: [facility];  Service: Gynecology;  Laterality: N/A;  . CERVICAL CERCLAGE  06/28/2012   Procedure: CERCLAGE CERVICAL;  Surgeon: Donnamae Jude, MD;  Location: [facility];  Service: Gynecology;  Laterality: N/A;  . DILATION AND CURETTAGE OF UTERUS     SAB  . FINGER SURGERY  2003   reattachment of right forefinger  . THERAPEUTIC ABORTION    . THERAPEUTIC ABORTION    . TONSILLECTOMY    . TUBAL LIGATION  11/23/2012    OB History    Gravida  7   Para  4   Term  2   Preterm  2   AB  3   Living  2     SAB  1   TAB  2   Ectopic  0   Multiple  0   Live Births  3            Home Medications    Prior to Admission medications   Medication Sig Start Date End Date Taking? Authorizing Provider  acetaminophen (TYLENOL) 500  MG tablet Take 1,000 mg by mouth every 6 (six) hours as needed for moderate pain or headache.    [provider]  ciprofloxacin (CIPRO) 250 MG tablet Take 1 tablet (250 mg total) by mouth 2 (two) times daily for 7 days. 07/11/19 07/18/19  Raylene Everts, MD  fluconazole (DIFLUCAN) 150 MG tablet Take 1 tablet (150 mg total) by mouth daily. Repeat in 1 week if needed 07/11/19   Raylene Everts, MD  furosemide (LASIX) 20 MG tablet Take 1 tablet (20 mg total) by mouth daily as needed for edema. 02/18/19   Vanessa Kick, MD  ibuprofen (ADVIL,MOTRIN) 200 MG tablet Take 800 mg by mouth every 6 (six) hours as needed for headache or moderate pain.    [provider]  losartan-hydrochlorothiazide (HYZAAR) 100-25 MG tablet Take 1 tablet by mouth daily. 06/10/19   Vanessa Kick, MD  omeprazole (PRILOSEC) 40 MG capsule Take 1 capsule (40 mg total) by mouth daily. 06/06/18   Raylene Everts, MD  ondansetron (ZOFRAN ODT) 4 MG disintegrating  tablet Take 1 tablet (4 mg total) by mouth every 8 (eight) hours as needed for nausea or vomiting. 11/09/17   Antonietta Breach, PA-C  venlafaxine XR (EFFEXOR-XR) 150 MG 24 hr capsule Take 1 capsule (150 mg total) by mouth daily with breakfast. 06/06/18   Raylene Everts, MD    Family History Family History  Problem Relation Age of Onset  . Hypertension Mother   . Mental illness Mother        depression; alot of mental illness.  . Cancer Father 63       Colon cancer age 46  . Thyroid disease Maternal Grandmother   . Diabetes Paternal Grandfather   . Anesthesia problems Neg Hx   . Other Neg Hx     Social History Social History   Tobacco Use  . Smoking status: Current Every Day Smoker    Packs/day: 0.50    Years: 20.00    Pack years: 10.00    Types: Cigarettes    Start date: 05/04/2014  . Smokeless tobacco: Never Used  . Tobacco comment: using the vapor only  Substance Use Topics  . Alcohol use: No    Comment: sober since 01/22/11  . Drug use: No    Types: Cocaine, IV    Comment: not currently using     Allergies   Vicodin [hydrocodone-acetaminophen]   Review of Systems Review of Systems  Constitutional: Negative for chills and fever.  HENT: Negative for ear pain and sore throat.   Eyes: Negative for pain and visual disturbance.  Respiratory: Negative for cough and shortness of breath.   Cardiovascular: Negative for chest pain and palpitations.  Gastrointestinal: Negative for abdominal pain and vomiting.  Genitourinary: Positive for dysuria, frequency and urgency. Negative for hematuria.  Musculoskeletal: Negative for arthralgias and back pain.  Skin: Positive for wound. Negative for color change and rash.       Skin infection in axilla  Neurological: Negative for seizures and syncope.  All other systems reviewed and are negative.    Physical Exam Triage Vital Signs ED Triage Vitals  Enc Vitals Group     BP 07/11/19 1129 (!) 147/97     Pulse Rate 07/11/19 1129  (!) 101     Resp 07/11/19 1129 18     Temp 07/11/19 1129 97.9 F (36.6 C)     Temp Source 07/11/19 1129 Oral     SpO2 07/11/19 1129 100 %  Weight --      Height --      Head Circumference --      Peak Flow --      Pain Score 07/11/19 1130 8     Pain Loc --      Pain Edu? --      Excl. in [place]? --    No data found.  Updated Vital Signs BP (!) 147/97 (BP Location: Left Arm)   Pulse (!) 101   Temp 97.9 F (36.6 C) (Oral)   Resp 18   SpO2 100%      Physical Exam Constitutional:      General: She is not in acute distress.    Appearance: She is well-developed.  HENT:     Head: Normocephalic and atraumatic.  Eyes:     Conjunctiva/sclera: Conjunctivae normal.     Pupils: Pupils are equal, round, and reactive to light.  Neck:     Musculoskeletal: Normal range of motion.  Cardiovascular:     Rate and Rhythm: Normal rate and regular rhythm.     Heart sounds: Normal heart sounds.  Pulmonary:     Effort: Pulmonary effort is normal. No respiratory distress.  Abdominal:     General: There is no distension.     Palpations: Abdomen is soft.     Tenderness: There is no right CVA tenderness or left CVA tenderness.  Musculoskeletal: Normal range of motion.  Skin:    General: Skin is warm and dry.     Comments: Left axilla has one 1 cm erythematous nodule that is very tender, right axilla has 3 separate nodules.  Neurological:     Mental Status: She is alert.      UC Treatments / Results  Labs (all labs ordered are listed, but only abnormal results are displayed) Labs Reviewed  POCT URINALYSIS DIP (DEVICE) - Abnormal; Notable for the following components:      Result Value   Hgb urine dipstick SMALL (*)    Protein, ur 100 (*)    Leukocytes,Ua SMALL (*)    All other components within normal limits  URINE CULTURE    EKG   Radiology No results found.  Procedures Procedures (including critical care time)  Medications Ordered in UC Medications - No data to  display  Initial Impression / Assessment and Plan / UC Course  I have reviewed the triage vital signs and the nursing notes.  Pertinent labs & imaging results that were available during my care of the patient were reviewed by me and considered in my medical decision making (see chart for details).  Clinical Course as of Jul 11 1327  Tue Jul 11, 2019  1158 POCT urinalysis dip (device) [YN]    Clinical Course User Index [YN] Raylene Everts, MD     Final Clinical Impressions(s) / UC Diagnoses   Final diagnoses:  Abscess of left axilla  Lower urinary tract infectious disease     Discharge Instructions     Drink plenty of water Take antibiotic 2 times a day Refill if needed I have given you a printed prescription for Diflucan in case you need this Follow-up as needed    ED Prescriptions    Medication Sig Dispense Auth. Provider   ciprofloxacin (CIPRO) 250 MG tablet Take 1 tablet (250 mg total) by mouth 2 (two) times daily for 7 days. 14 tablet Raylene Everts, MD   fluconazole (DIFLUCAN) 150 MG tablet Take 1 tablet (150 mg total) by mouth daily. Repeat  in 1 week if needed 2 tablet Raylene Everts, MD     Controlled Substance Prescriptions [place] Controlled Substance Registry consulted? Not Applicable   Raylene Everts, MD 07/11/19 1331

## 2019-07-11 NOTE — ED Triage Notes (Signed)
Pt sts dysuria and frequency; pt sts hx of same; pt sts small abscess in axillary areas

## 2019-07-13 ENCOUNTER — Telehealth (HOSPITAL_COMMUNITY): Payer: Self-pay | Admitting: Emergency Medicine

## 2019-07-13 LAB — URINE CULTURE: Culture: >=100000 — AB

## 2019-07-13 NOTE — Telephone Encounter (Signed)
Urine culture was positive for e coli and was given cipro  at urgent care visit. Pt contacted and made aware, educated on completing antibiotic and to follow up if symptoms are persistent. Verbalized understanding.

## 2020-01-02 ENCOUNTER — Ambulatory Visit (HOSPITAL_COMMUNITY)
Admission: EM | Admit: 2020-01-02 | Discharge: 2020-01-02 | Disposition: A | Discharge status: Home / Self Care | Payer: Self-pay | Attending: Emergency Medicine | Admitting: Emergency Medicine

## 2020-01-02 ENCOUNTER — Encounter (HOSPITAL_COMMUNITY): Payer: Self-pay

## 2020-01-02 ENCOUNTER — Other Ambulatory Visit: Payer: Self-pay

## 2020-01-02 DIAGNOSIS — R519 Headache, unspecified: Secondary | ICD-10-CM

## 2020-01-02 DIAGNOSIS — I10 Essential (primary) hypertension: Secondary | ICD-10-CM

## 2020-01-02 MED ORDER — AMLODIPINE BESYLATE 10 MG PO TABS: 10.0000 mg | ORAL_TABLET | Freq: Every day | ORAL | 0 refills | Status: DC

## 2020-01-02 NOTE — Discharge Instructions (Signed)
Your blood pressure was elevated today in clinic. Please be sure to take blood pressure medications as prescribed. Please monitor your blood pressure at home or when you go to a [facility]. Please follow up with your primary care doctor to recheck blood pressure and discuss any need for medication changes.  ° °Please go to Emergency Room if you start to experience severe headache, vision changes, decreased urine production, chest pain, shortness of breath, speech slurring, one sided weakness. °

## 2020-01-02 NOTE — ED Provider Notes (Signed)
[place]    CSN: [id number] Arrival date & time: 01/02/20  1416      History   Chief Complaint Chief Complaint  Patient presents with  . Hypertension    HPI [name] is a 44 y.o. female history of anxiety, GERD, hypertension, bipolar disorder presenting today for evaluation of elevated blood pressure.  Patient has been off of her blood pressure medicine for approximately 1 week.  As the week has progressed she has had worsening and frequent headaches.  Occasionally reporting spots, denies any blurry or double vision.  She also has had some occasional shortness of breath and chest pressure.  Denies currently.  Also reporting contracting of hand at times and difficulty moving.  Denies associated swelling.  Patient is on amlodipine 10 mg daily.  When she is on this medicine her blood pressure typically runs [id number] systolic and 0000000 diastolic.  She was previously seeing community health in [facility], but has been unable to follow-up due to financial reasons.  Approximately half pack per day tobacco use.  HPI  Past Medical History:  Diagnosis Date  . Abnormal Pap smear 1998   had colpo  . Anxiety   . Asthma    ,as child only   no inhaler  . Bicornate uterus   . Bicornuate uterus   . Chlamydia 2000  . GERD (gastroesophageal reflux disease)   . HCV infection    HX of Hep C  . Headache(784.0)   . Hypertension 2010  . IUP (intrauterine pregnancy), incidental 13 weeks  . Opiate dependence ([place])    stopped methadone 01/2011  . PONV (postoperative nausea and vomiting)   . STD (female)    HX of STD's    Patient Active Problem List   Diagnosis Date Noted  . Gastroesophageal reflux disease 03/08/2017  . Anxiety 03/08/2017  . Anxiety and depression 10/10/2015  . History of intravenous drug use in remission 09/07/2011  . HCV infection   . Essential hypertension   . Bipolar disorder ([place])   . Bicornuate uterus     Past Surgical History:  Procedure Laterality  Date  . BILATERAL SALPINGECTOMY  12/06/2012   Procedure: BILATERAL SALPINGECTOMY;  Surgeon: Mora Bellman, MD;  Location: [facility];  Service: Gynecology;  Laterality: Bilateral;  laparoscopic  . CERVICAL CERCLAGE  09/01/2011   Procedure: CERCLAGE CERVICAL;  Surgeon: Donnamae Jude, MD;  Location: [facility];  Service: Gynecology;  Laterality: N/A;  . CERVICAL CERCLAGE  06/28/2012   Procedure: CERCLAGE CERVICAL;  Surgeon: Donnamae Jude, MD;  Location: [facility];  Service: Gynecology;  Laterality: N/A;  . DILATION AND CURETTAGE OF UTERUS     SAB  . FINGER SURGERY  2003   reattachment of right forefinger  . THERAPEUTIC ABORTION    . THERAPEUTIC ABORTION    . TONSILLECTOMY    . TUBAL LIGATION  11/23/2012    OB History    Gravida  7   Para  4   Term  2   Preterm  2   AB  3   Living  2     SAB  1   TAB  2   Ectopic  0   Multiple  0   Live Births  3            Home Medications    Prior to Admission medications   Medication Sig Start Date End Date Taking? Authorizing Provider  acetaminophen (TYLENOL) 500 MG tablet Take 1,000 mg by mouth every  6 (six) hours as needed for moderate pain or headache.    [provider]  amLODipine (NORVASC) 10 MG tablet Take 1 tablet (10 mg total) by mouth daily. 01/02/20   Dabid [place] C, PA-C  furosemide (LASIX) 20 MG tablet Take 1 tablet (20 mg total) by mouth daily as needed for edema. 02/18/19   Vanessa Kick, MD  ibuprofen (ADVIL,MOTRIN) 200 MG tablet Take 800 mg by mouth every 6 (six) hours as needed for headache or moderate pain.    [provider]  venlafaxine XR (EFFEXOR-XR) 150 MG 24 hr capsule Take 1 capsule (150 mg total) by mouth daily with breakfast. 06/06/18   Raylene Everts, MD  losartan-hydrochlorothiazide (HYZAAR) 100-25 MG tablet Take 1 tablet by mouth daily. 06/10/19 01/02/20  Vanessa Kick, MD  omeprazole (PRILOSEC) 40 MG capsule Take 1 capsule (40 mg total) by mouth daily. 06/06/18 01/02/20  Raylene Everts, MD     Family History Family History  Problem Relation Age of Onset  . Hypertension Mother   . Mental illness Mother        depression; alot of mental illness.  . Cancer Father 64       Colon cancer age 84  . Thyroid disease Maternal Grandmother   . Diabetes Paternal Grandfather   . Anesthesia problems Neg Hx   . Other Neg Hx     Social History Social History   Tobacco Use  . Smoking status: Current Every Day Smoker    Packs/day: 0.50    Years: 20.00    Pack years: 10.00    Types: Cigarettes    Start date: 05/04/2014  . Smokeless tobacco: Never Used  . Tobacco comment: using the vapor only  Substance Use Topics  . Alcohol use: Yes    Comment: rarely  . Drug use: No    Types: Cocaine, IV    Comment: not currently using     Allergies   Vicodin [hydrocodone-acetaminophen]   Review of Systems Review of Systems  Constitutional: Negative for fatigue and fever.  HENT: Negative for congestion, sinus pressure and sore throat.   Eyes: Positive for photophobia and visual disturbance. Negative for pain.  Respiratory: Positive for shortness of breath. Negative for cough.   Cardiovascular: Negative for chest pain.  Gastrointestinal: Negative for abdominal pain, nausea and vomiting.  Genitourinary: Negative for decreased urine volume and hematuria.  Musculoskeletal: Negative for myalgias, neck pain and neck stiffness.  Neurological: Positive for headaches. Negative for dizziness, syncope, facial asymmetry, speech difficulty, weakness, light-headedness and numbness.     Physical Exam Triage Vital Signs ED Triage Vitals  Enc Vitals Group     BP 01/02/20 1448 (!) 182/116     Pulse Rate 01/02/20 1448 100     Resp 01/02/20 1448 (!) 24     Temp 01/02/20 1448 98.2 F (36.8 C)     Temp Source 01/02/20 1448 Oral     SpO2 01/02/20 1448 100 %     Weight --      Height --      Head Circumference --      Peak Flow --      Pain Score 01/02/20 1444 0     Pain Loc --      Pain  Edu? --      Excl. in [place]? --    No data found.  Updated Vital Signs BP (!) 182/116 (BP Location: Right Arm)   Pulse 100   Temp 98.2 F (36.8 C) (Oral)  Resp (!) 24   SpO2 100%   Visual Acuity Right Eye Distance:   Left Eye Distance:   Bilateral Distance:    Right Eye Near:   Left Eye Near:    Bilateral Near:     Physical Exam Vitals and nursing note reviewed.  Constitutional:      General: She is not in acute distress.    Appearance: She is well-developed.  HENT:     Head: Normocephalic and atraumatic.     Ears:     Comments: Bilateral ears without tenderness to palpation of external auricle, tragus and mastoid, EAC's without erythema or swelling, TM's with good bony landmarks and cone of light. Non erythematous.     Mouth/Throat:     Comments: Oral mucosa pink and moist, no tonsillar enlargement or exudate. Posterior pharynx patent and nonerythematous, no uvula deviation or swelling. Normal phonation. Palate elevates symmetrically Eyes:     Conjunctiva/sclera: Conjunctivae normal.  Cardiovascular:     Rate and Rhythm: Normal rate and regular rhythm.     Heart sounds: No murmur.  Pulmonary:     Effort: Pulmonary effort is normal. No respiratory distress.     Breath sounds: Normal breath sounds.     Comments: Breathing comfortably at rest, CTABL, no wheezing, rales or other adventitious sounds auscultated Abdominal:     Palpations: Abdomen is soft.     Tenderness: There is no abdominal tenderness.  Musculoskeletal:     Cervical back: Neck supple.  Skin:    General: Skin is warm and dry.  Neurological:     General: No focal deficit present.     Mental Status: She is alert and oriented to person, place, and time. Mental status is at baseline.     Comments: Patient A&O x3, cranial nerves II-XII grossly intact, strength at shoulders, hips and knees 5/5, equal bilaterally, patellar reflex 2+ bilaterally.  Gait without abnormality.      UC Treatments / Results   Labs (all labs ordered are listed, but only abnormal results are displayed) Labs Reviewed - No data to display  EKG   Radiology No results found.  Procedures Procedures (including critical care time)  Medications Ordered in UC Medications - No data to display  Initial Impression / Assessment and Plan / UC Course  I have reviewed the triage vital signs and the nursing notes.  Pertinent labs & imaging results that were available during my care of the patient were reviewed by me and considered in my medical decision making (see chart for details).    Blood pressure rechecked and was 176/110.  Currently without chest pain or shortness of breath.  No neuro deficits on exam.  Will refill amlodipine today to restart.  Monitor for resolution of symptoms.  Reestablish care with primary care, provided contacts.  Discussed warning signs and red flags to follow-up in emergency room for.  Discussed strict return precautions. Patient verbalized understanding and is agreeable with plan.   Final Clinical Impressions(s) / UC Diagnoses   Final diagnoses:  Essential hypertension  Acute nonintractable headache, unspecified headache type     Discharge Instructions     Your blood pressure was elevated today in clinic. Please be sure to take blood pressure medications as prescribed. Please monitor your blood pressure at home or when you go to a [facility]. Please follow up with your primary care doctor to recheck blood pressure and discuss any need for medication changes.   Please go to Emergency Room if you start to  experience severe headache, vision changes, decreased urine production, chest pain, shortness of breath, speech slurring, one sided weakness.   ED Prescriptions    Medication Sig Dispense Auth. Provider   amLODipine (NORVASC) 10 MG tablet Take 1 tablet (10 mg total) by mouth daily. 60 tablet Shamina Etheridge, [place] C, PA-C     PDMP not reviewed this encounter.   Janith Lima, [place] 01/02/20 1541

## 2020-01-02 NOTE — ED Triage Notes (Signed)
Patient presents to Urgent Care with complaints of htn since running out of her medication a week ago. Patient reports she has sob, headaches, chest pressure, and anxiety; also feels like her fingers are locking up on her.

## 2020-01-10 ENCOUNTER — Ambulatory Visit (HOSPITAL_COMMUNITY)
Admission: RE | Admit: 2020-01-10 | Discharge: 2020-01-10 | Disposition: A | Discharge status: Home / Self Care | Payer: Self-pay | Attending: Psychiatry | Admitting: Psychiatry

## 2020-01-10 DIAGNOSIS — F1910 Other psychoactive substance abuse, uncomplicated: Secondary | ICD-10-CM | POA: Insufficient documentation

## 2020-01-10 DIAGNOSIS — F4323 Adjustment disorder with mixed anxiety and depressed mood: Secondary | ICD-10-CM
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
## 2020-01-19 ENCOUNTER — Inpatient Hospital Stay: Payer: Medicaid Other | Admitting: Internal Medicine

## 2020-02-29 ENCOUNTER — Inpatient Hospital Stay: Payer: Self-pay | Admitting: Internal Medicine

## 2020-03-10 ENCOUNTER — Encounter (HOSPITAL_COMMUNITY): Payer: Self-pay | Admitting: Family Medicine

## 2020-03-10 ENCOUNTER — Other Ambulatory Visit: Payer: Self-pay

## 2020-03-10 ENCOUNTER — Ambulatory Visit (HOSPITAL_COMMUNITY)
Admission: EM | Admit: 2020-03-10 | Discharge: 2020-03-10 | Disposition: A | Discharge status: Home / Self Care | Payer: Medicaid Other | Attending: Family Medicine | Admitting: Family Medicine

## 2020-03-10 DIAGNOSIS — K219 Gastro-esophageal reflux disease without esophagitis: Secondary | ICD-10-CM

## 2020-03-10 DIAGNOSIS — I10 Essential (primary) hypertension: Secondary | ICD-10-CM

## 2020-03-10 MED ORDER — OMEPRAZOLE 20 MG PO CPDR: 20.0000 mg | DELAYED_RELEASE_CAPSULE | Freq: Every day | ORAL | 3 refills | Status: DC

## 2020-03-10 MED ORDER — AMLODIPINE BESYLATE 10 MG PO TABS: 10.0000 mg | ORAL_TABLET | Freq: Every day | ORAL | 3 refills | Status: DC

## 2020-03-10 NOTE — ED Triage Notes (Signed)
Pt is in need of a medication refill and a medical clearance to go to a treatment center. Pt took her last pill yesterday that why her BP is elevated.

## 2020-03-10 NOTE — ED Provider Notes (Signed)
[place]    CSN: [id number] Arrival date & time: 03/10/20  1444      History   Chief Complaint Chief Complaint  Patient presents with  . Medication Refill    HPI [name] is a 44 y.o. female.   [facility]ablished [facility] patient [facility].  Pt is in need of a medication refill and a medical clearance to go to a treatment center. Pt took her last pill yesterday that why her BP is elevated.   Patient is about to enter treatment plan, inpatient for 30 days with possible extension, for abuse of methamphetamine.  She has been abusing medication for several years since her mother died, closely followed by her father dying.  Her mother died of an overdose.     Past Medical History:  Diagnosis Date  . Abnormal Pap smear 1998   had colpo  . Anxiety   . Asthma    ,as child only   no inhaler  . Bicornate uterus   . Bicornuate uterus   . Chlamydia 2000  . GERD (gastroesophageal reflux disease)   . HCV infection    HX of Hep C  . Headache(784.0)   . Hypertension 2010  . IUP (intrauterine pregnancy), incidental 13 weeks  . Opiate dependence ([place])    stopped methadone 01/2011  . PONV (postoperative nausea and vomiting)   . STD (female)    HX of STD's    Patient Active Problem List   Diagnosis Date Noted  . Adjustment disorder with mixed anxiety and depressed mood 01/10/2020  . Polysubstance abuse ([place]) 01/10/2020  . Gastroesophageal reflux disease 03/08/2017  . Anxiety 03/08/2017  . Anxiety and depression 10/10/2015  . History of intravenous drug use in remission 09/07/2011  . HCV infection   . Essential hypertension   . Bipolar disorder ([place])   . Bicornuate uterus     Past Surgical History:  Procedure Laterality Date  . BILATERAL SALPINGECTOMY  12/06/2012   Procedure: BILATERAL SALPINGECTOMY;  Surgeon: Mora Bellman, MD;  Location: [facility];  Service: Gynecology;  Laterality: Bilateral;  laparoscopic  . CERVICAL CERCLAGE  09/01/2011   Procedure: CERCLAGE  CERVICAL;  Surgeon: Donnamae Jude, MD;  Location: [facility];  Service: Gynecology;  Laterality: N/A;  . CERVICAL CERCLAGE  06/28/2012   Procedure: CERCLAGE CERVICAL;  Surgeon: Donnamae Jude, MD;  Location: [facility];  Service: Gynecology;  Laterality: N/A;  . DILATION AND CURETTAGE OF UTERUS     SAB  . FINGER SURGERY  2003   reattachment of right forefinger  . THERAPEUTIC ABORTION    . THERAPEUTIC ABORTION    . TONSILLECTOMY    . TUBAL LIGATION  11/23/2012    OB History    Gravida  7   Para  4   Term  2   Preterm  2   AB  3   Living  2     SAB  1   TAB  2   Ectopic  0   Multiple  0   Live Births  3            Home Medications    Prior to Admission medications   Medication Sig Start Date End Date Taking? Authorizing Provider  acetaminophen (TYLENOL) 500 MG tablet Take 1,000 mg by mouth every 6 (six) hours as needed for moderate pain or headache.    [provider]  amLODipine (NORVASC) 10 MG tablet Take 1 tablet (10 mg total) by mouth daily. 03/10/20  Robyn Haber, MD  furosemide (LASIX) 20 MG tablet Take 1 tablet (20 mg total) by mouth daily as needed for edema. 02/18/19   Vanessa Kick, MD  ibuprofen (ADVIL,MOTRIN) 200 MG tablet Take 800 mg by mouth every 6 (six) hours as needed for headache or moderate pain.    [provider]  omeprazole (PRILOSEC) 20 MG capsule Take 1 capsule (20 mg total) by mouth daily. 03/10/20   Robyn Haber, MD  venlafaxine XR (EFFEXOR-XR) 150 MG 24 hr capsule Take 1 capsule (150 mg total) by mouth daily with breakfast. 06/06/18   Raylene Everts, MD  losartan-hydrochlorothiazide (HYZAAR) 100-25 MG tablet Take 1 tablet by mouth daily. 06/10/19 01/02/20  Vanessa Kick, MD    Family History Family History  Problem Relation Age of Onset  . Hypertension Mother   . Mental illness Mother        depression; alot of mental illness.  . Cancer Father 42       Colon cancer age 25  . Thyroid disease Maternal Grandmother   .  Diabetes Paternal Grandfather   . Anesthesia problems Neg Hx   . Other Neg Hx     Social History Social History   Tobacco Use  . Smoking status: Current Every Day Smoker    Packs/day: 0.50    Years: 20.00    Pack years: 10.00    Types: Cigarettes    Start date: 05/04/2014  . Smokeless tobacco: Never Used  . Tobacco comment: using the vapor only  Substance Use Topics  . Alcohol use: Yes    Comment: rarely  . Drug use: No    Types: Cocaine, IV    Comment: not currently using     Allergies   Vicodin [hydrocodone-acetaminophen]   Review of Systems Review of Systems   Physical Exam Triage Vital Signs ED Triage Vitals  Enc Vitals Group     BP      Pulse      Resp      Temp      Temp src      SpO2      Weight      Height      Head Circumference      Peak Flow      Pain Score      Pain Loc      Pain Edu?      Excl. in [place]?    No data found.  Updated Vital Signs BP (!) 187/115 (BP Location: Right Arm)   Pulse 98   Temp 97.9 F (36.6 C) (Oral)   Resp 18   LMP 03/10/2020   SpO2 100%    Physical Exam Vitals and nursing note reviewed.  Constitutional:      Appearance: Normal appearance. She is obese.  HENT:     Head: Normocephalic.     Right Ear: External ear normal.     Left Ear: External ear normal.     Nose: Nose normal.     Mouth/Throat:     Pharynx: Oropharynx is clear.  Eyes:     Conjunctiva/sclera: Conjunctivae normal.  Cardiovascular:     Rate and Rhythm: Normal rate.     Heart sounds: Normal heart sounds.  Pulmonary:     Effort: Pulmonary effort is normal.     Breath sounds: Normal breath sounds.  Abdominal:     Palpations: Abdomen is soft. There is no mass.     Tenderness: There is no abdominal tenderness.  Musculoskeletal:  General: Normal range of motion.     Cervical back: Normal range of motion and neck supple.  Skin:    General: Skin is warm and dry.  Neurological:     General: No focal deficit present.     Mental  Status: She is alert and oriented to person, place, and time.  Psychiatric:        Mood and Affect: Mood normal.        Behavior: Behavior normal.        Thought Content: Thought content normal.        Judgment: Judgment normal.      UC Treatments / Results  Labs (all labs ordered are listed, but only abnormal results are displayed) Labs Reviewed - No data to display  EKG   Radiology No results found.  Procedures Procedures (including critical care time)  Medications Ordered in UC Medications - No data to display  Initial Impression / Assessment and Plan / UC Course  I have reviewed the triage vital signs and the nursing notes.  Pertinent labs & imaging results that were available during my care of the patient were reviewed by me and considered in my medical decision making (see chart for details).    Final Clinical Impressions(s) / UC Diagnoses   Final diagnoses:  Essential hypertension  Gastroesophageal reflux disease, unspecified whether esophagitis present   Discharge Instructions   None    ED Prescriptions    Medication Sig Dispense Auth. Provider   amLODipine (NORVASC) 10 MG tablet Take 1 tablet (10 mg total) by mouth daily. 90 tablet Robyn Haber, MD   omeprazole (PRILOSEC) 20 MG capsule Take 1 capsule (20 mg total) by mouth daily. 90 capsule Robyn Haber, MD     I have reviewed the PDMP during this encounter.   Robyn Haber, MD 03/10/20 [phone]

## 2020-03-22 ENCOUNTER — Inpatient Hospital Stay (HOSPITAL_COMMUNITY)
Admission: AD | Admit: 2020-03-22 | Discharge: 2020-03-27 | DRG: 885 | Disposition: A | Discharge status: Home / Self Care | Payer: Self-pay | Attending: Psychiatry | Admitting: Psychiatry

## 2020-03-22 ENCOUNTER — Encounter (HOSPITAL_COMMUNITY): Payer: Self-pay | Admitting: Family

## 2020-03-22 ENCOUNTER — Other Ambulatory Visit: Payer: Self-pay

## 2020-03-22 DIAGNOSIS — F333 Major depressive disorder, recurrent, severe with psychotic symptoms: Secondary | ICD-10-CM | POA: Diagnosis present

## 2020-03-22 DIAGNOSIS — F1290 Cannabis use, unspecified, uncomplicated: Secondary | ICD-10-CM | POA: Diagnosis present

## 2020-03-22 DIAGNOSIS — I10 Essential (primary) hypertension: Secondary | ICD-10-CM | POA: Diagnosis present

## 2020-03-22 DIAGNOSIS — Z818 Family history of other mental and behavioral disorders: Secondary | ICD-10-CM

## 2020-03-22 DIAGNOSIS — F419 Anxiety disorder, unspecified: Secondary | ICD-10-CM | POA: Diagnosis present

## 2020-03-22 DIAGNOSIS — R45851 Suicidal ideations: Secondary | ICD-10-CM | POA: Diagnosis present

## 2020-03-22 DIAGNOSIS — F17210 Nicotine dependence, cigarettes, uncomplicated: Secondary | ICD-10-CM | POA: Diagnosis present

## 2020-03-22 DIAGNOSIS — Z8249 Family history of ischemic heart disease and other diseases of the circulatory system: Secondary | ICD-10-CM

## 2020-03-22 DIAGNOSIS — Z20822 Contact with and (suspected) exposure to covid-19: Secondary | ICD-10-CM | POA: Diagnosis present

## 2020-03-22 DIAGNOSIS — R197 Diarrhea, unspecified: Secondary | ICD-10-CM | POA: Diagnosis present

## 2020-03-22 DIAGNOSIS — K219 Gastro-esophageal reflux disease without esophagitis: Secondary | ICD-10-CM | POA: Diagnosis present

## 2020-03-22 DIAGNOSIS — Z800 Family history of malignant neoplasm of digestive organs: Secondary | ICD-10-CM

## 2020-03-22 DIAGNOSIS — D509 Iron deficiency anemia, unspecified: Secondary | ICD-10-CM | POA: Diagnosis present

## 2020-03-22 DIAGNOSIS — F332 Major depressive disorder, recurrent severe without psychotic features: Principal | ICD-10-CM | POA: Diagnosis present

## 2020-03-22 DIAGNOSIS — Z79899 Other long term (current) drug therapy: Secondary | ICD-10-CM

## 2020-03-22 DIAGNOSIS — G4700 Insomnia, unspecified: Secondary | ICD-10-CM | POA: Diagnosis not present

## 2020-03-22 DIAGNOSIS — Z885 Allergy status to narcotic agent status: Secondary | ICD-10-CM

## 2020-03-22 DIAGNOSIS — Z833 Family history of diabetes mellitus: Secondary | ICD-10-CM

## 2020-03-22 DIAGNOSIS — F1990 Other psychoactive substance use, unspecified, uncomplicated: Secondary | ICD-10-CM | POA: Diagnosis present

## 2020-03-22 DIAGNOSIS — Z560 Unemployment, unspecified: Secondary | ICD-10-CM

## 2020-03-22 DIAGNOSIS — E876 Hypokalemia: Secondary | ICD-10-CM | POA: Diagnosis present

## 2020-03-22 DIAGNOSIS — F1520 Other stimulant dependence, uncomplicated: Secondary | ICD-10-CM | POA: Diagnosis present

## 2020-03-22 DIAGNOSIS — R9431 Abnormal electrocardiogram [ECG] [EKG]: Secondary | ICD-10-CM | POA: Diagnosis present

## 2020-03-22 LAB — RESPIRATORY PANEL BY RT PCR (FLU A&B, COVID)
Influenza A by PCR: NEGATIVE
Influenza B by PCR: NEGATIVE
SARS Coronavirus 2 by RT PCR: NEGATIVE

## 2020-03-22 MED ORDER — MAGNESIUM HYDROXIDE 400 MG/5ML PO SUSP: 30.0000 mL | Freq: Every day | ORAL | Status: DC | PRN

## 2020-03-22 MED ORDER — ALUM & MAG HYDROXIDE-SIMETH 200-200-20 MG/5ML PO SUSP: 30.0000 mL | ORAL | Status: DC | PRN

## 2020-03-22 MED ORDER — HYDROXYZINE HCL 25 MG PO TABS
25.0000 mg | ORAL_TABLET | Freq: Three times a day (TID) | ORAL | Status: DC | PRN
  Administered 2020-03-22 – 2020-03-23 (×2): 25 mg via ORAL
  Filled 2020-03-22 (×2): qty 1

## 2020-03-22 MED ORDER — IBUPROFEN 800 MG PO TABS
800.0000 mg | ORAL_TABLET | Freq: Four times a day (QID) | ORAL | Status: DC | PRN
  Administered 2020-03-22: 800 mg via ORAL
  Filled 2020-03-22: qty 1

## 2020-03-22 MED ORDER — TRAZODONE HCL 50 MG PO TABS
50.0000 mg | ORAL_TABLET | Freq: Every evening | ORAL | Status: DC | PRN
  Administered 2020-03-22: 50 mg via ORAL
  Filled 2020-03-22: qty 1

## 2020-03-22 MED ORDER — PANTOPRAZOLE SODIUM 40 MG PO TBEC
40.0000 mg | DELAYED_RELEASE_TABLET | Freq: Every day | ORAL | Status: DC
  Administered 2020-03-23 – 2020-03-27 (×5): 40 mg via ORAL
  Filled 2020-03-22 (×6): qty 1
  Filled 2020-03-22: qty 7

## 2020-03-22 MED ORDER — AMLODIPINE BESYLATE 10 MG PO TABS
10.0000 mg | ORAL_TABLET | Freq: Every day | ORAL | Status: DC
  Administered 2020-03-23 – 2020-03-27 (×5): 10 mg via ORAL
  Filled 2020-03-22: qty 2
  Filled 2020-03-22 (×3): qty 1
  Filled 2020-03-22: qty 7
  Filled 2020-03-22 (×2): qty 1
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
## 2020-03-22 NOTE — Plan of Care (Signed)
[place] Observation Crisis Plan  Reason for Crisis Plan:  Crisis Stabilization   Plan of Care:  Referral for Inpatient Hospitalization  Family Support:      Current Living Environment:  Living Arrangements: Alone  Insurance:   Hospital Account    Name Acct ID Class Status Primary Coverage   [name], [name] ID:[id number] [place] Open None        Guarantor Account (for Hospital Account [id number])    Name Relation to Pt Service Area Active? Acct Type   [name] Self [facility] Yes Behavioral Health   Address Phone       [address] [place], [place] [address] [phone])          Coverage Information (for Hospital Account [id number])    Not on file      Legal Guardian:  Legal Guardian: Other:(self)  Primary Care Provider:  Patient, No Pcp Per  Current Outpatient Providers:  [facility]  Psychiatrist:  Name of Psychiatrist: [facility]  Counselor/Therapist:  Name of Therapist: Beverly Edwards  Compliant with Medications:  Yes  Additional Information:   [name] 4/30/202110:56 PM

## 2020-03-23 DIAGNOSIS — F333 Major depressive disorder, recurrent, severe with psychotic symptoms: Secondary | ICD-10-CM | POA: Diagnosis present

## 2020-03-23 DIAGNOSIS — F1520 Other stimulant dependence, uncomplicated: Secondary | ICD-10-CM

## 2020-03-23 LAB — LIPID PANEL
Cholesterol: 224 mg/dL — ABNORMAL HIGH (ref 0–200)
HDL: 63 mg/dL (ref >40)
LDL Cholesterol: 118 mg/dL — ABNORMAL HIGH (ref 0–99)
Total CHOL/HDL Ratio: 3.6 RATIO
Triglycerides: 215 mg/dL — ABNORMAL HIGH (ref <150)
VLDL: 43 mg/dL — ABNORMAL HIGH (ref 0–40)

## 2020-03-23 LAB — CBC
HCT: 33.2 % — ABNORMAL LOW (ref 36.0–46.0)
Hemoglobin: 9.7 g/dL — ABNORMAL LOW (ref 12.0–15.0)
MCH: 20.8 pg — ABNORMAL LOW (ref 26.0–34.0)
MCHC: 29.2 g/dL — ABNORMAL LOW (ref 30.0–36.0)
MCV: 71.1 fL — ABNORMAL LOW (ref 80.0–100.0)
Platelets: 457 10*3/uL — ABNORMAL HIGH (ref 150–400)
RBC: 4.67 MIL/uL (ref 3.87–5.11)
RDW: 17.5 % — ABNORMAL HIGH (ref 11.5–15.5)
WBC: 6.1 10*3/uL (ref 4.0–10.5)
nRBC: 0 % (ref 0.0–0.2)

## 2020-03-23 LAB — COMPREHENSIVE METABOLIC PANEL
ALT: 28 U/L (ref 0–44)
AST: 26 U/L (ref 15–41)
Albumin: 4 g/dL (ref 3.5–5.0)
Alkaline Phosphatase: 83 U/L (ref 38–126)
Anion gap: 10 (ref 5–15)
BUN: 8 mg/dL (ref 6–20)
CO2: 27 mmol/L (ref 22–32)
Calcium: 9.2 mg/dL (ref 8.9–10.3)
Chloride: 102 mmol/L (ref 98–111)
Creatinine, Ser: 0.68 mg/dL (ref 0.44–1.00)
GFR calc Af Amer: >60 mL/min (ref >60)
GFR calc non Af Amer: >60 mL/min (ref >60)
Glucose, Bld: 110 mg/dL — ABNORMAL HIGH (ref 70–99)
Potassium: 2.9 mmol/L — ABNORMAL LOW (ref 3.5–5.1)
Sodium: 139 mmol/L (ref 135–145)
Total Bilirubin: 0.6 mg/dL (ref 0.3–1.2)
Total Protein: 7.6 g/dL (ref 6.5–8.1)

## 2020-03-23 LAB — RETICULOCYTES
Immature Retic Fract: 22 % — ABNORMAL HIGH (ref 2.3–15.9)
RBC.: 4.5 MIL/uL (ref 3.87–5.11)
Retic Count, Absolute: 78.8 10*3/uL (ref 19.0–186.0)
Retic Ct Pct: 1.8 % (ref 0.4–3.1)

## 2020-03-23 LAB — FOLATE: Folate: 11.2 ng/mL (ref >5.9)

## 2020-03-23 LAB — IRON AND TIBC
Iron: 26 ug/dL — ABNORMAL LOW (ref 28–170)
Saturation Ratios: 5 % — ABNORMAL LOW (ref 10.4–31.8)
TIBC: 575 ug/dL — ABNORMAL HIGH (ref 250–450)
UIBC: 549 ug/dL

## 2020-03-23 LAB — TSH: TSH: 1.136 u[IU]/mL (ref 0.350–4.500)

## 2020-03-23 LAB — FERRITIN: Ferritin: 4 ng/mL — ABNORMAL LOW (ref 11–307)

## 2020-03-23 LAB — HEMOGLOBIN A1C
Hgb A1c MFr Bld: 6.1 % — ABNORMAL HIGH (ref 4.8–5.6)
Mean Plasma Glucose: 128.37 mg/dL

## 2020-03-23 LAB — VITAMIN B12: Vitamin B-12: 252 pg/mL (ref 180–914)

## 2020-03-23 MED ORDER — VENLAFAXINE HCL ER 150 MG PO CP24
150.0000 mg | ORAL_CAPSULE | Freq: Every day | ORAL | Status: DC
  Filled 2020-03-23: qty 1

## 2020-03-23 MED ORDER — LORAZEPAM 0.5 MG PO TABS
0.5000 mg | ORAL_TABLET | Freq: Four times a day (QID) | ORAL | Status: DC | PRN
  Administered 2020-03-23 – 2020-03-24 (×4): 0.5 mg via ORAL
  Filled 2020-03-23 (×4): qty 1

## 2020-03-23 MED ORDER — TRAZODONE HCL 50 MG PO TABS: 50.0000 mg | ORAL_TABLET | Freq: Every evening | ORAL | Status: DC | PRN

## 2020-03-23 MED ORDER — OLANZAPINE 5 MG PO TABS
5.0000 mg | ORAL_TABLET | Freq: Every day | ORAL | Status: DC
  Filled 2020-03-23: qty 1

## 2020-03-23 MED ORDER — IBUPROFEN 400 MG PO TABS
400.0000 mg | ORAL_TABLET | Freq: Four times a day (QID) | ORAL | Status: DC | PRN
  Administered 2020-03-25: 400 mg via ORAL
  Filled 2020-03-23 (×2): qty 1

## 2020-03-23 MED ORDER — NICOTINE POLACRILEX 2 MG MT GUM
2.0000 mg | CHEWING_GUM | OROMUCOSAL | Status: DC | PRN
  Administered 2020-03-26: 2 mg via ORAL
  Filled 2020-03-23: qty 1

## 2020-03-23 MED ORDER — POTASSIUM CHLORIDE CRYS ER 20 MEQ PO TBCR
20.0000 meq | EXTENDED_RELEASE_TABLET | Freq: Two times a day (BID) | ORAL | Status: DC
  Administered 2020-03-23 – 2020-03-24 (×2): 20 meq via ORAL
  Filled 2020-03-23 (×4): qty 1

## 2020-03-23 NOTE — Progress Notes (Signed)
[place] Group Notes:  (Nursing/MHT/Case Management/Adjunct)  Date:  03/23/2020  Time:  2030  Type of Therapy:  wrap up group  Participation Level:  Active  Participation Quality:  Appropriate, Attentive, Sharing and Supportive  Affect:  Depressed  Cognitive:  Appropriate  Insight:  Improving  Engagement in Group:  Engaged  Modes of Intervention:  Clarification, Education and Support  Summary of Progress/Problems: Positive thinkiing and positive change were discussed.   Winfield Rast S 03/23/2020, 9:04 PM

## 2020-03-23 NOTE — Progress Notes (Signed)
Pt a new admit to 300 unit. Presents guarded with sullen affect, soft and logical speech, ambulatory with a steady gait. Denies SI, HI, AVH and pain at this time.  Q 15 minutes safety checks initiated without self harm gestures. Writer encouraged pt to voice concerns. Pt remains safe on unit. patient oriented to unit.
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
## 2020-03-23 NOTE — Progress Notes (Signed)
   03/23/20 2234  Psych Admission Type (Psych Patients Only)  Admission Status Voluntary  Psychosocial Assessment  Patient Complaints Anxiety;Depression  Eye Contact Fair  Facial Expression Flat  Affect Appropriate to circumstance  Speech Logical/coherent  Interaction Assertive  Motor Activity Other (Comment) (WDL)  Appearance/Hygiene Unremarkable  Behavior Characteristics Appropriate to situation  Mood Pleasant;Depressed  Thought Process  Coherency WDL  Content WDL  Delusions None reported or observed  Perception WDL  Hallucination None reported or observed  Judgment Impaired  Confusion None  Danger to Self  Current suicidal ideation? Denies  Self-Injurious Behavior No self-injurious ideation or behavior indicators observed or expressed   Agreement Not to Harm Self Yes  Description of Agreement verbally contracts for safety  Danger to Others  Danger to Others None reported or observed
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
## 2020-03-23 NOTE — Progress Notes (Signed)
Patient ID: [name], female   DOB: June 04, 1976, 45 y.o.   MRN: [id number] 03/23/2020 at 9 AM  Observation unit progress note  Patient seen with RN and NP.  44 year old female, lives alone, unemployed, has 2 adult children. Presented voluntarily for worsening depression, suicidal ideations with thoughts of overdosing, Endorses neurovegetative symptoms of depression to include anhedonia, poor sleep, poor appetite, low energy level.  Reports has been ruminative related to multiple losses of loved ones , and explains her mother passed away in February 21, 2017, her father in 02/21/18 and her children's father in 2019-02-22. She also states  she relapsed on cannabis and methamphetamine recently after long period of sobriety.  She had recently participated in a rehab program but relapsed following discharge.  She endorses a history of depression, also reports has been diagnosed bipolar disorder in the past. Home medications include Effexor XR 150 mg daily, Latuda(dose?).  Medical history is remarkable for hypertension.  Today patient presents alert, attentive, depressed, with a sad/constricted affect.  No thought disorder.  Denies hallucinations, no delusions expressed, does not appear internally preoccupied.  She endorses suicidal ideations, has had thoughts of overdosing.  No HI.  O x3.  Dx- MDD, consider Substance Induced Mood Disorder   Plan-inpatient psychiatric admission for treatment and crisis stabilization .  Gabriel Earing, MD

## 2020-03-23 NOTE — Progress Notes (Signed)
   03/23/20 2231  COVID-19 Daily Checkoff  Have you had a fever (temp > 37.80C/100F)  in the past 24 hours?  No  If you have had runny nose, nasal congestion, sneezing in the past 24 hours, has it worsened? No  COVID-19 EXPOSURE  Have you traveled outside the state in the past 14 days? No  Have you been in contact with someone with a confirmed diagnosis of COVID-19 or PUI in the past 14 days without wearing appropriate PPE? No  Have you been living in the same home as a person with confirmed diagnosis of COVID-19 or a PUI (household contact)? No  Have you been diagnosed with COVID-19? No

## 2020-03-24 DIAGNOSIS — E876 Hypokalemia: Secondary | ICD-10-CM

## 2020-03-24 DIAGNOSIS — D509 Iron deficiency anemia, unspecified: Secondary | ICD-10-CM

## 2020-03-24 DIAGNOSIS — R197 Diarrhea, unspecified: Secondary | ICD-10-CM

## 2020-03-24 LAB — BASIC METABOLIC PANEL
Anion gap: 13 (ref 5–15)
BUN: 7 mg/dL (ref 6–20)
CO2: 22 mmol/L (ref 22–32)
Calcium: 9.8 mg/dL (ref 8.9–10.3)
Chloride: 103 mmol/L (ref 98–111)
Creatinine, Ser: 0.78 mg/dL (ref 0.44–1.00)
GFR calc Af Amer: >60 mL/min (ref >60)
GFR calc non Af Amer: >60 mL/min (ref >60)
Glucose, Bld: 91 mg/dL (ref 70–99)
Potassium: 4.8 mmol/L (ref 3.5–5.1)
Sodium: 138 mmol/L (ref 135–145)

## 2020-03-24 LAB — MAGNESIUM: Magnesium: 1.8 mg/dL (ref 1.7–2.4)

## 2020-03-24 MED ORDER — LORAZEPAM 1 MG PO TABS
1.0000 mg | ORAL_TABLET | Freq: Three times a day (TID) | ORAL | Status: DC | PRN
  Administered 2020-03-24 – 2020-03-26 (×5): 1 mg via ORAL
  Filled 2020-03-24 (×6): qty 1

## 2020-03-24 MED ORDER — POTASSIUM CHLORIDE CRYS ER 20 MEQ PO TBCR: 20.0000 meq | EXTENDED_RELEASE_TABLET | Freq: Two times a day (BID) | ORAL | Status: DC

## 2020-03-24 MED ORDER — POTASSIUM CHLORIDE CRYS ER 20 MEQ PO TBCR
40.0000 meq | EXTENDED_RELEASE_TABLET | Freq: Two times a day (BID) | ORAL | Status: DC
  Administered 2020-03-24: 17:00:00 40 meq via ORAL
  Filled 2020-03-24 (×2): qty 2

## 2020-03-24 MED ORDER — FERROUS SULFATE 325 (65 FE) MG PO TABS
325.0000 mg | ORAL_TABLET | Freq: Every day | ORAL | Status: DC
  Administered 2020-03-24 – 2020-03-27 (×4): 325 mg via ORAL
  Filled 2020-03-24 (×7): qty 1

## 2020-03-24 MED ORDER — WHITE PETROLATUM EX OINT
TOPICAL_OINTMENT | CUTANEOUS | Status: AC
  Filled 2020-03-24: qty 5

## 2020-03-24 NOTE — [facility] Suicide Risk Assessment (Signed)
[facility] INPATIENT:  Family/Significant Other Suicide Prevention Education  Suicide Prevention Education:  Education Completed; aunt Calyn Drymon [phone] ,  (name of family member/significant other) has been identified by the patient as the family member/significant other with whom the patient will be residing, and identified as the person(s) who will aid the patient in the event of a mental health crisis (suicidal ideations/suicide attempt).  With written consent from the patient, the family member/significant other has been provided the following suicide prevention education, prior to the and/or following the discharge of the patient.  Aunt states that patient can stay with her at discharge if she wants, thinks it will be a very bad thing for her to go to her house alone.  Aunt will do anything to help her work through her depression and substance abuse, including buying a house together, mowing her grass, taking care of her animals like she is doing now.  She herself has been through depression.  Aunt has expressed to patient how important she is to her, which made patient cry.  She is very supportive.  The suicide prevention education provided includes the following:  Suicide risk factors  Suicide prevention and interventions  National Suicide Hotline telephone number  [facility] assessment telephone number  [facility] Emergency Assistance [place] and/or Residential Mobile Crisis Unit telephone number  Request made of family/significant other to:  Remove weapons (e.g., guns, rifles, knives), all items previously/currently identified as safety concern.    Remove drugs/medications (over-the-counter, prescriptions, illicit drugs), all items previously/currently identified as a safety concern.  The family member/significant other verbalizes understanding of the suicide prevention education information provided.  The family member/significant other agrees to  remove the items of safety concern listed above.  [name] 03/24/2020, 4:03 PM

## 2020-03-24 NOTE — Progress Notes (Addendum)
[facility] MD Progress Note  03/24/2020 8:38 AM [name]  MRN:  [id number] Subjective:  Patient reports feeling depressed, but denies suicidal or self injurious ideations. Reports fair sleep. Does not endorse medication side effects. Objective : I have reviewed chart notes and have met with patient. 44 year old female, presented to hospital voluntarily for worsening depression, suicidal ideations, which were described as passive, neuro-vegetative symptoms of depression. She has a history of substance use disorder ( methamphetamine) . She reported significant losses/stressors over recent years to include death of both her parents, her children's father, and relationship stressors .  Today patient presents alert , attentive, polite on approach, without psychomotor agitation. Remains depressed and partially constricted in affect but does smile appropriately at times. Patient has been visible in day room at times, has participated in groups  Denies suicidal ideations. Labs reviewed- anemia work up ( admission hgb 9.7/ MCV 71.1) suggestive of iron deficiency anemia, with decreased iron, ferritin and saturation ratio, increased TIBC. Repeat BMP- still hypokalemic at 2.9  5/2 EKG - QTc 508.     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 Abnormal Pap smear 1998   had colpo  . Anxiety   . Asthma    ,as child only   no inhaler  . Bicornate uterus   . Bicornuate uterus   . Chlamydia 2000  . GERD (gastroesophageal reflux disease)   . HCV infection    HX of Hep C  . Headache(784.0)   . Hypertension 2010  . IUP (intrauterine pregnancy), incidental 13 weeks  .  Opiate dependence ([place])    stopped methadone 01/2011  . PONV (postoperative nausea and vomiting)   . STD (female)    HX of STD's    Past Surgical History:  Procedure Laterality Date  . BILATERAL SALPINGECTOMY  12/06/2012   Procedure: BILATERAL SALPINGECTOMY;  Surgeon: Mora Bellman, MD;  Location: [facility];  Service: Gynecology;  Laterality: Bilateral;  laparoscopic  . CERVICAL CERCLAGE  09/01/2011   Procedure: CERCLAGE CERVICAL;  Surgeon: Donnamae Jude, MD;  Location: [facility];  Service: Gynecology;  Laterality: N/A;  . CERVICAL CERCLAGE  06/28/2012   Procedure: CERCLAGE CERVICAL;  Surgeon: Donnamae Jude, MD;  Location: [facility];  Service: Gynecology;  Laterality: N/A;  . DILATION AND CURETTAGE OF UTERUS     SAB  . FINGER SURGERY  2003   reattachment of right forefinger  . THERAPEUTIC ABORTION    . THERAPEUTIC ABORTION    . TONSILLECTOMY    . TUBAL LIGATION  11/23/2012   Family History:  Family History  Problem Relation Age of Onset  . Hypertension Mother   . Mental illness Mother        depression; alot of mental illness.  . Cancer Father 33       Colon cancer age 67  . Thyroid disease Maternal Grandmother   . Diabetes Paternal Grandfather   . Anesthesia problems Neg Hx   . Other Neg Hx    Family Psychiatric  History:  Social History:  Social History   Substance and Sexual Activity  Alcohol Use Yes   Comment: rarely     Social History   Substance and Sexual Activity  Drug Use No  . Types: Cocaine, IV   Comment: not currently using    Social History   Socioeconomic History  . Marital status: Single    Spouse name: Not on file  . Number of children: Not on file  . Years of education: Not on file  . Highest education level: Not on file  Occupational History  . Not on file  Tobacco Use  . Smoking status: Current Every Day Smoker    Packs/day: 0.50    Years: 20.00    Pack years: 10.00    Types: Cigarettes    Start date: 05/04/2014  . Smokeless tobacco: Never Used   . Tobacco comment: using the vapor only  Substance and Sexual Activity  . Alcohol use: Yes    Comment: rarely  . Drug use: No    Types: Cocaine, IV    Comment: not currently using  . Sexual activity: Yes    Birth control/protection: Surgical  Other Topics Concern  . Not on file  Social History Narrative   Marital status: single; dating seriously x 3 years in 2016; males only.     Children: 2 children; no grandchildren.     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      Drugs:  Clean 02/14/2013; attends NA every day; sponsor.        Sexual activity:  Total partners = 150.  +chlamydia in 1998.        Seatbelt: 100%      Guns:     Social Determinants of Radio broadcast assistant Strain:   . Difficulty of Paying Living Expenses:   Food Insecurity:   . Worried About Charity fundraiser in the Last Year:   . Arboriculturist in the Last Year:   Transportation Needs:   . Film/video editor (Medical):   Marland Kitchen Lack of Transportation (Non-Medical):   Physical Activity:   . Days of Exercise per Week:   . Minutes of Exercise per Session:   Stress:   . Feeling of Stress :   Social Connections:   . Frequency of Communication with Friends and Family:   . Frequency of Social Gatherings with Friends and Family:   . Attends Religious Services:   . Active Member of Clubs or Organizations:   . Attends Archivist Meetings:   Marland Kitchen Marital Status:    Additional Social History:    Pain Medications: see MAR Prescriptions: see MAR Over the Counter: see MAR  Sleep: Fair  Appetite:  Fair  Current Medications: Current Facility-Administered Medications  Medication Dose Route Frequency Provider Last Rate Last Admin  . alum & mag hydroxide-simeth (MAALOX/MYLANTA) 200-200-20 MG/5ML suspension 30 mL  30 mL Oral Q4H PRN  Lindon Romp A, NP      . amLODipine (NORVASC) tablet 10 mg  10 mg Oral Daily Lindon Romp A, NP   10 mg at 03/24/20 0747  . ferrous sulfate tablet 325 mg  325 mg Oral Q breakfast Matyas Baisley, Myer Peer, MD   325 mg at 03/24/20 0750  . ibuprofen (ADVIL) tablet 400 mg  400 mg Oral Q6H PRN Darya Bigler A, MD      . LORazepam (ATIVAN) tablet 0.5 mg  0.5 mg Oral Q6H PRN Ananda Sitzer, Myer Peer,  MD   0.5 mg at 03/24/20 0802  . nicotine polacrilex (NICORETTE) gum 2 mg  2 mg Oral PRN Dejia Ebron, Myer Peer, MD      . pantoprazole (PROTONIX) EC tablet 40 mg  40 mg Oral Daily Lindon Romp A, NP   40 mg at 03/24/20 0747  . potassium chloride SA (KLOR-CON) CR tablet 20 mEq  20 mEq Oral BID Marquavion Venhuizen, Myer Peer, MD   20 mEq at 03/24/20 2585    Lab Results:  Results for orders placed or performed during the hospital encounter of 03/22/20 (from the past 48 hour(s))  Respiratory Panel by RT PCR (Flu A&B, Covid) - Nasopharyngeal Swab     Status: None   Collection Time: 03/22/20  8:03 PM   Specimen: Nasopharyngeal Swab  Result Value Ref Range   SARS Coronavirus 2 by RT PCR NEGATIVE NEGATIVE    Comment: (NOTE) SARS-CoV-2 target nucleic acids are NOT DETECTED. The SARS-CoV-2 RNA is generally detectable in upper respiratoy specimens during the acute phase of infection. The lowest concentration of SARS-CoV-2 viral copies this assay can detect is 131 copies/mL. A negative result does not preclude SARS-Cov-2 infection and should not be used as the sole basis for treatment or other patient management decisions. A negative result may occur with  improper specimen collection/handling, submission of specimen other than nasopharyngeal swab, presence of viral mutation(s) within the areas targeted by this assay, and inadequate number of viral copies (<131 copies/mL). A negative result must be combined with clinical observations, patient history, and epidemiological information. The expected result is Negative. Fact Sheet for  Patients:  PinkCheek.be Fact Sheet for Healthcare Providers:  GravelBags.it This test is not yet ap proved or cleared by the [place] FDA and  has been authorized for detection and/or diagnosis of SARS-CoV-2 by FDA under an Emergency Use Authorization (EUA). This EUA will remain  in effect (meaning this test can be used) for the duration of the COVID-19 declaration under Section 564(b)(1) of the Act, 21 U.S.C. section 360bbb-3(b)(1), unless the authorization is terminated or revoked sooner.    Influenza A by PCR NEGATIVE NEGATIVE   Influenza B by PCR NEGATIVE NEGATIVE    Comment: (NOTE) The Xpert Xpress SARS-CoV-2/FLU/RSV assay is intended as an aid in  the diagnosis of influenza from Nasopharyngeal swab specimens and  should not be used as a sole basis for treatment. Nasal washings and  aspirates are unacceptable for Xpert Xpress SARS-CoV-2/FLU/RSV  testing. Fact Sheet for Patients: PinkCheek.be Fact Sheet for Healthcare Providers: GravelBags.it This test is not yet approved or cleared by the [place] FDA and  has been authorized for detection and/or diagnosis of SARS-CoV-2 by  FDA under an Emergency Use Authorization (EUA). This EUA will remain  in effect (meaning this test can be used) for the duration of the  Covid-19 declaration under Section 564(b)(1) of the Act, 21  U.S.C. section 360bbb-3(b)(1), unless the authorization is  terminated or revoked. Performed at [facility], [place] [address] [place], [place] [address]   CBC     Status: Abnormal   Collection Time: 03/23/20  7:04 AM  Result Value Ref Range   WBC 6.1 4.0 - 10.5 K/uL   RBC 4.67 3.87 - 5.11 MIL/uL   Hemoglobin 9.7 (L) 12.0 - 15.0 g/dL   HCT 33.2 (L) 36.0 - 46.0 %   MCV 71.1 (L) 80.0 - 100.0 fL   MCH 20.8 (L) 26.0 - 34.0 pg   MCHC 29.2 (L) 30.0 - 36.0  g/dL   RDW 17.5  (H) 11.5 - 15.5 %   Platelets 457 (H) 150 - 400 K/uL   nRBC 0.0 0.0 - 0.2 %    Comment: Performed at [facility], [place] [address], [place], [place] [address]  Comprehensive metabolic panel     Status: Abnormal   Collection Time: 03/23/20  7:04 AM  Result Value Ref Range   Sodium 139 135 - 145 mmol/L   Potassium 2.9 (L) 3.5 - 5.1 mmol/L   Chloride 102 98 - 111 mmol/L   CO2 27 22 - 32 mmol/L   Glucose, Bld 110 (H) 70 - 99 mg/dL    Comment: Glucose reference range applies only to samples taken after fasting for at least 8 hours.   BUN 8 6 - 20 mg/dL   Creatinine, Ser 0.68 0.44 - 1.00 mg/dL   Calcium 9.2 8.9 - 10.3 mg/dL   Total Protein 7.6 6.5 - 8.1 g/dL   Albumin 4.0 3.5 - 5.0 g/dL   AST 26 15 - 41 U/L   ALT 28 0 - 44 U/L   Alkaline Phosphatase 83 38 - 126 U/L   Total Bilirubin 0.6 0.3 - 1.2 mg/dL   GFR calc non Af Amer >60 >60 mL/min   GFR calc Af Amer >60 >60 mL/min   Anion gap 10 5 - 15    Comment: Performed at [facility], [place] [address] [place],  [address]  Hemoglobin A1c     Status: Abnormal   Collection Time: 03/23/20  7:04 AM  Result Value Ref Range   Hgb A1c MFr Bld 6.1 (H) 4.8 - 5.6 %    Comment: (NOTE) Pre diabetes:          5.7%-6.4% Diabetes:              >6.4% Glycemic control for   <7.0% adults with diabetes    Mean Plasma Glucose 128.37 mg/dL    Comment: Performed at [place] [address] [place],  [address]  Lipid panel     Status: Abnormal   Collection Time: 03/23/20  7:04 AM  Result Value Ref Range   Cholesterol 224 (H) 0 - 200 mg/dL   Triglycerides 215 (H) <150 mg/dL   HDL 63 >40 mg/dL   Total CHOL/HDL Ratio 3.6 RATIO   VLDL 43 (H) 0 - 40 mg/dL   LDL Cholesterol 118 (H) 0 - 99 mg/dL    Comment:        Total Cholesterol/HDL:CHD Risk Coronary Heart Disease Risk Table                     Men   Women  1/2 Average Risk   3.4   3.3  Average Risk       5.0   4.4  2 X Average Risk   9.6    7.1  3 X Average Risk  23.4   11.0        Use the calculated Patient Ratio above and the CHD Risk Table to determine the patient's CHD Risk.        ATP III CLASSIFICATION (LDL):  <100     mg/dL   Optimal  100-129  mg/dL   Near or Above                    Optimal  130-159  mg/dL   Borderline  160-189  mg/dL   High  >190  mg/dL   Very High Performed at [place] [address], [place], [place] [address]   TSH     Status: None   Collection Time: 03/23/20  7:04 AM  Result Value Ref Range   TSH 1.136 0.350 - 4.500 uIU/mL    Comment: Performed by a 3rd Generation assay with a functional sensitivity of <=0.01 uIU/mL. Performed at [facility], [place] [address] [place], [place] [address]   Vitamin B12     Status: None   Collection Time: 03/23/20  6:42 PM  Result Value Ref Range   Vitamin B-12 252 180 - 914 pg/mL    Comment: (NOTE) This assay is not validated for testing neonatal or myeloproliferative syndrome specimens for Vitamin B12 levels. Performed at [facility], [place] [address] [place], [place] [address]   Folate     Status: None   Collection Time: 03/23/20  6:42 PM  Result Value Ref Range   Folate 11.2 >5.9 ng/mL    Comment: Performed at [facility], [place] [address], [place], [place] [address]  Iron and TIBC     Status: Abnormal   Collection Time: 03/23/20  6:42 PM  Result Value Ref Range   Iron 26 (L) 28 - 170 ug/dL   TIBC 575 (H) 250 - 450 ug/dL   Saturation Ratios 5 (L) 10.4 - 31.8 %   UIBC 549 ug/dL    Comment: Performed at [facility], [place] [address], [place], [place] [address]  Ferritin     Status: Abnormal   Collection Time: 03/23/20  6:42 PM  Result Value Ref Range   Ferritin 4 (L) 11 - 307 ng/mL    Comment: Performed at [facility], [place] [address] [place], [place] [address]  Reticulocytes     Status: Abnormal   Collection Time:  03/23/20  6:42 PM  Result Value Ref Range   Retic Ct Pct 1.8 0.4 - 3.1 %   RBC. 4.50 3.87 - 5.11 MIL/uL   Retic Count, Absolute 78.8 19.0 - 186.0 K/uL   Immature Retic Fract 22.0 (H) 2.3 - 15.9 %    Comment: Performed at [facility], [place] [address], [place], [place] [address]    Blood Alcohol level:  Lab Results  Component Value Date   ETH 6 (H) 70/62/3762    Metabolic Disorder Labs: Lab Results  Component Value Date   HGBA1C 6.1 (H) 03/23/2020   MPG 128.37 03/23/2020   MPG 117 (H) 09/19/2014   No results found for: PROLACTIN Lab Results  Component Value Date   CHOL 224 (H) 03/23/2020   TRIG 215 (H) 03/23/2020   HDL 63 03/23/2020   CHOLHDL 3.6 03/23/2020   VLDL 43 (H) 03/23/2020   LDLCALC 118 (H) 03/23/2020   LDLCALC 101 (H) 09/19/2014    Physical Findings: AIMS: Facial and Oral Movements Muscles of Facial Expression: None, normal Lips and Perioral Area: None, normal Jaw: None, normal Tongue: None, normal,Extremity Movements Upper (arms, wrists, hands, fingers): None, normal Lower (legs, knees, ankles, toes): None, normal, Trunk Movements Neck, shoulders, hips: None, normal, Overall Severity Severity of abnormal movements (highest score from questions above): None, normal Incapacitation due to abnormal movements: None, normal Patient's awareness of abnormal movements (rate only patient's report): No Awareness, Dental Status Current problems with teeth and/or dentures?: No Does patient usually wear dentures?: No  CIWA:  CIWA-Ar Total: 2 COWS:  COWS Total Score: 4  Musculoskeletal: Strength &  Muscle Tone: within normal limits Gait & Station: normal Patient leans: N/A  Psychiatric Specialty Exam: Physical Exam  Review of Systems denies headache, no chest pain , no shortness of breath, no vomiting , reports recent diarrhea, but no BM since yesterday   Blood pressure 112/83, pulse 88, temperature (!) 97.4 F (36.3 C), temperature source Oral,  resp. rate 16, height 5' 2"  (1.575 m), weight 77.1 kg, last menstrual period 03/10/2020, SpO2 99 %.Body mass index is 31.09 kg/m.  General Appearance: Fairly Groomed  Eye Contact:  Good  Speech:  Normal Rate  Volume:  Decreased  Mood:  Depressed  Affect:  congruent, constricted   Thought Process:  Linear and Descriptions of Associations: Intact  Orientation:  Other:  fully alert and attentive   Thought Content:  no hallucinations, no delusions, not internally preoccupied   Suicidal Thoughts:  No currently denies suicidal or self injurious ideations, denies homicidal or violent ideations  Homicidal Thoughts:  No  Memory:  recent and remote grossly intact   Judgement:  Fair. Improving   Insight:  Fair. Improving  Psychomotor Activity:  Decreased- no psychomotor agitation or restlessness   Concentration:  Concentration: Good and Attention Span: Good  Recall:  Good  Fund of Knowledge:  Good  Language:  Good  Akathisia:  Negative  Handed:  Right  AIMS (if indicated):     Assets:  Desire for Improvement Resilience  ADL's:  Intact  Cognition:  WNL  Sleep:  Number of Hours: 6.5   Assessment -  44 year old female, presented to hospital voluntarily for worsening depression, suicidal ideations, which were described as passive, neuro-vegetative symptoms of depression. She has a history of substance use disorder ( methamphetamine) . She reported significant losses/stressors over recent years to include death of both her parents, her children's father, and relationship stressors .  Today patient presents depressed, constricted in affect but reports feeling hopeful she will improve and denies SI. Behavior on unit in good control. Prolonged QTc ( 507) on repeat EKG this morning, which may be related to hypokalemia. Patient was taking Effexor XR, Zyprexa prior to admission but these are currently being held due to QTc prolongation. She complains of some insomnia but is not currently endorsing  significant symptoms of Venlafaxine WDL. Will manage anxiety with Ativan PRN as needed . Anemia work up suggestive of iron deficiency anemia and reports she has been prescribed Iron supplement in the past but was not taking .  Treatment Plan Summary: Daily contact with patient to assess and evaluate symptoms and progress in treatment, Medication management, Plan inpatient treatment  and medications as below Encourage group and milieu participation Encourage efforts to work on coping skills and symptom reduction For now will not resume Effexor XR - see above  Continue Norvasc 10 mgrs QDAY for HTN Continue Protonix 40 mgrs QDAY for GERD symptoms Increase Ativan to 1 mgr Q 8 hours PRN for anxiety or insomnia Start FeSO4 supplement for anemia Continue K+ supplementation. I have reviewed case with hospitalist consultant for help with management.  Recheck BMP, MG .  Jenne Campus, MD 03/24/2020, 8:38 AM   03/24/2020 at 6,00 PM  Addendum  Patient reports feeling depressed, but denies suicidal ideations and contracts for safety on unit. Affect vaguely constricted but reactive  She appears calm and in no acute distress .  Appreciate Hospitalist consult, have reviewed with Dr. Cathlean Sauer . As noted, manually calculated QTc 449. Labs pending.  Gabriel Earing , MD

## 2020-03-24 NOTE — Progress Notes (Signed)
   03/24/20 2142  COVID-19 Daily Checkoff  Have you had a fever (temp > 37.80C/100F)  in the past 24 hours?  No  If you have had runny nose, nasal congestion, sneezing in the past 24 hours, has it worsened? No  COVID-19 EXPOSURE  Have you traveled outside the state in the past 14 days? No  Have you been in contact with someone with a confirmed diagnosis of COVID-19 or PUI in the past 14 days without wearing appropriate PPE? No  Have you been living in the same home as a person with confirmed diagnosis of COVID-19 or a PUI (household contact)? No  Have you been diagnosed with COVID-19? No

## 2020-03-24 NOTE — Progress Notes (Signed)
[facility] Group Notes:  (Nursing/MHT/Case Management/Adjunct)  Date:  03/24/2020  Time:  2030 Type of Therapy:  wrap up group  Participation Level:  Active  Participation Quality:  Appropriate, Attentive, Sharing and Supportive  Affect:  Tearful  Cognitive:  Appropriate  Insight:  Improving  Engagement in Group:  Engaged  Modes of Intervention:  Clarification, Education and Support  Summary of Progress/Problems: Positive thinking and self-care were discussed.  Shellia Cleverly 03/24/2020, 9:30 PM

## 2020-03-24 NOTE — Progress Notes (Signed)
D. Pt presents with an anxious affect/ mood, friendly upon approach. Pt observed in the dayroom interacting well with peers and staff. Per pt's self inventory, pt rated her depression, hopelessness and anxiety a 5/2/8, respectively. Pt reports having slept poorly last night, and endorses low energy level with poor concentration. Pt wrote that her goal today is to "rest". Pt currently denies SI/HI and AVH  A. Labs and vitals monitored. Pt compliant with medications. Pt supported emotionally and encouraged to express concerns and ask questions.   R. Pt remains safe with 15 minute checks. Will continue POC.

## 2020-03-24 NOTE — BHH Group Notes (Signed)
[facility] LCSW Group Therapy Note  Date/Time:  03/24/2020 9:00-10:00 or 10:00-11:00AM  Type of Therapy and Topic:  Group Therapy:  Healthy and Unhealthy Supports  Participation Level:  Active   Description of Group:  Patients in this group were introduced to the idea of adding a variety of healthy supports to address the various needs in their lives.Patients discussed what additional healthy supports could be helpful in their recovery and wellness after discharge in order to prevent future hospitalizations.   An emphasis was placed on using counselor, doctor, therapy groups, 12-step groups, and problem-specific support groups to expand supports.  They also worked as a group on developing a specific plan for several patients to deal with unhealthy supports through [place], psychoeducation with loved ones, and even termination of relationships.   Therapeutic Goals:   1)  discuss importance of adding supports to stay well once out of the hospital  2)  compare healthy versus unhealthy supports and identify some examples of each  3)  generate ideas and descriptions of healthy supports that can be added  4)  offer mutual support about how to address unhealthy supports  5)  encourage active participation in and adherence to discharge plan    Summary of Patient Progress:  The patient stated that current healthy supports in her life are her aunt and "the program" while current unhealthy supports include her ex-partner.  The patient expressed a willingness to add support groups and therapy as support(s) to help in her recovery journey.   Therapeutic Modalities:   Motivational Interviewing Brief Solution-Focused Therapy  Rolanda Jay

## 2020-03-24 NOTE — Consult Note (Addendum)
CONSULT NOTE   [name] [id number] DOB: Aug 07, 1976 DOA: 03/22/2020  PCP: Patient, No Pcp Per   Patient coming from: Behavior Health inpatient    Chief Complaint: Hypokalemia and prolonged QTc interval  HPI: [name] is a 44 y.o. female with medical history significant of asthma, GERD, hypertension, and hepatitis C.  Patient has been hospitalized at behavioral health due to severe recurrent major depression without psychotic features.  Apparently patient had been suffering from diarrhea for about 10 days, large watery bowel movements, daily, predominantly in the morning, with no associated nausea, vomiting or abdominal pain.  No improving or worsening factors.  In the past patient was prescribed potassium supplements while hospitalized.  This morning no bowel movement yet.  Patient has been tolerating p.o. diet with no complications.   Review of Systems:  1. General: No fevers, no chills, no weight gain or weight loss 2. ENT: No runny nose or sore throat, no hearing disturbances 3. Pulmonary: No dyspnea, cough, wheezing, or hemoptysis 4. Cardiovascular: No angina, claudication, lower extremity edema, pnd or orthopnea 5. Gastrointestinal: No nausea or vomiting, positive diarrhea.  6. Hematology: No easy bruisability or frequent infections 7. Urology: No dysuria, hematuria or increased urinary frequency 8. Dermatology: No rashes. 9. Neurology: No seizures or paresthesias 10. Musculoskeletal: No joint pain or deformities  Past Medical History:  Diagnosis Date  . Abnormal Pap smear 1998   had colpo  . Anxiety   . Asthma    ,as child only   no inhaler  . Bicornate uterus   . Bicornuate uterus   . Chlamydia 2000  . GERD (gastroesophageal reflux disease)   . HCV infection    HX of Hep C  . Headache(784.0)   . Hypertension 2010  . IUP (intrauterine pregnancy), incidental 13 weeks  . Opiate dependence ([place])    stopped methadone 01/2011  . PONV (postoperative  nausea and vomiting)   . STD (female)    HX of STD's    Past Surgical History:  Procedure Laterality Date  . BILATERAL SALPINGECTOMY  12/06/2012   Procedure: BILATERAL SALPINGECTOMY;  Surgeon: Mora Bellman, MD;  Location: [facility];  Service: Gynecology;  Laterality: Bilateral;  laparoscopic  . CERVICAL CERCLAGE  09/01/2011   Procedure: CERCLAGE CERVICAL;  Surgeon: Donnamae Jude, MD;  Location: [facility];  Service: Gynecology;  Laterality: N/A;  . CERVICAL CERCLAGE  06/28/2012   Procedure: CERCLAGE CERVICAL;  Surgeon: Donnamae Jude, MD;  Location: [facility];  Service: Gynecology;  Laterality: N/A;  . DILATION AND CURETTAGE OF UTERUS     SAB  . FINGER SURGERY  2003   reattachment of right forefinger  . THERAPEUTIC ABORTION    . THERAPEUTIC ABORTION    . TONSILLECTOMY    . TUBAL LIGATION  11/23/2012     reports that she has been smoking cigarettes. She started smoking about 5 years ago. She has a 10.00 pack-year smoking history. She has never used smokeless tobacco. She reports current alcohol use. She reports that she does not use drugs.  Allergies  Allergen Reactions  . Vicodin [Hydrocodone-Acetaminophen] Nausea And Vomiting    Family History  Problem Relation Age of Onset  . Hypertension Mother   . Mental illness Mother        depression; alot of mental illness.  . Cancer Father 55       Colon cancer age 62  . Thyroid disease Maternal Grandmother   . Diabetes Paternal Grandfather   . Anesthesia problems Neg Hx   .  Other Neg Hx      Prior to Admission medications   Medication Sig Start Date End Date Taking? Authorizing Provider  ALPRAZolam Duanne Moron) 0.5 MG tablet Take 0.5 mg by mouth daily as needed for anxiety (Uses 2-3 times per week for anxiety).   Yes [provider]  lurasidone (LATUDA) 40 MG TABS tablet Take 40 mg by mouth daily with breakfast.   Yes [provider]  OLANZapine (ZYPREXA) 5 MG tablet Take 5 mg by mouth at bedtime.   Yes [provider]   acetaminophen (TYLENOL) 500 MG tablet Take 1,000 mg by mouth every 6 (six) hours as needed for moderate pain or headache.    [provider]  amLODipine (NORVASC) 10 MG tablet Take 1 tablet (10 mg total) by mouth daily. 03/10/20   Robyn Haber, MD  furosemide (LASIX) 20 MG tablet Take 1 tablet (20 mg total) by mouth daily as needed for edema. 02/18/19   Vanessa Kick, MD  ibuprofen (ADVIL,MOTRIN) 200 MG tablet Take 800 mg by mouth every 6 (six) hours as needed for headache or moderate pain.    [provider]  omeprazole (PRILOSEC) 20 MG capsule Take 1 capsule (20 mg total) by mouth daily. 03/10/20   Robyn Haber, MD  venlafaxine XR (EFFEXOR-XR) 150 MG 24 hr capsule Take 1 capsule (150 mg total) by mouth daily with breakfast. 06/06/18   Raylene Everts, MD  losartan-hydrochlorothiazide (HYZAAR) 100-25 MG tablet Take 1 tablet by mouth daily. 06/10/19 01/02/20  Vanessa Kick, MD    Physical Exam: Vitals:   03/23/20 0755 03/23/20 1434 03/24/20 0745 03/24/20 0747  BP: (!) 127/91  (!) 135/100 112/83  Pulse: 83  88   Resp:   16   Temp:   (!) 97.4 F (36.3 C)   TempSrc:   Oral   SpO2:   99%   Weight:  77.1 kg    Height:  5\' 2"  (1.575 m)      Vitals:   03/23/20 0755 03/23/20 1434 03/24/20 0745 03/24/20 0747  BP: (!) 127/91  (!) 135/100 112/83  Pulse: 83  88   Resp:   16   Temp:   (!) 97.4 F (36.3 C)   TempSrc:   Oral   SpO2:   99%   Weight:  77.1 kg    Height:  5\' 2"  (1.575 m)     General: Not in pain or dyspnea  Neurology: Awake and alert, non focal Head and Neck. Head normocephalic. Neck supple with no adenopathy or thyromegaly.   E ENT: mild pallor, no icterus, oral mucosa moist Cardiovascular: No JVD. S1-S2 present, rhythmic, no gallops, rubs, or murmurs. No lower extremity edema. Pulmonary: vesicular breath sounds bilaterally, adequate air movement, no wheezing, rhonchi or rales. Gastrointestinal. Abdomen with no organomegaly, non tender, no rebound or  guarding Skin. No rashes Musculoskeletal: no joint deformities    Labs on Admission: I have personally reviewed following labs and imaging studies  CBC: Recent Labs  Lab 03/23/20 0704  WBC 6.1  HGB 9.7*  HCT 33.2*  MCV 71.1*  PLT [id number]*   Basic Metabolic Panel: Recent Labs  Lab 03/23/20 0704  NA 139  K 2.9*  CL 102  CO2 27  GLUCOSE 110*  BUN 8  CREATININE 0.68  CALCIUM 9.2   GFR: Estimated Creatinine Clearance: 89 mL/min (by C-G formula based on SCr of 0.68 mg/dL). Liver Function Tests: Recent Labs  Lab 03/23/20 0704  AST 26  ALT 28  ALKPHOS 83  BILITOT 0.6  PROT 7.6  ALBUMIN 4.0   No results for input(s): LIPASE, AMYLASE in the last 168 hours. No results for input(s): AMMONIA in the last 168 hours. Coagulation Profile: No results for input(s): INR, PROTIME in the last 168 hours. Cardiac Enzymes: No results for input(s): CKTOTAL, CKMB, CKMBINDEX, TROPONINI in the last 168 hours. BNP (last 3 results) No results for input(s): PROBNP in the last 8760 hours. HbA1C: Recent Labs    03/23/20 0704  HGBA1C 6.1*   CBG: No results for input(s): GLUCAP in the last 168 hours. Lipid Profile: Recent Labs    03/23/20 0704  CHOL 224*  HDL 63  LDLCALC 118*  TRIG 215*  CHOLHDL 3.6   Thyroid Function Tests: Recent Labs    03/23/20 0704  TSH 1.136   Anemia Panel: Recent Labs    03/23/20 1842  VITAMINB12 252  FOLATE 11.2  FERRITIN 4*  TIBC 575*  IRON 26*  RETICCTPCT 1.8   Urine analysis:    Component Value Date/Time   COLORURINE YELLOW (A) 02/04/2019 1352   APPEARANCEUR HAZY (A) 02/04/2019 1352   LABSPEC 1.020 07/11/2019 1158   PHURINE 7.0 07/11/2019 1158   GLUCOSEU NEGATIVE 07/11/2019 1158   HGBUR SMALL (A) 07/11/2019 1158   BILIRUBINUR NEGATIVE 07/11/2019 1158   BILIRUBINUR negative 09/04/2015 1857   BILIRUBINUR Negative 04/15/2015 1357   KETONESUR NEGATIVE 07/11/2019 1158   PROTEINUR 100 (A) 07/11/2019 1158   UROBILINOGEN 0.2 07/11/2019  1158   NITRITE NEGATIVE 07/11/2019 1158   LEUKOCYTESUR SMALL (A) 07/11/2019 1158    Radiological Exams on Admission: No results found.  EKG: Independently reviewed.   81 bpm, normal axis, manually corrected QTc in lead V2 is 449, sinus rhythm, no ST segment or T wave changes.  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  Acute diarrhea with hypokalemia.  Patient's diarrhea seem to be improving.  Patient is tolerating p.o. diet adequately.  Yesterday's potassium was 2.9 and patient received 20 mEq of potassium chloride.  In theory this dose will increase her serum potassium to 3.1.  Will recommend to increase potassium dose to 40 mEq x 2 doses.  Check potassium and magnesium today.  Target potassium of 4.0 and magnesium of 2.0.   2.  Prolonged QTC.  Manually corrected QTc in lead V2, is 449 ms.  Will recommend continue potassium and magnesium correction, to a target of 4.0 and 2.0 respectively.  Follow-up EKG in the morning, to measure QTc in lead V2.  3.  Anemia of iron deficiency.  Hemoglobin 9.7, hematocrit 33.2.  Iron panel with serum iron of 26, TIBC 575, ferritin 4, transferrin saturation of 5, consistent with severe iron deficiency.  Patient denies any melena, menorrhagia or hematuria  Continue oral iron supplementation, follow-up iron stores in 2 weeks, if no improvement consider it IV iron administration.  4. HTN.  Continue blood pressure control with amlodipine.  Will follow patient in the morning to continue electrolyte monitoring and follow-up EKG assessment.     Patient class is not currently inpatient or observation. Update patient class prior to completing documentation (NA)    Rogena Deupree Gerome Apley MD [facility]   03/24/2020, 10:43 AM

## 2020-03-24 NOTE — Progress Notes (Signed)
   03/24/20 2145  Psych Admission Type (Psych Patients Only)  Admission Status Voluntary  Psychosocial Assessment  Patient Complaints Anxiety;Insomnia;Depression  Eye Contact Fair  Facial Expression Flat;Worried  Affect Appropriate to circumstance  Speech Logical/coherent  Interaction Assertive  Motor Activity Other (Comment) (WDL)  Behavior Characteristics Appropriate to situation  Mood Anxious;Depressed;Pleasant  Thought Process  Coherency WDL  Content WDL  Delusions None reported or observed  Perception WDL  Hallucination None reported or observed  Judgment Impaired  Confusion None  Danger to Self  Current suicidal ideation? Denies  Self-Injurious Behavior No self-injurious ideation or behavior indicators observed or expressed   Agreement Not to Harm Self Yes  Description of Agreement verbally contracts for safety  Danger to Others  Danger to Others None reported or observed
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
## 2020-03-25 LAB — BASIC METABOLIC PANEL
Anion gap: 12 (ref 5–15)
BUN: 9 mg/dL (ref 6–20)
CO2: 25 mmol/L (ref 22–32)
Calcium: 10.1 mg/dL (ref 8.9–10.3)
Chloride: 100 mmol/L (ref 98–111)
Creatinine, Ser: 0.59 mg/dL (ref 0.44–1.00)
GFR calc Af Amer: >60 mL/min (ref >60)
GFR calc non Af Amer: >60 mL/min (ref >60)
Glucose, Bld: 109 mg/dL — ABNORMAL HIGH (ref 70–99)
Potassium: 3.4 mmol/L — ABNORMAL LOW (ref 3.5–5.1)
Sodium: 137 mmol/L (ref 135–145)

## 2020-03-25 LAB — CBC
HCT: 36.3 % (ref 36.0–46.0)
Hemoglobin: 10.7 g/dL — ABNORMAL LOW (ref 12.0–15.0)
MCH: 21.1 pg — ABNORMAL LOW (ref 26.0–34.0)
MCHC: 29.5 g/dL — ABNORMAL LOW (ref 30.0–36.0)
MCV: 71.5 fL — ABNORMAL LOW (ref 80.0–100.0)
Platelets: 452 10*3/uL — ABNORMAL HIGH (ref 150–400)
RBC: 5.08 MIL/uL (ref 3.87–5.11)
RDW: 17.5 % — ABNORMAL HIGH (ref 11.5–15.5)
WBC: 7.3 10*3/uL (ref 4.0–10.5)
nRBC: 0 % (ref 0.0–0.2)

## 2020-03-25 LAB — MAGNESIUM: Magnesium: 1.6 mg/dL — ABNORMAL LOW (ref 1.7–2.4)

## 2020-03-25 MED ORDER — POTASSIUM CHLORIDE CRYS ER 20 MEQ PO TBCR
40.0000 meq | EXTENDED_RELEASE_TABLET | ORAL | Status: AC
  Administered 2020-03-25 (×2): 40 meq via ORAL
  Filled 2020-03-25 (×4): qty 2

## 2020-03-25 MED ORDER — VENLAFAXINE HCL ER 75 MG PO CP24
75.0000 mg | ORAL_CAPSULE | Freq: Every day | ORAL | Status: DC
  Administered 2020-03-26: 75 mg via ORAL
  Filled 2020-03-25 (×3): qty 1

## 2020-03-25 MED ORDER — VENLAFAXINE HCL ER 75 MG PO CP24
ORAL_CAPSULE | ORAL | Status: AC
  Administered 2020-03-25: 75 mg via ORAL
  Filled 2020-03-25: qty 1

## 2020-03-25 MED ORDER — MAGNESIUM OXIDE 400 (241.3 MG) MG PO TABS
400.0000 mg | ORAL_TABLET | Freq: Two times a day (BID) | ORAL | Status: DC
  Administered 2020-03-25 – 2020-03-27 (×4): 400 mg via ORAL
  Filled 2020-03-25: qty 14
  Filled 2020-03-25 (×4): qty 1
  Filled 2020-03-25: qty 14
  Filled 2020-03-25 (×3): qty 1

## 2020-03-25 MED ORDER — OLANZAPINE 5 MG PO TABS
5.0000 mg | ORAL_TABLET | Freq: Every day | ORAL | Status: DC
  Administered 2020-03-25: 5 mg via ORAL
  Filled 2020-03-25: qty 1
  Filled 2020-03-25: qty 7
  Filled 2020-03-25 (×2): qty 1

## 2020-03-25 NOTE — B[facility] Group Notes (Signed)
LCSW Group Therapy Note 03/25/2020 2:11 PM  Type of Therapy and Topic: Group Therapy: Overcoming Obstacles  Participation Level: Did Not Attend  Description of Group:  In this group patients will be encouraged to explore what they see as obstacles to their own wellness and recovery. They will be guided to discuss their thoughts, feelings, and behaviors related to these obstacles. The group will process together ways to cope with barriers, with attention given to specific choices patients can make. Each patient will be challenged to identify changes they are motivated to make in order to overcome their obstacles. This group will be process-oriented, with patients participating in exploration of their own experiences as well as giving and receiving support and challenge from other group members.  Therapeutic Goals: 1. Patient will identify personal and current obstacles as they relate to admission. 2. Patient will identify barriers that currently interfere with their wellness or overcoming obstacles.  3. Patient will identify feelings, thought process and behaviors related to these barriers. 4. Patient will identify two changes they are willing to make to overcome these obstacles:   Summary of Patient Progress  Invited, chose not to attend.    Therapeutic Modalities:  Cognitive Behavioral Therapy Solution Focused Therapy Motivational Interviewing Relapse Prevention Therapy   Theresa Duty Clinical Social Worker

## 2020-03-25 NOTE — Progress Notes (Signed)
[facility] MD Progress Note  03/25/2020 9:58 AM [name]  MRN:  [id number] Subjective: Patient reports feeling depressed.  She also describes feeling "weird", vaguely anxious and "twitchy".  She attributes this to venlafaxine withdrawal . Denies suicidal ideations. Objective : I have reviewed chart notes and have met with patient. 44 year old female, presented to hospital voluntarily for worsening depression, suicidal ideations, which were described as passive, neuro-vegetative symptoms of depression. She has a history of substance use disorder ( methamphetamine) . She reported significant losses/stressors over recent years to include death of both her parents, her children's father, and relationship stressors .  Patient presents alert, attentive, oriented x3.  Reports feeling subjectively "weird" and "twitchy".  At this time no abnormal/involuntary movements are noted. No disruptive or agitated behaviors on unit. She presents depressed, constricted, vaguely anxious.  She denies suicidal ideations, contracts for safety on unit. Labs reviewed-BMP now unremarkable with normalized potassium at 4.8.  Of note magnesium was also within normal at 1.8.  Repeat EKG today NSR, ventricular rate 77 bpm ( QT/ QTc 420/475 per above sepsis formula)     Principal Problem: depression Diagnosis: Principal Problem:   Severe recurrent major depression without psychotic features (HCC) Active Problems:   Essential hypertension   MDD (major depressive disorder), recurrent, severe, with psychosis ([place])   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HCC)   Diarrhea   Hypokalemia   Iron deficiency anemia  Total Time spent with patient: 20 minutes  Past Psychiatric History:   Past Medical History:  Past Medical History:  Diagnosis Date  . Abnormal Pap smear 1998   had colpo  . Anxiety   . Asthma    ,as child only   no inhaler  . Bicornate uterus   . Bicornuate uterus   . Chlamydia 2000  . GERD (gastroesophageal reflux  disease)   . HCV infection    HX of Hep C  . Headache(784.0)   . Hypertension 2010  . IUP (intrauterine pregnancy), incidental 13 weeks  . Opiate dependence ([place])    stopped methadone 01/2011  . PONV (postoperative nausea and vomiting)   . STD (female)    HX of STD's    Past Surgical History:  Procedure Laterality Date  . BILATERAL SALPINGECTOMY  12/06/2012   Procedure: BILATERAL SALPINGECTOMY;  Surgeon: Mora Bellman, MD;  Location: [facility];  Service: Gynecology;  Laterality: Bilateral;  laparoscopic  . CERVICAL CERCLAGE  09/01/2011   Procedure: CERCLAGE CERVICAL;  Surgeon: Donnamae Jude, MD;  Location: [facility];  Service: Gynecology;  Laterality: N/A;  . CERVICAL CERCLAGE  06/28/2012   Procedure: CERCLAGE CERVICAL;  Surgeon: Donnamae Jude, MD;  Location: [facility];  Service: Gynecology;  Laterality: N/A;  . DILATION AND CURETTAGE OF UTERUS     SAB  . FINGER SURGERY  2003   reattachment of right forefinger  . THERAPEUTIC ABORTION    . THERAPEUTIC ABORTION    . TONSILLECTOMY    . TUBAL LIGATION  11/23/2012   Family History:  Family History  Problem Relation Age of Onset  . Hypertension Mother   . Mental illness Mother        depression; alot of mental illness.  . Cancer Father 46       Colon cancer age 48  . Thyroid disease Maternal Grandmother   . Diabetes Paternal Grandfather   . Anesthesia problems Neg Hx   . Other Neg Hx    Family Psychiatric  History:  Social History:  Social History   Substance  and Sexual Activity  Alcohol Use Yes   Comment: rarely     Social History   Substance and Sexual Activity  Drug Use No  . Types: Cocaine, IV   Comment: not currently using    Social History   Socioeconomic History  . Marital status: Single    Spouse name: Not on file  . Number of children: Not on file  . Years of education: Not on file  . Highest education level: Not on file  Occupational History  . Not on file  Tobacco Use  . Smoking status: Current Every Day  Smoker    Packs/day: 0.50    Years: 20.00    Pack years: 10.00    Types: Cigarettes    Start date: 05/04/2014  . Smokeless tobacco: Never Used  . Tobacco comment: using the vapor only  Substance and Sexual Activity  . Alcohol use: Yes    Comment: rarely  . Drug use: No    Types: Cocaine, IV    Comment: not currently using  . Sexual activity: Yes    Birth control/protection: Surgical  Other Topics Concern  . Not on file  Social History Narrative   Marital status: single; dating seriously x 3 years in 2016; males only.     Children: 2 children; no grandchildren.     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      Drugs:  Clean 02/14/2013; attends NA every day; sponsor.        Sexual activity:  Total partners = 150.  +chlamydia in 1998.        Seatbelt: 100%      Guns:     Social Determinants of Radio broadcast assistant Strain:   . Difficulty of Paying Living Expenses:   Food Insecurity:   . Worried About Charity fundraiser in the Last Year:   . Arboriculturist in the Last Year:   Transportation Needs:   . Film/video editor (Medical):   Marland Kitchen Lack of Transportation (Non-Medical):   Physical Activity:   . Days of Exercise per Week:   . Minutes of Exercise per Session:   Stress:   . Feeling of Stress :   Social Connections:   . Frequency of Communication with Friends and Family:   . Frequency of Social Gatherings with Friends and Family:   . Attends Religious Services:   . Active Member of Clubs or Organizations:   . Attends Archivist Meetings:   Marland Kitchen Marital Status:    Additional Social History:    Pain Medications: see MAR Prescriptions: see MAR Over the Counter: see MAR  Sleep: Fair  Appetite:  Fair  Current Medications: Current Facility-Administered Medications  Medication Dose Route  Frequency Provider Last Rate Last Admin  . venlafaxine XR (EFFEXOR-XR) 75 MG 24 hr capsule           . alum & mag hydroxide-simeth (MAALOX/MYLANTA) 200-200-20 MG/5ML suspension 30 mL  30 mL Oral Q4H PRN Lindon Romp A, NP      . amLODipine (NORVASC) tablet 10 mg  10 mg Oral Daily Lindon Romp A, NP   10 mg at 03/25/20 0907  . ferrous sulfate tablet 325 mg  325 mg Oral Q breakfast Paolina Karwowski, Myer Peer, MD  325 mg at 03/25/20 0902  . ibuprofen (ADVIL) tablet 400 mg  400 mg Oral Q6H PRN Argelia Formisano, Myer Peer, MD   400 mg at 03/25/20 0432  . LORazepam (ATIVAN) tablet 1 mg  1 mg Oral Q8H PRN Sarabeth Benton, Myer Peer, MD   1 mg at 03/25/20 0432  . magnesium oxide (MAG-OX) tablet 400 mg  400 mg Oral BID Eugenie Filler, MD      . nicotine polacrilex (NICORETTE) gum 2 mg  2 mg Oral PRN Henson Fraticelli, Myer Peer, MD      . OLANZapine (ZYPREXA) tablet 5 mg  5 mg Oral QHS Mirely Pangle A, MD      . pantoprazole (PROTONIX) EC tablet 40 mg  40 mg Oral Daily Lindon Romp A, NP   40 mg at 03/25/20 0903  . venlafaxine XR (EFFEXOR-XR) 24 hr capsule 75 mg  75 mg Oral Q breakfast Jian Hodgman, Myer Peer, MD        Lab Results:  Results for orders placed or performed during the hospital encounter of 03/22/20 (from the past 48 hour(s))  Vitamin B12     Status: None   Collection Time: 03/23/20  6:42 PM  Result Value Ref Range   Vitamin B-12 252 180 - 914 pg/mL    Comment: (NOTE) This assay is not validated for testing neonatal or myeloproliferative syndrome specimens for Vitamin B12 levels. Performed at [facility], [place] [address] [place], [place] [address]   Folate     Status: None   Collection Time: 03/23/20  6:42 PM  Result Value Ref Range   Folate 11.2 >5.9 ng/mL    Comment: Performed at [facility], [place] [address] [place], [place] [address]  Iron and TIBC     Status: Abnormal   Collection Time: 03/23/20  6:42 PM  Result Value Ref Range   Iron 26 (L) 28 - 170 ug/dL   TIBC 575 (H)  250 - 450 ug/dL   Saturation Ratios 5 (L) 10.4 - 31.8 %   UIBC 549 ug/dL    Comment: Performed at [facility], [place] [address], [place], [place] [address]  Ferritin     Status: Abnormal   Collection Time: 03/23/20  6:42 PM  Result Value Ref Range   Ferritin 4 (L) 11 - 307 ng/mL    Comment: Performed at [facility], [place] [address] [place], [place] [address]  Reticulocytes     Status: Abnormal   Collection Time: 03/23/20  6:42 PM  Result Value Ref Range   Retic Ct Pct 1.8 0.4 - 3.1 %   RBC. 4.50 3.87 - 5.11 MIL/uL   Retic Count, Absolute 78.8 19.0 - 186.0 K/uL   Immature Retic Fract 22.0 (H) 2.3 - 15.9 %    Comment: Performed at [facility], [place] [address], [place], [place] [address]  Basic metabolic panel     Status: None   Collection Time: 03/24/20  6:40 PM  Result Value Ref Range   Sodium 138 135 - 145 mmol/L   Potassium 4.8 3.5 - 5.1 mmol/L    Comment: SLIGHT HEMOLYSIS   Chloride 103 98 - 111 mmol/L   CO2 22 22 - 32 mmol/L   Glucose, Bld 91 70 - 99 mg/dL    Comment: Glucose reference range applies only to samples taken after fasting for at least 8 hours.   BUN 7 6 - 20 mg/dL   Creatinine, Ser 0.78 0.44 - 1.00 mg/dL  Calcium 9.8 8.9 - 10.3 mg/dL   GFR calc non Af Amer >60 >60 mL/min   GFR calc Af Amer >60 >60 mL/min   Anion gap 13 5 - 15    Comment: Performed at [facility], [place] [address], [place], [place] [address]  Magnesium     Status: None   Collection Time: 03/24/20  6:40 PM  Result Value Ref Range   Magnesium 1.8 1.7 - 2.4 mg/dL    Comment: Performed at [facility], [place] [address], [place], [place] [address]    Blood Alcohol level:  Lab Results  Component Value Date   ETH 6 (H) 26/37/8588    Metabolic Disorder Labs: Lab Results  Component Value Date   HGBA1C 6.1 (H) 03/23/2020   MPG 128.37 03/23/2020   MPG 117 (H) 09/19/2014   No results found for:  PROLACTIN Lab Results  Component Value Date   CHOL 224 (H) 03/23/2020   TRIG 215 (H) 03/23/2020   HDL 63 03/23/2020   CHOLHDL 3.6 03/23/2020   VLDL 43 (H) 03/23/2020   LDLCALC 118 (H) 03/23/2020   LDLCALC 101 (H) 09/19/2014    Physical Findings: AIMS: Facial and Oral Movements Muscles of Facial Expression: None, normal Lips and Perioral Area: None, normal Jaw: None, normal Tongue: None, normal,Extremity Movements Upper (arms, wrists, hands, fingers): None, normal Lower (legs, knees, ankles, toes): None, normal, Trunk Movements Neck, shoulders, hips: None, normal, Overall Severity Severity of abnormal movements (highest score from questions above): None, normal Incapacitation due to abnormal movements: None, normal Patient's awareness of abnormal movements (rate only patient's report): No Awareness, Dental Status Current problems with teeth and/or dentures?: No Does patient usually wear dentures?: No  CIWA:  CIWA-Ar Total: 2 COWS:  COWS Total Score: 4  Musculoskeletal: Strength & Muscle Tone: within normal limits Gait & Station: normal Patient leans: N/A  Psychiatric Specialty Exam: Physical Exam  Review of Systems no chest pain, no shortness of breath, some nausea, no vomiting, no fever, no chills  Blood pressure (!) 131/99, pulse (!) 101, temperature 97.9 F (36.6 C), resp. rate 18, height 5' 2"  (1.575 m), weight 77.1 kg, last menstrual period 03/10/2020, SpO2 99 %.Body mass index is 31.09 kg/m.  EKG at 9:30 AM-heart rate 77.  General Appearance: Fairly Groomed  Eye Contact:  Good  Speech:  Normal Rate  Volume:  Decreased  Mood: Remains depressed, constricted affect, vaguely anxious  Affect:  Congruent  Thought Process:  Linear and Descriptions of Associations: Intact  Orientation:  Other:  fully alert and attentive   Thought Content:  no hallucinations, no delusions, not internally preoccupied   Suicidal Thoughts:  No currently denies suicidal or self injurious  ideations, denies homicidal or violent ideations  Homicidal Thoughts:  No  Memory:  recent and remote grossly intact   Judgement:   Improving   Insight:  Fair. Improving  Psychomotor Activity: No current psychomotor agitation or restlessness is noted.  Concentration:  Concentration: Good and Attention Span: Good  Recall:  Good  Fund of Knowledge:  Good  Language:  Good  Akathisia:  Negative  Handed:  Right  AIMS (if indicated):     Assets:  Desire for Improvement Resilience  ADL's:  Intact  Cognition:  WNL  Sleep:  Number of Hours: 4.25   Assessment -  44 year old female, presented to hospital voluntarily for worsening depression, suicidal ideations, which were described as passive, neuro-vegetative symptoms of depression. She has a history of substance  use disorder ( methamphetamine) . She reported significant losses/stressors over recent years to include death of both her parents, her children's father, and relationship stressors .  Patient presents depressed, constricted/anxious.  She denies suicidal ideations and contracts for safety on unit.  Describes anxiety and feeling "twitchy"-the symptoms may be related to venlafaxine withdrawal.  She has been managed with venlafaxine for a long period of time but it has been held due to initial hypokalemia and QT prolongation.  Of note no abnormal movements are noted at this time. Potassium now normalized, magnesium also within normal, QTc improved/normalized. We discussed options.  Patient reports she has been on Effexor XR for years and feels that in general this medication has been effective/helpful/well-tolerated.  She also reports Zyprexa was helping anxiety/sleep.  Of note, she describes episodes of mood instability/hypomania but mainly when actively using stimulants.  Will resume Effexor XR at 75 mg daily initially, resume Zyprexa 5 mg nightly  Treatment Plan Summary: Daily contact with patient to assess and evaluate symptoms and  progress in treatment, Medication management, Plan inpatient treatment  and medications as below  Treatment plan reviewed as below today 5/3 Encourage group and milieu participation Encourage efforts to work on Radiographer, therapeutic and symptom reduction Restart Effexor XR at 75 mg daily for depression, anxiety -titrate as tolerated Restart Zyprexa 5 mg nightly for mood disorder Continue Norvasc 10 mgrs QDAY for HTN Continue Protonix 40 mgrs QDAY for GERD symptoms Decrease Ativan to 0.5 mgr Q 6 hours PRN for anxiety or insomnia Start FeSO4 supplement for anemia Treatment team working on disposition planning options  Jenne Campus, MD 03/25/2020, 9:58 AM   Patient ID: [name], female   DOB: 1976-01-23, 44 y.o.   MRN: [id number]

## 2020-03-25 NOTE — Tx Team (Signed)
Interdisciplinary Treatment and Diagnostic Plan Update  03/25/2020 Time of Session: 9:20am Debbie Borntreger MRN: [id number]  Principal Diagnosis: Severe recurrent major depression without psychotic features ([place])  Secondary Diagnoses: Principal Problem:   Severe recurrent major depression without psychotic features ([place]) Active Problems:   Essential hypertension   MDD (major depressive disorder), recurrent, severe, with psychosis ([place])   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[place])   Diarrhea   Hypokalemia   Iron deficiency anemia   Current Medications:  Current Facility-Administered Medications  Medication Dose Route Frequency Provider Last Rate Last Admin  . venlafaxine XR (EFFEXOR-XR) 75 MG 24 hr capsule           . alum & mag hydroxide-simeth (MAALOX/MYLANTA) 200-200-20 MG/5ML suspension 30 mL  30 mL Oral Q4H PRN Lindon Romp A, NP      . amLODipine (NORVASC) tablet 10 mg  10 mg Oral Daily Lindon Romp A, NP   10 mg at 03/25/20 0907  . ferrous sulfate tablet 325 mg  325 mg Oral Q breakfast Cobos, Myer Peer, MD   325 mg at 03/25/20 0902  . ibuprofen (ADVIL) tablet 400 mg  400 mg Oral Q6H PRN Cobos, Myer Peer, MD   400 mg at 03/25/20 0432  . LORazepam (ATIVAN) tablet 1 mg  1 mg Oral Q8H PRN Cobos, Myer Peer, MD   1 mg at 03/25/20 0432  . magnesium oxide (MAG-OX) tablet 400 mg  400 mg Oral BID Eugenie Filler, MD      . nicotine polacrilex (NICORETTE) gum 2 mg  2 mg Oral PRN Cobos, Myer Peer, MD      . OLANZapine (ZYPREXA) tablet 5 mg  5 mg Oral QHS Cobos, Fernando A, MD      . pantoprazole (PROTONIX) EC tablet 40 mg  40 mg Oral Daily Lindon Romp A, NP   40 mg at 03/25/20 0903  . venlafaxine XR (EFFEXOR-XR) 24 hr capsule 75 mg  75 mg Oral Q breakfast Cobos, Myer Peer, MD       PTA Medications: Medications Prior to Admission  Medication Sig Dispense Refill Last Dose  . ALPRAZolam (XANAX) 0.5 MG tablet Take 0.5 mg by mouth daily as needed for anxiety (Uses 2-3 times per week for  anxiety).     Marland Kitchen lurasidone (LATUDA) 40 MG TABS tablet Take 40 mg by mouth daily with breakfast.     . OLANZapine (ZYPREXA) 5 MG tablet Take 5 mg by mouth at bedtime.     Marland Kitchen acetaminophen (TYLENOL) 500 MG tablet Take 1,000 mg by mouth every 6 (six) hours as needed for moderate pain or headache.   Unknown at Unknown time  . amLODipine (NORVASC) 10 MG tablet Take 1 tablet (10 mg total) by mouth daily. 90 tablet 3 Unknown at Unknown time  . furosemide (LASIX) 20 MG tablet Take 1 tablet (20 mg total) by mouth daily as needed for edema. 30 tablet 0   . ibuprofen (ADVIL,MOTRIN) 200 MG tablet Take 800 mg by mouth every 6 (six) hours as needed for headache or moderate pain.   Unknown at Unknown time  . omeprazole (PRILOSEC) 20 MG capsule Take 1 capsule (20 mg total) by mouth daily. 90 capsule 3 Unknown at Unknown time  . venlafaxine XR (EFFEXOR-XR) 150 MG 24 hr capsule Take 1 capsule (150 mg total) by mouth daily with breakfast. 30 capsule 0 Unknown at Unknown time    Patient Stressors:    Patient Strengths: Ability for insight Capable of independent living Agricultural engineer for treatment/growth  Physical Health  Treatment Modalities: Medication Management, Group therapy, Case management,  1 to 1 session with clinician, Psychoeducation, Recreational therapy.   Physician Treatment Plan for Primary Diagnosis: Severe recurrent major depression without psychotic features ([place]) Long Term Goal(s): Improvement in symptoms so as ready for discharge Improvement in symptoms so as ready for discharge   Short Term Goals: Ability to identify changes in lifestyle to reduce recurrence of condition will improve Ability to verbalize feelings will improve Ability to disclose and discuss suicidal ideas Ability to demonstrate self-control will improve Ability to identify and develop effective coping behaviors will improve  Medication Management: Evaluate patient's response, side effects, and  tolerance of medication regimen.  Therapeutic Interventions: 1 to 1 sessions, Unit Group sessions and Medication administration.  Evaluation of Outcomes: Not Met  Physician Treatment Plan for Secondary Diagnosis: Principal Problem:   Severe recurrent major depression without psychotic features ([place]) Active Problems:   Essential hypertension   MDD (major depressive disorder), recurrent, severe, with psychosis ([place])   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[place])   Diarrhea   Hypokalemia   Iron deficiency anemia  Long Term Goal(s): Improvement in symptoms so as ready for discharge Improvement in symptoms so as ready for discharge   Short Term Goals: Ability to identify changes in lifestyle to reduce recurrence of condition will improve Ability to verbalize feelings will improve Ability to disclose and discuss suicidal ideas Ability to demonstrate self-control will improve Ability to identify and develop effective coping behaviors will improve     Medication Management: Evaluate patient's response, side effects, and tolerance of medication regimen.  Therapeutic Interventions: 1 to 1 sessions, Unit Group sessions and Medication administration.  Evaluation of Outcomes: Not Met   RN Treatment Plan for Primary Diagnosis: Severe recurrent major depression without psychotic features ([place]) Long Term Goal(s): Knowledge of disease and therapeutic regimen to maintain health will improve  Short Term Goals: Ability to participate in decision making will improve, Ability to verbalize feelings will improve, Ability to disclose and discuss suicidal ideas, Ability to identify and develop effective coping behaviors will improve and Compliance with prescribed medications will improve  Medication Management: RN will administer medications as ordered by provider, will assess and evaluate patient's response and provide education to patient for prescribed medication. RN will report any adverse and/or side effects to  prescribing provider.  Therapeutic Interventions: 1 on 1 counseling sessions, Psychoeducation, Medication administration, Evaluate responses to treatment, Monitor vital signs and CBGs as ordered, Perform/monitor CIWA, COWS, AIMS and Fall Risk screenings as ordered, Perform wound care treatments as ordered.  Evaluation of Outcomes: Not Met   LCSW Treatment Plan for Primary Diagnosis: Severe recurrent major depression without psychotic features ([place]) Long Term Goal(s): Safe transition to appropriate next level of care at discharge, Engage patient in therapeutic group addressing interpersonal concerns.  Short Term Goals: Engage patient in aftercare planning with referrals and resources  Therapeutic Interventions: Assess for all discharge needs, 1 to 1 time with Social worker, Explore available resources and support systems, Assess for adequacy in community support network, Educate family and significant other(s) on suicide prevention, Complete Psychosocial Assessment, Interpersonal group therapy.  Evaluation of Outcomes: Not Met   Progress in Treatment: Attending groups: Yes. Participating in groups: Yes. Taking medication as prescribed: Yes. Toleration medication: Yes. Family/Significant other contact made: Yes, individual(s) contacted:  the patient's aunt Patient understands diagnosis: Yes. Discussing patient identified problems/goals with staff: Yes. Medical problems stabilized or resolved: Yes. Denies suicidal/homicidal ideation: Yes. Issues/concerns per patient self-inventory: No. Other:  New problem(s) identified: None   New Short Term/Long Term Goal(s): Detox, medication stabilization, elimination of SI thoughts, development of comprehensive mental wellness plan.    Patient Goals: "I want to be happy again and I need some intensive outpatient services when I leave here. I don't want to do the residential thing"    Discharge Plan or Barriers: Patient recently admitted.  Patient expressed interest in intensive outpatient services regarding substance abuse at discharge. CSW will continue to follow and assess for appropriate referrals and possible discharge planning.    Reason for Continuation of Hospitalization: Anxiety Depression Medication stabilization Suicidal ideation  Estimated Length of Stay: 3-5 days   Attendees: Patient: Laikyn Edwards  03/25/2020 10:09 AM  Physician: Dr. Neita Garnet, MD 03/25/2020 10:09 AM  Nursing:  03/25/2020 10:09 AM  RN Care Manager: 03/25/2020 10:09 AM  Social Worker: Radonna Ricker, LCSW 03/25/2020 10:09 AM  Recreational Therapist:  03/25/2020 10:09 AM  Other:  03/25/2020 10:09 AM  Other:  03/25/2020 10:09 AM  Other: 03/25/2020 10:09 AM    Scribe for Treatment Team: Marylee Floras, [place] 03/25/2020 10:09 AM

## 2020-03-25 NOTE — Progress Notes (Signed)
Recreation Therapy Notes  Date:  5.3.21 Time: 0930 Location: [address] Dayroom  Group Topic: Stress Management  Goal Area(s) Addresses:  Patient will identify positive stress management techniques. Patient will identify benefits of using stress management post d/c.  Intervention: Stress Management  Activity: Guided Imagery.  LRT read a script that took patients on a journey to the beach to hear the peaceful waves.  Patients were to listen and follow along as script was read to engage in activity.    Education:  Stress Management, Discharge Planning.   Education Outcome: Acknowledges Education  Clinical Observations/Feedback: Pt did not attend activity.    Victorino Sparrow, LRT/CTRS         Victorino Sparrow A 03/25/2020 12:07 PM

## 2020-03-25 NOTE — Plan of Care (Signed)
Has been in bed. Anxious, restless and worried. Received Ativan at 2125. Denies SI/HI but reports feeling hopeless. Safety precautions reinforced.

## 2020-03-25 NOTE — Progress Notes (Signed)
Chart reviewed.  [facility] hospitalist consulted for patient with history of asthma, GERD, hypertension, hepatitis C who was admitted to behavioral health due to severe recurrent major depression without psychotic features and hospitalist were consulted for hypokalemia and prolonged QTc interval.  Assessment/plan 1.  Hypokalemia/hypomagnesemia Secondary to GI losses secondary to diarrhea which has improved.  Patient's potassium noted to be 2.9.  Potassium repleted and potassium at 4.8 from yesterday evening 03/24/2020.  Repeat potassium this evening at 3.4, and magnesium at 1.6.  Placed on oral magnesium oxide supplementation.  K. Dur 40 mEq p.o every 4 hours x2 doses.  Will need outpatient follow-up post discharge.  Repeat labs in the morning and replete as needed.  2.  Prolonged QTc Repeat EKG ordered this morning however has not crossed into the epic system yet.  Discussed with patient psychiatrist Dr. Sindy Messing who states QTC prolongation has resolved.  Keep potassium greater than 4.  Keep magnesium greater than 2.  Follow.  3.  Iron deficiency anemia Anemia panel consistent with iron deficiency anemia in a menstruating female.  Hemoglobin currently stable at 10.7.  Continue oral iron supplementation.  Follow.  4.  Hypertension Continue current regimen of Norvasc.  Outpatient follow-up.  5.  Major depressive disorder Per primary team.

## 2020-03-25 NOTE — Progress Notes (Signed)
   03/25/20 0906  Vital Signs  Pulse Rate (!) 101  BP (!) 131/99  BP Location Left Arm  BP Method Automatic  Patient Position (if appropriate) Standing  D:  Patient presented with an anxious affect in the morning. Patient rated her anxiety 10/10.  When at the med window, patient stated "..I'm never leaving this place!" Patient had some repetitive motions with her arms, she would pick up the medicine cup and then set it down.  Patient was talking to herself.  Patient reported "crazy body movements."  That her "toes were rolling" and her "breathing was weird." Patient was given 1 mg of ativan.  In the afternoon patient reported that she had the best sleep that she really needed and that her anxiety had decreased. A:  Patient took scheduled medicine.  Support and encouragement provided Routine safety checks conducted every 15 minutes. Patient  Informed to notify staff with any concerns.  Safety maintained. R:  No adverse drug reactions noted.  Patient contracts for safety.  Patient compliant with medication and treatment plan. Patient cooperative and calm in the afternoon. Safety maintained.

## 2020-03-26 DIAGNOSIS — F332 Major depressive disorder, recurrent severe without psychotic features: Principal | ICD-10-CM

## 2020-03-26 LAB — BASIC METABOLIC PANEL
Anion gap: 8 (ref 5–15)
BUN: 8 mg/dL (ref 6–20)
CO2: 25 mmol/L (ref 22–32)
Calcium: 9.6 mg/dL (ref 8.9–10.3)
Chloride: 102 mmol/L (ref 98–111)
Creatinine, Ser: 0.47 mg/dL (ref 0.44–1.00)
GFR calc Af Amer: >60 mL/min (ref >60)
GFR calc non Af Amer: >60 mL/min (ref >60)
Glucose, Bld: 110 mg/dL — ABNORMAL HIGH (ref 70–99)
Potassium: 3.9 mmol/L (ref 3.5–5.1)
Sodium: 135 mmol/L (ref 135–145)

## 2020-03-26 LAB — MAGNESIUM: Magnesium: 1.8 mg/dL (ref 1.7–2.4)

## 2020-03-26 MED ORDER — VENLAFAXINE HCL ER 150 MG PO CP24
150.0000 mg | ORAL_CAPSULE | Freq: Every day | ORAL | Status: DC
  Administered 2020-03-27: 150 mg via ORAL
  Filled 2020-03-26 (×2): qty 1
  Filled 2020-03-26: qty 7

## 2020-03-26 NOTE — Progress Notes (Signed)
   03/26/20 2300  Psych Admission Type (Psych Patients Only)  Admission Status Voluntary  Psychosocial Assessment  Patient Complaints Depression  Eye Contact Fair  Facial Expression Anxious;Flat;Worried  Affect Anxious;Depressed;Preoccupied;Sad  Speech Logical/coherent  Interaction Minimal  Motor Activity Fidgety  Appearance/Hygiene Unremarkable  Behavior Characteristics Cooperative  Mood Depressed  Thought Process  Coherency WDL  Content WDL  Delusions WDL;None reported or observed  Perception WDL  Hallucination None reported or observed  Judgment Poor  Confusion None  Danger to Self  Current suicidal ideation? Denies  Self-Injurious Behavior No self-injurious ideation or behavior indicators observed or expressed   Agreement Not to Harm Self Yes  Description of Agreement verbal  Danger to Others  Danger to Others None reported or observed

## 2020-03-26 NOTE — Progress Notes (Signed)
Chart reviewed.  Electrolyte abnormality of hypokalemia and hypomagnesemia repleted.  Prolonged QTC resolved.  Blood pressure currently stable.  H&H stable.  Nothing further to add at this time.  Will sign off at this time.  Please call with questions.  No charge.

## 2020-03-26 NOTE — Progress Notes (Signed)
   03/26/20 0730  Vital Signs  Temp 98.1 F (36.7 C)  Temp Source Oral  Pulse Rate 89  Pulse Rate Source Dinamap  BP (!) 137/92  BP Location Left Arm  BP Method Automatic  Patient Position (if appropriate) Sitting  D: Patient presented with a depressed and anxious affect.  Patient rated her anxiety and depression 4/10 in the morning. Patient reported that her anxiety and depression increased to 10/10 this afternoon. Patient was given 1 mg of ativan at 1402, patient reported the ativan did not help her this time.  Patient was isolative in her room for most of the day. A:  Patient took scheduled medicine.  Support and encouragement provided Routine safety checks conducted every 15 minutes. Patient  Informed to notify staff with any concerns.   R:  No adverse drug reactions noted.  Patient contracts for safety.  Patient compliant with medication and treatment plan.   Safety maintained.

## 2020-03-26 NOTE — Progress Notes (Addendum)
[facility] MD Progress Note  03/26/2020 10:11 AM [name]  MRN:  [id number] Subjective: Patient reports feeling very uneasy at this time. She describes heightened anxiety, and restlessness, along with periods of interrupted sleep. She states she feels terrible. She is requesting an increase in her Ativan dose at this time, or increase in the freuqency. "ATivan makes me feel better and it helps me.  Could we increase the dose per se?"  Objective : I have reviewed chart notes and have met with patient. 44 year old female, presented to hospital voluntarily for worsening depression, suicidal ideations, which were described as passive, neuro-vegetative symptoms of depression. She has a history of substance use disorder ( methamphetamine) . She reported significant losses/stressors over recent years to include death of both her parents, her children's father, and relationship stressors .  Patient is seen and case discussed with treatment team at this time. She is alert, and oriented. She is attentive, however does not want to get out of bed, noting feeling "terrible"> She reports she has felt this way since stopping her Effexor. She does appear to be anxious, irritable, restless, and sweating. Discussed with patient that her symptoms are consistent with Effexor withdraw symptoms and should begin to resolve now that medications have been resumed.  She is denies any involuntary movements or twitches at this time. She denies suicidal ideations, and is able to contract for safety while on the unit. Per chart review her BMP has stabilized, and her potassium remains at 3.9 as of today, repeat EKG is not available.   Principal Problem: depression Diagnosis: Principal Problem:   Severe recurrent major depression without psychotic features (HCC) Active Problems:   Essential hypertension   MDD (major depressive disorder), recurrent, severe, with psychosis (HCC)   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HCC)   Diarrhea    Hypokalemia   Iron deficiency anemia  Total Time spent with patient: 20 minutes  Past Psychiatric History:   Past Medical History:  Past Medical History:  Diagnosis Date  . Abnormal Pap smear 1998   had colpo  . Anxiety   . Asthma    ,as child only   no inhaler  . Bicornate uterus   . Bicornuate uterus   . Chlamydia 2000  . GERD (gastroesophageal reflux disease)   . HCV infection    HX of Hep C  . Headache(784.0)   . Hypertension 2010  . IUP (intrauterine pregnancy), incidental 13 weeks  . Opiate dependence (HCC)    stopped methadone 01/2011  . PONV (postoperative nausea and vomiting)   . STD (female)    HX of STD's    Past Surgical History:  Procedure Laterality Date  . BILATERAL SALPINGECTOMY  12/06/2012   Procedure: BILATERAL SALPINGECTOMY;  Surgeon: Peggy Constant, MD;  Location: [facility];  Service: Gynecology;  Laterality: Bilateral;  laparoscopic  . CERVICAL CERCLAGE  09/01/2011   Procedure: CERCLAGE CERVICAL;  Surgeon: Tanya S Pratt, MD;  Location: [facility];  Service: Gynecology;  Laterality: N/A;  . CERVICAL CERCLAGE  06/28/2012   Procedure: CERCLAGE CERVICAL;  Surgeon: Tanya S Pratt, MD;  Location: [facility];  Service: Gynecology;  Laterality: N/A;  . DILATION AND CURETTAGE OF UTERUS     SAB  . FINGER SURGERY  2003   reattachment of right forefinger  . THERAPEUTIC ABORTION    . THERAPEUTIC ABORTION    . TONSILLECTOMY    . TUBAL LIGATION  11/23/2012   Family History:  Family History  Problem Relation Age of Onset  .   Hypertension Mother   . Mental illness Mother        depression; alot of mental illness.  . Cancer Father 67       Colon cancer age 38  . Thyroid disease Maternal Grandmother   . Diabetes Paternal Grandfather   . Anesthesia problems Neg Hx   . Other Neg Hx    Family Psychiatric  History:  Social History:  Social History   Substance and Sexual Activity  Alcohol Use Yes   Comment: rarely     Social History   Substance and Sexual Activity   Drug Use No  . Types: Cocaine, IV   Comment: not currently using    Social History   Socioeconomic History  . Marital status: Single    Spouse name: Not on file  . Number of children: Not on file  . Years of education: Not on file  . Highest education level: Not on file  Occupational History  . Not on file  Tobacco Use  . Smoking status: Current Every Day Smoker    Packs/day: 0.50    Years: 20.00    Pack years: 10.00    Types: Cigarettes    Start date: 05/04/2014  . Smokeless tobacco: Never Used  . Tobacco comment: using the vapor only  Substance and Sexual Activity  . Alcohol use: Yes    Comment: rarely  . Drug use: No    Types: Cocaine, IV    Comment: not currently using  . Sexual activity: Yes    Birth control/protection: Surgical  Other Topics Concern  . Not on file  Social History Narrative   Marital status: single; dating seriously x 3 years in 2016; males only.     Children: 2 children; no grandchildren.     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      Drugs:  Clean 02/14/2013; attends NA every day; sponsor.        Sexual activity:  Total partners = 150.  +chlamydia in 1998.        Seatbelt: 100%      Guns:     Social Determinants of Radio broadcast assistant Strain:   . Difficulty of Paying Living Expenses:   Food Insecurity:   . Worried About Charity fundraiser in the Last Year:   . Arboriculturist in the Last Year:   Transportation Needs:   . Film/video editor (Medical):   Marland Kitchen Lack of Transportation (Non-Medical):   Physical Activity:   . Days of Exercise per Week:   . Minutes of Exercise per Session:   Stress:   . Feeling of Stress :   Social Connections:   . Frequency of Communication with Friends and Family:   . Frequency of Social Gatherings with Friends and Family:   .  Attends Religious Services:   . Active Member of Clubs or Organizations:   . Attends Archivist Meetings:   Marland Kitchen Marital Status:    Additional Social History:    Pain Medications: see MAR Prescriptions: see MAR Over the Counter: see MAR  Sleep: Fair  Appetite:  Fair  Current Medications: Current Facility-Administered Medications  Medication Dose Route Frequency Provider Last Rate Last Admin  . alum & mag hydroxide-simeth (MAALOX/MYLANTA) 200-200-20 MG/5ML suspension  30 mL  30 mL Oral Q4H PRN Lindon Romp A, NP      . amLODipine (NORVASC) tablet 10 mg  10 mg Oral Daily Lindon Romp A, NP   10 mg at 03/26/20 0911  . ferrous sulfate tablet 325 mg  325 mg Oral Q breakfast Cobos, Myer Peer, MD   325 mg at 03/26/20 0912  . ibuprofen (ADVIL) tablet 400 mg  400 mg Oral Q6H PRN Cobos, Myer Peer, MD   400 mg at 03/25/20 0432  . LORazepam (ATIVAN) tablet 1 mg  1 mg Oral Q8H PRN Cobos, Myer Peer, MD   1 mg at 03/25/20 2125  . magnesium oxide (MAG-OX) tablet 400 mg  400 mg Oral BID Eugenie Filler, MD   400 mg at 03/26/20 5947  . nicotine polacrilex (NICORETTE) gum 2 mg  2 mg Oral PRN Cobos, Myer Peer, MD      . OLANZapine (ZYPREXA) tablet 5 mg  5 mg Oral QHS Cobos, Myer Peer, MD   5 mg at 03/25/20 2125  . pantoprazole (PROTONIX) EC tablet 40 mg  40 mg Oral Daily Lindon Romp A, NP   40 mg at 03/26/20 0913  . venlafaxine XR (EFFEXOR-XR) 24 hr capsule 75 mg  75 mg Oral Q breakfast Cobos, Myer Peer, MD   75 mg at 03/26/20 0913    Lab Results:  Results for orders placed or performed during the hospital encounter of 03/22/20 (from the past 48 hour(s))  Basic metabolic panel     Status: None   Collection Time: 03/24/20  6:40 PM  Result Value Ref Range   Sodium 138 135 - 145 mmol/L   Potassium 4.8 3.5 - 5.1 mmol/L    Comment: SLIGHT HEMOLYSIS   Chloride 103 98 - 111 mmol/L   CO2 22 22 - 32 mmol/L   Glucose, Bld 91 70 - 99 mg/dL    Comment: Glucose reference range applies only to  samples taken after fasting for at least 8 hours.   BUN 7 6 - 20 mg/dL   Creatinine, Ser 0.78 0.44 - 1.00 mg/dL   Calcium 9.8 8.9 - 10.3 mg/dL   GFR calc non Af Amer >60 >60 mL/min   GFR calc Af Amer >60 >60 mL/min   Anion gap 13 5 - 15    Comment: Performed at [facility], [place] [address] [place], [place] [address]  Magnesium     Status: None   Collection Time: 03/24/20  6:40 PM  Result Value Ref Range   Magnesium 1.8 1.7 - 2.4 mg/dL    Comment: Performed at [facility], [place] [address] [place], [place] [address]  CBC     Status: Abnormal   Collection Time: 03/25/20  6:09 PM  Result Value Ref Range   WBC 7.3 4.0 - 10.5 K/uL   RBC 5.08 3.87 - 5.11 MIL/uL   Hemoglobin 10.7 (L) 12.0 - 15.0 g/dL   HCT 36.3 36.0 - 46.0 %   MCV 71.5 (L) 80.0 - 100.0 fL   MCH 21.1 (L) 26.0 - 34.0 pg   MCHC 29.5 (L) 30.0 - 36.0 g/dL   RDW 17.5 (H) 11.5 - 15.5 %   Platelets 452 (H) 150 - 400 K/uL   nRBC 0.0 0.0 - 0.2 %    Comment: Performed at [facility], [place] [address] [place], [place] [address]  Basic metabolic panel     Status: Abnormal   Collection Time: 03/25/20  6:09 PM  Result Value Ref Range   Sodium 137 135 - 145 mmol/L   Potassium 3.4 (L) 3.5 - 5.1 mmol/L    Comment: DELTA CHECK NOTED   Chloride 100 98 - 111 mmol/L   CO2 25 22 - 32 mmol/L   Glucose, Bld 109 (H) 70 - 99 mg/dL    Comment: Glucose reference range applies only to samples taken after fasting for at least 8 hours.   BUN 9 6 - 20 mg/dL   Creatinine, Ser 0.59 0.44 - 1.00 mg/dL   Calcium 10.1 8.9 - 10.3 mg/dL   GFR calc non Af Amer >60 >60 mL/min   GFR calc Af Amer >60 >60 mL/min   Anion gap 12 5 - 15    Comment: Performed at [facility], [address] [place], [place] [address]  Magnesium     Status: Abnormal   Collection Time: 03/25/20  6:09 PM  Result Value Ref Range   Magnesium 1.6 (L) 1.7 - 2.4 mg/dL    Comment: Performed at [facility], [address] [place], [place] [address]  Basic metabolic panel     Status: Abnormal   Collection Time: 03/26/20  6:27 AM  Result Value Ref Range   Sodium 135 135 - 145 mmol/L   Potassium 3.9 3.5 - 5.1 mmol/L   Chloride 102 98 - 111 mmol/L   CO2 25 22 - 32 mmol/L   Glucose, Bld 110 (H) 70 - 99 mg/dL    Comment: Glucose reference range applies only to samples taken after fasting for at least 8 hours.   BUN 8 6 - 20 mg/dL   Creatinine, Ser 0.47 0.44 - 1.00 mg/dL   Calcium 9.6 8.9 - 10.3 mg/dL   GFR calc non Af Amer >60 >60 mL/min   GFR calc Af Amer >60 >60 mL/min   Anion gap 8 5 - 15    Comment: Performed at [facility], [address] [place], [place] [address]  Magnesium     Status: None   Collection Time: 03/26/20  6:27 AM  Result Value Ref Range   Magnesium 1.8 1.7 - 2.4 mg/dL    Comment: Performed at [facility], [address] [place], [place] [address]    Blood Alcohol level:  Lab Results  Component Value Date   ETH 6 (H) 09/16/2016    Metabolic Disorder Labs: Lab Results  Component Value Date   HGBA1C 6.1 (H) 03/23/2020   MPG 128.37 03/23/2020   MPG 117 (H) 09/19/2014   No results found for: PROLACTIN Lab Results  Component Value Date   CHOL 224 (H) 03/23/2020   TRIG 215 (H) 03/23/2020   HDL 63 03/23/2020   CHOLHDL 3.6 03/23/2020   VLDL 43 (H) 03/23/2020   LDLCALC 118 (H) 03/23/2020   LDLCALC 101 (H) 09/19/2014    Physical Findings: AIMS: Facial and Oral Movements Muscles of Facial Expression: None, normal Lips and Perioral Area: None, normal Jaw: None, normal Tongue: None, normal,Extremity Movements Upper (arms, wrists, hands, fingers): None, normal Lower (legs, knees, ankles, toes): None, normal, Trunk Movements Neck, shoulders, hips: None, normal, Overall Severity Severity of abnormal movements (highest score from questions above): None, normal Incapacitation due to abnormal movements:  None, normal Patient's awareness of abnormal movements (rate only patient's report): No Awareness, Dental Status Current problems with teeth and/or dentures?: No Does patient usually wear dentures?: No  CIWA:  CIWA-Ar Total: 2 COWS:  COWS Total Score: 4    Musculoskeletal: Strength & Muscle Tone: within normal limits Gait & Station: normal Patient leans: N/A  Psychiatric Specialty Exam: Physical Exam   Review of Systems  no chest pain, no shortness of breath, some nausea, no vomiting, no fever, no chills  Blood pressure (!) 137/92, pulse 89, temperature 98.1 F (36.7 C), temperature source Oral, resp. rate 18, height 5' 2" (1.575 m), weight 77.1 kg, last menstrual period 03/10/2020, SpO2 99 %.Body mass index is 31.09 kg/m.  EKG at 9:30 AM-heart rate 77.  General Appearance: Disheveled  Eye Contact:  Fair  Speech:  Clear and Coherent and Normal Rate  Volume:  Normal  Mood: visibly anxious and depressed  Affect:  Appropriate and Depressed  Thought Process:  Coherent, Linear and Descriptions of Associations: Intact  Orientation:  Full (Time, Place, and Person)  Thought Content:  Logical  Suicidal Thoughts:  Denies currently denies suicidal or self injurious ideations, denies homicidal or violent ideations  Homicidal Thoughts:  Denies  Memory:  Immediate;   Good Recent;   Good  Judgement:   Normal  Insight:  Present.   Psychomotor Activity: Restlessness  Concentration:  Concentration: Good and Attention Span: Good  Recall:  Good  Fund of Knowledge:  Good  Language:  Good  Akathisia:  Negative  Handed:  Right  AIMS (if indicated):     Assets:  Desire for Improvement Resilience  ADL's:  Intact  Cognition:  WNL  Sleep:  Number of Hours: 4.25   Assessment -  45 year old female, presented to hospital voluntarily for worsening depression, suicidal ideations, which were described as passive, neuro-vegetative symptoms of depression. She has a history of substance use disorder (  methamphetamine) . She reported significant losses/stressors over recent years to include death of both her parents, her children's father, and relationship stressors .  Patient presents depressed, constricted/anxious.  She denies suicidal ideations and contracts for safety on unit.  She reports ongoing anxiety and sweating related to Effexor withdraw symptoms. Discussed with patient at length the likelihood of this, and the need for a slow taper to reduce these side effects. She is improving otherwise, and has stabilized at this time. Potassium and magnesium are within normal ranges, todays EKG is not available will discuss with nursing staff to obtain at this time. Effexor has been resumed will increase slowly as noted to help decrease side effects. Sleep has improved with use of Zyprexa 2m po daily. Patient requested increase in Ativan however, she is ordered ativan 0.564mpo Q6hr which is appropriate in the setting of chronic substance abuse and will continue to reduce during this stay.   Treatment Plan Summary: Daily contact with patient to assess and evaluate symptoms and progress in treatment, Medication management, Plan inpatient treatment  and medications as below  Treatment plan reviewed as below today 5/4 Encourage group and milieu participation Encourage efforts to work on coping skills and symptom reduction Continue Effexor XR at 75 mg daily for depression, increase dose [id number] tomorrow 05/05 Continue Zyprexa 5 mg nightly for mood disorder Continue Norvasc 10 mgrs QDAY for HTN Continue Protonix 40 mgrs QDAY for GERD symptoms Continue Ativan to 0.5 mgr Q 6 hours PRN for anxiety or insomnia Continue FeSO4 supplement for anemia Treatment team working on disposition planning options  TakSuella BroadNP 03/26/2020, 10:11 AM   Patient ID: ChrJeannene Patellaemale   DOB: [birth year] 28o.   MRN: [id number]

## 2020-03-26 NOTE — Progress Notes (Signed)
Patient had interrupted sleep: Woke up in the middle of night and was still restless. Requested Ginger aile then returned to bed. Currently sleeping. Staff continue to monitor for safety.

## 2020-03-26 NOTE — Progress Notes (Signed)
Recreation Therapy Notes  Animal-Assisted Activity (AAA) Program Checklist/Progress Notes Patient Eligibility Criteria Checklist & Daily Group note for Rec Tx Intervention  Date: 5.4.21 Time: 1430 Location: [facility]   AAA/T Program Assumption of Risk Form signed by Teacher, music or Parent Legal Guardian  YES  Patient is free of allergies or sever asthma  YES   Patient reports no fear of animals  YES   Patient reports no history of cruelty to animals  YES   Patient understands his/her participation is voluntary YES   Patient washes hands before animal contact  YES  Patient washes hands after animal contact  YES   Behavioral Response: Engaged  Education: Contractor, Appropriate Animal Interaction   Education Outcome: Acknowledges understanding/In group clarification offered/Needs additional education.   Clinical Observations/Feedback:  Pt attended and participated in group activity.     Victorino Sparrow, LRT/CTRS         Victorino Sparrow A 03/26/2020 3:15 PM

## 2020-03-26 NOTE — Progress Notes (Signed)
Psychoeducational Group Note  Date:  03/26/2020 Time:  2128  Group Topic/Focus:  Wrap-Up Group:   The focus of this group is to help patients review their daily goal of treatment and discuss progress on daily workbooks.  Participation Level: Did Not Attend  Participation Quality:  Not Applicable  Affect:  Not Applicable  Cognitive:  Not Applicable  Insight:  Not Applicable  Engagement in Group: Not Applicable  Additional Comments:  The patient did not attend group this evening.   Archie Balboa S 03/26/2020, 9:28 PM

## 2020-03-27 DIAGNOSIS — E876 Hypokalemia: Secondary | ICD-10-CM

## 2020-03-27 MED ORDER — HYDROXYZINE HCL 25 MG PO TABS: 25.0000 mg | ORAL_TABLET | Freq: Four times a day (QID) | ORAL | 1 refills | Status: DC | PRN

## 2020-03-27 MED ORDER — AMLODIPINE BESYLATE 10 MG PO TABS: 10.0000 mg | ORAL_TABLET | Freq: Every day | ORAL | 1 refills | Status: DC

## 2020-03-27 MED ORDER — HYDROXYZINE HCL 25 MG PO TABS
25.0000 mg | ORAL_TABLET | Freq: Four times a day (QID) | ORAL | Status: DC | PRN
  Filled 2020-03-27: qty 1

## 2020-03-27 MED ORDER — VENLAFAXINE HCL ER 150 MG PO CP24: 150.0000 mg | ORAL_CAPSULE | Freq: Every day | ORAL | 1 refills | Status: DC

## 2020-03-27 MED ORDER — OLANZAPINE 5 MG PO TABS: 5.0000 mg | ORAL_TABLET | Freq: Every day | ORAL | 1 refills | Status: DC

## 2020-03-27 MED ORDER — PANTOPRAZOLE SODIUM 40 MG PO TBEC: 40.0000 mg | DELAYED_RELEASE_TABLET | Freq: Every day | ORAL | 1 refills | Status: DC

## 2020-03-27 NOTE — Progress Notes (Signed)
  [facility] Adult Case Management Discharge Plan :  Will you be returning to the same living situation after discharge:  Yes,  patient is returning home with her family At discharge, do you have transportation home?: Yes,  patient reports a family member will pick her up Do you have the ability to pay for your medications: No.  Release of information consent forms completed and in the chart;  Patient's signature needed at discharge.  Patient to Follow up at: Follow-up Information    [facility] Follow up on 04/01/2020.   Why: You have an appointment on 04/01/20 at 2:30 pm.  This will be a Virtual appointment.  Please have your insurance information and your discharge summary available. Please contat office for any additional questions or concerns.  Contact information: [place] [phone]        ADS. Go on 04/01/2020.   Why: Please go to this provider during their walk in hours for substance abuse intensive outpatient therapy. Walk in hours are Monday- Friday from 7:00 am - 10:30 am.  Please bring your photo ID and your discharge summary from this hospitalization.  Contact information: [address] [place], [place] [address]  P:  [phone] F:  [facility]. Call.   Why: A referral was made on your behalf. Please be sure to contact this agency on a DAILY basis to determine bed availability. Be sure to have your discharge summary from this hospitalization available upon request.  Contact information: [place] [address] [phone]        [facility], [facility]. Call.   Why: A referral was made on your behalf. Please be sure to contact this agency on a DAILY basis to determine bed availability. Be sure to have your discharge summary from this hospitalization available upon request.  Contact information: [address] [place] [address] [phone]        [facility] Follow up.   Specialty: Addiction Medicine Why: A referral was made on your behalf. Please be sure to contact this agency on a DAILY basis to determine bed availability. Be sure to have your discharge summary from this hospitalization available upon request.  Contact information: [place] [address] [phone]           Next level of care provider has access to [place] and Suicide Prevention discussed: Yes,  with the patient's aunt     Has patient been referred to the Quitline?: Patient refused referral  Patient has been referred for addiction treatment: Yes  Marylee Floras, [place] 03/27/2020, 10:03 AM

## 2020-03-27 NOTE — Progress Notes (Signed)
Discharge Note:   Pt discharged at 11:00 left with family member as her ride. Upon discharge pt is alert and oriented to person, place, time, and situation. Pt was calm, cooperative with the discharge process. Pt was given discharge instructions, discharge prescriptions were sent to the pharmacy for the psych meds for pick up, and pt was given printed prescriptions for medical medications, was given her discharge outpatient follow up appointments; pt verbalized understanding of all. All personal belongings returned to pt upon discharge. Pt voiced on complaints.

## 2020-03-27 NOTE — Progress Notes (Signed)
Recreation Therapy Notes  Date:  5.5.21 Time: 0930 Location: [address] Group Room  Group Topic: Stress Management  Goal Area(s) Addresses:  Patient will identify positive stress management techniques. Patient will identify benefits of using stress management post d/c.  Intervention: Stress Management  Activity :  Progressive Muscle Relaxation.  LRT read a script that guided patients in tensing and relaxing each muscle group individually.  Patients were to listen and follow along as script was read to engage in activity.  Education:  Stress Management, Discharge Planning.   Education Outcome: Acknowledges Education  Clinical Observations/Feedback: Pt did not attend activity.    Victorino Sparrow, LRT/CTRS         Victorino Sparrow A 03/27/2020 12:03 PM

## 2020-03-27 NOTE — BHH Suicide Risk Assessment (Signed)
[facility] Discharge Suicide Risk Assessment   Principal Problem: Severe recurrent major depression without psychotic features ([place]) Discharge Diagnoses: Principal Problem:   Severe recurrent major depression without psychotic features ([place]) Active Problems:   Essential hypertension   MDD (major depressive disorder), recurrent, severe, with psychosis ([place])   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[place])   Diarrhea   Hypokalemia   Iron deficiency anemia   Total Time spent with patient: 20 minutes  Musculoskeletal: Strength & Muscle Tone: within normal limits Gait & Station: normal Patient leans: N/A  Psychiatric Specialty Exam: Review of Systems  All other systems reviewed and are negative.   Blood pressure (!) 130/107, pulse 92, temperature 98.1 F (36.7 C), temperature source Oral, resp. rate 16, height 5\' 2"  (1.575 m), weight 77.1 kg, last menstrual period 03/10/2020, SpO2 99 %.Body mass index is 31.09 kg/m.  General Appearance: Casual  Eye Contact::  Fair  Speech:  Normal Rate409  Volume:  Normal  Mood:  Anxious  Affect:  Congruent  Thought Process:  Coherent and Descriptions of Associations: Intact  Orientation:  Full (Time, Place, and Person)  Thought Content:  Logical  Suicidal Thoughts:  No  Homicidal Thoughts:  No  Memory:  Immediate;   Fair Recent;   Fair Remote;   Fair  Judgement:  Intact  Insight:  Fair  Psychomotor Activity:  Decreased  Concentration:  Fair  Recall:  [employer] of Knowledge:Good  Language: Good  Akathisia:  Negative  Handed:  Right  AIMS (if indicated):     Assets:  Desire for Improvement Housing Resilience  Sleep:  Number of Hours: 6.5  Cognition: WNL  ADL's:  Intact   Mental Status Per Nursing Assessment::   On Admission:  Suicidal ideation indicated by patient  Demographic Factors:  Divorced or widowed, Caucasian, Low socioeconomic status, Living alone and Unemployed  Loss Factors: Financial problems/change in socioeconomic  status  Historical Factors: Impulsivity  Risk Reduction Factors:   NA  Continued Clinical Symptoms:  Depression:   Comorbid alcohol abuse/dependence Impulsivity Alcohol/Substance Abuse/Dependencies  Cognitive Features That Contribute To Risk:  None    Suicide Risk:  Minimal: No identifiable suicidal ideation.  Patients presenting with no risk factors but with morbid ruminations; may be classified as minimal risk based on the severity of the depressive symptoms  Follow-up Information    [facility] Follow up on 04/01/2020.   Why: You have an appointment on 04/01/20 at 2:30 pm.  This will be a Virtual appointment.  Please have your insurance information and your discharge summary available.  Contact information: [place] [phone] [phone]        ADS. Go to.   Why: Please go to this provider's office during their walk in hours.  Walk in hours are Monday through Friday, 7:00 am to 10:30 am.  Please bring your photo ID and your discharge summary from this hospitalization.  Contact information: [address] [place], [place] [address]  P:  [phone] F:  [facility] Follow up.   Contact information: [place] [address] [phone]        [facility] Follow up.   Contact information: [address] [place] [address] [phone]        [facility] Follow up.   Specialty: Addiction Medicine Contact information: [place] [address] [phone]  Plan Of Care/Follow-up recommendations:  Activity:  ad lib  Sharma Covert, MD 03/27/2020, 9:02 AM

## 2020-03-27 NOTE — Discharge Summary (Signed)
Physician Discharge Summary Note  Patient:  [name] is an 44 y.o., female MRN:  [id number] DOB:  05-May-1976 Patient phone:  [phone] (home)  Patient address:   [address] [place] [address],  Total Time spent with patient: 30 minutes  Date of Admission:  03/22/2020 Date of Discharge: 03/27/20  Reason for Admission:  44 year old female, lives alone, unemployed, has 2 adult children. Presentedvoluntarily for worsening depression, suicidal ideations with thoughts of overdosing, Endorses neurovegetative symptoms of depression to include anhedonia, poor sleep, poor appetite, low energy level. Reports has been ruminative related to multiple losses of loved ones ,and explains her mother passed away in 02/22/17, her father in 2018/02/22 and her children's father in 02-23-2019. She also statesshe relapsed on cannabis and methamphetamine recently after long period of sobriety.  Principal Problem: Severe recurrent major depression without psychotic features [facility]) Discharge Diagnoses: Principal Problem:   Severe recurrent major depression without psychotic features ([place]) Active Problems:   Essential hypertension   MDD (major depressive disorder), recurrent, severe, with psychosis ([place])   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[place])   Diarrhea   Hypokalemia   Iron deficiency anemia   Past Psychiatric History: History of bipolar disorder and polysubstance use. She reports history of time periods of several days during periods of sobriety without sleep and with increased energy and activity levels. She was admitted for detox from meth at [facility] last week. Multiple prior hospitalizations. Last hospitalization at [facility] in Feb 23, 2016 for depression and opioid dependence.  Past Medical History:  Past Medical History:  Diagnosis Date  . Abnormal Pap smear 1998   had colpo  . Anxiety   . Asthma    ,as child only   no inhaler  . Bicornate uterus   . Bicornuate uterus   . Chlamydia 02-23-1999  . GERD  (gastroesophageal reflux disease)   . HCV infection    HX of Hep C  . Headache(784.0)   . Hypertension 2009-02-22  . IUP (intrauterine pregnancy), incidental 13 weeks  . Opiate dependence ([place])    stopped methadone February 23, 2011  . PONV (postoperative nausea and vomiting)   . STD (female)    HX of STD's    Past Surgical History:  Procedure Laterality Date  . BILATERAL SALPINGECTOMY  12/06/2012   Procedure: BILATERAL SALPINGECTOMY;  Surgeon: Mora Bellman, MD;  Location: [facility];  Service: Gynecology;  Laterality: Bilateral;  laparoscopic  . CERVICAL CERCLAGE  09/01/2011   Procedure: CERCLAGE CERVICAL;  Surgeon: Donnamae Jude, MD;  Location: [facility];  Service: Gynecology;  Laterality: N/A;  . CERVICAL CERCLAGE  06/28/2012   Procedure: CERCLAGE CERVICAL;  Surgeon: Donnamae Jude, MD;  Location: [facility];  Service: Gynecology;  Laterality: N/A;  . DILATION AND CURETTAGE OF UTERUS     SAB  . FINGER SURGERY  02-22-02   reattachment of right forefinger  . THERAPEUTIC ABORTION    . THERAPEUTIC ABORTION    . TONSILLECTOMY    . TUBAL LIGATION  11/23/2012   Family History:  Family History  Problem Relation Age of Onset  . Hypertension Mother   . Mental illness Mother        depression; alot of mental illness.  . Cancer Father 73       Colon cancer age 33  . Thyroid disease Maternal Grandmother   . Diabetes Paternal Grandfather   . Anesthesia problems Neg Hx   . Other Neg Hx    Family Psychiatric  History: Mother with depression. Aunt with anxiety Social History:  Social History   Substance and Sexual Activity  Alcohol Use Yes   Comment: rarely     Social History   Substance and Sexual Activity  Drug Use No  . Types: Cocaine, IV   Comment: not currently using    Social History   Socioeconomic History  . Marital status: Single    Spouse name: Not on file  . Number of children: Not on file  . Years of education: Not on file  . Highest education level: Not on file  Occupational History  .  Not on file  Tobacco Use  . Smoking status: Current Every Day Smoker    Packs/day: 0.50    Years: 20.00    Pack years: 10.00    Types: Cigarettes    Start date: 05/04/2014  . Smokeless tobacco: Never Used  . Tobacco comment: using the vapor only  Substance and Sexual Activity  . Alcohol use: Yes    Comment: rarely  . Drug use: No    Types: Cocaine, IV    Comment: not currently using  . Sexual activity: Yes    Birth control/protection: Surgical  Other Topics Concern  . Not on file  Social History Narrative   Marital status: single; dating seriously x 3 years in 2016; males only.     Children: 2 children; no grandchildren.     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      Drugs:  Clean 02/14/2013; attends NA every day; sponsor.        Sexual activity:  Total partners = 150.  +chlamydia in 1998.        Seatbelt: 100%      Guns:     Social Determinants of Radio broadcast assistant Strain:   . Difficulty of Paying Living Expenses:   Food Insecurity:   . Worried About Charity fundraiser in the Last Year:   . Arboriculturist in the Last Year:   Transportation Needs:   . Film/video editor (Medical):   Marland Kitchen Lack of Transportation (Non-Medical):   Physical Activity:   . Days of Exercise per Week:   . Minutes of Exercise per Session:   Stress:   . Feeling of Stress :   Social Connections:   . Frequency of Communication with Friends and Family:   . Frequency of Social Gatherings with Friends and Family:   . Attends Religious Services:   . Active Member of Clubs or Organizations:   . Attends Archivist Meetings:   Marland Kitchen Marital Status:     Hospital Course:  Patient remained on the [facility] unit for 4 days. The patient stabilized on medication and therapy. Patient was discharged on Zyprexa 5 mg QHS, Effxor-XR  150 mg Daily, Protonix 40 mg Daily, Norvasc 10 mg Daily, and Vistaril 25 mg TID PRN. Patient has shown improvement with improved mood, affect, sleep, appetite, and interaction. Patient has attended group and participated. Patient has been seen in the day room interacting with peers and staff appropriately. Patient denies any SI/HI/AVH and contracts for safety. Patient agrees to follow up at [facility] and received Reginal Lutes, [facility], and [facility] referrals. Patient is provided with prescriptions for their medications upon discharge.  Physical Findings: AIMS: Facial and Oral Movements Muscles of Facial  Expression: None, normal Lips and Perioral Area: None, normal Jaw: None, normal Tongue: None, normal,Extremity Movements Upper (arms, wrists, hands, fingers): None, normal Lower (legs, knees, ankles, toes): None, normal, Trunk Movements Neck, shoulders, hips: None, normal, Overall Severity Severity of abnormal movements (highest score from questions above): None, normal Incapacitation due to abnormal movements: None, normal Patient's awareness of abnormal movements (rate only patient's report): No Awareness, Dental Status Current problems with teeth and/or dentures?: No Does patient usually wear dentures?: No  CIWA:  CIWA-Ar Total: 2 COWS:  COWS Total Score: 4  Musculoskeletal: Strength & Muscle Tone: within normal limits Gait & Station: normal Patient leans: N/A  Psychiatric Specialty Exam: Physical Exam  Nursing note and vitals reviewed. Constitutional: She is oriented to person, place, and time. She appears well-developed and well-nourished.  Cardiovascular: Normal rate.  Respiratory: Effort normal.  Musculoskeletal:        General: Normal range of motion.  Neurological: She is alert and oriented to person, place, and time.  Skin: Skin is warm.    Review of Systems  Constitutional: Negative.   HENT: Negative.   Eyes: Negative.   Respiratory: Negative.   Cardiovascular: Negative.    Gastrointestinal: Negative.   Genitourinary: Negative.   Musculoskeletal: Negative.   Skin: Negative.   Neurological: Negative.   Psychiatric/Behavioral: Negative.     Blood pressure (!) 130/107, pulse 92, temperature 98.1 F (36.7 C), temperature source Oral, resp. rate 16, height 5\' 2"  (1.575 m), weight 77.1 kg, last menstrual period 03/10/2020, SpO2 99 %.Body mass index is 31.09 kg/m.   General Appearance: Casual  Eye Contact::  Fair  Speech:  Normal Rate409  Volume:  Normal  Mood:  Anxious  Affect:  Congruent  Thought Process:  Coherent and Descriptions of Associations: Intact  Orientation:  Full (Time, Place, and Person)  Thought Content:  Logical  Suicidal Thoughts:  No  Homicidal Thoughts:  No  Memory:  Immediate;   Fair Recent;   Fair Remote;   Fair  Judgement:  Intact  Insight:  Fair  Psychomotor Activity:  Decreased  Concentration:  Fair  Recall:  [place] of Knowledge:Good  Language: Good  Akathisia:  Negative  Handed:  Right  AIMS (if indicated):     Assets:  Desire for Improvement Housing Resilience  Sleep:  Number of Hours: 6.5  Cognition: WNL  ADL's:  Intact      Has this patient used any form of tobacco in the last 30 days? (Cigarettes, Smokeless Tobacco, Cigars, and/or Pipes) Yes, No  Blood Alcohol level:  Lab Results  Component Value Date   ETH 6 (H) 0000000    Metabolic Disorder Labs:  Lab Results  Component Value Date   HGBA1C 6.1 (H) 03/23/2020   MPG 128.37 03/23/2020   MPG 117 (H) 09/19/2014   No results found for: PROLACTIN Lab Results  Component Value Date   CHOL 224 (H) 03/23/2020   TRIG 215 (H) 03/23/2020   HDL 63 03/23/2020   CHOLHDL 3.6 03/23/2020   VLDL 43 (H) 03/23/2020   LDLCALC 118 (H) 03/23/2020   LDLCALC 101 (H) 09/19/2014    See Psychiatric Specialty Exam and Suicide Risk Assessment completed by Attending Physician prior to discharge.  Discharge destination:  Home  Is patient on multiple antipsychotic  therapies at discharge:  No   Has Patient had three or more failed trials of antipsychotic monotherapy by history:  No  Recommended Plan for Multiple Antipsychotic Therapies: NA   Allergies as of 03/27/2020  Reactions   Vicodin [hydrocodone-acetaminophen] Nausea And Vomiting      Medication List    STOP taking these medications   acetaminophen 500 MG tablet Commonly known as: TYLENOL   ALPRAZolam 0.5 MG tablet Commonly known as: XANAX   furosemide 20 MG tablet Commonly known as: LASIX   lurasidone 40 MG Tabs tablet Commonly known as: LATUDA   omeprazole 20 MG capsule Commonly known as: PRILOSEC Replaced by: pantoprazole 40 MG tablet     TAKE these medications     Indication  amLODipine 10 MG tablet Commonly known as: NORVASC Take 1 tablet (10 mg total) by mouth daily.  Indication: High Blood Pressure Disorder   hydrOXYzine 25 MG tablet Commonly known as: ATARAX/VISTARIL Take 1 tablet (25 mg total) by mouth every 6 (six) hours as needed for anxiety.  Indication: Feeling Anxious   ibuprofen 200 MG tablet Commonly known as: ADVIL Take 800 mg by mouth every 6 (six) hours as needed for headache or moderate pain.  Indication: Headache   OLANZapine 5 MG tablet Commonly known as: ZYPREXA Take 1 tablet (5 mg total) by mouth at bedtime.  Indication: Major Depressive Disorder   pantoprazole 40 MG tablet Commonly known as: PROTONIX Take 1 tablet (40 mg total) by mouth daily. Start taking on: Mar 28, 2020 Replaces: omeprazole 20 MG capsule  Indication: Gastroesophageal Reflux Disease   venlafaxine XR 150 MG 24 hr capsule Commonly known as: EFFEXOR-XR Take 1 capsule (150 mg total) by mouth daily with breakfast.  Indication: Major Depressive Disorder      Follow-up Information    [facility] Follow up on 04/01/2020.   Why: You have an appointment on 04/01/20 at 2:30 pm.  This will be a Virtual appointment.  Please have your insurance information and your discharge  summary available.  Contact information: [place] [phone]        ADS. Go to.   Why: Please go to this provider's office during their walk in hours.  Walk in hours are Monday through Friday, 7:00 am to 10:30 am.  Please bring your photo ID and your discharge summary from this hospitalization.  Contact information: [address] [place], [place] [address]  P:  [phone] F:  [facility] Follow up.   Contact information: [place] [address] [phone]        [facility]. Call.   Why: You have been accepted into this program. Please call daily to follow up on bed availability, if you wish to pursue residential substance use treatment. Contact information: [address] [place] [address] [phone]        [facility] Follow up.   Specialty: Addiction Medicine Contact information: [address] [place] [address] [phone]           Follow-up recommendations:  Continue activity as tolerated. Continue diet as recommended by your PCP. Ensure to keep all appointments with outpatient providers.  Comments:  Patient is instructed prior to discharge to: Take all medications as prescribed by his/her mental healthcare provider. Report any adverse effects and or reactions from the medicines to his/her outpatient provider promptly. Patient has been instructed & cautioned: To not engage in alcohol and or illegal drug use while on prescription medicines. In the event of worsening symptoms, patient is instructed to call the crisis hotline, 911 and or go to the nearest  ED for appropriate evaluation and treatment of symptoms. To follow-up with his/her primary care provider for your other medical issues, concerns and or health care needs.    Signed: [place], FNP 03/27/2020, 9:57 AM

## 2020-04-01 DIAGNOSIS — F319 Bipolar disorder, unspecified: Secondary | ICD-10-CM | POA: Insufficient documentation

## 2020-04-01 DIAGNOSIS — F909 Attention-deficit hyperactivity disorder, unspecified type: Secondary | ICD-10-CM | POA: Insufficient documentation

## 2020-04-11 ENCOUNTER — Ambulatory Visit: Payer: Medicaid Other | Admitting: Internal Medicine

## 2020-04-19 ENCOUNTER — Other Ambulatory Visit: Payer: Self-pay

## 2020-04-19 ENCOUNTER — Emergency Department (HOSPITAL_COMMUNITY)
Admission: EM | Admit: 2020-04-19 | Discharge: 2020-04-19 | Discharge status: Absent without leave | Payer: Medicaid Other

## 2020-04-23 ENCOUNTER — Other Ambulatory Visit: Payer: Self-pay

## 2020-04-23 ENCOUNTER — Ambulatory Visit (HOSPITAL_COMMUNITY)
Admission: EM | Admit: 2020-04-23 | Discharge: 2020-04-23 | Disposition: A | Discharge status: Home / Self Care | Payer: Medicaid Other

## 2020-04-23 NOTE — ED Notes (Signed)
Patient called for registration x 3 with no response.

## 2020-04-30 ENCOUNTER — Ambulatory Visit (HOSPITAL_COMMUNITY): Payer: Self-pay | Admitting: Psychiatry

## 2020-04-30 ENCOUNTER — Encounter (HOSPITAL_COMMUNITY): Payer: Self-pay | Admitting: Psychiatry

## 2020-05-18 ENCOUNTER — Ambulatory Visit (HOSPITAL_COMMUNITY)
Admission: EM | Admit: 2020-05-18 | Discharge: 2020-05-18 | Discharge status: Against Medical Advice | Payer: No Payment, Other

## 2020-05-18 ENCOUNTER — Other Ambulatory Visit: Payer: Self-pay

## 2020-05-18 ENCOUNTER — Ambulatory Visit (HOSPITAL_COMMUNITY)
Admission: AD | Admit: 2020-05-18 | Discharge: 2020-05-18 | Disposition: A | Discharge status: Home / Self Care | Payer: No Payment, Other | Attending: Psychiatry | Admitting: Psychiatry

## 2020-05-18 DIAGNOSIS — R45851 Suicidal ideations: Secondary | ICD-10-CM

## 2020-05-18 NOTE — H&P (Addendum)
Behavioral Health Medical Screening Exam  [name] is a 44 y.o. female who presents to [facility] walk in with suicidal ideations with a plan to drive her car into her house.  She states she's felt this way for about one week and cannot state a trigger. Although she cannot name triggers, she tells me she stopped her mental health medications, Zyprexa 5mg  and Venlafaxine XR 150mg , 2 weeks ago and started drinking 1 pint of alcohol daily, which correlates with her mood instability.  She is known to our facility for similar presentations, was inpatient at [facility] from April 30th through May 5th, 2021 similar for MDD without psychosis that was triggered by alcohol usage.  During her stay, she demonstrated symptomatic improvement and was discharged to follow-up with outpatient services for continued care therapy and medication management.  She tells me today, she stopped taking her medications 2 weeks ago because she ran out.  She also states she has not followed up with [facility] although her appointment was scheduled for May 10th because "I had difficulty reaching someone for an appointment".      Total Time spent with patient: 30 minutes  Psychiatric Specialty Exam  Presentation  General Appearance: casual Eye Contact:good Speech: normal rate Speech Volume:normal Handedness:  Mood and Affect  Mood:depressed, anxious Affect: congruent, labile  Thought Process  Thought Processes: coherent,  Descriptions of Associations: intact Orientation: full (time, place and person) Thought Content: illogical, as evidenced by verbalized suicidal intent Hallucinations:denies Ideas of Reference: Suicidal Thoughts:yes, with plan to drive her car into traffic Homicidal Thoughts: denies  Sensorium  Memory: immediate: good; recent: good and remote: good Judgment: fair, in the setting of continued substance use Insight: fair  Executive Functions  Concentration: fair Attention Span: fair Recall:good Fund  of Knowledge: fair Language: good  Psychomotor Activity  Psychomotor Activity:No data recorded  Assets  Assets:  Sleep  Sleep:fair Number of hours: <6 hours    Physical Exam: Physical Exam Cardiovascular:     Rate and Rhythm: Normal rate.  Pulmonary:     Effort: Pulmonary effort is normal.  Musculoskeletal:     Cervical back: Normal range of motion.  Neurological:     Mental Status: She is alert.    Review of Systems  Gastrointestinal: Negative for abdominal pain and vomiting.  Neurological: Negative for tremors.  Psychiatric/Behavioral: Positive for depression, substance abuse and suicidal ideas (with a plan ). Negative for hallucinations and memory loss.   There were no vitals taken for this visit. There is no height or weight on file to calculate BMI.  Musculoskeletal: Strength & Muscle Tone: within normal limits Gait & Station: normal Patient leans: N/A   Recommendations:  Based on my evaluation the patient does not appear to have an emergency medical condition.  As she cannot contract for safety, recommend 24 hour observation at [facility], at which time her medications can be restarted for mood stabilization.  She can be reassessed in the for discharge.    -Complete EKG, Covid 19 labs  -Restart the following medications: Venlafaxine XR 150mg  po daily for depression/mood stability Zyprexa 5mg  po daily for mood stability   Mallie Darting, NP 05/18/2020, 5:06 PM

## 2020-05-18 NOTE — ED Notes (Signed)
Safe transportation called @ 1750 to transport pt to [facility] ED.
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
## 2020-05-18 NOTE — ED Notes (Signed)
Safe transportation arrived @1815 . Pt stated she wanted to be taken to her car and not to [facility] ED. Stated she didn't want to be sitting in the ED waiting room for hrs while waiting for a room. Advised transport that she was not ivc, transport agreed to take pt back to her vehicle at [facility]. Pt stated she still had some BP meds at home and would stabalize herself and then come back to the [facility] for eval. Monitored pt enter safe transportation vehicle for safety.

## 2020-05-19 NOTE — ED Notes (Signed)
Per NP,  pt is to be transferred to [facility] for medical clearance for her elevated BP. Pt stated that she did not want to sit in the ED to wait for a room. "I can just go home and take my blood pressure medicine and stabilize myself." Pt denied SI/HI and denied a plan to harm herself or anyone else. Pt also denied A/VH and any pain. Pt stated that her daughter or her son would be with her and would come pick her up from the ED if she goes there. NP made aware.

## 2020-05-19 NOTE — ED Notes (Addendum)
Pt was contacted this morning 05/19/20 at 0832 for follow-up. Pt stated that her BP was "way down." She also stated that she feels much better but wanted to get more medication because she ran out. Pt stated that she wants to come to the [facility] today, but it will be later because her uncle is having surgery. Pt was encouraged to come in today for an assessment and any prescriptions she may need. Pt denied SI/HI and A/VH.

## 2020-05-20 ENCOUNTER — Emergency Department (HOSPITAL_COMMUNITY)
Admission: EM | Admit: 2020-05-20 | Discharge: 2020-05-21 | Disposition: A | Discharge status: Home / Self Care | Payer: Self-pay | Attending: Emergency Medicine | Admitting: Emergency Medicine

## 2020-05-20 ENCOUNTER — Emergency Department (HOSPITAL_COMMUNITY): Payer: Self-pay

## 2020-05-20 ENCOUNTER — Other Ambulatory Visit: Payer: Self-pay

## 2020-05-20 ENCOUNTER — Encounter (HOSPITAL_COMMUNITY): Payer: Self-pay

## 2020-05-20 DIAGNOSIS — F333 Major depressive disorder, recurrent, severe with psychotic symptoms: Secondary | ICD-10-CM | POA: Insufficient documentation

## 2020-05-20 DIAGNOSIS — F17210 Nicotine dependence, cigarettes, uncomplicated: Secondary | ICD-10-CM | POA: Insufficient documentation

## 2020-05-20 DIAGNOSIS — I10 Essential (primary) hypertension: Secondary | ICD-10-CM | POA: Insufficient documentation

## 2020-05-20 DIAGNOSIS — R109 Unspecified abdominal pain: Secondary | ICD-10-CM

## 2020-05-20 DIAGNOSIS — Z79899 Other long term (current) drug therapy: Secondary | ICD-10-CM | POA: Insufficient documentation

## 2020-05-20 DIAGNOSIS — Z20822 Contact with and (suspected) exposure to covid-19: Secondary | ICD-10-CM | POA: Insufficient documentation

## 2020-05-20 DIAGNOSIS — R0789 Other chest pain: Secondary | ICD-10-CM | POA: Insufficient documentation

## 2020-05-20 DIAGNOSIS — F1910 Other psychoactive substance abuse, uncomplicated: Secondary | ICD-10-CM | POA: Insufficient documentation

## 2020-05-20 DIAGNOSIS — J45909 Unspecified asthma, uncomplicated: Secondary | ICD-10-CM | POA: Insufficient documentation

## 2020-05-20 DIAGNOSIS — R45851 Suicidal ideations: Secondary | ICD-10-CM | POA: Insufficient documentation

## 2020-05-20 DIAGNOSIS — R1013 Epigastric pain: Secondary | ICD-10-CM | POA: Insufficient documentation

## 2020-05-20 LAB — URINALYSIS, ROUTINE W REFLEX MICROSCOPIC
Bilirubin Urine: NEGATIVE
Glucose, UA: 50 mg/dL — AB
Ketones, ur: NEGATIVE mg/dL
Nitrite: NEGATIVE
Protein, ur: 30 mg/dL — AB
Specific Gravity, Urine: 1.005 (ref 1.005–1.030)
WBC, UA: >50 WBC/hpf — ABNORMAL HIGH (ref 0–5)
pH: 7 (ref 5.0–8.0)

## 2020-05-20 LAB — LIPASE, BLOOD: Lipase: 31 U/L (ref 11–51)

## 2020-05-20 LAB — CBC
HCT: 34.2 % — ABNORMAL LOW (ref 36.0–46.0)
Hemoglobin: 9.9 g/dL — ABNORMAL LOW (ref 12.0–15.0)
MCH: 19.7 pg — ABNORMAL LOW (ref 26.0–34.0)
MCHC: 28.9 g/dL — ABNORMAL LOW (ref 30.0–36.0)
MCV: 68.1 fL — ABNORMAL LOW (ref 80.0–100.0)
Platelets: 415 10*3/uL — ABNORMAL HIGH (ref 150–400)
RBC: 5.02 MIL/uL (ref 3.87–5.11)
RDW: 16.7 % — ABNORMAL HIGH (ref 11.5–15.5)
WBC: 13 10*3/uL — ABNORMAL HIGH (ref 4.0–10.5)
nRBC: 0.2 % (ref 0.0–0.2)

## 2020-05-20 LAB — COMPREHENSIVE METABOLIC PANEL
ALT: 117 U/L — ABNORMAL HIGH (ref 0–44)
AST: 255 U/L — ABNORMAL HIGH (ref 15–41)
Albumin: 4.4 g/dL (ref 3.5–5.0)
Alkaline Phosphatase: 118 U/L (ref 38–126)
Anion gap: 12 (ref 5–15)
BUN: 5 mg/dL — ABNORMAL LOW (ref 6–20)
CO2: 26 mmol/L (ref 22–32)
Calcium: 9.5 mg/dL (ref 8.9–10.3)
Chloride: 98 mmol/L (ref 98–111)
Creatinine, Ser: 0.65 mg/dL (ref 0.44–1.00)
GFR calc Af Amer: >60 mL/min (ref >60)
GFR calc non Af Amer: >60 mL/min (ref >60)
Glucose, Bld: 119 mg/dL — ABNORMAL HIGH (ref 70–99)
Potassium: 2.7 mmol/L — CL (ref 3.5–5.1)
Sodium: 136 mmol/L (ref 135–145)
Total Bilirubin: 1.7 mg/dL — ABNORMAL HIGH (ref 0.3–1.2)
Total Protein: 7.9 g/dL (ref 6.5–8.1)

## 2020-05-20 LAB — I-STAT BETA HCG BLOOD, ED (NOT ORDERABLE): I-stat hCG, quantitative: <5 m[IU]/mL (ref <5)

## 2020-05-20 LAB — TROPONIN I (HIGH SENSITIVITY)
Troponin I (High Sensitivity): 13 ng/L (ref <18)
Troponin I (High Sensitivity): 9 ng/L (ref <18)

## 2020-05-20 MED ORDER — FAMOTIDINE IN NACL 20-0.9 MG/50ML-% IV SOLN
20.0000 mg | Freq: Once | INTRAVENOUS | Status: AC
  Administered 2020-05-20: 20 mg via INTRAVENOUS
  Filled 2020-05-20: qty 50

## 2020-05-20 MED ORDER — SODIUM CHLORIDE 0.9% FLUSH
3.0000 mL | Freq: Once | INTRAVENOUS | Status: AC
  Administered 2020-05-20: 3 mL via INTRAVENOUS

## 2020-05-20 MED ORDER — PROCHLORPERAZINE EDISYLATE 10 MG/2ML IJ SOLN
10.0000 mg | Freq: Once | INTRAMUSCULAR | Status: AC
  Administered 2020-05-20: 10 mg via INTRAVENOUS
  Filled 2020-05-20: qty 2

## 2020-05-20 MED ORDER — ONDANSETRON 4 MG PO TBDP
4.0000 mg | ORAL_TABLET | Freq: Once | ORAL | Status: AC | PRN
  Administered 2020-05-20: 4 mg via ORAL
  Filled 2020-05-20: qty 1

## 2020-05-20 MED ORDER — POTASSIUM CHLORIDE 10 MEQ/100ML IV SOLN
10.0000 meq | Freq: Once | INTRAVENOUS | Status: AC
  Administered 2020-05-20: 10 meq via INTRAVENOUS
  Filled 2020-05-20: qty 100

## 2020-05-20 MED ORDER — MAGNESIUM OXIDE 400 (241.3 MG) MG PO TABS
800.0000 mg | ORAL_TABLET | Freq: Once | ORAL | Status: AC
  Administered 2020-05-20: 800 mg via ORAL
  Filled 2020-05-20 (×2): qty 2

## 2020-05-20 MED ORDER — ALUM & MAG HYDROXIDE-SIMETH 200-200-20 MG/5ML PO SUSP
30.0000 mL | Freq: Once | ORAL | Status: AC
  Administered 2020-05-20: 30 mL via ORAL
  Filled 2020-05-20: qty 30

## 2020-05-20 MED ORDER — POTASSIUM CHLORIDE CRYS ER 20 MEQ PO TBCR
40.0000 meq | EXTENDED_RELEASE_TABLET | Freq: Once | ORAL | Status: AC
  Administered 2020-05-20: 40 meq via ORAL
  Filled 2020-05-20: qty 2

## 2020-05-20 MED ORDER — DIPHENHYDRAMINE HCL 50 MG/ML IJ SOLN
25.0000 mg | Freq: Once | INTRAMUSCULAR | Status: AC
  Administered 2020-05-20: 25 mg via INTRAVENOUS
  Filled 2020-05-20: qty 1

## 2020-05-20 MED ORDER — POTASSIUM CHLORIDE 10 MEQ/100ML IV SOLN
10.0000 meq | Freq: Once | INTRAVENOUS | Status: AC
  Administered 2020-05-21: 10 meq via INTRAVENOUS
  Filled 2020-05-20: qty 100

## 2020-05-20 NOTE — BH Assessment (Signed)
Comprehensive Clinical Assessment (CCA) Screening, Triage and Referral Note  05/20/2020 [name] [id number]   Patient presenting to [facility] voluntary due to [place] with plan to "run car into a tree or to put gun in my mouth". Patient reported no access to guns. Patient reported inability to get out of bed and leave her house on most mornings. Patient reported worsening depressive symptoms. Patient reported suicidal thoughts worsens during the evening time.   Patient was seen on 05/18/20 and recommended for overnight observation, patient reported arriving at [facility] and due to elevated BP patient was sent to ED. Patient reported driving herself there and stated there was a 5-7 hour wait, so she went home so she could feel better. Patient was also inpatient 03/22/20 at [facility].   See [facility] Assessment below from 05/18/20.  Disposition: Lindon Romp, NP, patient meets inpatient criteria. TTS to secure placement.   05/18/20 BH ASSESSMENT NOTE: Patient presents as a walk in to [facility] with S/I. Patient voices intent to harm by "driving her car into her house." Patient denies any H/I or AVH. Patient states she was receiving services from [facility] that assisted with medication management for depression and was "unsure what to do when they tried to refer her" to the new facility. Patient states she has been out of her medications for over two weeks and relapsed on alcohol reporting every day use since then stating she consumes "at least a pint of liquor a day" with last use earlier this when she reported she drank "almost a pint." Patient denies any other SA use. Patient denies any withdrawals. Patient states she resides alone with her two children and is unemployed. Patient endorses multiple symptoms of depression to include: anhedonia, poor sleep and low energy level. Patient reports that she has "had a lot of loss in her life" with her mother passing away in 2018, her father in 2019 and her children's father in 2020.  She endorses a long history of depression and is currently prescribed  Effexor XR 150 mg daily although as reported has been off of that medication for two weeks.   Visit Diagnosis:    ICD-10-CM   1. Atypical chest pain  R07.89     Patient Reported Information How did you hear about [place]? Self   Referral name: self   Referral phone number: No data recorded Whom do you see for routine medical problems? Primary Care   Practice/Facility Name: [facility]   Practice/Facility Phone Number: No data recorded  Name of Contact: No data recorded  Contact Number: No data recorded  Contact Fax Number: No data recorded  Prescriber Name: No data recorded  Prescriber Address (if known): No data recorded What Is the Reason for Your Visit/Call Today? Pt is S/I with a plan  How Long Has This Been Causing You Problems? <Week  Have You Recently Been in Any Inpatient Treatment (Hospital/Detox/Crisis Center/28-Day Program)? No   Name/Location of Program/Hospital:n/a   How Long Were You There? n/a   When Were You Discharged? 03/27/20  Have You Ever Received Services From [employer] Before? Yes   Who Do You See at [facility]? Louise  Have You Recently Had Any Thoughts About Hurting Yourself? Yes   Are You Planning to Commit Suicide/Harm Yourself At This time?  Yes  Have you Recently Had Thoughts About Hurting Someone [place] Dawn? No   Explanation: No data recorded Have You Used Any Alcohol or Drugs in the Past 24 Hours? No   How  Long Ago Did You Use Drugs or Alcohol?  0800 (earlier this date)   What Did You Use and How Much? n/a  What Do You Feel Would Help You the Most Today? Other (Comment) (inpatient treatment)  Do You Currently Have a Therapist/Psychiatrist? Yes   Name of Therapist/Psychiatrist: [facility]   Have You Been Recently Discharged From Any Office Practice or Programs? No   Explanation of Discharge From Practice/Program:  No data recorded    CCA Screening  Triage Referral Assessment Type of Contact: Tele-Assessment   Is this Initial or Reassessment? Initial Assessment   Date Telepsych consult ordered in [facility]:  No data recorded  Time Telepsych consult ordered in [facility]:  No data recorded Patient Reported Information Reviewed? Yes   Patient Left Without Being Seen? No data recorded  Reason for Not Completing Assessment: No data recorded Collateral Involvement: none reported  Does Patient Have a [place]? No data recorded  Name and Contact of Legal Guardian:  No data recorded If Minor and Not Living with Parent(s), Who has Custody? n/a  Is CPS involved or ever been involved? Never  Is APS involved or ever been involved? Never  Patient Determined To Be At Risk for Harm To Self or Others Based on Review of Patient Reported Information or Presenting Complaint? Yes, for Self-Harm   Method: No data recorded  Availability of Means: No data recorded  Intent: No data recorded  Notification Required: No data recorded  Additional Information for Danger to Others Potential:  No data recorded  Additional Comments for Danger to Others Potential:  No data recorded  Are There Guns or Other Weapons in Your Home?  No    Types of Guns/Weapons: No data recorded   Are These Weapons Safely Secured?                              No data recorded   Who Could Verify You Are Able To Have These Secured:    No data recorded Do You Have any Outstanding Charges, Pending Court Dates, Parole/Probation? No data recorded Contacted To Inform of Risk of Harm To Self or Others: No data recorded Location of Assessment: [facility] ED  Does Patient Present under Involuntary Commitment? No   IVC Papers Initial File Date: No data recorded  [place] of Residence: [place]  Patient Currently Receiving the Following Services: Medication Management   Determination of Need: Emergent (2 hours)   Options For Referral: Inpatient Hospitalization   Venora Maples, [facility]

## 2020-05-20 NOTE — ED Provider Notes (Signed)
[facility] DEPT Provider Note   CSN: [id number] Arrival date & time: 05/20/20  1503     History Chief Complaint  Patient presents with  . Chest Pain  . Abdominal Pain  . Emesis    [name] is a 44 y.o. female.  44 yo F with a chief complaints of chest pain.  Described as a sharp pain that goes from her epigastrium up to the center of her chest.  This been off and on for about a month now.  Seems to be worse with certain foods and when she drinks alcohol.  She has some shortness of breath when it happens.  Seems to come and go.  Has had some nausea and vomiting over the past couple days.  She also stopped taking her mental health medications about a week ago because she ran out.  She tried to go to behavioral health and then ended up leaving because her blood pressure was elevated.  She still has thoughts of suicide off and on.  Denies overt plan.  The history is provided by the patient.  Chest Pain Pain location:  Substernal area Pain quality: aching, burning and sharp   Pain radiates to:  Neck Pain severity:  Moderate Onset quality:  Gradual Duration:  1 month Timing:  Intermittent Progression:  Waxing and waning Chronicity:  New Relieved by:  Nothing Worsened by:  Nothing Ineffective treatments:  None tried Associated symptoms: abdominal pain, nausea and vomiting   Associated symptoms: no dizziness, no fever, no headache, no palpitations and no shortness of breath   Abdominal Pain Associated symptoms: chest pain, nausea and vomiting   Associated symptoms: no chills, no dysuria, no fever and no shortness of breath   Emesis Associated symptoms: abdominal pain   Associated symptoms: no arthralgias, no chills, no fever, no headaches and no myalgias        Past Medical History:  Diagnosis Date  . Abnormal Pap smear 1998   had colpo  . Anxiety   . Asthma    ,as child only   no inhaler  . Bicornate uterus   . Bicornuate uterus     . Chlamydia 2000  . GERD (gastroesophageal reflux disease)   . HCV infection    HX of Hep C  . Headache(784.0)   . Hypertension 2010  . IUP (intrauterine pregnancy), incidental 13 weeks  . Opiate dependence ([place])    stopped methadone 01/2011  . PONV (postoperative nausea and vomiting)   . STD (female)    HX of STD's    Patient Active Problem List   Diagnosis Date Noted  . Diarrhea 03/24/2020  . Hypokalemia 03/24/2020  . Iron deficiency anemia 03/24/2020  . MDD (major depressive disorder), recurrent, severe, with psychosis ([place]) 03/23/2020  . 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 Severe recurrent major depression without psychotic features ([place]) 03/22/2020  . Adjustment disorder with mixed anxiety and depressed mood 01/10/2020  . Polysubstance abuse ([place]) 01/10/2020  . Gastroesophageal reflux disease 03/08/2017  . Anxiety 03/08/2017  . Anxiety and depression 10/10/2015  . History of intravenous drug use in remission 09/07/2011  . HCV infection   . Essential hypertension   . Bipolar disorder ([place])   . Bicornuate uterus     Past Surgical History:  Procedure Laterality Date  . BILATERAL SALPINGECTOMY  12/06/2012   Procedure: BILATERAL SALPINGECTOMY;  Surgeon: Mora Bellman, MD;  Location: [facility];  Service: Gynecology;  Laterality: Bilateral;  laparoscopic  . CERVICAL CERCLAGE  09/01/2011  Procedure: CERCLAGE CERVICAL;  Surgeon: Donnamae Jude, MD;  Location: [facility];  Service: Gynecology;  Laterality: N/A;  . CERVICAL CERCLAGE  06/28/2012   Procedure: CERCLAGE CERVICAL;  Surgeon: Donnamae Jude, MD;  Location: [facility];  Service: Gynecology;  Laterality: N/A;  . DILATION AND CURETTAGE OF UTERUS     SAB  . FINGER SURGERY  2003   reattachment of right forefinger  . THERAPEUTIC ABORTION    . THERAPEUTIC ABORTION    . TONSILLECTOMY    . TUBAL LIGATION  11/23/2012     OB History    Gravida  7   Para  4   Term  2   Preterm  2   AB  3   Living  2     SAB  1   TAB  2    Ectopic  0   Multiple  0   Live Births  3           Family History  Problem Relation Age of Onset  . Hypertension Mother   . Mental illness Mother        depression; alot of mental illness.  . Cancer Father 26       Colon cancer age 45  . Thyroid disease Maternal Grandmother   . Diabetes Paternal Grandfather   . Anesthesia problems Neg Hx   . Other Neg Hx     Social History   Tobacco Use  . Smoking status: Current Every Day Smoker    Packs/day: 0.50    Years: 20.00    Pack years: 10.00    Types: Cigarettes    Start date: 05/04/2014  . Smokeless tobacco: Never Used  . Tobacco comment: using the vapor only  Vaping Use  . Vaping Use: Some days  . Substances: Nicotine  Substance Use Topics  . Alcohol use: Yes    Comment: rarely  . Drug use: Not Currently    Types: Cocaine, IV    Home Medications Prior to Admission medications   Medication Sig Start Date End Date Taking? Authorizing Provider  amLODipine (NORVASC) 10 MG tablet Take 1 tablet (10 mg total) by mouth daily. 03/27/20  Yes Money, Lowry Ram, FNP  ibuprofen (ADVIL,MOTRIN) 200 MG tablet Take 800 mg by mouth every 6 (six) hours as needed for headache or moderate pain.   Yes [provider]  OLANZapine (ZYPREXA) 5 MG tablet Take 1 tablet (5 mg total) by mouth at bedtime. 03/27/20  Yes Money, Lowry Ram, FNP  pantoprazole (PROTONIX) 40 MG tablet Take 1 tablet (40 mg total) by mouth daily. 03/28/20  Yes Money, Lowry Ram, FNP  venlafaxine XR (EFFEXOR-XR) 150 MG 24 hr capsule Take 1 capsule (150 mg total) by mouth daily with breakfast. 03/27/20  Yes Money, Lowry Ram, FNP  hydrOXYzine (ATARAX/VISTARIL) 25 MG tablet Take 1 tablet (25 mg total) by mouth every 6 (six) hours as needed for anxiety. Patient not taking: Reported on 05/20/2020 03/27/20   Money, Lowry Ram, FNP  losartan-hydrochlorothiazide (HYZAAR) 100-25 MG tablet Take 1 tablet by mouth daily. 06/10/19 01/02/20  Vanessa Kick, MD    Allergies    Patient has no  known allergies.  Review of Systems   Review of Systems  Constitutional: Negative for chills and fever.  HENT: Negative for congestion and rhinorrhea.   Eyes: Negative for redness and visual disturbance.  Respiratory: Negative for shortness of breath and wheezing.   Cardiovascular: Positive for chest pain. Negative for palpitations.  Gastrointestinal: Positive for  abdominal pain, nausea and vomiting.  Genitourinary: Negative for dysuria and urgency.  Musculoskeletal: Negative for arthralgias and myalgias.  Skin: Negative for pallor and wound.  Neurological: Negative for dizziness and headaches.    Physical Exam Updated Vital Signs BP (!) 146/111 (BP Location: Left Arm)   Pulse 96   Temp 98.1 F (36.7 C) (Oral)   Resp 18   Ht 5\' 1"  (1.549 m)   Wt 74.8 kg   LMP 04/29/2020 (Approximate) Comment: tubal ligation  SpO2 97%   BMI 31.18 kg/m   Physical Exam Vitals and nursing note reviewed.  Constitutional:      General: She is not in acute distress.    Appearance: She is well-developed. She is not diaphoretic.  HENT:     Head: Normocephalic and atraumatic.  Eyes:     Pupils: Pupils are equal, round, and reactive to light.  Cardiovascular:     Rate and Rhythm: Normal rate and regular rhythm.     Heart sounds: No murmur heard.  No friction rub. No gallop.   Pulmonary:     Effort: Pulmonary effort is normal.     Breath sounds: No wheezing or rales.  Abdominal:     General: There is no distension.     Palpations: Abdomen is soft.     Tenderness: There is abdominal tenderness.     Comments: Diffuse tenderness about the upper abdomen not significantly worse in the right upper quadrant.  Negative Murphy sign.  Musculoskeletal:        General: No tenderness.     Cervical back: Normal range of motion and neck supple.  Skin:    General: Skin is warm and dry.  Neurological:     Mental Status: She is alert and oriented to person, place, and time.  Psychiatric:        Behavior:  Behavior normal.     ED Results / Procedures / Treatments   Labs (all labs ordered are listed, but only abnormal results are displayed) Labs Reviewed  COMPREHENSIVE METABOLIC PANEL - Abnormal; Notable for the following components:      Result Value   Potassium 2.7 (*)    Glucose, Bld 119 (*)    BUN 5 (*)    AST 255 (*)    ALT 117 (*)    Total Bilirubin 1.7 (*)    All other components within normal limits  CBC - Abnormal; Notable for the following components:   WBC 13.0 (*)    Hemoglobin 9.9 (*)    HCT 34.2 (*)    MCV 68.1 (*)    MCH 19.7 (*)    MCHC 28.9 (*)    RDW 16.7 (*)    Platelets 415 (*)    All other components within normal limits  URINALYSIS, ROUTINE W REFLEX MICROSCOPIC - Abnormal; Notable for the following components:   APPearance CLOUDY (*)    Glucose, UA 50 (*)    Hgb urine dipstick LARGE (*)    Protein, ur 30 (*)    Leukocytes,Ua LARGE (*)    WBC, UA >50 (*)    Bacteria, UA RARE (*)    Crystals PRESENT (*)    All other components within normal limits  LIPASE, BLOOD  I-STAT BETA HCG BLOOD, ED ([facility], [facility], [facility] ONLY)  I-STAT BETA HCG BLOOD, ED (NOT ORDERABLE)  TROPONIN I (HIGH SENSITIVITY)  TROPONIN I (HIGH SENSITIVITY)    EKG EKG Interpretation  Date/Time:  Monday May 20 2020 20:41:57 EDT Ventricular Rate:  101  PR Interval:    QRS Duration: 99 QT Interval:  411 QTC Calculation: 533 R Axis:   -31 Text Interpretation: Sinus tachycardia Left axis deviation Abnormal R-wave progression, late transition Prolonged QT interval No significant change since last tracing Confirmed by Deno Etienne [phone]) on 05/20/2020 8:47:33 PM   Radiology DG Chest 2 View  Result Date: 05/20/2020 CLINICAL DATA:  Chest pain epigastric pain radiating into the back. Vomiting. EXAM: CHEST - 2 VIEW COMPARISON:  11/16/2013 FINDINGS: Cardiomediastinal contours and hilar structures are normal. Trachea is midline. Lungs are clear. No sign of pleural effusion. On limited assessment  visualized skeletal structures without acute process. IMPRESSION: No acute cardiopulmonary disease. Electronically Signed   By: Zetta Bills M.D.   On: 05/20/2020 16:17    Procedures Procedures (including critical care time)  Medications Ordered in ED Medications  famotidine (PEPCID) IVPB 20 mg premix (has no administration in time range)  sodium chloride flush (NS) 0.9 % injection 3 mL (3 mLs Intravenous Given 05/20/20 2224)  ondansetron (ZOFRAN-ODT) disintegrating tablet 4 mg (4 mg Oral Given 05/20/20 1535)  alum & mag hydroxide-simeth (MAALOX/MYLANTA) 200-200-20 MG/5ML suspension 30 mL (30 mLs Oral Given 05/20/20 2104)  potassium chloride SA (KLOR-CON) CR tablet 40 mEq (40 mEq Oral Given 05/20/20 2104)  magnesium oxide (MAG-OX) tablet 800 mg (800 mg Oral Given 05/20/20 2104)  potassium chloride 10 mEq in 100 mL IVPB (10 mEq Intravenous New Bag/Given 05/20/20 2108)  prochlorperazine (COMPAZINE) injection 10 mg (10 mg Intravenous Given 05/20/20 2223)  diphenhydrAMINE (BENADRYL) injection 25 mg (25 mg Intravenous Given 05/20/20 2222)    ED Course  I have reviewed the triage vital signs and the nursing notes.  Pertinent labs & imaging results that were available during my care of the patient were reviewed by me and considered in my medical decision making (see chart for details).    MDM Rules/Calculators/A&P                          44 yo F with a chief complaints of epigastric abdominal pain.  This is been off and on for the past month.  Usually worse with certain foods and drinking alcohol.  By history this is most likely reflux disease.  She has an initial negative troponin chest x-ray viewed by me without focal history of pneumothorax.  She does have mild LFT elevation which is different from about a month ago.  Mild leukocytosis which could be due to vomiting.  We will give the patient an oral trial.  GI cocktail.  EKG.  Her potassium is low at 2.7.  Delta troponin is negative.  I feel the  patient is medically clear.  TTS evaluation.   Final Clinical Impression(s) / ED Diagnoses Final diagnoses:  Atypical chest pain    Rx / DC Orders ED Discharge Orders    None       Deno Etienne, DO 05/20/20 2224

## 2020-05-20 NOTE — ED Notes (Signed)
Date and time results received: 05/20/20 6:58 PM (use smartphrase ".now" to insert current time)  Test: Potassium Critical Value: 2.7  Name of Provider Notified: Marcella Dubs RN  Orders Received? Or Actions Taken?: Orders Received - See Orders for details

## 2020-05-20 NOTE — ED Triage Notes (Addendum)
Patient c/o epigastric pain that radiates into the back. Patient is actively vomiting in triage.  Patient states she is not suicidal, but is feeling worthless. patient states she was at [facility] 3 days ago and was suppose to go the [place], but went home instead. Patient states she has been off of her psych meds x 2 weeks

## 2020-05-20 NOTE — ED Notes (Signed)
TTS Assessment complete. Lindon Romp, NP, patient meets inpatient criteria. TTS to secure placement.

## 2020-05-21 ENCOUNTER — Emergency Department (HOSPITAL_COMMUNITY): Payer: Self-pay

## 2020-05-21 ENCOUNTER — Other Ambulatory Visit: Payer: Self-pay | Admitting: Internal Medicine

## 2020-05-21 ENCOUNTER — Ambulatory Visit: Payer: Self-pay | Attending: Internal Medicine | Admitting: Internal Medicine

## 2020-05-21 ENCOUNTER — Encounter: Payer: Self-pay | Admitting: Internal Medicine

## 2020-05-21 ENCOUNTER — Encounter: Payer: Self-pay | Admitting: Licensed Clinical Social Worker

## 2020-05-21 VITALS — BP 134/90 | HR 91 | Temp 98.1°F | Resp 16 | Wt 166.0 lb

## 2020-05-21 DIAGNOSIS — R7303 Prediabetes: Secondary | ICD-10-CM

## 2020-05-21 DIAGNOSIS — F419 Anxiety disorder, unspecified: Secondary | ICD-10-CM

## 2020-05-21 DIAGNOSIS — N3000 Acute cystitis without hematuria: Secondary | ICD-10-CM

## 2020-05-21 DIAGNOSIS — F5089 Other specified eating disorder: Secondary | ICD-10-CM

## 2020-05-21 DIAGNOSIS — F1020 Alcohol dependence, uncomplicated: Secondary | ICD-10-CM

## 2020-05-21 DIAGNOSIS — K219 Gastro-esophageal reflux disease without esophagitis: Secondary | ICD-10-CM

## 2020-05-21 DIAGNOSIS — F332 Major depressive disorder, recurrent severe without psychotic features: Secondary | ICD-10-CM

## 2020-05-21 DIAGNOSIS — K7010 Alcoholic hepatitis without ascites: Secondary | ICD-10-CM

## 2020-05-21 DIAGNOSIS — F3130 Bipolar disorder, current episode depressed, mild or moderate severity, unspecified: Secondary | ICD-10-CM

## 2020-05-21 DIAGNOSIS — D508 Other iron deficiency anemias: Secondary | ICD-10-CM

## 2020-05-21 DIAGNOSIS — I10 Essential (primary) hypertension: Secondary | ICD-10-CM

## 2020-05-21 LAB — SARS CORONAVIRUS 2 BY RT PCR (HOSPITAL ORDER, PERFORMED IN ~~LOC~~ HOSPITAL LAB): SARS Coronavirus 2: NEGATIVE

## 2020-05-21 MED ORDER — AMLODIPINE BESYLATE 10 MG PO TABS: 10.0000 mg | ORAL_TABLET | Freq: Every day | ORAL | 6 refills | Status: DC

## 2020-05-21 MED ORDER — METFORMIN HCL 500 MG PO TABS: 500.0000 mg | ORAL_TABLET | Freq: Every day | ORAL | 3 refills | Status: DC

## 2020-05-21 MED ORDER — DICYCLOMINE HCL 10 MG PO CAPS
10.0000 mg | ORAL_CAPSULE | Freq: Once | ORAL | Status: AC
  Administered 2020-05-21: 10 mg via ORAL
  Filled 2020-05-21: qty 1

## 2020-05-21 MED ORDER — SUCRALFATE 1 G PO TABS
1.0000 g | ORAL_TABLET | Freq: Three times a day (TID) | ORAL | 1 refills | Status: DC
Start: 2020-05-21 — End: 2020-05-21

## 2020-05-21 MED ORDER — FERROUS SULFATE 325 (65 FE) MG PO TABS: 325.0000 mg | ORAL_TABLET | Freq: Every day | ORAL | 1 refills | Status: DC

## 2020-05-21 MED ORDER — VENLAFAXINE HCL ER 150 MG PO CP24: 150.0000 mg | ORAL_CAPSULE | Freq: Every day | ORAL | 1 refills | Status: DC

## 2020-05-21 MED ORDER — PANTOPRAZOLE SODIUM 40 MG PO TBEC: 40.0000 mg | DELAYED_RELEASE_TABLET | Freq: Every day | ORAL | 4 refills | Status: DC

## 2020-05-21 MED ORDER — CIPROFLOXACIN HCL 500 MG PO TABS: 500.0000 mg | ORAL_TABLET | Freq: Two times a day (BID) | ORAL | 0 refills | Status: DC

## 2020-05-21 MED ORDER — OLANZAPINE 5 MG PO TABS: 5.0000 mg | ORAL_TABLET | Freq: Every day | ORAL | 1 refills | Status: DC

## 2020-05-21 MED ORDER — SENNOSIDES-DOCUSATE SODIUM 8.6-50 MG PO TABS: 1.0000 | ORAL_TABLET | Freq: Every day | ORAL | 1 refills | Status: DC | PRN

## 2020-05-21 MED ORDER — SUCRALFATE 1 G PO TABS
1.0000 g | ORAL_TABLET | Freq: Four times a day (QID) | ORAL | Status: DC | PRN
  Administered 2020-05-21: 1 g via ORAL
  Filled 2020-05-21: qty 1

## 2020-05-21 MED FILL — METFORMIN HCL 500 MG TABS: 500 | 30 days supply | Qty: 30 | Fill #0

## 2020-05-21 MED FILL — PANTOPRAZOLE SOD DR 40 MG T: 40 | 30 days supply | Qty: 30 | Fill #0

## 2020-05-21 MED FILL — CIPROFLOXACIN HCL 500 MG TA: 500 | 7 days supply | Qty: 14 | Fill #0

## 2020-05-21 MED FILL — OLANZapine 5 MG TABS: 5 | 30 days supply | Qty: 30 | Fill #0

## 2020-05-21 MED FILL — AMLODIPINE BESYLATE 10 MG T: 10 | 30 days supply | Qty: 30 | Fill #0

## 2020-05-21 MED FILL — VENLAFAXINE HCL ER 150 MG C: 150 | 30 days supply | Qty: 30 | Fill #0

## 2020-05-21 NOTE — ED Notes (Signed)
Patient given ice chips. 

## 2020-05-21 NOTE — ED Notes (Signed)
Patient pulling off cardiac monitoring and verbalized "I cant believe you are sending me home like this my stomach has been hurting all day." This nurse asked patient why she felt like we were sending her home, she replies "I don't know."  ED provider made aware patient requesting something else for her abdominal pain.

## 2020-05-21 NOTE — BH Assessment (Signed)
Patient seen by this counselor and Dr. Dwyane Dee. Came to [facility] because "depression is out of control." Feeling worthless, suicidal ideation increased. Lives alone. Experienced the death of her mother, father, and spouse in the past few years. "I would really like to get back on my medicine."

## 2020-05-21 NOTE — ED Notes (Addendum)
Pt belongings in belonging bag in 16-18 cab.   Patient given meal tray. Patient feeding self. Patient calm and cooperative.

## 2020-05-21 NOTE — Patient Instructions (Signed)
I have given a refill on Zyprexa and Effexor until you get in with the psychiatrist.  Stop the iron supplement.  I have also written the prescription for stool softener called Senokot-S to help prevent constipation.  You should stop eating ice.  For your blood pressure we have restarted the medication called amlodipine.  You have prediabetes.  We have started you on a medication called Metformin.   Prediabetes Eating Plan Prediabetes is a condition that causes blood sugar (glucose) levels to be higher than normal. This increases the risk for developing diabetes. In order to prevent diabetes from developing, your health care provider may recommend a diet and other lifestyle changes to help you:  Control your blood glucose levels.  Improve your cholesterol levels.  Manage your blood pressure. Your health care provider may recommend working with a diet and nutrition specialist (dietitian) to make a meal plan that is best for you. What are tips for following this plan? Lifestyle  Set weight loss goals with the help of your health care team. It is recommended that most people with prediabetes lose 7% of their current body weight.  Exercise for at least 30 minutes at least 5 days a week.  Attend a support group or seek ongoing support from a mental health counselor.  Take over-the-counter and prescription medicines only as told by your health care provider. Reading food labels  Read food labels to check the amount of fat, salt (sodium), and sugar in prepackaged foods. Avoid foods that have: ? Saturated fats. ? Trans fats. ? Added sugars.  Avoid foods that have more than 300 milligrams (mg) of sodium per serving. Limit your daily sodium intake to less than 2,300 mg each day. Shopping  Avoid buying pre-made and processed foods. Cooking  Cook with olive oil. Do not use butter, lard, or ghee.  Bake, broil, grill, or boil foods. Avoid frying. Meal planning   Work with your  dietitian to develop an eating plan that is right for you. This may include: ? Tracking how many calories you take in. Use a food diary, notebook, or mobile application to track what you eat at each meal. ? Using the glycemic index (GI) to plan your meals. The index tells you how quickly a food will raise your blood glucose. Choose low-GI foods. These foods take a longer time to raise blood glucose.  Consider following a Mediterranean diet. This diet includes: ? Several servings each day of fresh fruits and vegetables. ? Eating fish at least twice a week. ? Several servings each day of whole grains, beans, nuts, and seeds. ? Using olive oil instead of other fats. ? Moderate alcohol consumption. ? Eating small amounts of red meat and whole-fat dairy.  If you have high blood pressure, you may need to limit your sodium intake or follow a diet such as the DASH eating plan. DASH is an eating plan that aims to lower high blood pressure. What foods are recommended? The items listed below may not be a complete list. Talk with your dietitian about what dietary choices are best for you. Grains Whole grains, such as whole-wheat or whole-grain breads, crackers, cereals, and pasta. Unsweetened oatmeal. Bulgur. Barley. Quinoa. Brown rice. Corn or whole-wheat flour tortillas or taco shells. Vegetables Lettuce. Spinach. Peas. Beets. Cauliflower. Cabbage. Broccoli. Carrots. Tomatoes. Squash. Eggplant. Herbs. Peppers. Onions. Cucumbers. Brussels sprouts. Fruits Berries. Bananas. Apples. Oranges. Grapes. Papaya. Mango. Pomegranate. Kiwi. Grapefruit. Cherries. Meats and other protein foods Seafood. Poultry without skin. Lean cuts of  pork and beef. Tofu. Eggs. Nuts. Beans. Dairy Low-fat or fat-free dairy products, such as yogurt, cottage cheese, and cheese. Beverages Water. Tea. Coffee. Sugar-free or diet soda. Seltzer water. Lowfat or no-fat milk. Milk alternatives, such as soy or almond milk. Fats and  oils Olive oil. Canola oil. Sunflower oil. Grapeseed oil. Avocado. Walnuts. Sweets and desserts Sugar-free or low-fat pudding. Sugar-free or low-fat ice cream and other frozen treats. Seasoning and other foods Herbs. Sodium-free spices. Mustard. Relish. Low-fat, low-sugar ketchup. Low-fat, low-sugar barbecue sauce. Low-fat or fat-free mayonnaise. What foods are not recommended? The items listed below may not be a complete list. Talk with your dietitian about what dietary choices are best for you. Grains Refined white flour and flour products, such as bread, pasta, snack foods, and cereals. Vegetables Canned vegetables. Frozen vegetables with butter or cream sauce. Fruits Fruits canned with syrup. Meats and other protein foods Fatty cuts of meat. Poultry with skin. Breaded or fried meat. Processed meats. Dairy Full-fat yogurt, cheese, or milk. Beverages Sweetened drinks, such as sweet iced tea and soda. Fats and oils Butter. Lard. Ghee. Sweets and desserts Baked goods, such as cake, cupcakes, pastries, cookies, and cheesecake. Seasoning and other foods Spice mixes with added salt. Ketchup. Barbecue sauce. Mayonnaise. Summary  To prevent diabetes from developing, you may need to make diet and other lifestyle changes to help control blood sugar, improve cholesterol levels, and manage your blood pressure.  Set weight loss goals with the help of your health care team. It is recommended that most people with prediabetes lose 7 percent of their current body weight.  Consider following a Mediterranean diet that includes plenty of fresh fruits and vegetables, whole grains, beans, nuts, seeds, fish, lean meat, low-fat dairy, and healthy oils. This information is not intended to replace advice given to you by your health care provider. Make sure you discuss any questions you have with your health care provider. Document Revised: 03/03/2019 Document Reviewed: 01/13/2017 Elsevier Patient  Education  2020 [employer].

## 2020-05-21 NOTE — Progress Notes (Signed)
Patient ID: [name], female    DOB: [birth year]  MRN: [id number]  CC: re-est care  Subjective: [name] is a 44 y.o. female who presents for re-est care.  Last seen here 2018.Her concerns today include:  Pt with hx of GERD, anxiety, depression, bipolar, HTN, hep C, history of polysubstance abuse (methamphetamine, cocaine, heroin in remission on theses, alcohol use disorder, IDA  Seen in ER yesterday for GI issues related to GERD.  Ran out of Protonix 1 mth.  Did well on it.  Requesting refill  Having relapse with ETOH use. Ran out of Effexor, Zyprex and Gabapentin 2 wks.  Started drinking more after she ran out of meds.  Drinking a pint a day. "I'm so depress. The drinking is the only thing that is getting me up in the mornings." Loss mom 2018 from overdose, father 01/2018 from brain cancer and father of her children last yr. has history of polysubstance abuse.  Clean of meth for 6-7 mths and cocine and heroin for yrs. still uses marijuana. -History from ER visit yesterday shows elevated AST and ALT along with elevated albumin level. -denies SI at this time.  Lives alone. -use to be seen at [facility] where she had a therapist and psychiatrist.  Reports she did well with keeping appts and taking her meds.  She was switch to [facility] as [facility].  Has appt with BHY[facility] in 3 days.  She has had frequent ER visits and has also had psychiatric admissions over the past several months.  HTN:  Off meds for a while.  Denies any headaches, chest pains or lower extremity edema  A1C 1 mth ago was 6.1.  Fhx of DM in GM and GF.  Denies any polyuria or polydipsia.  Complains of dysuria and frequent UTIs.  Currently taking Azo UA that was done through the emergency room recently consistent with UTI.  However patient was not given antibiotics.    No fever.  IDA:  Chews ice constantly.  Feels tire.  Denies any dizziness.  Prescribed iron in the past but never took it consistently  because iron makes her constipated.  She has had progressive anemia with hemoglobin trending down from 11 to the 9s currently Menses sporatic.  Can skip a mth or 2.  Bleeds heavy for 2 days then spot for 2-3 days  Patient Active Problem List   Diagnosis Date Noted  . Diarrhea 03/24/2020  . Hypokalemia 03/24/2020  . Iron deficiency anemia 03/24/2020  . MDD (major depressive disorder), recurrent, severe, with psychosis ([place]) 03/23/2020  . 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[place])   . Severe recurrent major depression without psychotic features ([place]) 03/22/2020  . Adjustment disorder with mixed anxiety and depressed mood 01/10/2020  . Polysubstance abuse ([place]) 01/10/2020  . Gastroesophageal reflux disease 03/08/2017  . Anxiety 03/08/2017  . Anxiety and depression 10/10/2015  . History of intravenous drug use in remission 09/07/2011  . HCV infection   . Essential hypertension   . Bipolar disorder ([place])   . Bicornuate uterus      Current Outpatient Medications on File Prior to Visit  Medication Sig Dispense Refill  . [DISCONTINUED] losartan-hydrochlorothiazide (HYZAAR) 100-25 MG tablet Take 1 tablet by mouth daily. 30 tablet 0   No current facility-administered medications on file prior to visit.    No Known Allergies  Social History   Socioeconomic History  . Marital status: Single    Spouse name: Not on file  . Number of children: Not  on file  . Years of education: Not on file  . Highest education level: Not on file  Occupational History  . Not on file  Tobacco Use  . Smoking status: Current Every Day Smoker    Packs/day: 0.50    Years: 20.00    Pack years: 10.00    Types: Cigarettes    Start date: 05/04/2014  . Smokeless tobacco: Never Used  . Tobacco comment: using the vapor only  Vaping Use  . Vaping Use: Some days  . Substances: Nicotine  Substance and Sexual Activity  . Alcohol use: Yes    Comment: rarely  . Drug use: Not Currently    Types: Cocaine, IV, Marijuana    . Sexual activity: Yes    Birth control/protection: Surgical  Other Topics Concern  . Not on file  Social History Narrative   Marital status: single; dating seriously x 3 years in 2016; males only.     Children: 2 children; no grandchildren.     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      Drugs:  Clean 02/14/2013; attends NA every day; sponsor.        Sexual activity:  Total partners = 150.  +chlamydia in 1998.        Seatbelt: 100%      Guns:     Social Determinants of Radio broadcast assistant Strain:   . Difficulty of Paying Living Expenses:   Food Insecurity:   . Worried About Charity fundraiser in the Last Year:   . Arboriculturist in the Last Year:   Transportation Needs:   . Film/video editor (Medical):   Marland Kitchen Lack of Transportation (Non-Medical):   Physical Activity:   . Days of Exercise per Week:   . Minutes of Exercise per Session:   Stress:   . Feeling of Stress :   Social Connections:   . Frequency of Communication with Friends and Family:   . Frequency of Social Gatherings with Friends and Family:   . Attends Religious Services:   . Active Member of Clubs or Organizations:   . Attends Archivist Meetings:   Marland Kitchen Marital Status:   Intimate Partner Violence:   . Fear of Current or Ex-Partner:   . Emotionally Abused:   Marland Kitchen Physically Abused:   . Sexually Abused:     Family History  Problem Relation Age of Onset  . Hypertension Mother   . Mental illness Mother        depression; alot of mental illness.  . Cancer Father 46       Colon cancer age 54  . Thyroid disease Maternal Grandmother   . Diabetes Paternal Grandfather   . Anesthesia problems Neg Hx   . Other Neg Hx     Past Surgical History:  Procedure Laterality Date  . BILATERAL SALPINGECTOMY  12/06/2012   Procedure:  BILATERAL SALPINGECTOMY;  Surgeon: Mora Bellman, MD;  Location: [facility];  Service: Gynecology;  Laterality: Bilateral;  laparoscopic  . CERVICAL CERCLAGE  09/01/2011   Procedure: CERCLAGE CERVICAL;  Surgeon: Donnamae Jude, MD;  Location: [facility];  Service: Gynecology;  Laterality: N/A;  . CERVICAL CERCLAGE  06/28/2012   Procedure: CERCLAGE CERVICAL;  Surgeon: Donnamae Jude, MD;  Location:  [facility];  Service: Gynecology;  Laterality: N/A;  . DILATION AND CURETTAGE OF UTERUS     SAB  . FINGER SURGERY  2003   reattachment of right forefinger  . THERAPEUTIC ABORTION    . THERAPEUTIC ABORTION    . TONSILLECTOMY    . TUBAL LIGATION  11/23/2012    ROS: Review of Systems Negative except as stated above  PHYSICAL EXAM: BP 134/90 (BP Location: Left Arm)   Pulse 91   Temp 98.1 F (36.7 C)   Resp 16   Wt 166 lb (75.3 kg)   LMP 04/29/2020 (Approximate) Comment: tubal ligation  SpO2 97%   BMI 31.37 kg/m   Wt Readings from Last 3 Encounters:  05/21/20 166 lb (75.3 kg)  05/20/20 165 lb (74.8 kg)  05/18/20 165 lb 8 oz (75.1 kg)    Physical Exam  General appearance - alert, well appearing, and in no distress Mental status -patient tearful at times.  She answers questions appropriately. Eyes -pale conjunctiva  neck - supple, no significant adenopathy Chest - clear to auscultation, no wheezes, rales or rhonchi, symmetric air entry Heart - normal rate, regular rhythm, normal S1, S2, no murmurs, rubs, clicks or gallops Extremities - peripheral pulses normal, no pedal edema, no clubbing or cyanosis  Depression screen [facility] 2/9 05/21/2020 10/27/2017 03/08/2017  Decreased Interest 1 1 0  Down, Depressed, Hopeless 1 1 0  PHQ - 2 Score 2 2 0  Altered sleeping 1 3 1   Tired, decreased energy 1 1 1   Change in appetite 1 1 0  Feeling bad or failure about yourself  1 1 0  Trouble concentrating 1 3 0  Moving slowly or fidgety/restless 1 2 1   Suicidal thoughts 1 0 0  PHQ-9 Score 9 13 3     CMP Latest  Ref Rng & Units 05/20/2020 03/26/2020 03/25/2020  Glucose 70 - 99 mg/dL 119(H) 110(H) 109(H)  BUN 6 - 20 mg/dL 5(L) 8 9  Creatinine 0.44 - 1.00 mg/dL 0.65 0.47 0.59  Sodium 135 - 145 mmol/L 136 135 137  Potassium 3.5 - 5.1 mmol/L 2.7(LL) 3.9 3.4(L)  Chloride 98 - 111 mmol/L 98 102 100  CO2 22 - 32 mmol/L 26 25 25   Calcium 8.9 - 10.3 mg/dL 9.5 9.6 10.1  Total Protein 6.5 - 8.1 g/dL 7.9 - -  Total Bilirubin 0.3 - 1.2 mg/dL 1.7(H) - -  Alkaline Phos 38 - 126 U/L 118 - -  AST 15 - 41 U/L 255(H) - -  ALT 0 - 44 U/L 117(H) - -   Lipid Panel     Component Value Date/Time   CHOL 224 (H) 03/23/2020 0704   TRIG 215 (H) 03/23/2020 0704   HDL 63 03/23/2020 0704   CHOLHDL 3.6 03/23/2020 0704   VLDL 43 (H) 03/23/2020 0704   LDLCALC 118 (H) 03/23/2020 0704    CBC    Component Value Date/Time   WBC 13.0 (H) 05/20/2020 1728   RBC 5.02 05/20/2020 1728   HGB 9.9 (L) 05/20/2020 1728   HCT 34.2 (L) 05/20/2020 1728   HCT 42 06/15/2011 0920   PLT 415 (H) 05/20/2020 1728   MCV 68.1 (L) 05/20/2020 1728   MCV 82.5 09/04/2015 1857   MCH 19.7 (L) 05/20/2020 1728   MCHC 28.9 (L) 05/20/2020 1728   RDW 16.7 (H) 05/20/2020 1728   LYMPHSABS 2.8 01/10/2017 1750   MONOABS 0.6 01/10/2017 1750   EOSABS 0.0 01/10/2017 1750   BASOSABS 0.0 01/10/2017 1750   Lab  Results  Component Value Date   HGBA1C 6.1 (H) 03/23/2020   Lab Results  Component Value Date   IRON 26 (L) 03/23/2020   TIBC 575 (H) 03/23/2020   FERRITIN 4 (L) 03/23/2020    ASSESSMENT AND PLAN: 1. Severe recurrent major depression without psychotic features ([place]) I will refill Effexor and Zyprexa.  Advised patient to be seen in the emergency room if she begins feeling suicidal again.  Seen by LCSW today.  She will keep appointment with behavioral health later this month. - venlafaxine XR (EFFEXOR-XR) 150 MG 24 hr capsule; Take 1 capsule (150 mg total) by mouth daily with breakfast.  Dispense: 30 capsule; Refill: 1  2. Bipolar affective  disorder, current episode depressed, current episode severity unspecified ([place]) See #1 above - OLANZapine (ZYPREXA) 5 MG tablet; Take 1 tablet (5 mg total) by mouth at bedtime.  Dispense: 30 tablet; Refill: 1  3. Essential hypertension Not at goal.  Restart amlodipine - amLODipine (NORVASC) 10 MG tablet; Take 1 tablet (10 mg total) by mouth daily.  Dispense: 30 tablet; Refill: 6  4. Prediabetes Dietary counseling given.  We will start her on low-dose of Metformin - metFORMIN (GLUCOPHAGE) 500 MG tablet; Take 1 tablet (500 mg total) by mouth daily. For prediabetes  Dispense: 30 tablet; Refill: 3  5. Acute cystitis without hematuria - ciprofloxacin (CIPRO) 500 MG tablet; Take 1 tablet (500 mg total) by mouth 2 (two) times daily.  Dispense: 14 tablet; Refill: 0 - Urine Culture  6. Other iron deficiency anemia Chronic and progressive.  May be due to insufficient iron rich foods and pica.  Menses are not frequent and seem not to be heavy.  Advised to stop eating ice. Restart iron supplement.  We will put her on a stool softener to use with the iron.  Patient is agreeable to this. - ferrous sulfate 325 (65 FE) MG tablet; Take 1 tablet (325 mg total) by mouth daily with breakfast.  Dispense: 100 tablet; Refill: 1 - senna-docusate (SENOKOT-S) 8.6-50 MG tablet; Take 1 tablet by mouth daily as needed for mild constipation.  Dispense: 30 tablet; Refill: 1  7. Pica See #6 above 8. Gastroesophageal reflux disease without esophagitis GERD precautions discussed.  Encouraged her to discontinue drinking alcoholic beverages - pantoprazole (PROTONIX) 40 MG tablet; Take 1 tablet (40 mg total) by mouth daily.  Dispense: 30 tablet; Refill: 4  9. Alcohol use disorder, severe, dependence ([place]) 10. Alcoholic hepatitis without ascites Encouraged her to get into a treatment program.  Advised to stop drinking alcoholic beverages.  Current liver enzymes suggests transaminitis due to EtOH use.  She has history of  hepatitis C also but enzymes had not been elevated several months ago.  11. Anxiety Keep appointment with behavioral health    Patient was given the opportunity to ask questions.  Patient verbalized understanding of the plan and was able to repeat key elements of the plan.   Orders Placed This Encounter  Procedures  . Urine Culture     Requested Prescriptions   Signed Prescriptions Disp Refills  . pantoprazole (PROTONIX) 40 MG tablet 30 tablet 4    Sig: Take 1 tablet (40 mg total) by mouth daily.  Marland Kitchen amLODipine (NORVASC) 10 MG tablet 30 tablet 6    Sig: Take 1 tablet (10 mg total) by mouth daily.  Marland Kitchen venlafaxine XR (EFFEXOR-XR) 150 MG 24 hr capsule 30 capsule 1    Sig: Take 1 capsule (150 mg total) by mouth daily with breakfast.  .  OLANZapine (ZYPREXA) 5 MG tablet 30 tablet 1    Sig: Take 1 tablet (5 mg total) by mouth at bedtime.  . ciprofloxacin (CIPRO) 500 MG tablet 14 tablet 0    Sig: Take 1 tablet (500 mg total) by mouth 2 (two) times daily.  . metFORMIN (GLUCOPHAGE) 500 MG tablet 30 tablet 3    Sig: Take 1 tablet (500 mg total) by mouth daily. For prediabetes  . ferrous sulfate 325 (65 FE) MG tablet 100 tablet 1    Sig: Take 1 tablet (325 mg total) by mouth daily with breakfast.  . senna-docusate (SENOKOT-S) 8.6-50 MG tablet 30 tablet 1    Sig: Take 1 tablet by mouth daily as needed for mild constipation.    Return in about 6 weeks (around 07/02/2020).  Karle Plumber, MD, FACP

## 2020-05-21 NOTE — BH Assessment (Signed)
[place] Assessment Progress Note  Per Hampton Abbot, MD, this pt does not require psychiatric hospitalization at this time.  Pt is to be discharged from [facility] with recommendation to follow up with [facility].  Pt has been scheduled for an intake appointment on Friday, 05/24/2020 at 14:00.  This has been included in pt's discharge instructions.  Pt's nurse has been notified.  Jalene Mullet, [place] Triage Specialist [phone]

## 2020-05-21 NOTE — Discharge Instructions (Signed)
For your behavioral health needs, you are advised to follow up with [facility].  You are scheduled for an intake appointment on Friday, May 24, 2020 at 2:00 pm:       [facility]      [place], [place] [address]      [phone]

## 2020-05-21 NOTE — Progress Notes (Signed)
New patient needs all her meds refills   Current not seeing anyone but has an appt at [facility] on Friday  Pt currently is under a lot of stress and would like to [place] /

## 2020-05-21 NOTE — ED Provider Notes (Signed)
Emergency Medicine Observation Re-evaluation Note  [name] is a 44 y.o. female, seen on rounds today.  Pt initially presented to the ED for complaints of Chest Pain, Abdominal Pain, and Emesis Currently, the patient is calm and cooperative.  Physical Exam  BP 126/85   Pulse 82   Temp 98.7 F (37.1 C) (Oral)   Resp 18   Ht 5\' 1"  (1.549 m)   Wt 74.8 kg   LMP 04/29/2020 (Approximate) Comment: tubal ligation  SpO2 98%   BMI 31.18 kg/m  Physical Exam  ED Course / MDM  EKG:EKG Interpretation  Date/Time:  Monday May 20 2020 20:41:57 EDT Ventricular Rate:  101 PR Interval:    QRS Duration: 99 QT Interval:  411 QTC Calculation: 533 R Axis:   -31 Text Interpretation: Sinus tachycardia Left axis deviation Abnormal R-wave progression, late transition Prolonged QT interval No significant change since last tracing Confirmed by Deno Etienne [phone]) on 05/20/2020 8:47:33 PM    I have reviewed the labs performed to date as well as medications administered while in observation.  Recent changes in the last 24 hours include evaluation by psychiatry.  Dr. Dwyane Dee contacted me and felt she was appropriate for discharge and outpatient follow-up.Marland Kitchen Plan  Current plan is for discharge. Patient is not under full IVC at this time.   Hayden Rasmussen, MD 05/21/20 [phone]

## 2020-05-22 MED FILL — FERROUS SULFATE 325 MG TAB: 325 (65 FE) | 30 days supply | Qty: 30 | Fill #0

## 2020-05-23 LAB — URINE CULTURE

## 2020-05-24 ENCOUNTER — Ambulatory Visit (HOSPITAL_COMMUNITY): Payer: Self-pay | Admitting: Licensed Clinical Social Worker

## 2020-05-24 ENCOUNTER — Telehealth: Payer: Self-pay | Admitting: Addiction (Substance Use Disorder)

## 2020-05-24 ENCOUNTER — Telehealth: Payer: Self-pay

## 2020-05-24 NOTE — Telephone Encounter (Signed)
[name] from [facility] is calling because she is receiving faxes for the patient. Faxes from [place] are being sent to the [facility] center for Child wellness. Wanting to know is there a reason why they are being sent there. Patient is not pediatric. Please advise CB- [phone]

## 2020-05-24 NOTE — Telephone Encounter (Signed)
Contacted pt to go over urine results pt didn't answer lvm asking pt to give a call back at her earliest convenience

## 2020-05-24 NOTE — Telephone Encounter (Signed)
Please advise 

## 2020-05-26 NOTE — Progress Notes (Signed)
Integrated Behavioral Health Initial Visit  MRN: [id number] Name: [name]  Number of [place] Clinician visits:: 1/6 Session Start time: 4:15 PM  Session End time: 4:30 PM Total time: 15  Type of Service: [place] Interpretor:No. Interpretor Name and Language: NA   Warm Hand Off Completed.       SUBJECTIVE: [name] is a 44 y.o. female accompanied by Self Patient was referred by Dr. Wynetta Emery for behavioral health and substance use. Patient reports the following symptoms/concerns: Reports difficulty managing manic behavior health and substance use triggered by grief.  Patient shared that she has been off of her medications for approximately 2 weeks and has noticed a increase in symptoms Duration of problem::; Severity of problem: severe  OBJECTIVE: Mood: Anxious and Affect: Appropriate Risk of harm to self or others: No plan to harm self or others patient scored positive on PHQ-9 however denies current suicidal ideations or intent to harm self or others.  Active factors identified, safety plan discussed, and crisis intervention resources provided  LIFE CONTEXT: Family and Social: Patient reports strong support from friends School/Work: Patient receives food stamps Self-Care: Patient is open to reinitiation of medication management Life Changes: Patient reports difficulty managing behavioral health and substance use  GOALS ADDRESSED: Patient will: 1. Reduce symptoms of: anxiety and depression 2. Increase knowledge and/or ability of: coping skills and healthy habits  3. Demonstrate ability to: Increase healthy adjustment to current life circumstances, Increase adequate support systems for patient/family and Improve medication compliance  INTERVENTIONS: Interventions utilized: Solution-Focused Strategies, Supportive Counseling, Psychoeducation and/or Health Education and Link to [employer]  Standardized  Assessments completed: GAD-7 and PHQ 2&9 with C-SSRS  ASSESSMENT: Patient currently experiencing increase in depression anxiety symptoms triggered by grief, substance use, and being off of meds for approximately 2 weeks.   Patient scored positive on PHQ-9 however denies current suicidal ideations or intent to harm self or others.  Active factors identified, safety plan discussed, and crisis intervention resources provided   Patient has a scheduled appointment with behavioral health on Friday at 2 PM.  She participates in support groups and is open to re-initiation of medication.  Patient successfully identified healthy coping skills and LCSW provided resources on food insecurity and financial counseling application.  PLAN: 1. Follow up with behavioral health clinician on : Contact LCSW with behavioral health and/or resource needs 2. Behavioral recommendations: Utilize strategies and resources provided 3. Referral(s): [place] (In Clinic) 4. "From scale of 1-10, how likely are you to follow plan?":   Rebekah [place], LCSW 05/26/2020 8:33 AM

## 2020-06-05 ENCOUNTER — Other Ambulatory Visit: Payer: Self-pay

## 2020-06-05 ENCOUNTER — Telehealth: Payer: Self-pay | Admitting: Addiction (Substance Use Disorder)

## 2020-06-05 DIAGNOSIS — K219 Gastro-esophageal reflux disease without esophagitis: Secondary | ICD-10-CM

## 2020-06-05 DIAGNOSIS — F332 Major depressive disorder, recurrent severe without psychotic features: Secondary | ICD-10-CM

## 2020-06-05 DIAGNOSIS — I10 Essential (primary) hypertension: Secondary | ICD-10-CM

## 2020-06-05 DIAGNOSIS — D508 Other iron deficiency anemias: Secondary | ICD-10-CM

## 2020-06-05 DIAGNOSIS — R7303 Prediabetes: Secondary | ICD-10-CM

## 2020-06-05 MED ORDER — VENLAFAXINE HCL ER 150 MG PO CP24: 150.0000 mg | ORAL_CAPSULE | Freq: Every day | ORAL | 1 refills | Status: DC

## 2020-06-05 MED ORDER — METFORMIN HCL 500 MG PO TABS: 500.0000 mg | ORAL_TABLET | Freq: Every day | ORAL | 1 refills | Status: DC

## 2020-06-05 MED ORDER — FERROUS SULFATE 325 (65 FE) MG PO TABS: 325.0000 mg | ORAL_TABLET | Freq: Every day | ORAL | 1 refills | Status: DC

## 2020-06-05 MED ORDER — AMLODIPINE BESYLATE 10 MG PO TABS: 10.0000 mg | ORAL_TABLET | Freq: Every day | ORAL | 1 refills | Status: DC

## 2020-06-05 MED ORDER — PANTOPRAZOLE SODIUM 40 MG PO TBEC: 40.0000 mg | DELAYED_RELEASE_TABLET | Freq: Every day | ORAL | 1 refills | Status: DC

## 2020-06-05 MED ORDER — SENNOSIDES-DOCUSATE SODIUM 8.6-50 MG PO TABS: 1.0000 | ORAL_TABLET | Freq: Every day | ORAL | 1 refills | Status: DC | PRN

## 2020-06-05 NOTE — Telephone Encounter (Signed)
Contacted pt and made aware that she can pick up rxs in 30 mins or so. Pt states she will be by before we close to pick them up

## 2020-06-05 NOTE — Telephone Encounter (Signed)
Patient currently in [facility] and will be checking in to [facility] detox center in the morning, requesting hard scripts of all medications with 1 refill, patient would like this done today and would like a follow up call today due to time being sensitve

## 2020-06-05 NOTE — Telephone Encounter (Signed)
Will forward to pcp

## 2020-06-12 MED FILL — ?PANTOPRAZOLE SODI DR 40MGT: 40 | 30 days supply | Qty: 30 | Fill #1

## 2020-07-01 DIAGNOSIS — F1020 Alcohol dependence, uncomplicated: Secondary | ICD-10-CM | POA: Insufficient documentation

## 2020-07-01 DIAGNOSIS — F332 Major depressive disorder, recurrent severe without psychotic features: Secondary | ICD-10-CM | POA: Insufficient documentation

## 2020-07-02 ENCOUNTER — Ambulatory Visit: Payer: Medicaid Other | Admitting: Internal Medicine

## 2020-07-03 DIAGNOSIS — F1420 Cocaine dependence, uncomplicated: Secondary | ICD-10-CM | POA: Insufficient documentation

## 2020-07-14 ENCOUNTER — Other Ambulatory Visit: Payer: Self-pay

## 2020-07-14 ENCOUNTER — Emergency Department (HOSPITAL_COMMUNITY)
Admission: EM | Admit: 2020-07-14 | Discharge: 2020-07-15 | Disposition: A | Discharge status: Home / Self Care | Payer: Medicaid Other | Attending: Emergency Medicine | Admitting: Emergency Medicine

## 2020-07-14 ENCOUNTER — Encounter (HOSPITAL_COMMUNITY): Payer: Self-pay

## 2020-07-14 DIAGNOSIS — R531 Weakness: Secondary | ICD-10-CM | POA: Insufficient documentation

## 2020-07-14 DIAGNOSIS — Z7984 Long term (current) use of oral hypoglycemic drugs: Secondary | ICD-10-CM | POA: Insufficient documentation

## 2020-07-14 DIAGNOSIS — M791 Myalgia, unspecified site: Secondary | ICD-10-CM | POA: Insufficient documentation

## 2020-07-14 DIAGNOSIS — Y999 Unspecified external cause status: Secondary | ICD-10-CM | POA: Insufficient documentation

## 2020-07-14 DIAGNOSIS — Z79899 Other long term (current) drug therapy: Secondary | ICD-10-CM | POA: Insufficient documentation

## 2020-07-14 DIAGNOSIS — I10 Essential (primary) hypertension: Secondary | ICD-10-CM | POA: Insufficient documentation

## 2020-07-14 DIAGNOSIS — J45909 Unspecified asthma, uncomplicated: Secondary | ICD-10-CM | POA: Insufficient documentation

## 2020-07-14 DIAGNOSIS — R5383 Other fatigue: Secondary | ICD-10-CM | POA: Insufficient documentation

## 2020-07-14 DIAGNOSIS — X58XXXA Exposure to other specified factors, initial encounter: Secondary | ICD-10-CM | POA: Insufficient documentation

## 2020-07-14 DIAGNOSIS — Y939 Activity, unspecified: Secondary | ICD-10-CM | POA: Insufficient documentation

## 2020-07-14 DIAGNOSIS — T426X2A Poisoning by other antiepileptic and sedative-hypnotic drugs, intentional self-harm, initial encounter: Secondary | ICD-10-CM | POA: Insufficient documentation

## 2020-07-14 DIAGNOSIS — R112 Nausea with vomiting, unspecified: Secondary | ICD-10-CM | POA: Insufficient documentation

## 2020-07-14 DIAGNOSIS — Z20822 Contact with and (suspected) exposure to covid-19: Secondary | ICD-10-CM | POA: Insufficient documentation

## 2020-07-14 DIAGNOSIS — F1510 Other stimulant abuse, uncomplicated: Secondary | ICD-10-CM

## 2020-07-14 DIAGNOSIS — Y929 Unspecified place or not applicable: Secondary | ICD-10-CM | POA: Insufficient documentation

## 2020-07-14 DIAGNOSIS — F17210 Nicotine dependence, cigarettes, uncomplicated: Secondary | ICD-10-CM | POA: Insufficient documentation

## 2020-07-14 DIAGNOSIS — T1491XA Suicide attempt, initial encounter: Secondary | ICD-10-CM | POA: Insufficient documentation

## 2020-07-14 DIAGNOSIS — T50902A Poisoning by unspecified drugs, medicaments and biological substances, intentional self-harm, initial encounter: Secondary | ICD-10-CM

## 2020-07-14 LAB — COMPREHENSIVE METABOLIC PANEL
ALT: 28 U/L (ref 0–44)
AST: 35 U/L (ref 15–41)
Albumin: 4.3 g/dL (ref 3.5–5.0)
Alkaline Phosphatase: 59 U/L (ref 38–126)
Anion gap: 11 (ref 5–15)
BUN: 6 mg/dL (ref 6–20)
CO2: 24 mmol/L (ref 22–32)
Calcium: 9.1 mg/dL (ref 8.9–10.3)
Chloride: 103 mmol/L (ref 98–111)
Creatinine, Ser: 0.65 mg/dL (ref 0.44–1.00)
GFR calc Af Amer: >60 mL/min (ref >60)
GFR calc non Af Amer: >60 mL/min (ref >60)
Glucose, Bld: 108 mg/dL — ABNORMAL HIGH (ref 70–99)
Potassium: 3.2 mmol/L — ABNORMAL LOW (ref 3.5–5.1)
Sodium: 138 mmol/L (ref 135–145)
Total Bilirubin: 0.3 mg/dL (ref 0.3–1.2)
Total Protein: 7.6 g/dL (ref 6.5–8.1)

## 2020-07-14 LAB — CBC WITH DIFFERENTIAL/PLATELET
Abs Immature Granulocytes: 0.02 10*3/uL (ref 0.00–0.07)
Basophils Absolute: 0.1 10*3/uL (ref 0.0–0.1)
Basophils Relative: 1 %
Eosinophils Absolute: 0.2 10*3/uL (ref 0.0–0.5)
Eosinophils Relative: 2 %
HCT: 37.7 % (ref 36.0–46.0)
Hemoglobin: 11.5 g/dL — ABNORMAL LOW (ref 12.0–15.0)
Immature Granulocytes: 0 %
Lymphocytes Relative: 25 %
Lymphs Abs: 2.4 10*3/uL (ref 0.7–4.0)
MCH: 22.8 pg — ABNORMAL LOW (ref 26.0–34.0)
MCHC: 30.5 g/dL (ref 30.0–36.0)
MCV: 74.7 fL — ABNORMAL LOW (ref 80.0–100.0)
Monocytes Absolute: 1.2 10*3/uL — ABNORMAL HIGH (ref 0.1–1.0)
Monocytes Relative: 12 %
Neutro Abs: 5.8 10*3/uL (ref 1.7–7.7)
Neutrophils Relative %: 60 %
Platelets: 338 10*3/uL (ref 150–400)
RBC: 5.05 MIL/uL (ref 3.87–5.11)
RDW: 22.1 % — ABNORMAL HIGH (ref 11.5–15.5)
WBC: 9.6 10*3/uL (ref 4.0–10.5)
nRBC: 0 % (ref 0.0–0.2)

## 2020-07-14 LAB — RAPID URINE DRUG SCREEN, HOSP PERFORMED
Amphetamines: POSITIVE — AB
Barbiturates: NOT DETECTED
Benzodiazepines: NOT DETECTED
Cocaine: NOT DETECTED
Opiates: NOT DETECTED
Tetrahydrocannabinol: NOT DETECTED

## 2020-07-14 LAB — I-STAT BETA HCG BLOOD, ED (MC, WL, AP ONLY): I-stat hCG, quantitative: <5 m[IU]/mL (ref <5)

## 2020-07-14 LAB — SALICYLATE LEVEL: Salicylate Lvl: <7 mg/dL — ABNORMAL LOW (ref 7.0–30.0)

## 2020-07-14 LAB — ACETAMINOPHEN LEVEL
Acetaminophen (Tylenol), Serum: <10 ug/mL — ABNORMAL LOW (ref 10–30)
Acetaminophen (Tylenol), Serum: <10 ug/mL — ABNORMAL LOW (ref 10–30)

## 2020-07-14 LAB — ETHANOL: Alcohol, Ethyl (B): <10 mg/dL (ref <10)

## 2020-07-14 LAB — SARS CORONAVIRUS 2 BY RT PCR (HOSPITAL ORDER, PERFORMED IN ~~LOC~~ HOSPITAL LAB): SARS Coronavirus 2: NEGATIVE

## 2020-07-14 MED ORDER — LORAZEPAM 2 MG/ML IJ SOLN
1.0000 mg | Freq: Once | INTRAMUSCULAR | Status: AC
  Administered 2020-07-14: 1 mg via INTRAVENOUS
  Filled 2020-07-14: qty 1

## 2020-07-14 MED ORDER — HYDRALAZINE HCL 20 MG/ML IJ SOLN
10.0000 mg | Freq: Once | INTRAMUSCULAR | Status: AC
  Administered 2020-07-14: 10 mg via INTRAVENOUS
  Filled 2020-07-14: qty 1

## 2020-07-14 MED ORDER — LORAZEPAM 2 MG/ML IJ SOLN: 1.0000 mg | Freq: Once | INTRAMUSCULAR | Status: DC

## 2020-07-14 MED ORDER — AMLODIPINE BESYLATE 5 MG PO TABS
10.0000 mg | ORAL_TABLET | Freq: Every day | ORAL | Status: DC
  Administered 2020-07-15: 10 mg via ORAL
  Filled 2020-07-14 (×2): qty 2

## 2020-07-14 MED ORDER — AMLODIPINE BESYLATE 5 MG PO TABS
10.0000 mg | ORAL_TABLET | Freq: Once | ORAL | Status: AC
  Administered 2020-07-14: 10 mg via ORAL
  Filled 2020-07-14: qty 2

## 2020-07-14 MED ORDER — POTASSIUM CHLORIDE CRYS ER 20 MEQ PO TBCR
40.0000 meq | EXTENDED_RELEASE_TABLET | Freq: Once | ORAL | Status: AC
  Administered 2020-07-14: 40 meq via ORAL
  Filled 2020-07-14: qty 2

## 2020-07-14 MED ORDER — LORAZEPAM 2 MG/ML IJ SOLN
2.0000 mg | Freq: Once | INTRAMUSCULAR | Status: AC
  Administered 2020-07-14: 2 mg via INTRAMUSCULAR
  Filled 2020-07-14: qty 1
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
## 2020-07-14 NOTE — ED Notes (Signed)
Patient stated she is seeing double. 2 [facility], etc

## 2020-07-14 NOTE — ED Provider Notes (Signed)
[place] DEPT Provider Note   CSN: [id number] Arrival date & time: 07/14/20  1519     History Chief Complaint  Patient presents with  . Suicide Attempt    [name] is a 44 y.o. female with a past medical history of substance abuse, hypertension, GERD, alcohol abuse, depression, anxiety presenting to the ED for overdose. States that just prior to arrival she took 10 to 15 tablets of gabapentin 300mg  and about 7 tablets of Remeron. For the past week she has been having tremors, nausea, vomiting, pain all over her body, generalized weakness and "twitching." She is concerned that the family members that are preparing her meth injections are putting "something else in it." Reports seeing "white residue at the bottom of the water bottles that they give me." Reports overdosing on these medications in an attempt to harm herself. She denies any HI, AVH. Has not used drugs since 5 days ago.  HPI     Past Medical History:  Diagnosis Date  . Abnormal Pap smear 1998   had colpo  . Anxiety   . Asthma    ,as child only   no inhaler  . Bicornate uterus   . Bicornuate uterus   . Chlamydia 2000  . GERD (gastroesophageal reflux disease)   . HCV infection    HX of Hep C  . Headache(784.0)   . Hypertension 2010  . IUP (intrauterine pregnancy), incidental 13 weeks  . Opiate dependence ([place])    stopped methadone 01/2011  . PONV (postoperative nausea and vomiting)   . STD (female)    HX of STD's    Patient Active Problem List   Diagnosis Date Noted  . Pica 05/21/2020  . Prediabetes 05/21/2020  . Alcohol use disorder, severe, dependence ([place]) 05/21/2020  . Diarrhea 03/24/2020  . Hypokalemia 03/24/2020  . Iron deficiency anemia 03/24/2020  . MDD (major depressive disorder), recurrent, severe, with psychosis ([place]) 03/23/2020  . 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 Severe recurrent major depression without psychotic features ([place]) 03/22/2020  .  Adjustment disorder with mixed anxiety and depressed mood 01/10/2020  . Polysubstance abuse ([place]) 01/10/2020  . Gastroesophageal reflux disease 03/08/2017  . Anxiety 03/08/2017  . Anxiety and depression 10/10/2015  . History of intravenous drug use in remission 09/07/2011  . HCV infection   . Essential hypertension   . Bipolar disorder ([place])   . Bicornuate uterus     Past Surgical History:  Procedure Laterality Date  . BILATERAL SALPINGECTOMY  12/06/2012   Procedure: BILATERAL SALPINGECTOMY;  Surgeon: Mora Bellman, MD;  Location: [facility];  Service: Gynecology;  Laterality: Bilateral;  laparoscopic  . CERVICAL CERCLAGE  09/01/2011   Procedure: CERCLAGE CERVICAL;  Surgeon: Donnamae Jude, MD;  Location: [facility];  Service: Gynecology;  Laterality: N/A;  . CERVICAL CERCLAGE  06/28/2012   Procedure: CERCLAGE CERVICAL;  Surgeon: Donnamae Jude, MD;  Location: [facility];  Service: Gynecology;  Laterality: N/A;  . DILATION AND CURETTAGE OF UTERUS     SAB  . FINGER SURGERY  2003   reattachment of right forefinger  . THERAPEUTIC ABORTION    . THERAPEUTIC ABORTION    . TONSILLECTOMY    . TUBAL LIGATION  11/23/2012     OB History    Gravida  7   Para  4   Term  2   Preterm  2   AB  3   Living  2     SAB  1  TAB  2   Ectopic  0   Multiple  0   Live Births  3           Family History  Problem Relation Age of Onset  . Hypertension Mother   . Mental illness Mother        depression; alot of mental illness.  . Cancer Father 53       Colon cancer age 74  . Thyroid disease Maternal Grandmother   . Diabetes Paternal Grandfather   . Anesthesia problems Neg Hx   . Other Neg Hx     Social History   Tobacco Use  . Smoking status: Current Every Day Smoker    Packs/day: 0.50    Years: 20.00    Pack years: 10.00    Types: Cigarettes    Start date: 05/04/2014  . Smokeless tobacco: Never Used  . Tobacco comment: using the vapor only  Vaping Use  . Vaping Use: Some days  .  Substances: Nicotine  Substance Use Topics  . Alcohol use: Yes    Comment: rarely  . Drug use: Not Currently    Types: Cocaine, IV, Marijuana    Home Medications Prior to Admission medications   Medication Sig Start Date End Date Taking? Authorizing Provider  amLODipine (NORVASC) 10 MG tablet Take 1 tablet (10 mg total) by mouth daily. 06/05/20   Charlott Rakes, MD  ciprofloxacin (CIPRO) 500 MG tablet Take 1 tablet (500 mg total) by mouth 2 (two) times daily. 05/21/20   Ladell Pier, MD  ferrous sulfate 325 (65 FE) MG tablet Take 1 tablet (325 mg total) by mouth daily with breakfast. 06/05/20   Charlott Rakes, MD  metFORMIN (GLUCOPHAGE) 500 MG tablet Take 1 tablet (500 mg total) by mouth daily. For prediabetes 06/05/20   Charlott Rakes, MD  OLANZapine (ZYPREXA) 5 MG tablet Take 1 tablet (5 mg total) by mouth at bedtime. 05/21/20   Ladell Pier, MD  pantoprazole (PROTONIX) 40 MG tablet Take 1 tablet (40 mg total) by mouth daily. 06/05/20   Charlott Rakes, MD  senna-docusate (SENOKOT-S) 8.6-50 MG tablet Take 1 tablet by mouth daily as needed for mild constipation. 06/05/20   Charlott Rakes, MD  venlafaxine XR (EFFEXOR-XR) 150 MG 24 hr capsule Take 1 capsule (150 mg total) by mouth daily with breakfast. 06/05/20   Charlott Rakes, MD  losartan-hydrochlorothiazide (HYZAAR) 100-25 MG tablet Take 1 tablet by mouth daily. 06/10/19 01/02/20  Vanessa Kick, MD    Allergies    Patient has no known allergies.  Review of Systems   Review of Systems  Constitutional: Positive for fatigue. Negative for appetite change, chills and fever.  HENT: Negative for ear pain, rhinorrhea, sneezing and sore throat.   Eyes: Negative for photophobia and visual disturbance.  Respiratory: Negative for cough, chest tightness, shortness of breath and wheezing.   Cardiovascular: Positive for chest pain. Negative for palpitations.  Gastrointestinal: Positive for abdominal pain, nausea and vomiting. Negative for  blood in stool, constipation and diarrhea.  Genitourinary: Negative for dysuria, hematuria and urgency.  Musculoskeletal: Positive for myalgias.  Skin: Negative for rash.  Neurological: Positive for weakness. Negative for dizziness and light-headedness.    Physical Exam Updated Vital Signs BP (!) 185/126 (BP Location: Right Arm)   Pulse (!) 110   Temp 98.2 F (36.8 C) (Oral)   Resp 18   Ht 5\' 1"  (1.549 m)   Wt 75.3 kg   SpO2 98%   BMI 31.37 kg/m  Physical Exam Vitals and nursing note reviewed.  Constitutional:      General: She is not in acute distress.    Appearance: She is well-developed.  HENT:     Head: Normocephalic and atraumatic.     Nose: Nose normal.  Eyes:     General: No scleral icterus.       Left eye: No discharge.     Conjunctiva/sclera: Conjunctivae normal.  Cardiovascular:     Rate and Rhythm: Regular rhythm. Tachycardia present.     Heart sounds: Normal heart sounds. No murmur heard.  No friction rub. No gallop.   Pulmonary:     Effort: Pulmonary effort is normal. No respiratory distress.     Breath sounds: Normal breath sounds.  Abdominal:     General: Bowel sounds are normal. There is no distension.     Palpations: Abdomen is soft.     Tenderness: There is no abdominal tenderness. There is no guarding.  Musculoskeletal:        General: Normal range of motion.     Cervical back: Normal range of motion and neck supple.  Skin:    General: Skin is warm and dry.     Findings: No rash.  Neurological:     Mental Status: She is alert.     Motor: No abnormal muscle tone.     Coordination: Coordination normal.  Psychiatric:        Mood and Affect: Mood is anxious.        Behavior: Behavior is hyperactive.        Thought Content: Thought content is paranoid. Thought content includes suicidal ideation. Thought content does not include homicidal ideation. Thought content includes suicidal plan. Thought content does not include homicidal plan.     ED  Results / Procedures / Treatments   Labs (all labs ordered are listed, but only abnormal results are displayed) Labs Reviewed  COMPREHENSIVE METABOLIC PANEL - Abnormal; Notable for the following components:      Result Value   Potassium 3.2 (*)    Glucose, Bld 108 (*)    All other components within normal limits  SALICYLATE LEVEL - Abnormal; Notable for the following components:   Salicylate Lvl <0.2 (*)    All other components within normal limits  CBC WITH DIFFERENTIAL/PLATELET - Abnormal; Notable for the following components:   Hemoglobin 11.5 (*)    MCV 74.7 (*)    MCH 22.8 (*)    RDW 22.1 (*)    Monocytes Absolute 1.2 (*)    All other components within normal limits  ACETAMINOPHEN LEVEL - Abnormal; Notable for the following components:   Acetaminophen (Tylenol), Serum <10 (*)    All other components within normal limits  RAPID URINE DRUG SCREEN, HOSP PERFORMED - Abnormal; Notable for the following components:   Amphetamines POSITIVE (*)    All other components within normal limits  ACETAMINOPHEN LEVEL - Abnormal; Notable for the following components:   Acetaminophen (Tylenol), Serum <10 (*)    All other components within normal limits  ETHANOL  I-STAT BETA HCG BLOOD, ED ([facility], [facility], [facility] ONLY)    EKG EKG Interpretation  Date/Time:  Sunday July 14 2020 15:39:03 EDT Ventricular Rate:  102 PR Interval:    QRS Duration: 94 QT Interval:  380 QTC Calculation: 495 R Axis:   -38 Text Interpretation: Sinus tachycardia Left axis deviation No significant change since last tracing Confirmed by Lajean Saver [phone]) on 07/14/2020 4:24:16 PM   Radiology No results found.  Procedures Procedures (including critical care time)  Medications Ordered in ED Medications  LORazepam (ATIVAN) injection 1 mg (1 mg Intravenous Given 07/14/20 1851)  amLODipine (NORVASC) tablet 10 mg (10 mg Oral Given 07/14/20 1938)    ED Course  I have reviewed the triage vital signs and the nursing  notes.  Pertinent labs & imaging results that were available during my care of the patient were reviewed by me and considered in my medical decision making (see chart for details).    MDM Rules/Calculators/A&P                          44 year old female with past medical history of substance abuse, hypertension, alcohol abuse, depression and anxiety presenting to the ED for intentional overdose.  Took 10 to 15 tablets of gabapentin and 7 tablets of Remeron just prior to arrival in attempt to harm herself.  She is paranoid that her family members have been poisoning her with her meth injections.  She denies any HI, AVH.  Denies ongoing alcohol use.  On exam patient is hyperactive, unable to sit still.  She endorses SI.  She appears paranoid.  Spoke to poison control who recommends lab work including EKG and Tylenol level.  This was also repeated and Tylenol level remains normal.  Her QTc corrected is around 456 which is normal for her age.  Other lab work including CBC, salicylate level, CMP, ethanol level and hCG are unremarkable.  Patient with improvement in her anxiety and hyperactivity with Ativan given here in the ED.  Patient is medically cleared for TTS evaluation and disposition.   Portions of this note were generated with Lobbyist. Dictation errors may occur despite best attempts at proofreading.  Final Clinical Impression(s) / ED Diagnoses Final diagnoses:  Suicide attempt [facility])  Methamphetamine abuse ([place])  Intentional drug overdose, initial encounter [facility])    Rx / [place] Orders ED Discharge Orders    None       Delia Heady, PA-C 07/14/20 2020    Lajean Saver, MD 07/14/20 2242
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
## 2020-07-14 NOTE — ED Notes (Signed)
Patient ate a can of chicken soup/crackers/frozed luigis pop/ 580 ml of sprite

## 2020-07-14 NOTE — ED Notes (Signed)
Pt belonging bag is located in cabinet at nurses station.  All belongings inside except pt's shoes.  Bag is labeled with pt label.

## 2020-07-14 NOTE — ED Notes (Signed)
Spoke with posion control regarding pt. Recommended acetaminophen level, CMP and medicate with benzos as needed.

## 2020-07-14 NOTE — ED Provider Notes (Signed)
11:34 PM Pt is agitated and yelling, attempting to leave.  IVC completed by previous provider.  Pt's IV as infiltrated.  Will give ativan IM.     12:53 AM Patient resting comfortably at this time.  BP (!) 187/128 (BP Location: Right Arm)   Pulse (!) 103   Temp 98.5 F (36.9 C) (Oral)   Resp (!) 24   Ht 5\' 1"  (1.549 m)   Wt 75.3 kg   SpO2 97%   BMI 31.37 kg/m    3:06 AM Additional discussion with poison control.  As patient's repeat EKG showed prolonged QT they are recommending repeat EKG this time.  If QTC is greater than 500 they are recommending repeat labs.    EKG Interpretation  Date/Time:  Sunday July 14 2020 18:57:00 EDT Ventricular Rate:  108 PR Interval:    QRS Duration: 95 QT Interval:  395 QTC Calculation: 530 R Axis:   -35 Text Interpretation: Sinus tachycardia Left ventricular hypertrophy Anterior Q waves, possibly due to LVH Prolonged QT interval Confirmed by Ripley Fraise [phone]) on 07/15/2020 3:45:53 AM       3:48 AM QTC prolonged from 495 to 530.  Previous ECGs from previous ECG visits 530.  Will repeat labs per poison control recommendation.    6:27 AM Patient blood pressure has improved some but is not significantly lower than on arrival.  Hydralazine p.o. ordered.  Labs continue to pend.  BP (!) 164/118   Pulse (!) 104   Temp 98.3 F (36.8 C)   Resp 17   Ht 5\' 1"  (1.549 m)   Wt 75.3 kg   SpO2 98%   BMI 31.37 kg/m   At shift change care was transferred to [facility], PA-C who will follow repeat labs and BP.  If WNL and BP improved patient may be medically cleared.      Vonne Mcdanel, Gwenlyn Perking 07/15/20 5009    Ripley Fraise, MD 07/15/20 [phone]

## 2020-07-14 NOTE — ED Notes (Signed)
Pt requesting something for fever, headache and myalgia. PA made aware.

## 2020-07-14 NOTE — ED Notes (Addendum)
Pt stated to provider she also ingested 7 Remeron

## 2020-07-14 NOTE — ED Notes (Addendum)
Per poison control Repeat tylenol level and repeat EKG to make sure QTC is lower than original EKG

## 2020-07-14 NOTE — ED Notes (Signed)
Patient is complaining of not being able to urinate.

## 2020-07-15 ENCOUNTER — Other Ambulatory Visit: Payer: Self-pay

## 2020-07-15 ENCOUNTER — Encounter (HOSPITAL_COMMUNITY): Payer: Self-pay | Admitting: Psychiatry

## 2020-07-15 ENCOUNTER — Inpatient Hospital Stay (HOSPITAL_COMMUNITY)
Admission: AD | Admit: 2020-07-15 | Discharge: 2020-07-23 | DRG: 885 | Disposition: A | Discharge status: Home / Self Care | Payer: Federal, State, Local not specified - Other | Source: Other Acute Inpatient Hospital | Attending: Psychiatry | Admitting: Psychiatry

## 2020-07-15 DIAGNOSIS — F1210 Cannabis abuse, uncomplicated: Secondary | ICD-10-CM | POA: Diagnosis present

## 2020-07-15 DIAGNOSIS — Z20822 Contact with and (suspected) exposure to covid-19: Secondary | ICD-10-CM | POA: Diagnosis present

## 2020-07-15 DIAGNOSIS — Z818 Family history of other mental and behavioral disorders: Secondary | ICD-10-CM

## 2020-07-15 DIAGNOSIS — F17210 Nicotine dependence, cigarettes, uncomplicated: Secondary | ICD-10-CM | POA: Diagnosis present

## 2020-07-15 DIAGNOSIS — I10 Essential (primary) hypertension: Secondary | ICD-10-CM | POA: Diagnosis present

## 2020-07-15 DIAGNOSIS — Z79899 Other long term (current) drug therapy: Secondary | ICD-10-CM

## 2020-07-15 DIAGNOSIS — F1410 Cocaine abuse, uncomplicated: Secondary | ICD-10-CM | POA: Diagnosis present

## 2020-07-15 DIAGNOSIS — F332 Major depressive disorder, recurrent severe without psychotic features: Principal | ICD-10-CM | POA: Diagnosis present

## 2020-07-15 DIAGNOSIS — F1994 Other psychoactive substance use, unspecified with psychoactive substance-induced mood disorder: Secondary | ICD-10-CM | POA: Diagnosis present

## 2020-07-15 DIAGNOSIS — D649 Anemia, unspecified: Secondary | ICD-10-CM | POA: Diagnosis present

## 2020-07-15 DIAGNOSIS — E119 Type 2 diabetes mellitus without complications: Secondary | ICD-10-CM | POA: Diagnosis present

## 2020-07-15 DIAGNOSIS — F1424 Cocaine dependence with cocaine-induced mood disorder: Secondary | ICD-10-CM

## 2020-07-15 DIAGNOSIS — K219 Gastro-esophageal reflux disease without esophagitis: Secondary | ICD-10-CM | POA: Diagnosis present

## 2020-07-15 DIAGNOSIS — G4700 Insomnia, unspecified: Secondary | ICD-10-CM | POA: Diagnosis present

## 2020-07-15 DIAGNOSIS — E611 Iron deficiency: Secondary | ICD-10-CM | POA: Diagnosis present

## 2020-07-15 DIAGNOSIS — A6009 Herpesviral infection of other urogenital tract: Secondary | ICD-10-CM | POA: Diagnosis present

## 2020-07-15 DIAGNOSIS — Z7984 Long term (current) use of oral hypoglycemic drugs: Secondary | ICD-10-CM

## 2020-07-15 DIAGNOSIS — R45851 Suicidal ideations: Secondary | ICD-10-CM | POA: Diagnosis present

## 2020-07-15 DIAGNOSIS — F515 Nightmare disorder: Secondary | ICD-10-CM | POA: Diagnosis present

## 2020-07-15 LAB — CBC WITH DIFFERENTIAL/PLATELET
Abs Immature Granulocytes: 0.02 10*3/uL (ref 0.00–0.07)
Basophils Absolute: 0.1 10*3/uL (ref 0.0–0.1)
Basophils Relative: 1 %
Eosinophils Absolute: 0.2 10*3/uL (ref 0.0–0.5)
Eosinophils Relative: 2 %
HCT: 40 % (ref 36.0–46.0)
Hemoglobin: 12.2 g/dL (ref 12.0–15.0)
Immature Granulocytes: 0 %
Lymphocytes Relative: 28 %
Lymphs Abs: 2.7 10*3/uL (ref 0.7–4.0)
MCH: 22.9 pg — ABNORMAL LOW (ref 26.0–34.0)
MCHC: 30.5 g/dL (ref 30.0–36.0)
MCV: 75 fL — ABNORMAL LOW (ref 80.0–100.0)
Monocytes Absolute: 1.1 10*3/uL — ABNORMAL HIGH (ref 0.1–1.0)
Monocytes Relative: 11 %
Neutro Abs: 5.7 10*3/uL (ref 1.7–7.7)
Neutrophils Relative %: 58 %
Platelets: 343 10*3/uL (ref 150–400)
RBC: 5.33 MIL/uL — ABNORMAL HIGH (ref 3.87–5.11)
RDW: 21.9 % — ABNORMAL HIGH (ref 11.5–15.5)
WBC: 9.8 10*3/uL (ref 4.0–10.5)
nRBC: 0 % (ref 0.0–0.2)

## 2020-07-15 LAB — COMPREHENSIVE METABOLIC PANEL
ALT: 26 U/L (ref 0–44)
AST: 33 U/L (ref 15–41)
Albumin: 4.5 g/dL (ref 3.5–5.0)
Alkaline Phosphatase: 67 U/L (ref 38–126)
Anion gap: 10 (ref 5–15)
BUN: 7 mg/dL (ref 6–20)
CO2: 24 mmol/L (ref 22–32)
Calcium: 9.1 mg/dL (ref 8.9–10.3)
Chloride: 104 mmol/L (ref 98–111)
Creatinine, Ser: 0.61 mg/dL (ref 0.44–1.00)
GFR calc Af Amer: >60 mL/min (ref >60)
GFR calc non Af Amer: >60 mL/min (ref >60)
Glucose, Bld: 117 mg/dL — ABNORMAL HIGH (ref 70–99)
Potassium: 3.7 mmol/L (ref 3.5–5.1)
Sodium: 138 mmol/L (ref 135–145)
Total Bilirubin: 0.3 mg/dL (ref 0.3–1.2)
Total Protein: 8 g/dL (ref 6.5–8.1)

## 2020-07-15 LAB — MAGNESIUM: Magnesium: 2.1 mg/dL (ref 1.7–2.4)

## 2020-07-15 MED ORDER — FERROUS SULFATE 325 (65 FE) MG PO TABS
325.0000 mg | ORAL_TABLET | Freq: Every day | ORAL | Status: DC
  Administered 2020-07-16 – 2020-07-22 (×7): 325 mg via ORAL
  Filled 2020-07-15 (×9): qty 1

## 2020-07-15 MED ORDER — ACETAMINOPHEN 325 MG PO TABS
650.0000 mg | ORAL_TABLET | Freq: Four times a day (QID) | ORAL | Status: DC | PRN
  Administered 2020-07-15 – 2020-07-19 (×6): 650 mg via ORAL
  Filled 2020-07-15 (×6): qty 2

## 2020-07-15 MED ORDER — SENNOSIDES-DOCUSATE SODIUM 8.6-50 MG PO TABS: 1.0000 | ORAL_TABLET | Freq: Every day | ORAL | Status: DC | PRN

## 2020-07-15 MED ORDER — MAGNESIUM OXIDE 400 (241.3 MG) MG PO TABS
400.0000 mg | ORAL_TABLET | ORAL | Status: AC
  Administered 2020-07-15: 400 mg via ORAL
  Filled 2020-07-15: qty 1

## 2020-07-15 MED ORDER — ALUM & MAG HYDROXIDE-SIMETH 200-200-20 MG/5ML PO SUSP
30.0000 mL | ORAL | Status: DC | PRN
  Filled 2020-07-15: qty 30

## 2020-07-15 MED ORDER — GABAPENTIN 300 MG PO CAPS: 300.0000 mg | ORAL_CAPSULE | Freq: Three times a day (TID) | ORAL | Status: DC

## 2020-07-15 MED ORDER — PANTOPRAZOLE SODIUM 40 MG PO TBEC
40.0000 mg | DELAYED_RELEASE_TABLET | Freq: Every day | ORAL | Status: DC
  Administered 2020-07-16 – 2020-07-23 (×8): 40 mg via ORAL
  Filled 2020-07-15: qty 7
  Filled 2020-07-15 (×9): qty 1

## 2020-07-15 MED ORDER — OLANZAPINE 5 MG PO TABS
5.0000 mg | ORAL_TABLET | Freq: Every day | ORAL | Status: DC
  Administered 2020-07-15 – 2020-07-19 (×5): 5 mg via ORAL
  Filled 2020-07-15 (×4): qty 1
  Filled 2020-07-15: qty 2
  Filled 2020-07-15 (×3): qty 1
  Filled 2020-07-15: qty 2

## 2020-07-15 MED ORDER — NICOTINE 21 MG/24HR TD PT24
21.0000 mg | MEDICATED_PATCH | Freq: Every day | TRANSDERMAL | Status: DC
  Administered 2020-07-16 – 2020-07-22 (×7): 21 mg via TRANSDERMAL
  Filled 2020-07-15 (×12): qty 1

## 2020-07-15 MED ORDER — HYDROXYZINE HCL 25 MG PO TABS
25.0000 mg | ORAL_TABLET | Freq: Three times a day (TID) | ORAL | Status: DC | PRN
  Administered 2020-07-15 – 2020-07-22 (×13): 25 mg via ORAL
  Filled 2020-07-15: qty 10
  Filled 2020-07-15 (×13): qty 1

## 2020-07-15 MED ORDER — PANTOPRAZOLE SODIUM 40 MG PO TBEC
40.0000 mg | DELAYED_RELEASE_TABLET | Freq: Every day | ORAL | Status: DC
  Administered 2020-07-15: 40 mg via ORAL
  Filled 2020-07-15: qty 1

## 2020-07-15 MED ORDER — AMLODIPINE BESYLATE 10 MG PO TABS
10.0000 mg | ORAL_TABLET | Freq: Every day | ORAL | Status: DC
  Administered 2020-07-16 – 2020-07-23 (×8): 10 mg via ORAL
  Filled 2020-07-15 (×6): qty 1
  Filled 2020-07-15: qty 7
  Filled 2020-07-15 (×3): qty 1

## 2020-07-15 MED ORDER — HYDRALAZINE HCL 10 MG PO TABS
10.0000 mg | ORAL_TABLET | Freq: Once | ORAL | Status: AC
  Administered 2020-07-15: 10 mg via ORAL
  Filled 2020-07-15: qty 1

## 2020-07-15 MED ORDER — METFORMIN HCL 500 MG PO TABS
500.0000 mg | ORAL_TABLET | Freq: Every day | ORAL | Status: DC
  Filled 2020-07-15: qty 1

## 2020-07-15 MED ORDER — ACETAMINOPHEN 500 MG PO TABS
1000.0000 mg | ORAL_TABLET | Freq: Once | ORAL | Status: AC
  Administered 2020-07-15: 1000 mg via ORAL
  Filled 2020-07-15: qty 2

## 2020-07-15 MED ORDER — VENLAFAXINE HCL ER 150 MG PO CP24
150.0000 mg | ORAL_CAPSULE | Freq: Every day | ORAL | Status: DC
  Administered 2020-07-16 – 2020-07-17 (×2): 150 mg via ORAL
  Filled 2020-07-15 (×4): qty 1

## 2020-07-15 MED ORDER — MAGNESIUM HYDROXIDE 400 MG/5ML PO SUSP: 30.0000 mL | Freq: Every day | ORAL | Status: DC | PRN

## 2020-07-15 MED ORDER — VENLAFAXINE HCL ER 75 MG PO CP24: 150.0000 mg | ORAL_CAPSULE | Freq: Every day | ORAL | Status: DC

## 2020-07-15 MED ORDER — FERROUS SULFATE 325 (65 FE) MG PO TABS: 325.0000 mg | ORAL_TABLET | Freq: Every day | ORAL | Status: DC

## 2020-07-15 MED ORDER — METFORMIN HCL 500 MG PO TABS
500.0000 mg | ORAL_TABLET | Freq: Every day | ORAL | Status: DC
  Filled 2020-07-15 (×2): qty 1
  Filled 2020-07-15: qty 7
  Filled 2020-07-15 (×7): qty 1

## 2020-07-15 MED ORDER — TRAZODONE HCL 50 MG PO TABS
50.0000 mg | ORAL_TABLET | Freq: Every evening | ORAL | Status: DC | PRN
  Administered 2020-07-15 – 2020-07-20 (×6): 50 mg via ORAL
  Filled 2020-07-15 (×6): qty 1

## 2020-07-15 NOTE — ED Notes (Signed)
This RN spoke with Poison Control; they recommend repeating an EKG now, and if the QTc interval is over 500, then check a potassium and magnesium level to make sure they're within normal limits. Poison control will call back to reassess patient in a few hours.

## 2020-07-15 NOTE — ED Notes (Signed)
Assumed care at this time. Pt resting comfortably. She woke up and complained of a headache, but she went back to sleep soon after. VS stable except her BP is high (165/106)

## 2020-07-15 NOTE — BH Assessment (Signed)
Pt is too somnolent to participate in TTS assessment at this time.   [name], [facility], [facility] Triage Specialist [phone]

## 2020-07-15 NOTE — Progress Notes (Addendum)
°   07/15/20 2050  Psych Admission Type (Psych Patients Only)  Admission Status Involuntary  Psychosocial Assessment  Patient Complaints Depression;Crying spells  Eye Contact Brief  Facial Expression Sad;Pained  Affect Depressed  Speech Slow  Interaction Assertive  Appearance/Hygiene Unremarkable;In scrubs  Behavior Characteristics Cooperative;Irritable;Anxious  Mood Depressed  Thought Process  Coherency WDL  Content WDL  Delusions WDL  Perception WDL  Hallucination None reported or observed  Judgment Impaired  Confusion None  Danger to Self  Current suicidal ideation? Denies  Danger to Others  Danger to Others None reported or observed   Pt states her last drink was Tuesday 07/09/20 and her last meth use was Friday 07/12/20.

## 2020-07-15 NOTE — ED Notes (Signed)
Pt to 30. Pt oriented to unit and room. Pt irritable. Pt eating dinner.

## 2020-07-15 NOTE — BH Assessment (Signed)
[place] Assessment Progress Note   Clary MD has recommended a inpatient admission to assist with stabilization.

## 2020-07-15 NOTE — BH Assessment (Signed)
[place] Assessment Progress Note  Per Myles Lipps, MD this pt requires psychiatric hospitalization at this time.  Pt presents under IVC initiated by EDP Lajean Saver, MD.  This writer has reached out to [facility], which cannot currently accommodate pt.  Pt is currently under consideration at [facility], and as a backup plan, [facility].  Please note that if she is sent to [facility], her IVC must first be rescinded.  Final disposition is pending as of this writing.  Jalene Mullet, [place] Coordinator [phone]
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
## 2020-07-15 NOTE — Progress Notes (Addendum)
CSW received a call from [place] at [facility] stating the patient has been offered a bed and has been accepted and that the pt can arrive after 7:30pm.  The pt's accepting doctor is Dr. Mallie Darting.  The room number will be [address].  The number for report is [phone].  CSW will update RN and EDP.   5:00 PM RN/EDP updated.  Alphonse Guild. Felissa Blouch, Latanya Presser, LCAS Clinical Social Worker Ph: [phone]

## 2020-07-15 NOTE — ED Provider Notes (Signed)
Accepted handoff at shift change from [facility]. Please see prior provider note for more detail.   Briefly: Patient is 44 y.o. with past medical history significant for substance abuse, hypertension, reflux, alcohol abuse, depression, anxiety.  She is presented emergency department for overdose after ingestion of Remeron and gabapentin.  Patient has been alert and orientated during ED visit.  She has been here for 16 hours already.  She has been observed but is hypertensive.  History of hypertension and has not been taking her blood pressure medications.  No headache, chest pain or shortness of breath.  No numbness or weakness or focal neuro deficits.  Poison control contacted and recommendations were followed up on.  At time of handoff patient is awaiting completion of lab work and being evaluated for her elevated blood pressure.    Physical Exam  BP (!) 164/113   Pulse 98   Temp 98.3 F (36.8 C)   Resp 20   Ht 5\' 1"  (1.549 m)   Wt 75.3 kg   SpO2 95%   BMI 31.37 kg/m   CONSTITUTIONAL:  well-appearing, NAD NEURO: Resting in bed.  Prior neurologic exam was within normal limits and per nursing staff she is been mentating at baseline. EYES:  pupils equal and reactive ENT/NECK:  trachea midline, no JVD CARDIO:  reg rate, reg rhythm, well-perfused PULM:  None labored breathing GI/GU:  Abdomen non-distended MSK/SPINE:  No gross deformities, no edema SKIN:  no rash obvious, atraumatic, no ecchymosis  PSYCH:   Asleep in bed.   ED Course/Procedures     Procedures  Results for orders placed or performed during the hospital encounter of 07/14/20  SARS Coronavirus 2 by RT PCR (hospital order, performed in [facility] hospital lab) Nasopharyngeal Nasopharyngeal Swab   Specimen: Nasopharyngeal Swab  Result Value Ref Range   SARS Coronavirus 2 NEGATIVE NEGATIVE  Comprehensive metabolic panel  Result Value Ref Range   Sodium 138 135 - 145 mmol/L   Potassium 3.2 (L) 3.5 -  5.1 mmol/L   Chloride 103 98 - 111 mmol/L   CO2 24 22 - 32 mmol/L   Glucose, Bld 108 (H) 70 - 99 mg/dL   BUN 6 6 - 20 mg/dL   Creatinine, Ser 0.65 0.44 - 1.00 mg/dL   Calcium 9.1 8.9 - 10.3 mg/dL   Total Protein 7.6 6.5 - 8.1 g/dL   Albumin 4.3 3.5 - 5.0 g/dL   AST 35 15 - 41 U/L   ALT 28 0 - 44 U/L   Alkaline Phosphatase 59 38 - 126 U/L   Total Bilirubin 0.3 0.3 - 1.2 mg/dL   GFR calc non Af Amer >60 >60 mL/min   GFR calc Af Amer >60 >60 mL/min   Anion gap 11 5 - 15  Ethanol  Result Value Ref Range   Alcohol, Ethyl (B) <29 <56 mg/dL  Salicylate level  Result Value Ref Range   Salicylate Lvl <2.1 (L) 7.0 - 30.0 mg/dL  CBC with Differential  Result Value Ref Range   WBC 9.6 4.0 - 10.5 K/uL   RBC 5.05 3.87 - 5.11 MIL/uL   Hemoglobin 11.5 (L) 12.0 - 15.0 g/dL   HCT 37.7 36 - 46 %   MCV 74.7 (L) 80.0 - 100.0 fL   MCH 22.8 (L) 26.0 - 34.0 pg   MCHC 30.5 30.0 - 36.0 g/dL   RDW 22.1 (H) 11.5 - 15.5 %   Platelets 338 150 - 400 K/uL   nRBC 0.0 0.0 -  0.2 %   Neutrophils Relative % 60 %   Neutro Abs 5.8 1.7 - 7.7 K/uL   Lymphocytes Relative 25 %   Lymphs Abs 2.4 0.7 - 4.0 K/uL   Monocytes Relative 12 %   Monocytes Absolute 1.2 (H) 0 - 1 K/uL   Eosinophils Relative 2 %   Eosinophils Absolute 0.2 0 - 0 K/uL   Basophils Relative 1 %   Basophils Absolute 0.1 0 - 0 K/uL   RBC Morphology MORPHOLOGY UNREMARKABLE    Immature Granulocytes 0 %   Abs Immature Granulocytes 0.02 0.00 - 0.07 K/uL  Acetaminophen level  Result Value Ref Range   Acetaminophen (Tylenol), Serum <10 (L) 10 - 30 ug/mL  Urine rapid drug screen (hosp performed)  Result Value Ref Range   Opiates NONE DETECTED NONE DETECTED   Cocaine NONE DETECTED NONE DETECTED   Benzodiazepines NONE DETECTED NONE DETECTED   Amphetamines POSITIVE (A) NONE DETECTED   Tetrahydrocannabinol NONE DETECTED NONE DETECTED   Barbiturates NONE DETECTED NONE DETECTED  Acetaminophen level  Result Value Ref Range   Acetaminophen  (Tylenol), Serum <10 (L) 10 - 30 ug/mL  CBC with Differential  Result Value Ref Range   WBC 9.8 4.0 - 10.5 K/uL   RBC 5.33 (H) 3.87 - 5.11 MIL/uL   Hemoglobin 12.2 12.0 - 15.0 g/dL   HCT 40.0 36 - 46 %   MCV 75.0 (L) 80.0 - 100.0 fL   MCH 22.9 (L) 26.0 - 34.0 pg   MCHC 30.5 30.0 - 36.0 g/dL   RDW 21.9 (H) 11.5 - 15.5 %   Platelets 343 150 - 400 K/uL   nRBC 0.0 0.0 - 0.2 %   Neutrophils Relative % 58 %   Neutro Abs 5.7 1.7 - 7.7 K/uL   Lymphocytes Relative 28 %   Lymphs Abs 2.7 0.7 - 4.0 K/uL   Monocytes Relative 11 %   Monocytes Absolute 1.1 (H) 0 - 1 K/uL   Eosinophils Relative 2 %   Eosinophils Absolute 0.2 0 - 0 K/uL   Basophils Relative 1 %   Basophils Absolute 0.1 0 - 0 K/uL   Immature Granulocytes 0 %   Abs Immature Granulocytes 0.02 0.00 - 0.07 K/uL  Comprehensive metabolic panel  Result Value Ref Range   Sodium 138 135 - 145 mmol/L   Potassium 3.7 3.5 - 5.1 mmol/L   Chloride 104 98 - 111 mmol/L   CO2 24 22 - 32 mmol/L   Glucose, Bld 117 (H) 70 - 99 mg/dL   BUN 7 6 - 20 mg/dL   Creatinine, Ser 0.61 0.44 - 1.00 mg/dL   Calcium 9.1 8.9 - 10.3 mg/dL   Total Protein 8.0 6.5 - 8.1 g/dL   Albumin 4.5 3.5 - 5.0 g/dL   AST 33 15 - 41 U/L   ALT 26 0 - 44 U/L   Alkaline Phosphatase 67 38 - 126 U/L   Total Bilirubin 0.3 0.3 - 1.2 mg/dL   GFR calc non Af Amer >60 >60 mL/min   GFR calc Af Amer >60 >60 mL/min   Anion gap 10 5 - 15  Magnesium  Result Value Ref Range   Magnesium 2.1 1.7 - 2.4 mg/dL  I-Stat beta hCG blood, ED  Result Value Ref Range   I-stat hCG, quantitative <5.0 <5 mIU/mL   Comment 3           No results found.   MDM    I reviewed patient's most recent lab work as  well of her initial lab work that was obtained in triage.  Patient has CBC without significant derangements.  No significant changes either.  CMP now with normal potassium.  Magnesium is within normal limits will provide patient with a small dose of p.o. magnesium and to hopefully  ameliorate her mildly elevated QT. Her Covid swab is negative.  Tylenol and salicylate levels are negative.  ICG is negative doubt pregnancy.  Urine drug screen is positive for amphetamines.   Patient's blood pressure is now 132/91.  She has no evidence of endorgan damage.  She appears to have metabolized her overdose medications. I will order the patient's home blood pressure medications at home medications.  She is medically cleared at this time.  TTS pending.   Pati Gallo [place], [place] 07/15/20 0900    Quintella Reichert, MD 07/15/20 1236

## 2020-07-15 NOTE — Progress Notes (Signed)
Patient has been accepted to [facility]. Patient to be transported after report to night shift (after 7:30 p.m.). Call report to [phone]. [place], [facility]
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
## 2020-07-15 NOTE — BH Assessment (Signed)
Assessment Note  [name] is an 44 y.o. female. Patient presents this date after overdosing on gabapentin. Patient continues to voice active S/I and cannot contract for safety. Patient denies any H/I or AVH. Patient is observed to be drowsy at the time of the assessment and renders limited history. Patient was last seen on 05/18/20 when she presented to [facility] with S/I with a plan to "run her car into a tree or put a gun in her mouth." Patient per that note had been receiving medications for ongoing mental health issues from [place]. Patient has ongoing SA issues per chart review although is vague this date in reference to current use. Per Mallie Darting MD note this date who also evlauted patient. Patient is seen and examined. Patient is a 44 year old female with a past psychiatric history significant for depression, anxiety, substance abuse. She presented to the [facility] emergency department on 07/14/2020 after an attempted overdose of taking 10-15 gabapentin.  She was apparently discharged from an inpatient facility on the previous Tuesday. Review of the electronic medical record revealed she had been seen at [facility]. She had been discharged with a diagnosis of major depression, cannabis use disorder, cocaine use disorder and alcohol use disorder. She had been placed on gabapentin, lorazepam, mirtazapine and Protonix. Patient stated that she had relapsed on substances basically after she had gotten out of the facility. She stated she had taken an overdose of the gabapentin. In the emergency department she continued to express suicidal ideation. Her family had called EMS secondary to concern for the patient's safety. During the interview today the patient was unable to contract for safety. Because of the recent hospitalization and her continued suicidal ideation it was felt we should admit her to the hospital. Patient is observed to be drowsy and renders limited information this date.  Clary MD has recommended a inpatient admission to assist with stabilization.   Diagnosis: Substance induced mood disorder  Past Medical History:  Past Medical History:  Diagnosis Date  . Abnormal Pap smear 1998   had colpo  . Anxiety   . Asthma    ,as child only   no inhaler  . Bicornate uterus   . Bicornuate uterus   . Chlamydia 2000  . GERD (gastroesophageal reflux disease)   . HCV infection    HX of Hep C  . Headache(784.0)   . Hypertension 2010  . IUP (intrauterine pregnancy), incidental 13 weeks  . Opiate dependence ([place])    stopped methadone 01/2011  . PONV (postoperative nausea and vomiting)   . STD (female)    HX of STD's    Past Surgical History:  Procedure Laterality Date  . BILATERAL SALPINGECTOMY  12/06/2012   Procedure: BILATERAL SALPINGECTOMY;  Surgeon: Mora Bellman, MD;  Location: [facility];  Service: Gynecology;  Laterality: Bilateral;  laparoscopic  . CERVICAL CERCLAGE  09/01/2011   Procedure: CERCLAGE CERVICAL;  Surgeon: Donnamae Jude, MD;  Location: [facility];  Service: Gynecology;  Laterality: N/A;  . CERVICAL CERCLAGE  06/28/2012   Procedure: CERCLAGE CERVICAL;  Surgeon: Donnamae Jude, MD;  Location: [facility];  Service: Gynecology;  Laterality: N/A;  . DILATION AND CURETTAGE OF UTERUS     SAB  . FINGER SURGERY  2003   reattachment of right forefinger  . THERAPEUTIC ABORTION    . THERAPEUTIC ABORTION    . TONSILLECTOMY    . TUBAL LIGATION  11/23/2012    Family History:  Family History  Problem Relation Age  of Onset  . Hypertension Mother   . Mental illness Mother        depression; alot of mental illness.  . Cancer Father 30       Colon cancer age 90  . Thyroid disease Maternal Grandmother   . Diabetes Paternal Grandfather   . Anesthesia problems Neg Hx   . Other Neg Hx     Social History:  reports that she has been smoking cigarettes. She started smoking about 6 years ago. She has a 10.00 pack-year smoking history. She has never used smokeless  tobacco. She reports current alcohol use. She reports previous drug use. Drugs: Cocaine, IV, and Marijuana.  Additional Social History:  Alcohol / Drug Use Pain Medications: See MAR Prescriptions: See MAR Over the Counter: See MAR History of alcohol / drug use?: Yes Longest period of sobriety (when/how long): Unknown Negative Consequences of Use:  (Denies) Withdrawal Symptoms:  (Denies) Substance #1 Name of Substance 1: Opiates per hx 1 - Age of First Use: UTA 1 - Amount (size/oz): UTA 1 - Frequency: UTA 1 - Duration: UTA 1 - Last Use / Amount: UTA  CIWA: CIWA-Ar BP: (!) 119/91 Pulse Rate: 80 COWS:    Allergies: No Known Allergies  Home Medications: (Not in a hospital admission)   OB/GYN Status:  No LMP recorded. (Menstrual status: Other).  General Assessment Data Location of Assessment: [facility] TTS Assessment: In system Is this a Tele or Face-to-Face Assessment?: Face-to-Face Is this an Initial Assessment or a Re-assessment for this encounter?: Initial Assessment Patient Accompanied by:: N/A Language Other than English: No Living Arrangements: Other (Comment) What gender do you identify as?: Female Date Telepsych consult ordered in [facility]: 07/15/20 Marital status: Single Maiden name: NA Pregnancy Status: No Living Arrangements: Alone Can pt return to current living arrangement?: Yes Admission Status: Involuntary Petitioner: ED Attending Is patient capable of signing voluntary admission?: Yes Referral Source: Self/Family/Friend Insurance type: Medicaid  Medical Screening Exam ([place]) Medical Exam completed: Yes  Crisis Care Plan Living Arrangements: Alone Legal Guardian:  (NA) Name of Psychiatrist:  ([place]) Name of Therapist:  (UTA)  Education Status Is patient currently in school?: No Is the patient employed, unemployed or receiving disability?: Unemployed  Risk to self with the past 6 months Suicidal Ideation: Yes-Currently Present Has patient  been a risk to self within the past 6 months prior to admission? : Yes Suicidal Intent: No Has patient had any suicidal intent within the past 6 months prior to admission? : No Is patient at risk for suicide?: Yes Suicidal Plan?: No Has patient had any suicidal plan within the past 6 months prior to admission? : No Access to Means: No What has been your use of drugs/alcohol within the last 12 months?: Current use Previous Attempts/Gestures: No How many times?: 0 Other Self Harm Risks: NA Triggers for Past Attempts:  (NA) Intentional Self Injurious Behavior: None Family Suicide History: No Recent stressful life event(s): Other (Comment) Persecutory voices/beliefs?: No Depression: Yes Depression Symptoms: Fatigue, Guilt Substance abuse history and/or treatment for substance abuse?: No Suicide prevention information given to non-admitted patients: Not applicable  Risk to Others within the past 6 months Homicidal Ideation: No Does patient have any lifetime risk of violence toward others beyond the six months prior to admission? : No Thoughts of Harm to Others: No Current Homicidal Intent: No Current Homicidal Plan: No Access to Homicidal Means: No Identified Victim: NA History of harm to others?: No Assessment of Violence: None Noted  Violent Behavior Description: NA Does patient have access to weapons?: No Criminal Charges Pending?: No Does patient have a court date: No Is patient on probation?: No  Psychosis Hallucinations: None noted Delusions: None noted  Mental Status Report Appearance/Hygiene: Unremarkable Eye Contact: Fair Motor Activity: Freedom of movement Speech: Slow Level of Consciousness: Sleeping Mood: Depressed Affect: Appropriate to circumstance Anxiety Level: Minimal Thought Processes: Unable to Assess Judgement: Unable to Assess Orientation: Unable to assess Obsessive Compulsive Thoughts/Behaviors: Unable to Assess  Cognitive  Functioning Concentration: Unable to Assess Memory: Unable to Assess Is patient IDD: No Insight: Unable to Assess Impulse Control: Unable to Assess Appetite:  (UTA) Have you had any weight changes? :  (UTA) Sleep:  (UTA) Total Hours of Sleep:  (UTA) Vegetative Symptoms: Unable to Assess  ADLScreening [facility] Assessment Services) Patient's cognitive ability adequate to safely complete daily activities?: Yes Patient able to express need for assistance with ADLs?: Yes Independently performs ADLs?: Yes (appropriate for developmental age)  Prior Inpatient Therapy Prior Inpatient Therapy: Yes Prior Therapy Dates: 2021 Prior Therapy Facilty/Provider(s): [facility] Reason for Treatment: MH issues  Prior Outpatient Therapy Prior Outpatient Therapy: No Does patient have an ACCT team?: No Does patient have Intensive In-House Services?  : No Does patient have [facility] services? : No Does patient have P4CC services?: No  ADL Screening (condition at time of admission) Patient's cognitive ability adequate to safely complete daily activities?: Yes Is the patient deaf or have difficulty hearing?: No Does the patient have difficulty seeing, even when wearing glasses/contacts?: No Does the patient have difficulty concentrating, remembering, or making decisions?: No Patient able to express need for assistance with ADLs?: Yes Does the patient have difficulty dressing or bathing?: No Independently performs ADLs?: Yes (appropriate for developmental age) Does the patient have difficulty walking or climbing stairs?: No Weakness of Legs: None Weakness of Arms/Hands: None  Home Assistive Devices/Equipment Home Assistive Devices/Equipment: None  Therapy Consults (therapy consults require a physician order) PT Evaluation Needed: No OT Evalulation Needed: No SLP Evaluation Needed: No Abuse/Neglect Assessment (Assessment to be complete while patient is alone) Abuse/Neglect Assessment Can Be Completed:  Yes Physical Abuse: Denies Verbal Abuse: Denies Sexual Abuse: Denies Exploitation of patient/patient's resources: Denies Self-Neglect: Denies Values / Beliefs Cultural Requests During Hospitalization: None Spiritual Requests During Hospitalization: None Consults Spiritual Care Consult Needed: No Transition of Care Team Consult Needed: No Advance Directives (For Healthcare) Does Patient Have a Medical Advance Directive?: No Would patient like information on creating a medical advance directive?: No - Patient declined          Disposition: Clary MD has recommended a inpatient admission to assist with stabilization.   Disposition Initial Assessment Completed for this Encounter: Yes Disposition of Patient: Admit Type of inpatient treatment program: Adult  On Site Evaluation by:   Reviewed with Physician:    Mamie Nick 07/15/2020 3:07 PM

## 2020-07-15 NOTE — Consult Note (Signed)
  Patient is seen and examined.  Patient is a 44 year old female with a past psychiatric history significant for depression, anxiety, substance abuse.  She presented to the [facility] emergency department on 07/14/2020 after an attempted overdose of taking 10-15 gabapentin.  She was apparently discharged from an inpatient facility on the previous Tuesday.  Review of the electronic medical record revealed she had been seen at [facility].  She had been discharged with a diagnosis of major depression, cannabis use disorder, cocaine use disorder and alcohol use disorder.  She had been placed on gabapentin, lorazepam, mirtazapine and Protonix.  Patient stated that she had relapsed on substances basically after she had gotten out of the facility.  She stated she had taken an overdose of the gabapentin.  In the emergency department she continued to express suicidal ideation.  Her family had called EMS secondary to concern for the patient's safety.  During the interview today the patient was unable to contract for safety.  Because of the recent hospitalization and her continued suicidal ideation it was felt we should admit her to the hospital.  Review of her laboratories revealed essentially normal electrolytes and liver function enzymes.  Her CBC revealed a decreased MCV at 75 and an MCH of 22.9.  Acetaminophen was less than 10, salicylate was less than 7.  Beta-hCG was less than 5.  Her drug screen was positive for amphetamines.  We will attempt to get her readmitted to the [place] [facility] facility.

## 2020-07-16 DIAGNOSIS — F1994 Other psychoactive substance use, unspecified with psychoactive substance-induced mood disorder: Secondary | ICD-10-CM
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
## 2020-07-16 NOTE — Progress Notes (Signed)
Pt states that she is sweating a lot. When asked if it could be from the methamphetamines, pt stated, "I don't think so. There aren't a lot of withdrawal (symptoms) from meth." Pt states the temp in room is fine. Will continue to monitor.

## 2020-07-16 NOTE — Progress Notes (Signed)
Psychoeducational Group Note  Date:  07/16/2020 Time:  2136  Group Topic/Focus:  Wrap-Up Group:   The focus of this group is to help patients review their daily goal of treatment and discuss progress on daily workbooks.  Participation Level: Did Not Attend  Participation Quality:  Not Applicable  Affect:  Not Applicable  Cognitive:  Not Applicable  Insight:  Not Applicable  Engagement in Group: Not Applicable  Additional Comments:  Patient did not attend group this evening.   Archie Balboa S 07/16/2020, 9:36 PM

## 2020-07-16 NOTE — BHH Counselor (Addendum)
CSW faxed clinicals to [facility] for intake review.   350:Sarah from intake at [facility] informed she received referral. States they may have a female bed at some point but with no definitive timeline. Also explained that pt has been there before in the past and would need to be staffed with their tx team before deciding whether or not she could return.

## 2020-07-16 NOTE — Progress Notes (Signed)
Pt has been mainly isolative to her room tonight. Pt appears really sad and depressed tonight. Pt said she had a "terrible" day. When pt was asked about what made her day terrible, pt didn't elaborate. Pt's speech is soft and slow. Pt is minimal and guarded upon interactions. Pt administered her PRN vistaril for anxiety and trazadone for sleep tonight. Pt was asked if she was in any pain and said she was having generalized pain all over. Pt administered her PRN tylenol. Pt denies SI/HI and AVH. Active listening, reassurance, and support provided. Q 15 min safety checks continue. Pt's safety has been maintained.    07/16/20 2125  Psych Admission Type (Psych Patients Only)  Admission Status Involuntary  Psychosocial Assessment  Patient Complaints Anxiety;Depression;Sadness  Eye Contact Avoids;Brief  Facial Expression Sad;Sullen;Pained  Affect Depressed;Anxious;Sad  Speech Slow  Interaction Forwards little;Guarded;Minimal  Motor Activity Slow  Appearance/Hygiene Unremarkable  Behavior Characteristics Cooperative;Anxious;Guarded  Mood Depressed;Anxious;Sad  Thought Process  Coherency WDL  Content WDL  Delusions None reported or observed  Perception WDL  Hallucination None reported or observed  Judgment Impaired  Confusion None  Danger to Self  Current suicidal ideation? Denies  Danger to Others  Danger to Others None reported or observed

## 2020-07-16 NOTE — BHH Suicide Risk Assessment (Signed)
[facility] INPATIENT:  Family/Significant Other Suicide Prevention Education  Suicide Prevention Education:  Patient Refusal for Family/Significant Other Suicide Prevention Education: The patient [name] has refused to provide written consent for family/significant other to be provided Family/Significant Other Suicide Prevention Education during admission and/or prior to discharge.  Physician notified.  Kaila Devries T Special Ranes 07/16/2020, 10:07 AM

## 2020-07-16 NOTE — H&P (Signed)
Psychiatric Admission Assessment Adult  Patient Identification: [name] MRN:  [id number] Date of Evaluation:  07/16/2020 Chief Complaint:  Substance induced mood disorder ([place]) [F19.94] Principal Diagnosis: <principal problem not specified> Diagnosis:  Active Problems:   Substance induced mood disorder ([place])  History of Present Illness: Patient is seen and examined.  Patient is a 44 year old female with a past psychiatric history significant for polysubstance dependence as well as depression.  The patient presented to the [facility] emergency department on 07/15/2020 with suicidal ideation.  She stated her plan was to run a car into a tree, or put a gun in her mouth.  She had just been discharged from [facility] 2 days prior to admission.  She had been placed on gabapentin, lorazepam, mirtazapine as well as Protonix.  She stated that she had relapsed on substances as soon as she had left that facility.  She had apparently overused her gabapentin.  She was seen by me and staff in the emergency department at [facility] on 07/15/2020 and the patient continued to be suicidal.  Attempts to communicate with [facility] for sign to have the patient transferred back to their facility were not successful and she was admitted here to the hospital.  With regard to her substance use issues her drug screen was positive for amphetamines, but otherwise negative.  Blood alcohol was less than 10.  The patient stated that her goal is to get into a rehabilitation program, and then attempt to get into a long-term program.  Associated Signs/Symptoms: Depression Symptoms:  depressed mood, anhedonia, hypersomnia, fatigue, feelings of worthlessness/guilt, difficulty concentrating, hopelessness, suicidal thoughts with specific plan, anxiety, loss of energy/fatigue, disturbed sleep, weight loss, (Hypo) Manic Symptoms:  Impulsivity, Irritable Mood, Labiality of  Mood, Anxiety Symptoms:  Excessive Worry, Psychotic Symptoms:  Denied PTSD Symptoms: Negative Total Time spent with patient: 45 minutes  Past Psychiatric History: Patient stated that she had just been discharged from the [facility] facility just a few days prior to presenting to the [facility] emergency department.  Review of the electronic medical record revealed she had originally presented there on 07/02/2020.  Diagnosis was major depression, cannabis use disorder and cocaine use disorder.  She was discharged on 07/09/2020.  Her last admission to our facility was on 03/22/2020.  She was hospitalized 5 days.  She was discharged on venlafaxine, Protonix, Zyprexa, hydroxyzine and amlodipine.  She stated that she has had multiple psychiatric hospitalizations in the past and been on multiple antidepressant medications.  The notes suggest that she was admitted for detox from methamphetamines at [facility] the week prior to the admission at [facility].  Is the patient at risk to self? Yes.    Has the patient been a risk to self in the past 6 months? Yes.    Has the patient been a risk to self within the distant past? Yes.    Is the patient a risk to others? No.  Has the patient been a risk to others in the past 6 months? No.  Has the patient been a risk to others within the distant past? No.   Prior Inpatient Therapy:   Prior Outpatient Therapy:    Alcohol Screening: 1. How often do you have a drink containing alcohol?: 4 or more times a week 2. How many drinks containing alcohol do you have on a typical day when you are drinking?: 5 or 6 3. How often do you have six or more drinks  on one occasion?: Weekly AUDIT-C Score: 9 4. How often during the last year have you found that you were not able to stop drinking once you had started?: Weekly 5. How often during the last year have you failed to do what was normally expected from you because of drinking?: Monthly 6. How often during the  last year have you needed a first drink in the morning to get yourself going after a heavy drinking session?: Weekly 7. How often during the last year have you had a feeling of guilt of remorse after drinking?: Weekly 8. How often during the last year have you been unable to remember what happened the night before because you had been drinking?: Weekly 9. Have you or someone else been injured as a result of your drinking?: No 10. Has a relative or friend or a doctor or another health worker been concerned about your drinking or suggested you cut down?: No Alcohol Use Disorder Identification Test Final Score (AUDIT): 23 Alcohol Brief Interventions/Follow-up: Patient Refused Substance Abuse History in the last 12 months:  Yes.   Consequences of Substance Abuse: Medical Consequences:  Clearly has been related to her psychiatric hospitalizations within the last 6 months. Previous Psychotropic Medications: Yes  Psychological Evaluations: Yes  Past Medical History:  Past Medical History:  Diagnosis Date   Abnormal Pap smear 1998   had colpo   Anxiety    Asthma    ,as child only   no inhaler   Bicornate uterus    Bicornuate uterus    Chlamydia 2000   GERD (gastroesophageal reflux disease)    HCV infection    HX of Hep C   Headache(784.0)    Hypertension 2010   IUP (intrauterine pregnancy), incidental 13 weeks   Opiate dependence ([place])    stopped methadone 01/2011   PONV (postoperative nausea and vomiting)    STD (female)    HX of STD's    Past Surgical History:  Procedure Laterality Date   BILATERAL SALPINGECTOMY  12/06/2012   Procedure: BILATERAL SALPINGECTOMY;  Surgeon: Mora Bellman, MD;  Location: [facility];  Service: Gynecology;  Laterality: Bilateral;  laparoscopic   CERVICAL CERCLAGE  09/01/2011   Procedure: CERCLAGE CERVICAL;  Surgeon: Donnamae Jude, MD;  Location: [facility];  Service: Gynecology;  Laterality: N/A;   CERVICAL CERCLAGE  06/28/2012   Procedure:  CERCLAGE CERVICAL;  Surgeon: Donnamae Jude, MD;  Location: [facility];  Service: Gynecology;  Laterality: N/A;   DILATION AND CURETTAGE OF UTERUS     SAB   FINGER SURGERY  2003   reattachment of right forefinger   THERAPEUTIC ABORTION     THERAPEUTIC ABORTION     TONSILLECTOMY     TUBAL LIGATION  11/23/2012   Family History:  Family History  Problem Relation Age of Onset   Hypertension Mother    Mental illness Mother        depression; alot of mental illness.   Cancer Father 26       Colon cancer age 39   Thyroid disease Maternal Grandmother    Diabetes Paternal Grandfather    Anesthesia problems Neg Hx    Other Neg Hx    Family Psychiatric  History: Multiple family members with addiction problems. Tobacco Screening: Have you used any form of tobacco in the last 30 days? (Cigarettes, Smokeless Tobacco, Cigars, and/or Pipes): Yes Tobacco use, Select all that apply: 5 or more cigarettes per day Are you interested in Tobacco Cessation Medications?: Yes, will  notify MD for an order Social History:  Social History   Substance and Sexual Activity  Alcohol Use Yes   Comment: at least 6 shots per day most days; Last drink last  Tuesday 07/09/20     Social History   Substance and Sexual Activity  Drug Use Not Currently   Types: Cocaine, IV, Marijuana, Heroin, Methamphetamines, Other-see comments   Comment: prescription pain meds    Additional Social History:                           Allergies:  No Known Allergies Lab Results:  Results for orders placed or performed during the hospital encounter of 07/14/20 (from the past 48 hour(s))  Ethanol     Status: None   Collection Time: 07/14/20  3:55 PM  Result Value Ref Range   Alcohol, Ethyl (B) <10 <10 mg/dL    Comment: (NOTE) Lowest detectable limit for serum alcohol is 10 mg/dL.  For medical purposes only. Performed at [facility], [place] [address] [place], [place] [address]    Salicylate level     Status: Abnormal   Collection Time: 07/14/20  3:55 PM  Result Value Ref Range   Salicylate Lvl <2.7 (L) 7.0 - 30.0 mg/dL    Comment: Performed at [facility], [place] [address] [place], [place] [address]  Acetaminophen level     Status: Abnormal   Collection Time: 07/14/20  3:55 PM  Result Value Ref Range   Acetaminophen (Tylenol), Serum <10 (L) 10 - 30 ug/mL    Comment: (NOTE) Therapeutic concentrations vary significantly. A range of 10-30 ug/mL  may be an effective concentration for many patients. However, some  are best treated at concentrations outside of this range. Acetaminophen concentrations >150 ug/mL at 4 hours after ingestion  and >50 ug/mL at 12 hours after ingestion are often associated with  toxic reactions.  Performed at [facility], [place] [address] [place],  [address]   I-Stat beta hCG blood, ED     Status: None   Collection Time: 07/14/20  4:02 PM  Result Value Ref Range   I-stat hCG, quantitative <5.0 <5 mIU/mL   Comment 3            Comment:   GEST. AGE      CONC.  (mIU/mL)   <=1 WEEK        5 - 50     2 WEEKS       50 - 500     3 WEEKS       100 - 10,000     4 WEEKS     1,000 - 30,000        FEMALE AND NON-PREGNANT FEMALE:     LESS THAN 5 mIU/mL   Comprehensive metabolic panel     Status: Abnormal   Collection Time: 07/14/20  4:40 PM  Result Value Ref Range   Sodium 138 135 - 145 mmol/L   Potassium 3.2 (L) 3.5 - 5.1 mmol/L   Chloride 103 98 - 111 mmol/L   CO2 24 22 - 32 mmol/L   Glucose, Bld 108 (H) 70 - 99 mg/dL    Comment: Glucose reference range applies only to samples taken after fasting for at least 8 hours.   BUN 6 6 - 20 mg/dL   Creatinine, Ser 0.65 0.44 - 1.00 mg/dL   Calcium 9.1 8.9 - 10.3 mg/dL   Total Protein 7.6  6.5 - 8.1 g/dL   Albumin 4.3 3.5 - 5.0 g/dL   AST 35 15 - 41 U/L   ALT 28 0 - 44 U/L   Alkaline Phosphatase 59 38 - 126 U/L   Total Bilirubin 0.3 0.3 - 1.2  mg/dL   GFR calc non Af Amer >60 >60 mL/min   GFR calc Af Amer >60 >60 mL/min   Anion gap 11 5 - 15    Comment: Performed at [facility], [place] [address], [place], [place] [address]  CBC with Differential     Status: Abnormal   Collection Time: 07/14/20  4:40 PM  Result Value Ref Range   WBC 9.6 4.0 - 10.5 K/uL   RBC 5.05 3.87 - 5.11 MIL/uL   Hemoglobin 11.5 (L) 12.0 - 15.0 g/dL   HCT 37.7 36 - 46 %   MCV 74.7 (L) 80.0 - 100.0 fL   MCH 22.8 (L) 26.0 - 34.0 pg   MCHC 30.5 30.0 - 36.0 g/dL   RDW 22.1 (H) 11.5 - 15.5 %   Platelets 338 150 - 400 K/uL   nRBC 0.0 0.0 - 0.2 %   Neutrophils Relative % 60 %   Neutro Abs 5.8 1.7 - 7.7 K/uL   Lymphocytes Relative 25 %   Lymphs Abs 2.4 0.7 - 4.0 K/uL   Monocytes Relative 12 %   Monocytes Absolute 1.2 (H) 0 - 1 K/uL   Eosinophils Relative 2 %   Eosinophils Absolute 0.2 0 - 0 K/uL   Basophils Relative 1 %   Basophils Absolute 0.1 0 - 0 K/uL   RBC Morphology MORPHOLOGY UNREMARKABLE    Immature Granulocytes 0 %   Abs Immature Granulocytes 0.02 0.00 - 0.07 K/uL    Comment: Performed at [facility], [place] [address], [place], [place] [address]  Urine rapid drug screen (hosp performed)     Status: Abnormal   Collection Time: 07/14/20  4:40 PM  Result Value Ref Range   Opiates NONE DETECTED NONE DETECTED   Cocaine NONE DETECTED NONE DETECTED   Benzodiazepines NONE DETECTED NONE DETECTED   Amphetamines POSITIVE (A) NONE DETECTED   Tetrahydrocannabinol NONE DETECTED NONE DETECTED   Barbiturates NONE DETECTED NONE DETECTED    Comment: (NOTE) DRUG SCREEN FOR MEDICAL PURPOSES ONLY.  IF CONFIRMATION IS NEEDED FOR ANY PURPOSE, NOTIFY LAB WITHIN 5 DAYS.  LOWEST DETECTABLE LIMITS FOR URINE DRUG SCREEN Drug Class                     Cutoff (ng/mL) Amphetamine and metabolites    1000 Barbiturate and metabolites    200 Benzodiazepine                 742 Tricyclics and metabolites     300 Opiates and  metabolites        300 Cocaine and metabolites        300 THC                            50 Performed at [facility], [place] [address], [place], [place] [address]   Acetaminophen level     Status: Abnormal   Collection Time: 07/14/20  6:47 PM  Result Value Ref Range   Acetaminophen (Tylenol), Serum <10 (L) 10 - 30 ug/mL    Comment: (NOTE) Therapeutic concentrations vary significantly. A range of 10-30 ug/mL  may be an effective concentration for many  patients. However, some  are best treated at concentrations outside of this range. Acetaminophen concentrations >150 ug/mL at 4 hours after ingestion  and >50 ug/mL at 12 hours after ingestion are often associated with  toxic reactions.  Performed at [facility], [place] [address], [place], [place] [address]   SARS Coronavirus 2 by RT PCR (hospital order, performed in [facility] hospital lab) Nasopharyngeal Nasopharyngeal Swab     Status: None   Collection Time: 07/14/20  9:48 PM   Specimen: Nasopharyngeal Swab  Result Value Ref Range   SARS Coronavirus 2 NEGATIVE NEGATIVE    Comment: (NOTE) SARS-CoV-2 target nucleic acids are NOT DETECTED.  The SARS-CoV-2 RNA is generally detectable in upper and lower respiratory specimens during the acute phase of infection. The lowest concentration of SARS-CoV-2 viral copies this assay can detect is 250 copies / mL. A negative result does not preclude SARS-CoV-2 infection and should not be used as the sole basis for treatment or other patient management decisions.  A negative result may occur with improper specimen collection / handling, submission of specimen other than nasopharyngeal swab, presence of viral mutation(s) within the areas targeted by this assay, and inadequate number of viral copies (<250 copies / mL). A negative result must be combined with clinical observations, patient history, and epidemiological information.  Fact Sheet for Patients:    StrictlyIdeas.no  Fact Sheet for Healthcare Providers: BankingDealers.co.za  This test is not yet approved or  cleared by the [place] FDA and has been authorized for detection and/or diagnosis of SARS-CoV-2 by FDA under an Emergency Use Authorization (EUA).  This EUA will remain in effect (meaning this test can be used) for the duration of the COVID-19 declaration under Section 564(b)(1) of the Act, 21 U.S.C. section 360bbb-3(b)(1), unless the authorization is terminated or revoked sooner.  Performed at [facility], [place] [address] [place], [place] [address]   CBC with Differential     Status: Abnormal   Collection Time: 07/15/20  6:50 AM  Result Value Ref Range   WBC 9.8 4.0 - 10.5 K/uL   RBC 5.33 (H) 3.87 - 5.11 MIL/uL   Hemoglobin 12.2 12.0 - 15.0 g/dL   HCT 40.0 36 - 46 %   MCV 75.0 (L) 80.0 - 100.0 fL   MCH 22.9 (L) 26.0 - 34.0 pg   MCHC 30.5 30.0 - 36.0 g/dL   RDW 21.9 (H) 11.5 - 15.5 %   Platelets 343 150 - 400 K/uL   nRBC 0.0 0.0 - 0.2 %   Neutrophils Relative % 58 %   Neutro Abs 5.7 1.7 - 7.7 K/uL   Lymphocytes Relative 28 %   Lymphs Abs 2.7 0.7 - 4.0 K/uL   Monocytes Relative 11 %   Monocytes Absolute 1.1 (H) 0 - 1 K/uL   Eosinophils Relative 2 %   Eosinophils Absolute 0.2 0 - 0 K/uL   Basophils Relative 1 %   Basophils Absolute 0.1 0 - 0 K/uL   Immature Granulocytes 0 %   Abs Immature Granulocytes 0.02 0.00 - 0.07 K/uL    Comment: Performed at [facility], [place] [address], [place], [place] [address]  Comprehensive metabolic panel     Status: Abnormal   Collection Time: 07/15/20  6:50 AM  Result Value Ref Range   Sodium 138 135 - 145 mmol/L   Potassium 3.7 3.5 - 5.1 mmol/L   Chloride 104 98 - 111 mmol/L   CO2 24 22 - 32  mmol/L   Glucose, Bld 117 (H) 70 - 99 mg/dL    Comment: Glucose reference range applies only to samples taken after fasting for at least 8 hours.    BUN 7 6 - 20 mg/dL   Creatinine, Ser 0.61 0.44 - 1.00 mg/dL   Calcium 9.1 8.9 - 10.3 mg/dL   Total Protein 8.0 6.5 - 8.1 g/dL   Albumin 4.5 3.5 - 5.0 g/dL   AST 33 15 - 41 U/L   ALT 26 0 - 44 U/L   Alkaline Phosphatase 67 38 - 126 U/L   Total Bilirubin 0.3 0.3 - 1.2 mg/dL   GFR calc non Af Amer >60 >60 mL/min   GFR calc Af Amer >60 >60 mL/min   Anion gap 10 5 - 15    Comment: Performed at [facility], [place] [address] [place], [place] [address]  Magnesium     Status: None   Collection Time: 07/15/20  6:50 AM  Result Value Ref Range   Magnesium 2.1 1.7 - 2.4 mg/dL    Comment: Performed at [facility], [place] [address], [place], [place] [address]    Blood Alcohol level:  Lab Results  Component Value Date   ETH <10 07/14/2020   ETH 6 (H) 86/57/8469    Metabolic Disorder Labs:  Lab Results  Component Value Date   HGBA1C 6.1 (H) 03/23/2020   MPG 128.37 03/23/2020   MPG 117 (H) 09/19/2014   No results found for: PROLACTIN Lab Results  Component Value Date   CHOL 224 (H) 03/23/2020   TRIG 215 (H) 03/23/2020   HDL 63 03/23/2020   CHOLHDL 3.6 03/23/2020   VLDL 43 (H) 03/23/2020   LDLCALC 118 (H) 03/23/2020   LDLCALC 101 (H) 09/19/2014    Current Medications: Current Facility-Administered Medications  Medication Dose Route Frequency Provider Last Rate Last Admin   acetaminophen (TYLENOL) tablet 650 mg  650 mg Oral Q6H PRN Lindon Romp A, NP   650 mg at 07/15/20 2254   alum & mag hydroxide-simeth (MAALOX/MYLANTA) 200-200-20 MG/5ML suspension 30 mL  30 mL Oral Q4H PRN Lindon Romp A, NP       amLODipine (NORVASC) tablet 10 mg  10 mg Oral Daily Lindon Romp A, NP   10 mg at 07/16/20 6295   ferrous sulfate tablet 325 mg  325 mg Oral Q breakfast Lindon Romp A, NP   325 mg at 07/16/20 2841   hydrOXYzine (ATARAX/VISTARIL) tablet 25 mg  25 mg Oral TID PRN Rozetta Nunnery, NP   25 mg at 07/15/20 2254   magnesium hydroxide (MILK OF  MAGNESIA) suspension 30 mL  30 mL Oral Daily PRN Lindon Romp A, NP       metFORMIN (GLUCOPHAGE) tablet 500 mg  500 mg Oral Daily Lindon Romp A, NP       nicotine (NICODERM CQ - dosed in mg/24 hours) patch 21 mg  21 mg Transdermal Daily Lindon Romp A, NP   21 mg at 07/16/20 0853   OLANZapine (ZYPREXA) tablet 5 mg  5 mg Oral QHS Lindon Romp A, NP   5 mg at 07/15/20 2254   pantoprazole (PROTONIX) EC tablet 40 mg  40 mg Oral Daily Lindon Romp A, NP   40 mg at 07/16/20 3244   senna-docusate (Senokot-S) tablet 1 tablet  1 tablet Oral Daily PRN Rozetta Nunnery, NP       traZODone (DESYREL) tablet 50 mg  50 mg Oral QHS PRN Lindon Romp  A, NP   50 mg at 07/15/20 2254   venlafaxine XR (EFFEXOR-XR) 24 hr capsule 150 mg  150 mg Oral Q breakfast Lindon Romp A, NP   150 mg at 07/16/20 0350   PTA Medications: Medications Prior to Admission  Medication Sig Dispense Refill Last Dose   amLODipine (NORVASC) 10 MG tablet Take 1 tablet (10 mg total) by mouth daily. 30 tablet 1    ferrous sulfate 325 (65 FE) MG tablet Take 1 tablet (325 mg total) by mouth daily with breakfast. 100 tablet 1    gabapentin (NEURONTIN) 300 MG capsule Take 300 mg by mouth 3 (three) times daily.      LORazepam (ATIVAN) 0.5 MG tablet Take 0.5 mg by mouth every 8 (eight) hours as needed for anxiety.      mirtazapine (REMERON) 30 MG tablet Take 30 mg by mouth at bedtime.      OLANZapine (ZYPREXA) 5 MG tablet Take 1 tablet (5 mg total) by mouth at bedtime. 30 tablet 1    pantoprazole (PROTONIX) 40 MG tablet Take 1 tablet (40 mg total) by mouth daily. 30 tablet 1    venlafaxine XR (EFFEXOR-XR) 150 MG 24 hr capsule Take 1 capsule (150 mg total) by mouth daily with breakfast. 30 capsule 1    ciprofloxacin (CIPRO) 500 MG tablet Take 1 tablet (500 mg total) by mouth 2 (two) times daily. 14 tablet 0    metFORMIN (GLUCOPHAGE) 500 MG tablet Take 1 tablet (500 mg total) by mouth daily. For prediabetes 30 tablet 1     senna-docusate (SENOKOT-S) 8.6-50 MG tablet Take 1 tablet by mouth daily as needed for mild constipation. 30 tablet 1     Musculoskeletal: Strength & Muscle Tone: within normal limits Gait & Station: shuffle Patient leans: N/A  Psychiatric Specialty Exam: Physical Exam Vitals and nursing note reviewed.  HENT:     Head: Normocephalic and atraumatic.  Pulmonary:     Effort: Pulmonary effort is normal.  Neurological:     General: No focal deficit present.     Mental Status: She is alert and oriented to person, place, and time.     Review of Systems  Blood pressure 126/79, pulse 71, temperature 97.7 F (36.5 C), temperature source Oral, resp. rate 16, height 5\' 1"  (1.549 m), weight 76.2 kg, SpO2 99 %.Body mass index is 31.74 kg/m.  General Appearance: Disheveled  Eye Contact:  Fair  Speech:  Normal Rate  Volume:  Decreased  Mood:  Dysphoric  Affect:  Congruent  Thought Process:  Goal Directed and Descriptions of Associations: Circumstantial  Orientation:  Full (Time, Place, and Person)  Thought Content:  Logical  Suicidal Thoughts:  No  Homicidal Thoughts:  No  Memory:  Immediate;   Poor Recent;   Poor Remote;   Poor  Judgement:  Impaired  Insight:  Fair  Psychomotor Activity:  Decreased  Concentration:  Concentration: Fair and Attention Span: Fair  Recall:  [employer] of Knowledge:  Fair  Language:  Fair  Akathisia:  Negative  Handed:  Right  AIMS (if indicated):     Assets:  Desire for Improvement Resilience  ADL's:  Impaired  Cognition:  WNL  Sleep:  Number of Hours: 3    Treatment Plan Summary: Daily contact with patient to assess and evaluate symptoms and progress in treatment, Medication management and Plan : Patient is seen and examined.  Patient is a 44 year old female with the above-stated past psychiatric history who is admitted with relapse on  substances as well as suicidal ideation.  She will be admitted to the hospital.  She will be integrated in the  milieu.  She will be encouraged to attend groups.  The plan at then to continue her medications as previously discharged on, but when she was admitted last night cross cover placed her on venlafaxine extended release as well as Zyprexa.  We will reassess the situation this a.m. and decide what to do with her medications.  Review of her admission laboratories revealed essentially normal electrolytes including liver function enzymes.  Her MCV was decreased at 75, MCH done at 22.9.  She be placed on folic acid as well as thiamine.  Blood alcohol was less than 10, acetaminophen was less than 10, salicylate less than 7.  Drug screen was positive for amphetamines.  Beta-hCG was negative.  EKG shows sinus rhythm with a normal QTc interval.  This a.m. her blood pressure is mildly elevated at 136/108.  Pulse is 94.  She is afebrile.  Observation Level/Precautions:  Detox 15 minute checks  Laboratory:  Chemistry Profile  Psychotherapy:    Medications:    Consultations:    Discharge Concerns:    Estimated LOS:  Other:     Physician Treatment Plan for Primary Diagnosis: <principal problem not specified> Long Term Goal(s): Improvement in symptoms so as ready for discharge  Short Term Goals: Ability to identify changes in lifestyle to reduce recurrence of condition will improve, Ability to verbalize feelings will improve, Ability to disclose and discuss suicidal ideas, Ability to demonstrate self-control will improve, Ability to identify and develop effective coping behaviors will improve, Ability to maintain clinical measurements within normal limits will improve and Ability to identify triggers associated with substance abuse/mental health issues will improve  Physician Treatment Plan for Secondary Diagnosis: Active Problems:   Substance induced mood disorder ([place])  Long Term Goal(s): Improvement in symptoms so as ready for discharge  Short Term Goals: Ability to identify changes in lifestyle to reduce  recurrence of condition will improve, Ability to verbalize feelings will improve, Ability to disclose and discuss suicidal ideas, Ability to demonstrate self-control will improve, Ability to identify and develop effective coping behaviors will improve, Ability to maintain clinical measurements within normal limits will improve and Ability to identify triggers associated with substance abuse/mental health issues will improve  I certify that inpatient services furnished can reasonably be expected to improve the patient's condition.    Sharma Covert, MD 8/24/202111:27 AM

## 2020-07-16 NOTE — Progress Notes (Signed)
   07/16/20 2125  COVID-19 Daily Checkoff  Have you had a fever (temp > 37.80C/100F)  in the past 24 hours?  No  COVID-19 EXPOSURE  Have you traveled outside the state in the past 14 days? No  Have you been in contact with someone with a confirmed diagnosis of COVID-19 or PUI in the past 14 days without wearing appropriate PPE? No  Have you been living in the same home as a person with confirmed diagnosis of COVID-19 or a PUI (household contact)? No  Have you been diagnosed with COVID-19? No

## 2020-07-16 NOTE — [facility] Suicide Risk Assessment (Signed)
[facility] Admission Suicide Risk Assessment   Nursing information obtained from:  Patient Demographic factors:  Low socioeconomic status, Living alone Current Mental Status:  NA Loss Factors:  Legal issues, Loss of significant relationship Historical Factors:  Family history of mental illness or substance abuse Risk Reduction Factors:  NA  Total Time spent with patient: 30 minutes Principal Problem: <principal problem not specified> Diagnosis:  Active Problems:   Substance induced mood disorder (HCC)  Subjective Data: Patient is seen and examined.  Patient is a 44 year old female with a past psychiatric history significant for polysubstance dependence as well as depression.  The patient presented to the [facility] emergency department on 07/15/2020 with suicidal ideation.  She stated her plan was to run a car into a tree, or put a gun in her mouth.  She had just been discharged from [facility] 2 days prior to admission.  She had been placed on gabapentin, lorazepam, mirtazapine as well as Protonix.  She stated that she had relapsed on substances as soon as she had left that facility.  She had apparently overused her gabapentin.  She was seen by me and staff in the emergency department at [facility] on 07/15/2020 and the patient continued to be suicidal.  Attempts to communicate with [facility] for sign to have the patient transferred back to their facility were not successful and she was admitted here to the hospital.  With regard to her substance use issues her drug screen was positive for amphetamines, but otherwise negative.  Blood alcohol was less than 10.  Continued Clinical Symptoms:  Alcohol Use Disorder Identification Test Final Score (AUDIT): 23 The "Alcohol Use Disorders Identification Test", Guidelines for Use in Primary Care, Second Edition.  World Pharmacologist [facility]). Score between 0-7:  no or low risk or alcohol related problems. Score  between 8-15:  moderate risk of alcohol related problems. Score between 16-19:  high risk of alcohol related problems. Score 20 or above:  warrants further diagnostic evaluation for alcohol dependence and treatment.   CLINICAL FACTORS:   Depression:   Anhedonia Comorbid alcohol abuse/dependence Hopelessness Impulsivity Insomnia Alcohol/Substance Abuse/Dependencies   Musculoskeletal: Strength & Muscle Tone: within normal limits Gait & Station: normal Patient leans: N/A  Psychiatric Specialty Exam: Physical Exam Vitals and nursing note reviewed.  HENT:     Head: Normocephalic and atraumatic.  Pulmonary:     Effort: Pulmonary effort is normal.  Neurological:     General: No focal deficit present.     Mental Status: She is alert and oriented to person, place, and time.     Review of Systems  Blood pressure (!) 136/108, pulse 94, temperature 97.7 F (36.5 C), temperature source Oral, resp. rate 16, height 5\' 1"  (1.549 m), weight 76.2 kg, SpO2 99 %.Body mass index is 31.74 kg/m.  General Appearance: Disheveled  Eye Contact:  Fair  Speech:  Normal Rate  Volume:  Decreased  Mood:  Depressed  Affect:  Congruent  Thought Process:  Coherent and Descriptions of Associations: Circumstantial  Orientation:  Full (Time, Place, and Person)  Thought Content:  Rumination  Suicidal Thoughts:  Yes.  without intent/plan  Homicidal Thoughts:  No  Memory:  Immediate;   Fair Recent;   Fair Remote;   Fair  Judgement:  Poor  Insight:  Lacking  Psychomotor Activity:  Increased  Concentration:  Concentration: Fair and Attention Span: Fair  Recall:  [employer] of Knowledge:  Fair  Language:  Kermit Balo  Akathisia:  Negative  Handed:  Right  AIMS (if indicated):     Assets:  Desire for Improvement Resilience  ADL's:  Intact  Cognition:  WNL  Sleep:  Number of Hours: 3      COGNITIVE FEATURES THAT CONTRIBUTE TO RISK:  None    SUICIDE RISK:   Mild:  Suicidal ideation of limited  frequency, intensity, duration, and specificity.  There are no identifiable plans, no associated intent, mild dysphoria and related symptoms, good self-control (both objective and subjective assessment), few other risk factors, and identifiable protective factors, including available and accessible social support.  PLAN OF CARE: Patient is seen and examined.  Patient is a 44 year old female with the above-stated past psychiatric history who is admitted with relapse on substances as well as suicidal ideation.  She will be admitted to the hospital.  She will be integrated in the milieu.  She will be encouraged to attend groups.  The plan at then to continue her medications as previously discharged on, but when she was admitted last night cross cover placed her on venlafaxine extended release as well as Zyprexa.  We will reassess the situation this a.m. and decide what to do with her medications.  Review of her admission laboratories revealed essentially normal electrolytes including liver function enzymes.  Her MCV was decreased at 75, MCH done at 22.9.  She be placed on folic acid as well as thiamine.  Blood alcohol was less than 10, acetaminophen was less than 10, salicylate less than 7.  Drug screen was positive for amphetamines.  Beta-hCG was negative.  EKG shows sinus rhythm with a normal QTc interval.  This a.m. her blood pressure is mildly elevated at 136/108.  Pulse is 94.  She is afebrile.  I certify that inpatient services furnished can reasonably be expected to improve the patient's condition.   Sharma Covert, MD 07/16/2020, 6:16 AM

## 2020-07-16 NOTE — Progress Notes (Signed)
Recreation Therapy Notes  Animal-Assisted Activity (AAA) Program Checklist/Progress Notes Patient Eligibility Criteria Checklist & Daily Group note for Rec Tx Intervention  Date: 8.24.21 Time: 76 Location: 300 Hall Group Room   AAA/T Program Assumption of Risk Form signed by Patient/ or Parent Legal Guardian  YES  Patient is free of allergies or sever asthma  YES   Patient reports no fear of animals YES   Patient reports no history of cruelty to animals  YES  Patient understands his/her participation is voluntary YES  Patient washes hands before animal contact  YES   Patient washes hands after animal contact  YES   Education: Contractor, Appropriate Animal Interaction   Education Outcome: Acknowledges understanding/In group clarification offered/Needs additional education.   Clinical Observations/Feedback:  Pt did not attend.    Victorino Sparrow, LRT/CTRS         Victorino Sparrow A 07/16/2020 3:36 PM

## 2020-07-16 NOTE — Progress Notes (Signed)
Pt denied SI/HI/AVH.  Endorses anxiety and depression.  Pt is irritable at times when interacting with RN.  RN attempted to establish rapport with pt and assess for needs and concerns.  Pt took her medications as prescribed, but she would not take Metformin saying "I don't take Metformin."  RN will continue to establish rapport with pt and provide support and encouragement. Q 15 min safety checks remain in place.

## 2020-07-17 DIAGNOSIS — F1994 Other psychoactive substance use, unspecified with psychoactive substance-induced mood disorder: Secondary | ICD-10-CM | POA: Diagnosis not present

## 2020-07-17 LAB — LIPID PANEL
Cholesterol: 192 mg/dL (ref 0–200)
HDL: 49 mg/dL (ref >40)
LDL Cholesterol: 98 mg/dL (ref 0–99)
Total CHOL/HDL Ratio: 3.9 RATIO
Triglycerides: 224 mg/dL — ABNORMAL HIGH (ref <150)
VLDL: 45 mg/dL — ABNORMAL HIGH (ref 0–40)

## 2020-07-17 LAB — GLUCOSE, CAPILLARY: Glucose-Capillary: 98 mg/dL (ref 70–99)

## 2020-07-17 LAB — HEMOGLOBIN A1C
Hgb A1c MFr Bld: 5.4 % (ref 4.8–5.6)
Mean Plasma Glucose: 108.28 mg/dL

## 2020-07-17 LAB — TSH: TSH: 0.997 u[IU]/mL (ref 0.350–4.500)

## 2020-07-17 MED ORDER — MIRTAZAPINE 30 MG PO TABS
30.0000 mg | ORAL_TABLET | Freq: Every day | ORAL | Status: DC
  Administered 2020-07-17 – 2020-07-22 (×6): 30 mg via ORAL
  Filled 2020-07-17 (×8): qty 1
  Filled 2020-07-17: qty 7

## 2020-07-17 MED ORDER — VENLAFAXINE HCL ER 150 MG PO CP24
300.0000 mg | ORAL_CAPSULE | Freq: Every day | ORAL | Status: DC
  Administered 2020-07-18 – 2020-07-23 (×6): 300 mg via ORAL
  Filled 2020-07-17 (×4): qty 2
  Filled 2020-07-17: qty 14
  Filled 2020-07-17 (×3): qty 2

## 2020-07-17 MED ORDER — CLONIDINE HCL 0.1 MG PO TABS
0.1000 mg | ORAL_TABLET | Freq: Once | ORAL | Status: AC
  Administered 2020-07-17: 0.1 mg via ORAL
  Filled 2020-07-17 (×2): qty 1

## 2020-07-17 MED ORDER — THIAMINE HCL 100 MG PO TABS
100.0000 mg | ORAL_TABLET | Freq: Every day | ORAL | Status: DC
  Administered 2020-07-17 – 2020-07-23 (×7): 100 mg via ORAL
  Filled 2020-07-17 (×7): qty 1
  Filled 2020-07-17: qty 7
  Filled 2020-07-17 (×2): qty 1

## 2020-07-17 MED ORDER — FOLIC ACID 1 MG PO TABS
1.0000 mg | ORAL_TABLET | Freq: Every day | ORAL | Status: DC
  Administered 2020-07-17 – 2020-07-22 (×6): 1 mg via ORAL
  Filled 2020-07-17 (×8): qty 1
  Filled 2020-07-17: qty 7
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
## 2020-07-17 NOTE — Progress Notes (Signed)
   07/17/20 2015  COVID-19 Daily Checkoff  Have you had a fever (temp > 37.80C/100F)  in the past 24 hours?  No  COVID-19 EXPOSURE  Have you traveled outside the state in the past 14 days? No  Have you been in contact with someone with a confirmed diagnosis of COVID-19 or PUI in the past 14 days without wearing appropriate PPE? No  Have you been living in the same home as a person with confirmed diagnosis of COVID-19 or a PUI (household contact)? No  Have you been diagnosed with COVID-19? No

## 2020-07-17 NOTE — Progress Notes (Signed)
Pt was greeted at the beginning of the shift in her room. Pt was resting in bed and pt was asked about how she was feeling. Pt said that she wasn't feeling well, but wasn't able to pinpoint what was wrong. Pt denied any nausea, vomiting, diarrhea, dizziness, lightheadedness, headache, or pain. Pt said she no longer had a headache like she did earlier during the day. Pt said her goal is to "not feel how I've been feeling." Pt has been depressed, minimal, and isolative to her room. Pt's vital signs were assessed. At 2025, blood pressure was 149/106 with a pulse of 73. Pt was administered her scheduled bedtime medications along with her PRN vistaril and trazadone. Pt was provided ice and gatorade upon her request. At this time, pt appeared more pleasant and slightly brighter. BP rechecked at 2242, it was 156/109 with a pulse of 72. NP, Lindon Romp notified and ordered one time dose of 0.1 mg of clonidine. Pt has been passively suicidal without a plan while she's here and verbally contracts for safety. Pt said that if she was discharged, she would "step out in front of a car." Pt denies HI and AVH. Pt encouraged to be more active in the milieu and attend groups. Active listening, reassurance, and support provided. Q 15 min safety checks continue. Pt's safety has been maintained.     07/17/20 2015  Psych Admission Type (Psych Patients Only)  Admission Status Involuntary  Psychosocial Assessment  Patient Complaints Anxiety;Depression;Sadness  Eye Contact Avoids;Brief  Facial Expression Sad;Sullen;Pained;Worried  Affect Depressed;Anxious;Sad;Sullen  Speech Logical/coherent  Interaction Minimal;Forwards little;Isolative  Motor Activity Slow  Appearance/Hygiene Unremarkable  Behavior Characteristics Cooperative;Anxious;Calm;Guarded  Mood Depressed;Anxious;Sad  Thought Process  Coherency WDL  Content WDL  Delusions None reported or observed  Perception WDL  Hallucination None reported or observed   Judgment Impaired  Confusion None  Danger to Self  Current suicidal ideation? Passive  Self-Injurious Behavior Some self-injurious ideation observed or expressed.  No lethal plan expressed   Agreement Not to Harm Self Yes  Description of Agreement verbally contracts for safety  Danger to Others  Danger to Others None reported or observed
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
## 2020-07-17 NOTE — Progress Notes (Signed)
Psychoeducational Group Note  Date:  07/17/2020 Time:  2100  Group Topic/Focus:  Wrap-Up Group:   The focus of this group is to help patients review their daily goal of treatment and discuss progress on daily workbooks.  Participation Level: Did Not Attend  Participation Quality:  Not Applicable  Affect:  Not Applicable  Cognitive:  Not Applicable  Insight:  Not Applicable  Engagement in Group: Not Applicable  Additional Comments:  The patient did not attend group this evening.   Roland Prine S 07/17/2020, 9:00 PM

## 2020-07-17 NOTE — BHH Counselor (Signed)
CSW received [facility] auth for 07/17/20 - 07/23/20. Authorization number is [id number]. Referral faxed to [facility].   CSW also spoke with admissions at [facility]. [facility] expressed concerns that pt may be too acute with mental health for their program. They will email CSW referral form.

## 2020-07-17 NOTE — BHH Counselor (Addendum)
CSW faxed referrals to [facility], [facility], [facility]  Informed by [facility] that no female beds until mid next week.   Ms June from [facility] called and informed CSW that pt was in tx  There within past 30 days and left early. Pt would not be able to return for 90 days.

## 2020-07-18 DIAGNOSIS — F332 Major depressive disorder, recurrent severe without psychotic features: Principal | ICD-10-CM

## 2020-07-18 DIAGNOSIS — F1994 Other psychoactive substance use, unspecified with psychoactive substance-induced mood disorder: Secondary | ICD-10-CM | POA: Diagnosis not present

## 2020-07-18 LAB — GLUCOSE, CAPILLARY: Glucose-Capillary: 104 mg/dL — ABNORMAL HIGH (ref 70–99)

## 2020-07-18 NOTE — Progress Notes (Signed)
D:  Patient denied HI.  Patient stated she does have SI thoughts, no plan, contracts for safety.  Denied A/V hallucinations.   A:  Medications administered per MD orders.  Emotional support and encouragement given patient. R:  Safety maintained with 15 minute checks.
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
## 2020-07-18 NOTE — [facility] Counselor (Addendum)
Pt is approved for [facility] but they do not currently have a bed. Inform CSW to call back tomorrow and ask for [facility] and informed that pt is on wait list and for CSW to call following week.

## 2020-07-18 NOTE — Progress Notes (Signed)
The patient shared with the group that she spent less time in her bedroom today. Her goal for tomorrow is to spend even more time with her peers .

## 2020-07-19 DIAGNOSIS — F1994 Other psychoactive substance use, unspecified with psychoactive substance-induced mood disorder: Secondary | ICD-10-CM | POA: Diagnosis not present

## 2020-07-19 MED ORDER — HYDROCHLOROTHIAZIDE 25 MG PO TABS
25.0000 mg | ORAL_TABLET | Freq: Every day | ORAL | Status: DC
  Administered 2020-07-19 – 2020-07-23 (×5): 25 mg via ORAL
  Filled 2020-07-19 (×8): qty 1

## 2020-07-19 MED ORDER — CLONIDINE HCL 0.1 MG PO TABS
0.1000 mg | ORAL_TABLET | Freq: Three times a day (TID) | ORAL | Status: DC | PRN
  Administered 2020-07-19 – 2020-07-21 (×3): 0.1 mg via ORAL
  Filled 2020-07-19 (×4): qty 1

## 2020-07-19 MED ORDER — GABAPENTIN 300 MG PO CAPS
300.0000 mg | ORAL_CAPSULE | Freq: Three times a day (TID) | ORAL | Status: DC
  Administered 2020-07-19 – 2020-07-23 (×11): 300 mg via ORAL
  Filled 2020-07-19 (×3): qty 1
  Filled 2020-07-19: qty 21
  Filled 2020-07-19 (×12): qty 1
  Filled 2020-07-19 (×2): qty 21
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
## 2020-07-19 NOTE — Progress Notes (Signed)
Pt blood pressure remains elevated this shift. Last vitals was 155/110 with c/o back pain "My back hurts, it maybe my kidneys. My blood pressure at home was like perfect 120/80 and my blood pressure been high ever since I've been here". Reddish spots noted on bilateral legs "You see, it's like I'm breaking out". Oncoming shift made aware. Emotional support offered. Safety maintained. Will notified on call provider.

## 2020-07-19 NOTE — Progress Notes (Signed)
Patient did not attend Goal setting and Emotion Regulation group.

## 2020-07-19 NOTE — Progress Notes (Signed)
Cooperative with treatment , medication compliant, no issues to report on shift at this time. Patient appears to be in bed resting quietly.

## 2020-07-19 NOTE — Progress Notes (Signed)
Recreation Therapy Notes  Date:  8.27.21 Time: 0930 Location: [address] Dayroom  Group Topic: Stress Management  Goal Area(s) Addresses:  Patient will identify positive stress management techniques. Patient will identify benefits of using stress management post d/c.  Intervention: Stress Management  Activity : Meditation.  LRT played a meditation that focused on letting go of the past and not letting it affect the present.  Patients were to listen and follow along as meditation played to engage in activity.    Education:  Stress Management, Discharge Planning.   Education Outcome: Acknowledges Education  Clinical Observations/Feedback: Pt did not attend group activity.    Victorino Sparrow, LRT/CTRS         Ria Comment, Cylah Fannin A 07/19/2020 10:12 AM

## 2020-07-20 DIAGNOSIS — F332 Major depressive disorder, recurrent severe without psychotic features: Secondary | ICD-10-CM | POA: Diagnosis not present

## 2020-07-20 DIAGNOSIS — F1994 Other psychoactive substance use, unspecified with psychoactive substance-induced mood disorder: Secondary | ICD-10-CM | POA: Diagnosis not present

## 2020-07-20 MED ORDER — LISINOPRIL 20 MG PO TABS
20.0000 mg | ORAL_TABLET | Freq: Every day | ORAL | Status: DC
  Administered 2020-07-20 – 2020-07-23 (×4): 20 mg via ORAL
  Filled 2020-07-20 (×6): qty 1
  Filled 2020-07-20: qty 7

## 2020-07-20 MED ORDER — OLANZAPINE 7.5 MG PO TABS
7.5000 mg | ORAL_TABLET | Freq: Every day | ORAL | Status: DC
  Administered 2020-07-20 – 2020-07-22 (×3): 7.5 mg via ORAL
  Filled 2020-07-20 (×2): qty 1
  Filled 2020-07-20: qty 7
  Filled 2020-07-20 (×2): qty 1

## 2020-07-20 MED ORDER — PRAZOSIN HCL 1 MG PO CAPS
1.0000 mg | ORAL_CAPSULE | Freq: Every day | ORAL | Status: DC
  Administered 2020-07-20 – 2020-07-22 (×3): 1 mg via ORAL
  Filled 2020-07-20 (×2): qty 1
  Filled 2020-07-20: qty 7
  Filled 2020-07-20 (×2): qty 1

## 2020-07-20 MED ORDER — TRAZODONE HCL 50 MG PO TABS
50.0000 mg | ORAL_TABLET | Freq: Once | ORAL | Status: AC
  Filled 2020-07-20 (×2): qty 1

## 2020-07-20 NOTE — [facility] Group Notes (Signed)
[facility] Group Notes: (Clinical Social Work)   07/20/2020      Type of Therapy:  Group Therapy   Participation Level:  Did Not Attend despite MHT prompting   Tye Savoy, LCSW  07/20/2020 1:19 PM

## 2020-07-20 NOTE — Progress Notes (Addendum)
Pt rates depression 6/10, hopelessness 4/10, anxiety 8/10. She denies SI/HI. Pt reports a fair appetite today. She reports difficulty sleeping due to having nightmare and racing thoughts. She reports withdrawal symptoms of tremors, cramping, agitation, and irritability. She is noncompliant with attending groups and has refused to attend groups when encouraged by staff. Pt refused to take metformin when offered, stating she's not diabetic.   Orders reviewed. V/s reviewed and monitored. DBP elevated this afternoon and the pt was administered Clonidine 0.1 mg prn, per Herbert Pun, NP. Verbal support provided. Pt encouraged to attend groups. 15 minute checks performed for safety.   Pt compliant with taking medications and denies any medication side effects.

## 2020-07-20 NOTE — [facility] Group Notes (Signed)
Adult Psychoeducational Group Note  Date:  07/20/2020 Time:  12:37 PM  Group Topic/Focus:  Goals Group:   The focus of this group is to help patients establish daily goals to achieve during treatment and discuss how the patient can incorporate goal setting into their daily lives to aide in recovery.  Participation Level:  Did Not Attend   Paulino Rily 07/20/2020, 12:37 PM
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
## 2020-07-20 NOTE — Progress Notes (Signed)
   07/20/20 0032  Psych Admission Type (Psych Patients Only)  Admission Status Involuntary  Psychosocial Assessment  Patient Complaints Anxiety;Other (Comment) (Blood pressure issues)  Eye Contact Fair  Facial Expression Flat  Affect Depressed  Speech Logical/coherent  Interaction Assertive  Appearance/Hygiene Unremarkable  Behavior Characteristics Cooperative  Mood Depressed;Anxious  Thought Process  Coherency WDL  Content WDL  Delusions WDL  Perception WDL  Hallucination None reported or observed  Judgment WDL  Confusion WDL  Danger to Self  Current suicidal ideation? Denies  Danger to Others  Danger to Others None reported or observed  Patient anxious on approach. Reports she feels her kidneys are hurting and generalized pain. Feels that her increased BP is the cause. Patient having symptoms of a panic attack. Patient extremely anxious and demanding something be done about her blood pressure. BP was 160/116. Physician called and guest was given some clonidine 0.1mg  at 2015 last night. Talked with patient and explained that sometimes it talks time to stabilize blood pressure. Patient went to half of group but wanted to lay down early. Patient medication focused.

## 2020-07-20 NOTE — Progress Notes (Signed)
  Refused CBG this am. Reports that she is only borderline diabetic

## 2020-07-20 NOTE — Progress Notes (Signed)
[facility] Group Notes:  (Nursing/MHT/Case Management/Adjunct)  Date:  07/20/2020  Time:  2030  Type of Therapy:  wrap up group  Participation Level:  Active  Participation Quality:  Attentive and Sharing  Affect:  Anxious and Depressed  Cognitive:  Alert  Insight:  Lacking  Engagement in Group:  Limited  Modes of Intervention:  Clarification, Education and Support  Summary of Progress/Problems: Positive thinking and positive change were discussed.   Winfield Rast S 07/20/2020, 9:20 PM

## 2020-07-21 DIAGNOSIS — F1994 Other psychoactive substance use, unspecified with psychoactive substance-induced mood disorder: Secondary | ICD-10-CM | POA: Diagnosis not present

## 2020-07-21 DIAGNOSIS — F332 Major depressive disorder, recurrent severe without psychotic features: Secondary | ICD-10-CM | POA: Diagnosis not present

## 2020-07-21 LAB — BASIC METABOLIC PANEL
Anion gap: 9 (ref 5–15)
BUN: 12 mg/dL (ref 6–20)
CO2: 24 mmol/L (ref 22–32)
Calcium: 9.2 mg/dL (ref 8.9–10.3)
Chloride: 101 mmol/L (ref 98–111)
Creatinine, Ser: 0.65 mg/dL (ref 0.44–1.00)
GFR calc Af Amer: >60 mL/min (ref >60)
GFR calc non Af Amer: >60 mL/min (ref >60)
Glucose, Bld: 126 mg/dL — ABNORMAL HIGH (ref 70–99)
Potassium: 3.8 mmol/L (ref 3.5–5.1)
Sodium: 134 mmol/L — ABNORMAL LOW (ref 135–145)

## 2020-07-21 MED ORDER — HYDROXYZINE HCL 50 MG PO TABS
50.0000 mg | ORAL_TABLET | Freq: Once | ORAL | Status: AC
  Administered 2020-07-21: 50 mg via ORAL
  Filled 2020-07-21 (×2): qty 1

## 2020-07-21 MED ORDER — TRAZODONE HCL 100 MG PO TABS
100.0000 mg | ORAL_TABLET | Freq: Every evening | ORAL | Status: DC | PRN
  Administered 2020-07-21 – 2020-07-22 (×2): 100 mg via ORAL
  Filled 2020-07-21 (×2): qty 1

## 2020-07-21 MED ORDER — VALACYCLOVIR HCL 500 MG PO TABS
500.0000 mg | ORAL_TABLET | Freq: Every day | ORAL | Status: DC
  Administered 2020-07-21 – 2020-07-23 (×3): 500 mg via ORAL
  Filled 2020-07-21 (×5): qty 1
  Filled 2020-07-21: qty 5

## 2020-07-21 NOTE — Progress Notes (Signed)
   07/21/20 2148  COVID-19 Daily Checkoff  Have you had a fever (temp > 37.80C/100F)  in the past 24 hours?  No  If you have had runny nose, nasal congestion, sneezing in the past 24 hours, has it worsened? No  COVID-19 EXPOSURE  Have you traveled outside the state in the past 14 days? No  Have you been in contact with someone with a confirmed diagnosis of COVID-19 or PUI in the past 14 days without wearing appropriate PPE? No  Have you been living in the same home as a person with confirmed diagnosis of COVID-19 or a PUI (household contact)? No  Have you been diagnosed with COVID-19? No

## 2020-07-21 NOTE — [facility] Group Notes (Signed)
[place] LCSW Group Therapy Note  Date/Time:  07/21/20  10:00AM-11:00AM  Type of Therapy and Topic:  Group Therapy:  Music and Mood  Participation Level: none  Description of Group: In this process group, members listened to a variety of genres of music and identified that different types of music evoke different responses.  Patients were encouraged to identify music that was soothing for them and music that was energizing for them.  Patients discussed how this knowledge can help with wellness and recovery in various ways including managing depression and anxiety as well as encouraging healthy sleep habits.    Therapeutic Goals: 1. Patients will explore the impact of different varieties of music on mood 2. Patients will verbalize the thoughts they have when listening to different types of music 3. Patients will identify music that is soothing to them as well as music that is energizing to them 4. Patients will discuss how to use this knowledge to assist in maintaining wellness and recovery 5. Patients will explore the use of music as a coping skill with an emphasis on it being a relaxation skill 6. Patient were given a handout with two additional coping strategies: challenging negative thinking and imagery 7. Patients were given a log and challenged to practice a relaxation skill 4-5 times a week  Summary of Patient Progress:  Patient came into group room with the last 15 minutes left. Patient did not engage in any discussion just sat in the corner writing in her journal.   Therapeutic Modalities: Solution Focused Brief Therapy Activity  Rise Mu, LCSW

## 2020-07-21 NOTE — Progress Notes (Signed)
   07/21/20 0036  Psych Admission Type (Psych Patients Only)  Admission Status Involuntary  Psychosocial Assessment  Patient Complaints Anxiety  Eye Contact Fair  Facial Expression Flat  Affect Appropriate to circumstance  Speech Logical/coherent  Interaction Assertive  Appearance/Hygiene Unremarkable  Behavior Characteristics Appropriate to situation  Mood Anxious;Pleasant  Thought Process  Coherency WDL  Content WDL  Delusions None reported or observed  Perception WDL  Hallucination None reported or observed  Judgment WDL  Confusion None  Danger to Self  Current suicidal ideation? Denies  Self-Injurious Behavior No self-injurious ideation or behavior indicators observed or expressed   Agreement Not to Harm Self Yes  Description of Agreement verbally contracts for safety  Danger to Others  Danger to Others None reported or observed

## 2020-07-21 NOTE — Progress Notes (Signed)
Pt rates depression 5/10, anxiety 10/10, and hopelessness 5/10. Pt denies SI/HI. She reports having a fair appetite. She reports difficulty staying asleep last night and requested to have Trazone increased at bedtime. She complained of increased anxiety this afternoon and requested Ativan. She was administered Vistaril 50 mg PO once. She was encouraged to stay in group and participate but refused. She was encouraged to use coping skills for anxiety, but refused, and blamed staff for not understanding. She c/o of a mouth sore and requested Valtrex. 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 Writer notify Herbert Pun, Np., and Valtrex was ordered per pt request.  Orders reviewed. Vital sign assessed. Verbal support provided. Pt encouraged to attend groups. 15 minute checks performed for safety.   Pt compliant with taking meds and denies having any medication side effects.

## 2020-07-21 NOTE — Progress Notes (Signed)
   07/21/20 0028  COVID-19 Daily Checkoff  Have you had a fever (temp > 37.80C/100F)  in the past 24 hours?  No  If you have had runny nose, nasal congestion, sneezing in the past 24 hours, has it worsened? No  COVID-19 EXPOSURE  Have you traveled outside the state in the past 14 days? No  Have you been in contact with someone with a confirmed diagnosis of COVID-19 or PUI in the past 14 days without wearing appropriate PPE? No  Have you been living in the same home as a person with confirmed diagnosis of COVID-19 or a PUI (household contact)? No  Have you been diagnosed with COVID-19? No
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
## 2020-07-21 NOTE — Progress Notes (Signed)
   07/21/20 2151  Psych Admission Type (Psych Patients Only)  Admission Status Involuntary  Psychosocial Assessment  Patient Complaints Insomnia  Eye Contact Fair  Facial Expression Flat  Affect Appropriate to circumstance  Speech Logical/coherent  Interaction Assertive  Motor Activity Other (Comment) (WDL)  Appearance/Hygiene Unremarkable  Behavior Characteristics Appropriate to situation  Mood Anxious  Thought Process  Coherency WDL  Content WDL  Delusions None reported or observed  Perception WDL  Hallucination None reported or observed  Judgment Impaired  Confusion None  Danger to Self  Current suicidal ideation? Denies  Self-Injurious Behavior No self-injurious ideation or behavior indicators observed or expressed   Agreement Not to Harm Self Yes  Description of Agreement verbally contracts for safety  Danger to Others  Danger to Others None reported or observed

## 2020-07-22 DIAGNOSIS — F332 Major depressive disorder, recurrent severe without psychotic features: Secondary | ICD-10-CM | POA: Diagnosis not present

## 2020-07-22 MED ORDER — VENLAFAXINE HCL ER 150 MG PO CP24
300.0000 mg | ORAL_CAPSULE | Freq: Every day | ORAL | 0 refills | Status: DC
Start: 2020-07-23 — End: 2021-04-10

## 2020-07-22 MED ORDER — FOLIC ACID 1 MG PO TABS
1.0000 mg | ORAL_TABLET | Freq: Every day | ORAL | 0 refills | Status: DC
Start: 2020-07-23 — End: 2022-08-08

## 2020-07-22 MED ORDER — PRAZOSIN HCL 1 MG PO CAPS
1.0000 mg | ORAL_CAPSULE | Freq: Every day | ORAL | 0 refills | Status: DC
Start: 2020-07-22 — End: 2021-04-10

## 2020-07-22 MED ORDER — HYDROXYZINE HCL 25 MG PO TABS
25.0000 mg | ORAL_TABLET | Freq: Three times a day (TID) | ORAL | 0 refills | Status: DC | PRN
Start: 2020-07-22 — End: 2021-04-10

## 2020-07-22 MED ORDER — VALACYCLOVIR HCL 500 MG PO TABS
500.0000 mg | ORAL_TABLET | Freq: Every day | ORAL | 0 refills | Status: DC
Start: 2020-07-23 — End: 2020-11-14

## 2020-07-22 MED ORDER — THIAMINE HCL 100 MG PO TABS: 100.0000 mg | ORAL_TABLET | Freq: Every day | ORAL | 0 refills | Status: DC

## 2020-07-22 MED ORDER — HYDROCHLOROTHIAZIDE 25 MG PO TABS
25.0000 mg | ORAL_TABLET | Freq: Every day | ORAL | 0 refills | Status: DC
Start: 2020-07-23 — End: 2020-08-15

## 2020-07-22 MED ORDER — LISINOPRIL 20 MG PO TABS
20.0000 mg | ORAL_TABLET | Freq: Every day | ORAL | 0 refills | Status: DC
Start: 2020-07-23 — End: 2020-08-15

## 2020-07-22 MED ORDER — OLANZAPINE 7.5 MG PO TABS
7.5000 mg | ORAL_TABLET | Freq: Every day | ORAL | 0 refills | Status: DC
Start: 2020-07-22 — End: 2021-04-10

## 2020-07-22 NOTE — Progress Notes (Signed)
Recreation Therapy Notes  Date:  8.30.21 Time: 0930 Location: 300 Hall Group Room  Group Topic: Stress Management  Goal Area(s) Addresses:  Patient will identify positive stress management techniques. Patient will identify benefits of using stress management post d/c.  Intervention: Stress Management  Activity:  Guided Imagery.  LRT read a script that took patients on a journey through the forest.  Patients were to listen and follow along as script was read to engage in activity.    Education:  Stress Management, Discharge Planning.   Education Outcome: Acknowledges Education  Clinical Observations/Feedback: Pt did not attend activity.    Victorino Sparrow, LRT/CTRS         Victorino Sparrow A 07/22/2020 11:35 AM

## 2020-07-22 NOTE — Progress Notes (Signed)
   07/22/20 2100  COVID-19 Daily Checkoff  Have you had a fever (temp > 37.80C/100F)  in the past 24 hours?  No  COVID-19 EXPOSURE  Have you traveled outside the state in the past 14 days? No  Have you been in contact with someone with a confirmed diagnosis of COVID-19 or PUI in the past 14 days without wearing appropriate PPE? No  Have you been living in the same home as a person with confirmed diagnosis of COVID-19 or a PUI (household contact)? No  Have you been diagnosed with COVID-19? No

## 2020-07-22 NOTE — BHH Group Notes (Signed)
Occupational Therapy Group Note Date: 07/22/2020 Group Topic/Focus: Self-Esteem  Group Description: Group encouraged increased engagement and participation through discussion and activity focused on self-esteem. Patients explored and discussed the differences between healthy and low self-esteem and how it affects our daily lives and occupations with a focus on relationships, work, school, self-care, and personal leisure interests. Group discussion then transitioned into identifying specific strategies to boost self-esteem and engaged in a collaborative and independent activity looking at positive ways to describe oneself A-Z.  Participation Level: Active   Participation Quality: Independent   Behavior: Cooperative and Interactive   Speech/Thought Process: Focused   Affect/Mood: Euthymic   Insight: Fair   Judgement: Fair   Individualization: [name] was active in her participation of discussion and activity, however appeared distracted at times and observed engaging in a coloring task unrelated to group. Pt redirectable with min verbal cues and identified having periods of both low and high self-esteem, impacting her ability to obtain and hold a job for an extended period of time. Appeared receptive to education and words of support offered to her from peers.   Modes of Intervention: Activity, Discussion, Education and Socialization  Patient Response to Interventions:  Engaged, Receptive and Interested   Plan: Continue to engage patient in OT groups 2 - 3x/week.  07/22/2020  Ponciano Ort, MOT, OTR/L

## 2020-07-22 NOTE — Progress Notes (Signed)
D:  Patient's self inventory sheet, patient sleeps good, sleep medication helpful.  Fair appetite, normal energy level, good concentration.  Rated depression 5, anxiety and hopeless #4.  Denied withdrawals.  Denied SI.  Denied physical problems.  Denied physical pain.  Goal is discharge.  Does have discharge plans. A:  Medications administered per MD orders.  Emotional support and encouragement given patient. R:  Denied SI and HI, contracts for safety.  Denied A/V hallucinations.  Safety maintained with 15 minute checks.

## 2020-07-22 NOTE — Plan of Care (Signed)
Nurse discussed anxiety, depression and coping skills with patient.  

## 2020-07-22 NOTE — B[facility] Suicide Risk Assessment (Signed)
[facility] Discharge Suicide Risk Assessment   Principal Problem: Severe recurrent major depression without psychotic features [facility]) Discharge Diagnoses: Principal Problem:   Severe recurrent major depression without psychotic features ([place]) Active Problems:   Substance induced mood disorder ([place])   Total Time spent with patient: 15 minutes  Musculoskeletal: Strength & Muscle Tone: within normal limits Gait & Station: normal Patient leans: N/A  Psychiatric Specialty Exam: Review of Systems  All other systems reviewed and are negative.   Blood pressure 110/88, pulse 82, temperature 97.8 F (36.6 C), temperature source Oral, resp. rate 18, height 5\' 1"  (1.549 m), weight 76.2 kg, SpO2 99 %.Body mass index is 31.74 kg/m.  General Appearance: Casual  Eye Contact::  Good  Speech:  Normal Rate409  Volume:  Normal  Mood:  Euthymic  Affect:  Congruent  Thought Process:  Coherent and Descriptions of Associations: Intact  Orientation:  Full (Time, Place, and Person)  Thought Content:  Logical  Suicidal Thoughts:  No  Homicidal Thoughts:  No  Memory:  Immediate;   Good Recent;   Good Remote;   Good  Judgement:  Intact  Insight:  Fair  Psychomotor Activity:  Normal  Concentration:  Good  Recall:  Good  Fund of Knowledge:Good  Language: Good  Akathisia:  Negative  Handed:  Right  AIMS (if indicated):     Assets:  Desire for Improvement Resilience  Sleep:  Number of Hours: 5.5  Cognition: WNL  ADL's:  Intact   Mental Status Per Nursing Assessment::   On Admission:  NA  Demographic Factors:  Divorced or widowed, Caucasian, Low socioeconomic status, Living alone and Unemployed  Loss Factors: Financial problems/change in socioeconomic status  Historical Factors: Impulsivity  Risk Reduction Factors:   Positive coping skills or problem solving skills  Continued Clinical Symptoms:  Bipolar Disorder:   Depressive phase Alcohol/Substance Abuse/Dependencies  Cognitive  Features That Contribute To Risk:  None    Suicide Risk:  Minimal: No identifiable suicidal ideation.  Patients presenting with no risk factors but with morbid ruminations; may be classified as minimal risk based on the severity of the depressive symptoms   Follow-up [facility] Follow up.   Specialty: Addiction Medicine Contact information: [place] [address] [phone]        Services, [facility] Follow up.   Contact information: [address] [address] [phone]        [facility] Follow up.   Contact information: [address] [place] [address] [phone]        [facility] Follow up.   Contact information: [place], [place] [address]  Phone: [phone] Fax:        [place] [facility] on 07/26/2020.   Why: You have a hospital discharge appointment for therapy and medication management services on 07/26/20 at 12:30 pm.  This appointment will be held in person. Contact information: [place] [address] [phone]               Plan Of Care/Follow-up recommendations:  Activity:  ad lib  Sharma Covert, MD 07/22/2020, 5:42 PM
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
## 2020-07-22 NOTE — Progress Notes (Addendum)
Adult Psychoeducational Group Note  Date:  07/22/2020 Time:  11:37 AM  Group Topic/Focus:  Goals Group:   The focus of this group is to help patients establish daily goals to achieve during treatment and discuss how the patient can incorporate goal setting into their daily lives to aide in recovery.  Participation Level:  Active  Participation Quality:  Appropriate  Affect:  Appropriate  Cognitive:  Appropriate  Insight: Appropriate  Engagement in Group:  Engaged  Modes of Intervention:  Discussion  Additional Comments:  Patient attended Goal Setting and Stress Management group and participated.    Zyan Mirkin W Louana Fontenot 07/09/6195, 11:37 AM

## 2020-07-22 NOTE — Progress Notes (Signed)
D:  Patient's self inventory sheet, patient sleeps good, sleep medication helpful.  Fair appetite, normal energy level, good concentration.  Rated depression 5, hopeless and anxiety 4.  Withdrawals denied.  Denied SI.  Denied physical problems   Denied physical pain.  Goal is discharge.  Does have discharge plans. A:  Medications administered per MD orders.  Emotional support and encouragement given patient. R:  Denied SI and HI, contracts for safety.  Denied A/V hallucinations.  Safety maintained with 15 minute checks.

## 2020-07-23 DIAGNOSIS — F332 Major depressive disorder, recurrent severe without psychotic features: Secondary | ICD-10-CM | POA: Diagnosis not present

## 2020-07-23 NOTE — Progress Notes (Signed)
Patient ID: [name], female   DOB: 06-15-1976, 44 y.o.   MRN: [id number]  Pt alert and oriented on the unit. Education, support, and encouragement provided. Discharge summary, medications and follow up appointments reviewed with pt. Suicide prevention resources provided, including "My 3 App." Pt's belongings in locker # 14 returned and belongings sheet signed. Pt denies SI/HI, A/VH, pain, or any concerns at this time. Pt ambulatory on and off unit. Pt discharged to lobby for Transport via Civil engineer, contracting to  [facility].

## 2020-07-23 NOTE — Progress Notes (Signed)
Pt was asking about her blue jeans tonight. Pt said that they should be in her locker, but they're not listed on her belongings list and nor are they in her locker when staff checked. Pt said that her daughter had brought her clothes on 2 occassions: on admission and one other time. Pt believes the staff member assisting her that day didn't list the item on her belongings list. Pt reassured her that this matter will be brought to supervisory staff and reported to the oncoming shift. Pt was observed tonight talking loudly to someone on the phone. Pt said that she's supposed to be discharged tomorrow and is going to [employer] [facility] in [place]. Pt said that she needs transportation to be here at 8 AM, so she can be there at 9 AM and that she told social work about that. After hours safe transport # was called and notified this Probation officer to call at 7:30 AM to notify their driver. Pt said that she's looking forward to going to [facility], but is also scared because it will be a new environment for her. Pt provided active listening, reassurance, and support. Medications administered as ordered by MD. Pt denies SI/HI and AVH. Q 15 min safety checks continue. Pt's safety has been maintained.    07/22/20 2100  Psych Admission Type (Psych Patients Only)  Admission Status Involuntary  Psychosocial Assessment  Patient Complaints Anxiety;Depression;Worrying  Eye Contact Fair  Facial Expression Flat  Affect Appropriate to circumstance  Speech Logical/coherent  Interaction Assertive;Needy;Demanding  Motor Activity Other (Comment) (WDL)  Appearance/Hygiene Unremarkable  Behavior Characteristics Cooperative;Anxious;Calm  Mood Depressed;Anxious  Thought Process  Coherency WDL  Content WDL  Delusions None reported or observed  Perception WDL  Hallucination None reported or observed  Judgment WDL  Confusion None  Danger to Self  Current suicidal ideation? Denies  Self-Injurious Behavior No self-injurious  ideation or behavior indicators observed or expressed   Danger to Others  Danger to Others None reported or observed     07/22/20 2100  Psych Admission Type (Psych Patients Only)  Admission Status Involuntary  Psychosocial Assessment  Patient Complaints Anxiety;Depression;Worrying  Eye Contact Fair  Facial Expression Flat  Affect Appropriate to circumstance  Speech Logical/coherent  Interaction Assertive;Needy;Demanding  Motor Activity Other (Comment) (WDL)  Appearance/Hygiene Unremarkable  Behavior Characteristics Cooperative;Anxious;Calm  Mood Depressed;Anxious  Thought Process  Coherency WDL  Content WDL  Delusions None reported or observed  Perception WDL  Hallucination None reported or observed  Judgment WDL  Confusion None  Danger to Self  Current suicidal ideation? Denies  Self-Injurious Behavior No self-injurious ideation or behavior indicators observed or expressed   Danger to Others  Danger to Others None reported or observed
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
## 2020-07-23 NOTE — Progress Notes (Signed)
Pt said that she'll need samples of her discharge medications. Pt informed that oncoming nurse in AM will be notified to consult with pharmacy for her request.

## 2020-07-23 NOTE — Progress Notes (Signed)
Pt said that she found her blue jeans this morning. She said that they were at the bottom of the brown bag that she had in her room.

## 2020-07-23 NOTE — Progress Notes (Signed)
This writer went over [facility] discharge packet with pt regarding follow-up appointments and medications to be continued upon discharge. Pt instructed to take medications as prescribed by her provider. Educated about potential adverse effects that need to be reported to her outpatient provider ASAP. Pt also educated about not drinking ETOH or using illegal drugs while taking her prescription medications. Pt has been provided her suicide risk assessment completed by her MD and transition record with her lab results. Provided opportunity to ask any questions. Pt has been provided resources/contact numbers to reach out to for suicidal thoughts and mental health related needs. Pt denies SI/HI and AVH. Pt's expected discharge time in AM is 8 AM. Pt will be using safe transport to go to [facility] in [place], [place].

## 2020-07-23 NOTE — Progress Notes (Signed)
°  [facility] Adult Case Management Discharge Plan :  Will you be returning to the same living situation after discharge:  No. To transfer to residential substance use treatment.  At discharge, do you have transportation home?: No. Safe Transport has been arranged. Do you have the ability to pay for your medications: No. Samples to be provided.   Release of information consent forms completed and in the chart;  Patient's signature needed at discharge.  Patient to Follow up at:  Follow-up [place] Follow up.   Specialty: Addiction Medicine Contact information: [place] [address] [phone]        [facility] Follow up.   Contact information: [address] [place] [address] [phone]        [facility] Follow up.   Contact information: [address] [place] [address] [phone]        [facility] Follow up.   Contact information: [place], [place] [address]  Phone: [phone] Fax:        [place] on 07/26/2020.   Why: You have a hospital discharge appointment for therapy and medication management services on 07/26/20 at 12:30 pm.  This appointment will be held in person. Contact information: [place] [address] [phone]               Next level of care provider has access to [place] and Suicide Prevention discussed: Yes,  with patient.  Have you used any form of tobacco in the last 30 days? (Cigarettes, Smokeless Tobacco, Cigars, and/or Pipes): Yes  Has patient been referred to the Quitline?: Patient refused referral  Patient has been referred for addiction treatment: Yes  Vassie Moselle, LCSW 07/23/2020, 8:25 AM

## 2020-07-24 MED FILL — PRAZOSIN 1 MG CAPSULE: 1 | 30 days supply | Qty: 30 | Fill #0

## 2020-07-24 MED FILL — LISINOPRIL 20 MG TABLET: 20 | 30 days supply | Qty: 30 | Fill #0

## 2020-07-24 MED FILL — VALACYCLOVIR HCL 500 MG TAB: 500 | 4 days supply | Qty: 4 | Fill #0

## 2020-07-24 MED FILL — FOLIC ACID 1 MG TABS: 1 | 30 days supply | Qty: 30 | Fill #0

## 2020-07-24 MED FILL — HYDROCHLOROTHIAZIDE 25 MG T: 25 | 30 days supply | Qty: 30 | Fill #0

## 2020-07-24 MED FILL — VENLAFAXINE HCL ER 150 MG C: 150 | 23 days supply | Qty: 46 | Fill #0

## 2020-07-24 MED FILL — OLANZapine 7.5 MG TABS: 7.5 | 30 days supply | Qty: 30 | Fill #0

## 2020-07-24 MED FILL — hydrOXYzine HCL 25 MG TABS: 25 | 10 days supply | Qty: 30 | Fill #0

## 2020-07-30 MED FILL — PANTOPRAZOLE SOD DR 40 MG T: 40 | 30 days supply | Qty: 30 | Fill #1

## 2020-07-30 MED FILL — AMLODIPINE BESYLATE 10 MG T: 10 | 30 days supply | Qty: 30 | Fill #1

## 2020-07-31 MED FILL — MIRTAZAPINE 30 MG TABLET: 30 | 30 days supply | Qty: 30 | Fill #0

## 2020-07-31 MED FILL — GABAPENTIN 300 MG CAPSULE: 300 | 30 days supply | Qty: 90 | Fill #0

## 2020-08-15 ENCOUNTER — Other Ambulatory Visit: Payer: Self-pay | Admitting: Physician Assistant

## 2020-08-15 DIAGNOSIS — I10 Essential (primary) hypertension: Secondary | ICD-10-CM

## 2020-08-15 DIAGNOSIS — K219 Gastro-esophageal reflux disease without esophagitis: Secondary | ICD-10-CM

## 2020-08-15 DIAGNOSIS — R7303 Prediabetes: Secondary | ICD-10-CM

## 2020-08-15 MED ORDER — PANTOPRAZOLE SODIUM 40 MG PO TBEC: 40.0000 mg | DELAYED_RELEASE_TABLET | Freq: Every day | ORAL | 0 refills | Status: DC

## 2020-08-15 MED ORDER — METFORMIN HCL 500 MG PO TABS: 500.0000 mg | ORAL_TABLET | Freq: Every day | ORAL | 0 refills | Status: DC

## 2020-08-15 MED ORDER — AMLODIPINE BESYLATE 10 MG PO TABS: 10.0000 mg | ORAL_TABLET | Freq: Every day | ORAL | 0 refills | Status: DC

## 2020-08-15 MED FILL — METFORMIN HCL 500 MG TABS: 500 | 30 days supply | Qty: 30 | Fill #0

## 2020-08-15 NOTE — Telephone Encounter (Signed)
Pt request refill   amLODipine (NORVASC) 10 MG tablet  lisinopril (ZESTRIL) 20 MG tablet hydrochlorothiazide (HYDRODIURIL) 25 MG tablet pantoprazole (PROTONIX) 40 MG tablet metFORMIN (GLUCOPHAGE) 500 MG tablet  [facility], [place] [address] Phone:  [phone]  Fax:  [phone]     Pt has made appt for 10/14, but is out of medication. Pt requesting a 30 day supply to get her through to this appt.

## 2020-08-15 NOTE — Telephone Encounter (Signed)
Requested medication (s) are due for refill today:  Yes  Requested medication (s) are on the active medication list:  Yes  Future visit scheduled:  Yes  Last Refill: HCTZ 07/23/20; #30/ no refills                   Lisinopril 07/23/20; #30/ no refills  Notes to Clinic:  last Rx on above meds were prescribed by Dr. Mallie Darting in [place]  Requested Prescriptions  Pending Prescriptions Disp Refills   lisinopril (ZESTRIL) 20 MG tablet 30 tablet 0    Sig: Take 1 tablet (20 mg total) by mouth daily.      Cardiovascular:  ACE Inhibitors Failed - 08/15/2020 11:17 AM      Failed - Last BP in normal range    BP Readings from Last 1 Encounters:  07/15/20 128/88          Passed - Cr in normal range and within 180 days    Creat  Date Value Ref Range Status  09/04/2015 0.66 0.50 - 1.10 mg/dL Final   Creatinine, Ser  Date Value Ref Range Status  07/21/2020 0.65 0.44 - 1.00 mg/dL Final          Passed - K in normal range and within 180 days    Potassium  Date Value Ref Range Status  07/21/2020 3.8 3.5 - 5.1 mmol/L Final          Passed - Patient is not pregnant      Passed - Valid encounter within last 6 months    Recent Outpatient Visits           2 months ago Severe recurrent major depression without psychotic features [facility])   [place] Ladell Pier, MD   2 years ago Dental anomaly   [place], Enobong, MD   3 years ago Essential hypertension   [place], Marlena Clipper, MD   3 years ago Hypertension, unspecified type   [place], [place], [place]   4 years ago Menorrhagia with irregular cycle   Primary Care at [facility], Renette Butters, MD       Future Appointments             In 3 weeks Thereasa Solo Casimer Bilis [place]              hydrochlorothiazide (HYDRODIURIL) 25 MG tablet  30 tablet 0    Sig: Take 1 tablet (25 mg total) by mouth daily.      Cardiovascular: Diuretics - Thiazide Failed - 08/15/2020 11:17 AM      Failed - Na in normal range and within 360 days    Sodium  Date Value Ref Range Status  07/21/2020 134 (L) 135 - 145 mmol/L Final          Failed - Last BP in normal range    BP Readings from Last 1 Encounters:  07/15/20 128/88          Passed - Ca in normal range and within 360 days    Calcium  Date Value Ref Range Status  07/21/2020 9.2 8.9 - 10.3 mg/dL Final   Calcium, Ion  Date Value Ref Range Status  11/03/2016 1.19 1.15 - 1.40 mmol/L Final          Passed - Cr in normal range and within 360 days  Creat  Date Value Ref Range Status  09/04/2015 0.66 0.50 - 1.10 mg/dL Final   Creatinine, Ser  Date Value Ref Range Status  07/21/2020 0.65 0.44 - 1.00 mg/dL Final          Passed - K in normal range and within 360 days    Potassium  Date Value Ref Range Status  07/21/2020 3.8 3.5 - 5.1 mmol/L Final          Passed - Valid encounter within last 6 months    Recent Outpatient Visits           2 months ago Severe recurrent major depression without psychotic features [facility])   [place], Deborah B, MD   2 years ago Dental anomaly   [place], Enobong, MD   3 years ago Essential hypertension   [place], Marlena Clipper, MD   3 years ago Hypertension, unspecified type   [place], [place], [place]   4 years ago Menorrhagia with irregular cycle   Primary Care at [facility], Renette Butters, MD       Future Appointments             In 3 weeks Thereasa Solo Casimer Bilis [place]             Signed Prescriptions Disp Refills   amLODipine (NORVASC) 10 MG tablet 30 tablet 0    Sig: Take 1 tablet (10 mg total) by mouth daily.       Cardiovascular:  Calcium Channel Blockers Failed - 08/15/2020 11:17 AM      Failed - Last BP in normal range    BP Readings from Last 1 Encounters:  07/15/20 128/88          Passed - Valid encounter within last 6 months    Recent Outpatient Visits           2 months ago Severe recurrent major depression without psychotic features [facility])   [place] Ladell Pier, MD   2 years ago Dental anomaly   [place], Enobong, MD   3 years ago Essential hypertension   [place], Marlena Clipper, MD   3 years ago Hypertension, unspecified type   [place], [place], [place]   4 years ago Menorrhagia with irregular cycle   Primary Care at [facility], Renette Butters, MD       Future Appointments             In 3 weeks Thereasa Solo Casimer [facility] [place]              pantoprazole (PROTONIX) 40 MG tablet 30 tablet 0    Sig: Take 1 tablet (40 mg total) by mouth daily.      Gastroenterology: Proton Pump Inhibitors Passed - 08/15/2020 11:17 AM      Passed - Valid encounter within last 12 months    Recent Outpatient Visits           2 months ago Severe recurrent major depression without psychotic features [facility])   [facility] Ladell Pier, MD   2 years ago Dental anomaly   [place], Enobong, MD   3  years ago Essential hypertension   [place], [place], [place]   3 years ago Hypertension, unspecified type   [place], [place], [place]   4 years ago Menorrhagia with irregular cycle   Primary Care at [facility], Renette Butters, MD       Future Appointments             In 3 weeks Thereasa Solo Casimer Bilis [place]               metFORMIN (GLUCOPHAGE) 500 MG tablet 30 tablet 0    Sig: Take 1 tablet (500 mg total) by mouth daily. For prediabetes      Endocrinology:  Diabetes - Biguanides Passed - 08/15/2020 11:17 AM      Passed - Cr in normal range and within 360 days    Creat  Date Value Ref Range Status  09/04/2015 0.66 0.50 - 1.10 mg/dL Final   Creatinine, Ser  Date Value Ref Range Status  07/21/2020 0.65 0.44 - 1.00 mg/dL Final          Passed - HBA1C is between 0 and 7.9 and within 180 days    Hgb A1c MFr Bld  Date Value Ref Range Status  07/17/2020 5.4 4.8 - 5.6 % Final    Comment:    (NOTE) Pre diabetes:          5.7%-6.4%  Diabetes:              >6.4%  Glycemic control for   <7.0% adults with diabetes           Passed - eGFR in normal range and within 360 days    GFR, Est African American  Date Value Ref Range Status  06/03/2014 >89 mL/min Final   GFR calc Af Amer  Date Value Ref Range Status  07/21/2020 >60 >60 mL/min Final   GFR, Est Non African American  Date Value Ref Range Status  06/03/2014 >89 mL/min Final    Comment:      The estimated GFR is a calculation valid for adults (>=41 years old) that uses the CKD-EPI algorithm to adjust for age and sex. It is   not to be used for children, pregnant women, hospitalized patients,    patients on dialysis, or with rapidly changing kidney function. According to the NKDEP, eGFR >89 is normal, 60-89 shows mild impairment, 30-59 shows moderate impairment, 15-29 shows severe impairment and <15 is ESRD.     GFR calc non Af Amer  Date Value Ref Range Status  07/21/2020 >60 >60 mL/min Final          Passed - Valid encounter within last 6 months    Recent Outpatient Visits           2 months ago Severe recurrent major depression without psychotic features [facility])   [place] Ladell Pier, MD   2 years ago Dental anomaly   [place], Enobong, MD   3 years  ago Essential hypertension   [place], Marlena Clipper, MD   3 years ago Hypertension, unspecified type   [place], [place]   4 years ago Menorrhagia with irregular cycle   Primary Care at [facility], Renette Butters, MD       Future Appointments  In 3 weeks Thereasa Solo, Casimer Bilis [place]

## 2020-08-15 NOTE — Telephone Encounter (Signed)
Requested Prescriptions  Pending Prescriptions Disp Refills  . amLODipine (NORVASC) 10 MG tablet 30 tablet 0    Sig: Take 1 tablet (10 mg total) by mouth daily.     Cardiovascular:  Calcium Channel Blockers Failed - 08/15/2020 11:17 AM      Failed - Last BP in normal range    BP Readings from Last 1 Encounters:  07/15/20 128/88         Passed - Valid encounter within last 6 months    Recent Outpatient Visits          2 months ago Severe recurrent major depression without psychotic features [facility])   [place] Ladell Pier, MD   2 years ago Dental anomaly   [place], Enobong, MD   3 years ago Essential hypertension   [place], Marlena Clipper, MD   3 years ago Hypertension, unspecified type   [place], [place]   4 years ago Menorrhagia with irregular cycle   Primary Care at [facility], Renette Butters, MD      Future Appointments            In 3 weeks Thereasa Solo, Casimer Bilis [place]           . lisinopril (ZESTRIL) 20 MG tablet 30 tablet 0    Sig: Take 1 tablet (20 mg total) by mouth daily.     Cardiovascular:  ACE Inhibitors Failed - 08/15/2020 11:17 AM      Failed - Last BP in normal range    BP Readings from Last 1 Encounters:  07/15/20 128/88         Passed - Cr in normal range and within 180 days    Creat  Date Value Ref Range Status  09/04/2015 0.66 0.50 - 1.10 mg/dL Final   Creatinine, Ser  Date Value Ref Range Status  07/21/2020 0.65 0.44 - 1.00 mg/dL Final         Passed - K in normal range and within 180 days    Potassium  Date Value Ref Range Status  07/21/2020 3.8 3.5 - 5.1 mmol/L Final         Passed - Patient is not pregnant      Passed - Valid encounter within last 6 months    Recent Outpatient Visits          2 months ago Severe recurrent major  depression without psychotic features [facility])   [place] Ladell Pier, MD   2 years ago Dental anomaly   [place], Enobong, MD   3 years ago Essential hypertension   [place], Marlena Clipper, MD   3 years ago Hypertension, unspecified type   [place], [place]   4 years ago Menorrhagia with irregular cycle   Primary Care at [facility], Renette Butters, MD      Future Appointments            In 3 weeks Thereasa Solo, Casimer Bilis [place]           . hydrochlorothiazide (HYDRODIURIL) 25 MG tablet 30 tablet 0    Sig: Take 1 tablet (25 mg total) by mouth daily.     Cardiovascular: Diuretics -  Thiazide Failed - 08/15/2020 11:17 AM      Failed - Na in normal range and within 360 days    Sodium  Date Value Ref Range Status  07/21/2020 134 (L) 135 - 145 mmol/L Final         Failed - Last BP in normal range    BP Readings from Last 1 Encounters:  07/15/20 128/88         Passed - Ca in normal range and within 360 days    Calcium  Date Value Ref Range Status  07/21/2020 9.2 8.9 - 10.3 mg/dL Final   Calcium, Ion  Date Value Ref Range Status  11/03/2016 1.19 1.15 - 1.40 mmol/L Final         Passed - Cr in normal range and within 360 days    Creat  Date Value Ref Range Status  09/04/2015 0.66 0.50 - 1.10 mg/dL Final   Creatinine, Ser  Date Value Ref Range Status  07/21/2020 0.65 0.44 - 1.00 mg/dL Final         Passed - K in normal range and within 360 days    Potassium  Date Value Ref Range Status  07/21/2020 3.8 3.5 - 5.1 mmol/L Final         Passed - Valid encounter within last 6 months    Recent Outpatient Visits          2 months ago Severe recurrent major depression without psychotic features [facility])   [place], Deborah B, MD   2 years  ago Dental anomaly   [place], Enobong, MD   3 years ago Essential hypertension   [place], Marlena Clipper, MD   3 years ago Hypertension, unspecified type   [place], [place]   4 years ago Menorrhagia with irregular cycle   Primary Care at [facility], Renette Butters, MD      Future Appointments            In 3 weeks Thereasa Solo, Casimer Bilis [place]           . pantoprazole (PROTONIX) 40 MG tablet 30 tablet 0    Sig: Take 1 tablet (40 mg total) by mouth daily.     Gastroenterology: Proton Pump Inhibitors Passed - 08/15/2020 11:17 AM      Passed - Valid encounter within last 12 months    Recent Outpatient Visits          2 months ago Severe recurrent major depression without psychotic features [facility])   [place] Ladell Pier, MD   2 years ago Dental anomaly   [place], Enobong, MD   3 years ago Essential hypertension   [place], Marlena Clipper, MD   3 years ago Hypertension, unspecified type   [place], [place]   4 years ago Menorrhagia with irregular cycle   Primary Care at [facility], Renette Butters, MD      Future Appointments            In 3 weeks Thereasa Solo Casimer Bilis [place]           . metFORMIN (GLUCOPHAGE) 500 MG tablet 30 tablet 0    Sig: Take  1 tablet (500 mg total) by mouth daily. For prediabetes     Endocrinology:  Diabetes - Biguanides Passed - 08/15/2020 11:17 AM      Passed - Cr in normal range and within 360 days    Creat  Date Value Ref Range Status  09/04/2015 0.66 0.50 - 1.10 mg/dL Final   Creatinine, Ser  Date Value Ref Range Status  07/21/2020 0.65 0.44 - 1.00 mg/dL Final         Passed - HBA1C  is between 0 and 7.9 and within 180 days    Hgb A1c MFr Bld  Date Value Ref Range Status  07/17/2020 5.4 4.8 - 5.6 % Final    Comment:    (NOTE) Pre diabetes:          5.7%-6.4%  Diabetes:              >6.4%  Glycemic control for   <7.0% adults with diabetes          Passed - eGFR in normal range and within 360 days    GFR, Est African American  Date Value Ref Range Status  06/03/2014 >89 mL/min Final   GFR calc Af Amer  Date Value Ref Range Status  07/21/2020 >60 >60 mL/min Final   GFR, Est Non African American  Date Value Ref Range Status  06/03/2014 >89 mL/min Final    Comment:      The estimated GFR is a calculation valid for adults (>=17 years old) that uses the CKD-EPI algorithm to adjust for age and sex. It is   not to be used for children, pregnant women, hospitalized patients,    patients on dialysis, or with rapidly changing kidney function. According to the NKDEP, eGFR >89 is normal, 60-89 shows mild impairment, 30-59 shows moderate impairment, 15-29 shows severe impairment and <15 is ESRD.     GFR calc non Af Amer  Date Value Ref Range Status  07/21/2020 >60 >60 mL/min Final         Passed - Valid encounter within last 6 months    Recent Outpatient Visits          2 months ago Severe recurrent major depression without psychotic features [facility])   [place], MD   2 years ago Dental anomaly   [place], Enobong, MD   3 years ago Essential hypertension   [place], Marlena Clipper, MD   3 years ago Hypertension, unspecified type   [place], [place]   4 years ago Menorrhagia with irregular cycle   Primary Care at [facility], Renette Butters, MD      Future Appointments            In 3 weeks Thereasa Solo Casimer Bilis [place]            Pt. Has f/u  appt. 09/05/20; gave [facility] refill #30 @ this time for Amlodipine, Pantoprazole, and Metformin.

## 2020-08-16 ENCOUNTER — Other Ambulatory Visit: Payer: Self-pay | Admitting: Family Medicine

## 2020-08-16 MED ORDER — LISINOPRIL 20 MG PO TABS
20.0000 mg | ORAL_TABLET | Freq: Every day | ORAL | 0 refills | Status: DC
Start: 2020-08-16 — End: 2020-09-05

## 2020-08-16 MED ORDER — HYDROCHLOROTHIAZIDE 25 MG PO TABS
25.0000 mg | ORAL_TABLET | Freq: Every day | ORAL | 0 refills | Status: DC
Start: 2020-08-16 — End: 2020-09-05

## 2020-08-16 MED FILL — HYDROCHLOROTHIAZIDE 25 MG T: 25 | 30 days supply | Qty: 30 | Fill #0

## 2020-08-16 MED FILL — LISINOPRIL 20 MG TABLET: 20 | 30 days supply | Qty: 30 | Fill #0

## 2020-08-26 ENCOUNTER — Other Ambulatory Visit: Payer: Self-pay

## 2020-08-26 MED FILL — OLANZapine 7.5 MG TABS: 7.5 | 30 days supply | Qty: 30 | Fill #0

## 2020-08-26 MED FILL — PRAZOSIN 1 MG CAPSULE: 1 | 30 days supply | Qty: 30 | Fill #0

## 2020-08-26 MED FILL — GABAPENTIN 300 MG CAPSULE: 300 | 30 days supply | Qty: 90 | Fill #0

## 2020-08-26 MED FILL — VENLAFAXINE HCL ER 150 MG C: 150 | 30 days supply | Qty: 60 | Fill #0

## 2020-08-26 MED FILL — ATOMOXETINE HCL 10 MG CAP: 10 | 30 days supply | Qty: 30 | Fill #0

## 2020-08-26 MED FILL — hydrOXYzine HCL 50 MG TABS: 50 | 30 days supply | Qty: 60 | Fill #0

## 2020-08-29 MED FILL — LISINOPRIL 20 MG TABLET: 20 | 30 days supply | Qty: 30 | Fill #0

## 2020-08-29 MED FILL — AMLODIPINE BESYLATE 10 MG T: 10 | 30 days supply | Qty: 30 | Fill #2

## 2020-08-29 MED FILL — PANTOPRAZOLE SOD DR 40 MG T: 40 | 30 days supply | Qty: 30 | Fill #2

## 2020-08-29 MED FILL — HYDROCHLOROTHIAZIDE 25 MG T: 25 | 30 days supply | Qty: 30 | Fill #0

## 2020-09-04 NOTE — Progress Notes (Signed)
Patient ID: [name], female   DOB: [birth year], 44 y.o.   MRN: [id number]   Virtual Visit via Telephone Note  I connected with Austen Wygant on 09/05/20 at  2:50 PM EDT by telephone and verified that I am speaking with the correct person using two identifiers.   I discussed the limitations, risks, security and privacy concerns of performing an evaluation and management service by telephone and the availability of in person appointments. I also discussed with the patient that there may be a patient responsible charge related to this service. The patient expressed understanding and agreed to proceed.  PATIENT visit by telephone virtually in the context of Covid-19 pandemic. Patient location:  home My Location:  [place] office Persons on the call:  Me, the patient and roomed by Francisco Capuchin.     History of Present Illness: After ED visit 08/28/2020.   No further N/V.  Tooth infection/extraction area healed.    BP ~120/80 after resuming meds.    Wants pill for weight loss.    From ED note: I reviewed patient's laboratory work noting only a mild hypokalemia otherwise patient's laboratory work was unremarkable Patient does have elevated blood pressure and a history of high blood pressure in which patient takes amlodipine and hydrochlorothiazide patient states she has taken her medications today. I do not see any evidence of dental infection. I think the swelling is due to the extraction that was performed yesterday. I have instructed the patient that she should start taking her antibiotics as soon as possible. Patient is not toxic. Patient has just stated to me that she needs to leave because her ride is here and she does not want to have to walk home. Patient did not receive her discharge instructions.  [DS]   ED Course User Index [DS] Hilario Quarry, PA-C   In my opinion, this is a condition that a prudent lay person (who possesses an average knowledge of health and medicine) may  expect to result in serious jeopardy, or cause serious impairment of bodily function, or serious dysfunction of bodily organs.  Procedure Note  Procedures  ED Clinical Impression   1. Nausea  2. Dentalgia  3. Essential hypertension     Observations/Objective: NAD.  A&Ox3  Assessment and Plan: 1. Essential hypertension Controlled based on her OOO readings - amLODipine (NORVASC) 10 MG tablet; Take 1 tablet (10 mg total) by mouth daily.  Dispense: 30 tablet; Refill: 2 - hydrochlorothiazide (HYDRODIURIL) 25 MG tablet; Take 1 tablet (25 mg total) by mouth daily.  Dispense: 30 tablet; Refill: 2 - lisinopril (ZESTRIL) 20 MG tablet; Take 1 tablet (20 mg total) by mouth daily.  Dispense: 30 tablet; Refill: 2  2. Overweight Says she is 5'1" and weighs 180.  We do not offer weight loss meds - Amb Referral to Bariatric Surgery  3. Heartburn - omeprazole (PRILOSEC) 40 MG capsule; Take 1 capsule (40 mg total) by mouth daily.  Dispense: 30 capsule; Refill: 3    Follow Up Instructions: Assign PCP 8-10 weeks   I discussed the assessment and treatment plan with the patient. The patient was provided an opportunity to ask questions and all were answered. The patient agreed with the plan and demonstrated an understanding of the instructions.   The patient was advised to call back or seek an in-person evaluation if the symptoms worsen or if the condition fails to improve as anticipated.  I provided 12 minutes of non-face-to-face time during this encounter.   Freeman Caldron, PA-C

## 2020-09-05 ENCOUNTER — Ambulatory Visit: Payer: Self-pay | Attending: Physician Assistant | Admitting: Physician Assistant

## 2020-09-05 ENCOUNTER — Other Ambulatory Visit: Payer: Self-pay | Admitting: Physician Assistant

## 2020-09-05 ENCOUNTER — Encounter: Payer: Self-pay | Admitting: Physician Assistant

## 2020-09-05 ENCOUNTER — Other Ambulatory Visit: Payer: Self-pay

## 2020-09-05 DIAGNOSIS — E663 Overweight: Secondary | ICD-10-CM

## 2020-09-05 DIAGNOSIS — I10 Essential (primary) hypertension: Secondary | ICD-10-CM

## 2020-09-05 DIAGNOSIS — R12 Heartburn: Secondary | ICD-10-CM

## 2020-09-05 MED ORDER — OMEPRAZOLE 40 MG PO CPDR: 40.0000 mg | DELAYED_RELEASE_CAPSULE | Freq: Every day | ORAL | 3 refills | Status: DC

## 2020-09-05 MED ORDER — HYDROCHLOROTHIAZIDE 25 MG PO TABS: 25.0000 mg | ORAL_TABLET | Freq: Every day | ORAL | 2 refills | Status: DC

## 2020-09-05 MED ORDER — AMLODIPINE BESYLATE 10 MG PO TABS: 10.0000 mg | ORAL_TABLET | Freq: Every day | ORAL | 2 refills | Status: DC

## 2020-09-05 MED ORDER — LISINOPRIL 20 MG PO TABS: 20.0000 mg | ORAL_TABLET | Freq: Every day | ORAL | 2 refills | Status: DC

## 2020-09-06 MED FILL — OMEPRAZOLE DR 40 MG CAPSULE: 40 | 30 days supply | Qty: 30 | Fill #0

## 2020-09-23 ENCOUNTER — Other Ambulatory Visit: Payer: Self-pay

## 2020-09-23 MED FILL — GABAPENTIN 300 MG CAPSULE: 300 | 30 days supply | Qty: 90 | Fill #0

## 2020-09-23 MED FILL — OMEPRAZOLE DR 40 MG CAPSULE: 40 | 30 days supply | Qty: 30 | Fill #0

## 2020-09-23 MED FILL — PRAZOSIN 1 MG CAPSULE: 1 | 30 days supply | Qty: 30 | Fill #0

## 2020-09-23 MED FILL — LISINOPRIL 20 MG TABLET: 20 | 30 days supply | Qty: 30 | Fill #0

## 2020-09-23 MED FILL — ARIPiprazole 2 MG TABS: 2 | 30 days supply | Qty: 30 | Fill #0

## 2020-09-23 MED FILL — hydrOXYzine HCL 50 MG TABS: 50 | 30 days supply | Qty: 60 | Fill #0

## 2020-09-23 MED FILL — HYDROCHLOROTHIAZIDE 25 MG T: 25 | 30 days supply | Qty: 30 | Fill #0

## 2020-09-23 MED FILL — ATOMOXETINE HCL 25 MG CAPS: 25 | 30 days supply | Qty: 30 | Fill #0

## 2020-09-23 MED FILL — OLANZapine 7.5 MG TABS: 7.5 | 30 days supply | Qty: 30 | Fill #0

## 2020-09-23 MED FILL — AMLODIPINE BESYLATE 10 MG T: 10 | 30 days supply | Qty: 30 | Fill #3

## 2020-09-23 MED FILL — VENLAFAXINE HCL ER 150 MG C: 150 | 30 days supply | Qty: 60 | Fill #0

## 2020-09-27 ENCOUNTER — Ambulatory Visit: Payer: Medicaid Other

## 2020-10-02 ENCOUNTER — Other Ambulatory Visit: Payer: Self-pay | Admitting: Internal Medicine

## 2020-10-02 ENCOUNTER — Ambulatory Visit: Payer: Medicaid Other

## 2020-10-02 NOTE — Telephone Encounter (Signed)
Requested medication (s) are due for refill today -yes  Requested medication (s) are on the active medication list -yes  Future visit scheduled -no  Last refill: 2 months ago  Notes to clinic: Rx refill request- outside provider  Requested Prescriptions  Pending Prescriptions Disp Refills   valACYclovir (VALTREX) 500 MG tablet 4 tablet 0    Sig: Take 1 tablet (500 mg total) by mouth daily.      Antimicrobials:  Antiviral Agents - Anti-Herpetic Passed - 10/02/2020 11:48 AM      Passed - Valid encounter within last 12 months    Recent Outpatient Visits           3 weeks ago [place], [place], [place]   4 months ago Severe recurrent major depression without psychotic features [facility])   [place] Ladell Pier, MD   2 years ago Dental anomaly   [place], Enobong, MD   3 years ago Essential hypertension   [place], Marlena Clipper, MD   3 years ago Hypertension, unspecified type   [place], [place], [place]                  Requested Prescriptions  Pending Prescriptions Disp Refills   valACYclovir (VALTREX) 500 MG tablet 4 tablet 0    Sig: Take 1 tablet (500 mg total) by mouth daily.      Antimicrobials:  Antiviral Agents - Anti-Herpetic Passed - 10/02/2020 11:48 AM      Passed - Valid encounter within last 12 months    Recent Outpatient Visits           3 weeks ago [place], [place], [place]   4 months ago Severe recurrent major depression without psychotic features [facility])   [place] Ladell Pier, MD   2 years ago Dental anomaly   [place], Enobong, MD   3 years ago Essential hypertension   [place], [place] E, MD   3 years ago Hypertension, unspecified type   [place], [place], [place]

## 2020-10-02 NOTE — Telephone Encounter (Signed)
Copied from [place] [id number]. Topic: Quick Communication - Rx Refill/Question >> Oct 02, 2020 11:44 AM Leward Quan A wrote: Medication: valACYclovir (VALTREX) 500 MG tablet   Has the patient contacted their pharmacy? Yes.   (Agent: If no, request that the patient contact the pharmacy for the refill.) (Agent: If yes, when and what did the pharmacy advise?)  Preferred Pharmacy (with phone number or street name): [place], [place] [address]  Phone:  [phone] Fax:  [phone]     Agent: Please be advised that RX refills may take up to 3 business days. We ask that you follow-up with your pharmacy.

## 2020-10-22 ENCOUNTER — Other Ambulatory Visit: Payer: Self-pay

## 2020-10-22 MED FILL — ARIPiprazole 2 MG TABS: 2 | 30 days supply | Qty: 30 | Fill #0

## 2020-10-22 MED FILL — VENLAFAXINE HCL ER 150 MG C: 150 | 30 days supply | Qty: 60 | Fill #0

## 2020-10-22 MED FILL — GABAPENTIN 300 MG CAPSULE: 300 | 30 days supply | Qty: 90 | Fill #0

## 2020-10-22 MED FILL — ATOMOXETINE HCL 25 MG CAPS: 25 | 30 days supply | Qty: 30 | Fill #0

## 2020-10-22 MED FILL — OLANZapine 7.5 MG TABS: 7.5 | 30 days supply | Qty: 30 | Fill #0

## 2020-10-22 MED FILL — PRAZOSIN 1 MG CAPSULE: 1 | 30 days supply | Qty: 30 | Fill #0

## 2020-10-22 MED FILL — hydrOXYzine HCL 50 MG TABS: 50 | 30 days supply | Qty: 60 | Fill #0

## 2020-10-28 MED FILL — OMEPRAZOLE DR 40 MG CAPSULE: 40 | 30 days supply | Qty: 30 | Fill #1

## 2020-10-28 MED FILL — AMLODIPINE BESYLATE 10 MG T: 10 | 30 days supply | Qty: 30 | Fill #4

## 2020-10-28 MED FILL — HYDROCHLOROTHIAZIDE 25 MG T: 25 | 30 days supply | Qty: 30 | Fill #1

## 2020-10-28 MED FILL — LISINOPRIL 20 MG TABLET: 20 | 30 days supply | Qty: 30 | Fill #1

## 2020-11-14 ENCOUNTER — Ambulatory Visit: Payer: Self-pay | Admitting: Internal Medicine

## 2020-11-14 ENCOUNTER — Telehealth: Payer: Self-pay | Admitting: Internal Medicine

## 2020-11-14 MED ORDER — VALACYCLOVIR HCL 500 MG PO TABS
500.0000 mg | ORAL_TABLET | Freq: Every day | ORAL | 0 refills | Status: DC
Start: 2020-11-14 — End: 2020-12-12

## 2020-11-14 NOTE — Telephone Encounter (Signed)
Called pt made aware of RX  

## 2020-11-14 NOTE — Telephone Encounter (Signed)
Pt is calling and she is having HPV flare up and would like dr Wynetta Emery to prescribed valtrex and seen to [facility]

## 2020-11-14 NOTE — Telephone Encounter (Signed)
Will forward to pcp

## 2020-11-14 NOTE — Addendum Note (Signed)
Addended by: Karle Plumber B on: 11/14/2020 05:19 PM   Modules accepted: Orders

## 2020-11-14 NOTE — Telephone Encounter (Signed)
Patient called stating that she need her valtrex RX renewed.  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  She has rash/blisters at corners of her mouth where she states he outbreak usually occurs.  She states that original rx was written by [facility] provider. Per protocol patient was advised that she would meed to go to [facility] for treatment.  She is still requesting Rx place at [place]. Will rout request to office    Reason for Disposition . [1] Looks infected (spreading redness, pus) AND [2] no fever  Answer Assessment - Initial Assessment Questions 1. APPEARANCE of LESION: "What does it look like?"      Rash around corners of mouth 2. SIZE: "How big is it?" (e.g., compare to size of pinhead, tip of pen, eraser, coin, pea, grape, ping pong ball; or size in cms or inches)      blistery 3. COLOR: "What color is it?" "Is there more than one color?"     red 4. SHAPE: "What shape is it?" (e.g., round, irregular)   round 5. RAISED: "Does it stick up above the skin or is it flat?" (e.g., raised or elevated)    raised 6. TENDER: "Does it hurt when you touch it?"  (Scale 1-10; or mild, moderate, severe)    2-3 7. LOCATION: "Where is it located?"    Around mouth 8. ONSET: "When did it first appear?"      2 days ago 9. NUMBER: "Is there just one?" or "Are there others?"    Few  10. CAUSE: "What do you think it is?"     HPV flare 11. OTHER SYMPTOMS: "Do you have any other symptoms?" (e.g., fever)       No 12. PREGNANCY: "Is there any chance you are pregnant?" "When was your last menstrual period?"       No tubal  Protocols used: [place]

## 2020-11-28 ENCOUNTER — Other Ambulatory Visit: Payer: Self-pay

## 2020-11-28 MED FILL — ARIPIPRAZOLE 10 MG TABS: 10 | 30 days supply | Qty: 30 | Fill #0

## 2020-11-28 MED FILL — AMITRIPTYLINE HCL 25 MG TAB: 25 | 30 days supply | Qty: 30 | Fill #0

## 2020-11-28 MED FILL — hydrOXYzine HCL 50 MG TABS: 50 | 30 days supply | Qty: 60 | Fill #0

## 2020-11-28 MED FILL — GABAPENTIN 300 MG CAPSULE: 300 | 30 days supply | Qty: 90 | Fill #0

## 2020-11-28 MED FILL — VENLAFAXINE HCL ER 150 MG C: 150 | 30 days supply | Qty: 60 | Fill #0

## 2020-11-28 MED FILL — ATOMOXETINE HCL 40 MG CAP: 40 | 30 days supply | Qty: 30 | Fill #0

## 2020-11-28 MED FILL — OLANZapine 5 MG TABS: 5 | 30 days supply | Qty: 30 | Fill #0

## 2020-11-28 MED FILL — PRAZOSIN 1 MG CAPSULE: 1 | 30 days supply | Qty: 30 | Fill #0

## 2020-12-12 ENCOUNTER — Other Ambulatory Visit: Payer: Self-pay | Admitting: Internal Medicine

## 2020-12-12 MED ORDER — VALACYCLOVIR HCL 500 MG PO TABS
500.0000 mg | ORAL_TABLET | Freq: Every day | ORAL | 0 refills | Status: DC
Start: 2020-12-12 — End: 2020-12-12

## 2020-12-12 MED FILL — AMLODIPINE BESYLATE 10 MG T: 10 | 30 days supply | Qty: 30 | Fill #5

## 2020-12-12 MED FILL — LISINOPRIL 20 MG TABLET: 20 | 30 days supply | Qty: 30 | Fill #2

## 2020-12-12 MED FILL — HYDROCHLOROTHIAZIDE 25 MG T: 25 | 30 days supply | Qty: 30 | Fill #2

## 2020-12-12 MED FILL — VALACYCLOVIR HCL 500 MG TAB: 500 | 4 days supply | Qty: 4 | Fill #0

## 2020-12-12 NOTE — Telephone Encounter (Signed)
Medication Refill - Medication: valACYclovir (VALTREX) 500 MG tablet     Has the patient contacted their pharmacy? No. (Agent: If no, request that the patient contact the pharmacy for the refill.) (Agent: If yes, when and what did the pharmacy advise?)no refills available   Preferred Pharmacy (with phone number or street name): [place]: Please be advised that RX refills may take up to 3 business days. We ask that you follow-up with your pharmacy.

## 2020-12-25 ENCOUNTER — Other Ambulatory Visit: Payer: Self-pay

## 2020-12-25 MED FILL — OLANZapine 2.5 MG TABS: 2.5 | 15 days supply | Qty: 11 | Fill #0

## 2020-12-25 MED FILL — AMITRIPTYLINE HCL 75 MG TAB: 75 | 30 days supply | Qty: 30 | Fill #0

## 2020-12-25 MED FILL — PRAZOSIN 1 MG CAPSULE: 1 | 30 days supply | Qty: 30 | Fill #0

## 2020-12-25 MED FILL — hydrOXYzine HCL 50 MG TABS: 50 | 30 days supply | Qty: 60 | Fill #0

## 2020-12-25 MED FILL — GABAPENTIN 300 MG CAPSULE: 300 | 30 days supply | Qty: 90 | Fill #0

## 2020-12-25 MED FILL — ARIPIPRAZOLE 10 MG TABS: 10 | 30 days supply | Qty: 30 | Fill #0

## 2021-01-24 MED FILL — ARIPIPRAZOLE 10 MG TABS: 10 | 30 days supply | Qty: 30 | Fill #0

## 2021-01-24 MED FILL — hydrOXYzine HCL 50 MG TABS: 50 | 30 days supply | Qty: 60 | Fill #0

## 2021-01-24 MED FILL — VENLAFAXINE HCL ER 150 MG C: 150 | 30 days supply | Qty: 60 | Fill #0

## 2021-01-24 MED FILL — PRAZOSIN 1 MG CAPSULE: 1 | 30 days supply | Qty: 30 | Fill #0

## 2021-01-24 MED FILL — ATOMOXETINE HCL 60 MG CAPS: 60 | 30 days supply | Qty: 30 | Fill #0

## 2021-01-24 MED FILL — GABAPENTIN 300 MG CAPSULE: 300 | 30 days supply | Qty: 90 | Fill #0

## 2021-01-24 MED FILL — AMITRIPTYLINE HCL 75 MG TAB: 75 | 30 days supply | Qty: 30 | Fill #0

## 2021-01-30 MED FILL — AMLODIPINE BESYLATE 10 MG T: 10 | 30 days supply | Qty: 30 | Fill #5

## 2021-01-30 MED FILL — HYDROCHLOROTHIAZIDE 25 MG T: 25 | 30 days supply | Qty: 30 | Fill #2

## 2021-01-30 MED FILL — LISINOPRIL 20 MG TABLET: 20 | 30 days supply | Qty: 30 | Fill #2

## 2021-02-17 ENCOUNTER — Other Ambulatory Visit: Payer: Self-pay

## 2021-02-17 MED FILL — ARIPiprazole 5 MG TABS: 5 | 30 days supply | Qty: 30 | Fill #0

## 2021-02-17 MED FILL — AMLODIPINE BESYLATE 10 MG T: 10 | 30 days supply | Qty: 30 | Fill #5

## 2021-02-17 MED FILL — OMEPRAZOLE DR 40 MG CAPSULE: 40 | 30 days supply | Qty: 30 | Fill #2

## 2021-02-17 MED FILL — LISINOPRIL 20 MG TABLET: 20 | 30 days supply | Qty: 30 | Fill #2

## 2021-02-17 MED FILL — PRAZOSIN 1 MG CAPSULE: 1 | 30 days supply | Qty: 30 | Fill #0

## 2021-02-17 MED FILL — HYDROCHLOROTHIAZIDE 25 MG T: 25 | 30 days supply | Qty: 30 | Fill #2

## 2021-02-22 ENCOUNTER — Other Ambulatory Visit: Payer: Self-pay

## 2021-02-25 ENCOUNTER — Other Ambulatory Visit: Payer: Self-pay

## 2021-02-25 MED ORDER — ARIPIPRAZOLE 10 MG PO TABS
ORAL_TABLET | ORAL | 0 refills | Status: DC
  Filled 2021-02-25: qty 30, 30d supply, fill #0

## 2021-02-25 MED ORDER — PRAZOSIN HCL 2 MG PO CAPS
2.0000 mg | ORAL_CAPSULE | Freq: Every day | ORAL | 0 refills | Status: DC
  Filled 2021-02-25: qty 30, 30d supply, fill #0

## 2021-02-25 MED ORDER — AMITRIPTYLINE HCL 50 MG PO TABS
ORAL_TABLET | ORAL | 0 refills | Status: DC
  Filled 2021-02-25: qty 30, 30d supply, fill #0

## 2021-02-27 ENCOUNTER — Other Ambulatory Visit: Payer: Self-pay

## 2021-03-05 ENCOUNTER — Other Ambulatory Visit: Payer: Self-pay

## 2021-03-10 ENCOUNTER — Other Ambulatory Visit: Payer: Self-pay

## 2021-03-25 ENCOUNTER — Other Ambulatory Visit: Payer: Self-pay

## 2021-03-25 MED ORDER — AMITRIPTYLINE HCL 50 MG PO TABS
ORAL_TABLET | ORAL | 0 refills | Status: DC
  Filled 2021-03-25: qty 30, 30d supply, fill #0

## 2021-03-25 MED ORDER — PRAZOSIN HCL 1 MG PO CAPS
ORAL_CAPSULE | ORAL | 0 refills | Status: DC
  Filled 2021-03-25: qty 90, 30d supply, fill #0

## 2021-03-25 MED ORDER — HYDROXYZINE HCL 50 MG PO TABS
50.0000 mg | ORAL_TABLET | Freq: Every day | ORAL | 0 refills | Status: DC
  Filled 2021-03-25: qty 30, 30d supply, fill #0

## 2021-03-25 MED ORDER — ARIPIPRAZOLE 10 MG PO TABS
ORAL_TABLET | ORAL | 0 refills | Status: DC
  Filled 2021-03-25: qty 30, 30d supply, fill #0

## 2021-03-25 MED ORDER — BUSPIRONE HCL 5 MG PO TABS
ORAL_TABLET | ORAL | 0 refills | Status: DC
  Filled 2021-03-25: qty 90, 30d supply, fill #0

## 2021-03-27 ENCOUNTER — Other Ambulatory Visit: Payer: Self-pay

## 2021-03-31 ENCOUNTER — Other Ambulatory Visit: Payer: Self-pay

## 2021-04-04 ENCOUNTER — Ambulatory Visit: Payer: Self-pay | Admitting: *Deleted

## 2021-04-04 NOTE — Telephone Encounter (Signed)
Pt called stating her BP was170/120 on 0800 on 04/04/21 ; the reading was taken on her left upper arm with home cuff; the pt says she has been having headaches x 1 month on the right side of her head and behind her eyes rated 6-7 out of 10, intermittent, and currently present; she has taken  taken tylenol and ibuprofen; the pt is also having ear pain described as throbbing, and she can hear heart beating and earchache pain; the pt says she  blacked out on 04/02/21 for 10 mins; her boyfriend says she was takling gibberish when she awakened; EMS was not called but she went to bed; recommendations made per nurse triage protocol; she verbalized understanding and will go to the ED; the pt sees Dr Rosana Berger, [facility]; will route to office for notification. Reason for Disposition . [2] Systolic BP  >= 505 OR Diastolic >= 397 AND [6] cardiac or neurologic symptoms (e.g., chest pain, difficulty breathing, unsteady gait, blurred vision)  Answer Assessment - Initial Assessment Questions 1. BLOOD PRESSURE: "What is the blood pressure?" "Did you take at least two measurements 5 minutes apart?"     170/120 2. ONSET: "When did you take your blood pressure?"     03/25/21 3. HOW: "How did you obtain the blood pressure?" (e.g., visiting nurse, automatic home BP monitor)   Home cuff 4. HISTORY: "Do you have a history of high blood pressure?"     yes 5. MEDICATIONS: "Are you taking any medications for blood pressure?" "Have you missed any doses recently?"     no 6. OTHER SYMPTOMS: "Do you have any symptoms?" (e.g., headache, chest pain, blurred vision, difficulty breathing, weakness)     Blurred visiion in the past 2 weeks; headaches x 1 month, earache, blacked out x 10 min on 04/02/21 7. PREGNANCY: "Is there any chance you are pregnant?" "When was your last menstrual period?"     No BTL,  LMP late 02/2021  Protocols used: BLOOD PRESSURE - HIGH-A-AH

## 2021-04-04 NOTE — Telephone Encounter (Signed)
FYI    Contacted pt and left a detailed vm making pt aware that because of the symptoms she is experiencing and because of her blood pressure she will need to be seen at the ED or the Urgent Care and if she has any questions or concerns to give [place] a call

## 2021-04-07 ENCOUNTER — Other Ambulatory Visit: Payer: Self-pay

## 2021-04-07 MED ORDER — AZITHROMYCIN 250 MG PO TABS
ORAL_TABLET | ORAL | 0 refills | Status: DC
  Filled 2021-04-07 – 2021-04-28 (×2): qty 6, 5d supply, fill #0

## 2021-04-10 ENCOUNTER — Other Ambulatory Visit: Payer: Self-pay

## 2021-04-10 ENCOUNTER — Telehealth: Payer: Self-pay | Admitting: Internal Medicine

## 2021-04-10 ENCOUNTER — Other Ambulatory Visit: Payer: Self-pay | Admitting: Physician Assistant

## 2021-04-10 DIAGNOSIS — I10 Essential (primary) hypertension: Secondary | ICD-10-CM

## 2021-04-10 DIAGNOSIS — R12 Heartburn: Secondary | ICD-10-CM

## 2021-04-10 MED ORDER — LISINOPRIL 20 MG PO TABS
20.0000 mg | ORAL_TABLET | Freq: Every day | ORAL | 0 refills | Status: DC
  Filled 2021-04-10: qty 30, 30d supply, fill #0
  Filled 2021-04-28: qty 90, 90d supply, fill #0

## 2021-04-10 MED ORDER — OMEPRAZOLE 40 MG PO CPDR
DELAYED_RELEASE_CAPSULE | Freq: Every day | ORAL | 0 refills | Status: DC
Start: 2021-04-10 — End: 2021-06-12
  Filled 2021-04-10: qty 30, fill #0
  Filled 2021-04-15 – 2021-04-28 (×2): qty 30, 30d supply, fill #0

## 2021-04-10 MED ORDER — AMITRIPTYLINE HCL 50 MG PO TABS
ORAL_TABLET | ORAL | 0 refills | Status: DC
  Filled 2021-04-10: qty 30, fill #0

## 2021-04-10 MED ORDER — HYDROCHLOROTHIAZIDE 25 MG PO TABS
25.0000 mg | ORAL_TABLET | Freq: Every day | ORAL | 0 refills | Status: DC
  Filled 2021-04-10: qty 30, 30d supply, fill #0
  Filled 2021-04-28: qty 90, 90d supply, fill #0

## 2021-04-10 MED FILL — Amlodipine Besylate Tab 10 MG (Base Equivalent): ORAL | 30 days supply | Qty: 30 | Fill #0 | Status: CN

## 2021-04-10 MED FILL — Omeprazole Cap Delayed Release 40 MG: ORAL | 30 days supply | Qty: 30 | Fill #0 | Status: CN

## 2021-04-10 NOTE — Telephone Encounter (Signed)
[name] would you be able to refill medications

## 2021-04-10 NOTE — Telephone Encounter (Signed)
Lisinopril and HCTZ was already sent by someone in the call center. I will send one month of omeprazole and amitriptyline.

## 2021-04-10 NOTE — Telephone Encounter (Signed)
Pt would like a refill on these medications   omeprazole (PRILOSEC) 40 MG capsule   hydrochlorothiazide (HYDRODIURIL) 25 MG tablet   lisinopril (ZESTRIL) 20 MG tablet   amitriptyline (ELAVIL) 50 MG tablet     Please follow up with pt regarding issue.

## 2021-04-11 ENCOUNTER — Other Ambulatory Visit: Payer: Self-pay

## 2021-04-14 ENCOUNTER — Other Ambulatory Visit: Payer: Self-pay

## 2021-04-15 ENCOUNTER — Other Ambulatory Visit: Payer: Self-pay

## 2021-04-17 ENCOUNTER — Other Ambulatory Visit: Payer: Self-pay

## 2021-04-22 ENCOUNTER — Other Ambulatory Visit: Payer: Self-pay

## 2021-04-28 ENCOUNTER — Other Ambulatory Visit: Payer: Self-pay

## 2021-04-28 MED FILL — Amlodipine Besylate Tab 10 MG (Base Equivalent): ORAL | 30 days supply | Qty: 30 | Fill #0 | Status: AC

## 2021-05-15 ENCOUNTER — Ambulatory Visit: Payer: Medicaid Other | Admitting: Internal Medicine

## 2021-05-20 ENCOUNTER — Other Ambulatory Visit: Payer: Self-pay

## 2021-05-20 ENCOUNTER — Ambulatory Visit (HOSPITAL_COMMUNITY)
Admission: EM | Admit: 2021-05-20 | Discharge: 2021-05-20 | Disposition: A | Discharge status: Home / Self Care | Payer: Self-pay | Attending: Family Medicine | Admitting: Family Medicine

## 2021-05-20 ENCOUNTER — Encounter (HOSPITAL_COMMUNITY): Payer: Self-pay | Admitting: Emergency Medicine

## 2021-05-20 DIAGNOSIS — R300 Dysuria: Secondary | ICD-10-CM

## 2021-05-20 DIAGNOSIS — N898 Other specified noninflammatory disorders of vagina: Secondary | ICD-10-CM

## 2021-05-20 LAB — POCT URINALYSIS DIPSTICK, ED / UC
Bilirubin Urine: NEGATIVE
Glucose, UA: NEGATIVE mg/dL
Ketones, ur: NEGATIVE mg/dL
Leukocytes,Ua: NEGATIVE
Nitrite: NEGATIVE
Protein, ur: NEGATIVE mg/dL
Specific Gravity, Urine: 1.01 (ref 1.005–1.030)
Urobilinogen, UA: 0.2 mg/dL (ref 0.0–1.0)
pH: 7 (ref 5.0–8.0)

## 2021-05-20 MED ORDER — CEPHALEXIN 500 MG PO CAPS
500.0000 mg | ORAL_CAPSULE | Freq: Two times a day (BID) | ORAL | 0 refills | Status: DC
Start: 2021-05-20 — End: 2021-05-21
  Filled 2021-05-20: qty 10, 5d supply, fill #0

## 2021-05-20 NOTE — Discharge Instructions (Addendum)
In addition to a urine culture, we have sent testing for sexually transmitted infections. We will notify you of any positive results once they are received. If required, we will prescribe any medications you might need.  Please refrain from all sexual activity for at least the next seven days.

## 2021-05-20 NOTE — ED Triage Notes (Signed)
Pt is present today with dysuria, vaginal odor, lower back pain, nausea, and cloudy urine. Pt states that her sx started  month ago.
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
## 2021-05-21 ENCOUNTER — Other Ambulatory Visit: Payer: Self-pay

## 2021-05-21 ENCOUNTER — Encounter (HOSPITAL_COMMUNITY): Payer: Self-pay

## 2021-05-21 ENCOUNTER — Telehealth (HOSPITAL_COMMUNITY): Payer: Self-pay | Admitting: Emergency Medicine

## 2021-05-21 ENCOUNTER — Ambulatory Visit (HOSPITAL_COMMUNITY)
Admission: EM | Admit: 2021-05-21 | Discharge: 2021-05-21 | Disposition: A | Discharge status: Home / Self Care | Payer: Self-pay | Attending: Medical Oncology | Admitting: Medical Oncology

## 2021-05-21 DIAGNOSIS — R110 Nausea: Secondary | ICD-10-CM

## 2021-05-21 DIAGNOSIS — R0602 Shortness of breath: Secondary | ICD-10-CM

## 2021-05-21 LAB — CERVICOVAGINAL ANCILLARY ONLY
Bacterial Vaginitis (gardnerella): POSITIVE — AB
Candida Glabrata: NEGATIVE
Candida Vaginitis: POSITIVE — AB
Chlamydia: NEGATIVE
Comment: NEGATIVE
Comment: NEGATIVE
Comment: NEGATIVE
Comment: NEGATIVE
Comment: NEGATIVE
Comment: NORMAL
Neisseria Gonorrhea: NEGATIVE
Trichomonas: NEGATIVE

## 2021-05-21 MED ORDER — METRONIDAZOLE 500 MG PO TABS
500.0000 mg | ORAL_TABLET | Freq: Two times a day (BID) | ORAL | 0 refills | Status: DC
  Filled 2021-05-21 – 2021-05-29 (×2): qty 14, 7d supply, fill #0

## 2021-05-21 MED ORDER — FLUCONAZOLE 150 MG PO TABS
150.0000 mg | ORAL_TABLET | Freq: Once | ORAL | 0 refills | Status: AC
  Filled 2021-05-21: qty 2, 3d supply, fill #0
  Filled 2021-05-29: qty 2, 2d supply, fill #0

## 2021-05-21 MED ORDER — NITROFURANTOIN MONOHYD MACRO 100 MG PO CAPS
100.0000 mg | ORAL_CAPSULE | Freq: Two times a day (BID) | ORAL | 0 refills | Status: DC
  Filled 2021-05-21 – 2021-05-29 (×2): qty 10, 5d supply, fill #0

## 2021-05-21 NOTE — ED Triage Notes (Signed)
Pt reports nausea, dry mouth and shortness of breath. States symptoms started couples hrs after taking cephalexin last night. Pt reports she was here yesterday for UTI. Denies difficulty swallowing, talking; throat tightness;  swelling lips.

## 2021-05-21 NOTE — ED Provider Notes (Signed)
[place]    CSN: [id number] Arrival date & time: 05/21/21  1540      History   Chief Complaint Chief Complaint  Patient presents with   Nausea   Shortness of Breath    HPI [name] is a 45 y.o. female.   HPI  Nausea: Patient was seen yesterday for dysuria and suspected urinary tract infection.  She was started on Keflex 500 mg twice daily x 5 days.  She reports that after taking the Keflex medication she developed nausea, dry mouth and some shortness of breath sensation. No rash, itching, trouble breathing, vomiting or swallowing. She reports that this has made her anxious. She has not taken anything for her new symptoms.    Past Medical History:  Diagnosis Date   Abnormal Pap smear 1998   had colpo   Anxiety    Asthma    ,as child only   no inhaler   Bicornate uterus    Bicornuate uterus    Chlamydia 2000   GERD (gastroesophageal reflux disease)    HCV infection    HX of Hep C   Headache(784.0)    Hypertension 2010   IUP (intrauterine pregnancy), incidental 13 weeks   Opiate dependence ([place])    stopped methadone 01/2011   PONV (postoperative nausea and vomiting)    STD (female)    HX of STD's    Patient Active Problem List   Diagnosis Date Noted   Substance induced mood disorder ([place]) 07/15/2020   Pica 05/21/2020   Prediabetes 05/21/2020   Alcohol use disorder, severe, dependence ([place]) 05/21/2020   Diarrhea 03/24/2020   Hypokalemia 03/24/2020   Iron deficiency anemia 03/24/2020   MDD (major depressive disorder), recurrent, severe, 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[place])    Severe recurrent major depression without psychotic features ([place]) 03/22/2020   Adjustment disorder with mixed anxiety and depressed mood 01/10/2020   Polysubstance abuse ([place]) 01/10/2020   Gastroesophageal reflux disease 03/08/2017   Anxiety 03/08/2017   Anxiety and depression 10/10/2015   History of intravenous drug use in remission  09/07/2011   HCV infection    Essential hypertension    Bipolar disorder ([place])    Bicornuate uterus     Past Surgical History:  Procedure Laterality Date   BILATERAL SALPINGECTOMY  12/06/2012   Procedure: BILATERAL SALPINGECTOMY;  Surgeon: Mora Bellman, MD;  Location: [facility];  Service: Gynecology;  Laterality: Bilateral;  laparoscopic   CERVICAL CERCLAGE  09/01/2011   Procedure: CERCLAGE CERVICAL;  Surgeon: Donnamae Jude, MD;  Location: [facility];  Service: Gynecology;  Laterality: N/A;   CERVICAL CERCLAGE  06/28/2012   Procedure: CERCLAGE CERVICAL;  Surgeon: Donnamae Jude, MD;  Location: [facility];  Service: Gynecology;  Laterality: N/A;   DILATION AND CURETTAGE OF UTERUS     SAB   FINGER SURGERY  2003   reattachment of right forefinger   THERAPEUTIC ABORTION     THERAPEUTIC ABORTION     TONSILLECTOMY     TUBAL LIGATION  11/23/2012    OB History     Gravida  7   Para  4   Term  2   Preterm  2   AB  3   Living  2      SAB  1   IAB  2   Ectopic  0   Multiple  0   Live Births  3            Home Medications  Prior to Admission medications   Medication Sig Start Date End Date Taking? Authorizing Provider  amitriptyline (ELAVIL) 50 MG tablet take 1/2 tablet by mouth for 1 week. Then take 1 tablet by mouth every night at bedtime 04/10/21   Ladell Pier, MD  amLODipine (NORVASC) 10 MG tablet TAKE 1 TABLET (10 MG TOTAL) BY MOUTH DAILY. 05/21/20 05/28/21  Ladell Pier, MD  ARIPiprazole (ABILIFY) 10 MG tablet take 1 tablet by oral route  every day 03/25/21     atomoxetine (STRATTERA) 60 MG capsule TAKE 1 CAPSULE BY ORAL ROUTE EVERY DAY IN THE MORNING 12/25/20 12/25/21    busPIRone (BUSPAR) 5 MG tablet take 1/2 tablet by mouth 3 times every day for 1 week. Then take 1 tablet 3 times each day. 03/25/21     cephALEXin (KEFLEX) 500 MG capsule Take 1 capsule (500 mg total) by mouth 2 (two) times daily. 05/20/21   Vanessa Kick, MD  ferrous sulfate 325 (65 FE) MG tablet Take  1 tablet (325 mg total) by mouth daily with breakfast. 06/05/20   Charlott Rakes, MD  folic acid (FOLVITE) 1 MG tablet Take 1 tablet (1 mg total) by mouth daily. 07/23/20   Sharma Covert, MD  gabapentin (NEURONTIN) 300 MG capsule TAKE 1 CAPSULE BY ORAL ROUTE 3 TIMES EVERY DAY 12/25/20 12/25/21    hydrochlorothiazide (HYDRODIURIL) 25 MG tablet Take 1 tablet (25 mg total) by mouth daily. OFFICE VISIT NEEDED FOR ADDITIONAL REFILLS 04/10/21 04/10/22  Ladell Pier, MD  hydrOXYzine (ATARAX/VISTARIL) 50 MG tablet take 1 tablet by mouth at bedtime. 03/25/21     lisinopril (ZESTRIL) 20 MG tablet Take 1 tablet (20 mg total) by mouth daily. OFFICE VISIT NEEDED FOR ADDITIONAL REFILLS 04/10/21   Ladell Pier, MD  OLANZapine (ZYPREXA) 5 MG tablet TAKE 1 TABLET BY MOUTH EVERY DAY 11/28/20 11/28/21    omeprazole (PRILOSEC) 40 MG capsule TAKE 1 CAPSULE (40 MG TOTAL) BY MOUTH DAILY. 04/10/21 04/10/22  Ladell Pier, MD  prazosin (MINIPRESS) 1 MG capsule take 3 capsule by mouth every night at bedtime. 03/25/21     senna-docusate (SENOKOT-S) 8.6-50 MG tablet Take 1 tablet by mouth daily as needed for mild constipation. 06/05/20   Charlott Rakes, MD  thiamine 100 MG tablet Take 1 tablet (100 mg total) by mouth daily. 07/23/20   Sharma Covert, MD  valACYclovir (VALTREX) 500 MG tablet TAKE 1 TABLET (500 MG TOTAL) BY MOUTH DAILY. 12/12/20 12/12/21  Ladell Pier, MD  venlafaxine XR (EFFEXOR-XR) 150 MG 24 hr capsule TAKE 2 CAPSULE BY ORAL ROUTE EVERY DAY 12/25/20 12/25/21    losartan-hydrochlorothiazide (HYZAAR) 100-25 MG tablet Take 1 tablet by mouth daily. 06/10/19 01/02/20  Vanessa Kick, MD  pantoprazole (PROTONIX) 40 MG tablet Take 1 tablet (40 mg total) by mouth daily. 08/15/20 09/05/20  Charlott Rakes, MD    Family History Family History  Problem Relation Age of Onset   Hypertension Mother    Mental illness Mother        depression; alot of mental illness.   Cancer Father 54       Colon cancer age 7    Thyroid disease Maternal Grandmother    Diabetes Paternal Grandfather    Anesthesia problems Neg Hx    Other Neg Hx     Social History Social History   Tobacco Use   Smoking status: Every Day    Packs/day: 0.50    Years: 20.00    Pack years: 10.00  Types: Cigarettes    Start date: 05/04/2014   Smokeless tobacco: Never   Tobacco comments:    using the vapor only  Vaping Use   Vaping Use: Some days   Substances: Nicotine  Substance Use Topics   Alcohol use: Yes    Comment: at least 6 shots per day most days; Last drink last  Tuesday 07/09/20   Drug use: Not Currently    Types: Cocaine, IV, Marijuana, Heroin, Methamphetamines, Other-see comments    Comment: prescription pain meds     Allergies   Hydrocodone-acetaminophen   Review of Systems Review of Systems  As stated above in HPI Physical Exam Triage Vital Signs ED Triage Vitals  Enc Vitals Group     BP 05/21/21 0853 (!) 165/108     Pulse Rate 05/21/21 0853 90     Resp 05/21/21 0853 20     Temp 05/21/21 0853 (!) 97.5 F (36.4 C)     Temp Source 05/21/21 0853 Oral     SpO2 05/21/21 0853 99 %     Weight --      Height --      Head Circumference --      Peak Flow --      Pain Score 05/21/21 0851 0     Pain Loc --      Pain Edu? --      Excl. in [place]? --    No data found.  Updated Vital Signs BP (!) 165/108 (BP Location: Right Arm) Comment: States she has not taken her meds today.  Pulse 90   Temp (!) 97.5 F (36.4 C) (Oral)   Resp 20   LMP  (Within Weeks) Comment: 2 weeks  SpO2 99%   Physical Exam Vitals and nursing note reviewed.  Constitutional:      General: She is not in acute distress.    Appearance: She is well-developed. She is not ill-appearing, toxic-appearing or diaphoretic.  HENT:     Head: Normocephalic and atraumatic.     Mouth/Throat:     Pharynx: Oropharynx is clear. No pharyngeal swelling.  Eyes:     Extraocular Movements: Extraocular movements intact.     Pupils: Pupils are  equal, round, and reactive to light.  Cardiovascular:     Rate and Rhythm: Normal rate and regular rhythm.  Pulmonary:     Effort: Pulmonary effort is normal.     Breath sounds: Normal breath sounds. No stridor. No decreased breath sounds, wheezing, rhonchi or rales.  Skin:    General: Skin is warm.     Coloration: Skin is not cyanotic or pale.     Findings: No rash.  Neurological:     Mental Status: She is alert.     UC Treatments / Results  Labs (all labs ordered are listed, but only abnormal results are displayed) Labs Reviewed - No data to display  EKG   Radiology No results found.  Procedures Procedures (including critical care time)  Medications Ordered in UC Medications - No data to display  Initial Impression / Assessment and Plan / UC Course  I have reviewed the triage vital signs and the nursing notes.  Pertinent labs & imaging results that were available during my care of the patient were reviewed by me and considered in my medical decision making (see chart for details).     New.  Likely an intolerance to the Keflex medication.  No sign of anaphylaxis.  I have added this to her allergy list and have  discussed her holding this medication and switching to Macrobid instead.  She will not take any more additional doses of the Keflex medication.  She may benefit from going home and taking a Benadryl and we discussed red flag signs and symptoms.  If symptoms do not improve I would recommend follow-up.    Final Clinical Impressions(s) / UC Diagnoses   Final diagnoses:  None   Discharge Instructions   None    ED Prescriptions   None    PDMP not reviewed this encounter.   Hughie Closs, [place] 05/21/21 [phone]

## 2021-05-28 ENCOUNTER — Other Ambulatory Visit: Payer: Self-pay

## 2021-05-29 ENCOUNTER — Other Ambulatory Visit: Payer: Self-pay

## 2021-06-02 ENCOUNTER — Other Ambulatory Visit: Payer: Self-pay

## 2021-06-02 MED ORDER — ARIPIPRAZOLE 5 MG PO TABS
ORAL_TABLET | ORAL | 0 refills | Status: DC
  Filled 2021-06-02: qty 30, 34d supply, fill #0

## 2021-06-02 MED ORDER — PRAZOSIN HCL 1 MG PO CAPS
1.0000 mg | ORAL_CAPSULE | Freq: Every day | ORAL | 0 refills | Status: DC
  Filled 2021-06-02: qty 30, 30d supply, fill #0

## 2021-06-02 MED ORDER — VENLAFAXINE HCL ER 37.5 MG PO CP24
ORAL_CAPSULE | ORAL | 0 refills | Status: DC
  Filled 2021-06-02: qty 30, 30d supply, fill #0

## 2021-06-05 ENCOUNTER — Other Ambulatory Visit: Payer: Self-pay

## 2021-06-12 ENCOUNTER — Encounter: Payer: Self-pay | Admitting: Physician Assistant

## 2021-06-12 ENCOUNTER — Other Ambulatory Visit: Payer: Self-pay

## 2021-06-12 ENCOUNTER — Ambulatory Visit: Payer: Self-pay | Attending: Internal Medicine | Admitting: Physician Assistant

## 2021-06-12 VITALS — BP 128/92 | HR 96 | Resp 16 | Wt 168.2 lb

## 2021-06-12 DIAGNOSIS — R12 Heartburn: Secondary | ICD-10-CM

## 2021-06-12 DIAGNOSIS — E871 Hypo-osmolality and hyponatremia: Secondary | ICD-10-CM

## 2021-06-12 DIAGNOSIS — D508 Other iron deficiency anemias: Secondary | ICD-10-CM

## 2021-06-12 DIAGNOSIS — Z09 Encounter for follow-up examination after completed treatment for conditions other than malignant neoplasm: Secondary | ICD-10-CM

## 2021-06-12 DIAGNOSIS — I10 Essential (primary) hypertension: Secondary | ICD-10-CM

## 2021-06-12 DIAGNOSIS — R739 Hyperglycemia, unspecified: Secondary | ICD-10-CM

## 2021-06-12 MED ORDER — LISINOPRIL 20 MG PO TABS
20.0000 mg | ORAL_TABLET | Freq: Every day | ORAL | 1 refills | Status: DC
  Filled 2021-06-12 – 2022-01-29 (×3): qty 90, 90d supply, fill #0

## 2021-06-12 MED ORDER — AMLODIPINE BESYLATE 10 MG PO TABS
ORAL_TABLET | Freq: Every day | ORAL | 1 refills | Status: DC
  Filled 2021-06-12: qty 30, 30d supply, fill #0
  Filled 2021-06-23 – 2022-01-01 (×3): qty 90, 90d supply, fill #0
  Filled 2022-01-01: qty 90, 90d supply, fill #1
  Filled 2022-01-29: qty 90, 90d supply, fill #0

## 2021-06-12 MED ORDER — HYDROCHLOROTHIAZIDE 25 MG PO TABS
25.0000 mg | ORAL_TABLET | Freq: Every day | ORAL | 1 refills | Status: DC
  Filled 2021-06-12 – 2022-01-29 (×3): qty 90, 90d supply, fill #0

## 2021-06-12 MED ORDER — OMEPRAZOLE 40 MG PO CPDR
DELAYED_RELEASE_CAPSULE | Freq: Every day | ORAL | 1 refills | Status: DC
  Filled 2021-06-12: qty 30, 30d supply, fill #0
  Filled 2021-06-23 – 2021-07-21 (×2): qty 90, 90d supply, fill #0
  Filled 2021-11-19: qty 90, 90d supply, fill #1

## 2021-06-12 NOTE — Progress Notes (Signed)
Patient ID: [name], female   DOB: [birth year], 45 y.o.   MRN: [id number]     [name], is a 45 y.o. 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  That has resolved.  No further UTI/BV s/sx.  Finished antibiotics.    She needs RF of BP meds.  Took all but lisinopril today.  Denies any issues or concerns.  Does not want to have labs done today but is fasting and I encouraged her that we should go ahead and do them.    From ED A/P: New.  Likely an intolerance to the Keflex medication.  No sign of anaphylaxis.  I have added this to her allergy list and have discussed her holding this medication and switching to Macrobid instead.  She will not take any more additional doses of the Keflex medication.  She may benefit from going home and taking a Benadryl and we discussed red flag signs and symptoms.  If symptoms do not improve I would recommend follow-up.   ED/Hospital notes reviewed.    ROS:   Constitutional:  No f/c, No night sweats, No unexplained weight loss. EENT:  No vision changes, No blurry vision, No hearing changes. No mouth, throat, or ear problems.  Respiratory: No cough, No SOB Cardiac: No CP, no palpitations GI:  No abd pain, No N/V/D. GU: No Urinary s/sx Musculoskeletal: No joint pain Neuro: No headache, no dizziness, no motor weakness.  Skin: No rash Endocrine:  No polydipsia. No polyuria.  Psych: Denies SI/HI  No problems updated.  ALLERGIES: Allergies  Allergen Reactions   Keflex [Cephalexin] Shortness Of Breath    Dry mouth and nausea   Hydrocodone-Acetaminophen Nausea Only    PAST MEDICAL HISTORY: Past Medical History:  Diagnosis Date   Abnormal Pap smear 1998   had colpo   Anxiety    Asthma    ,as child only   no inhaler    Bicornate uterus    Bicornuate uterus    Chlamydia 2000   GERD (gastroesophageal reflux disease)    HCV infection    HX of Hep C   Headache(784.0)    Hypertension 2010   IUP (intrauterine pregnancy), incidental 13 weeks   Opiate dependence ([place])    stopped methadone 01/2011   PONV (postoperative nausea and vomiting)    STD (female)    HX of STD's    MEDICATIONS AT HOME: Prior to Admission medications   Medication Sig Start Date End Date Taking? Authorizing Provider  ARIPiprazole (ABILIFY) 10 MG tablet take 1 tablet by oral route  every day 03/25/21  Yes   prazosin (MINIPRESS) 1 MG capsule take 1 capsule by mouth every night at bedtime. 06/02/21  Yes   venlafaxine XR (EFFEXOR-XR) 37.5 MG 24 hr capsule take 1 capsule by mouth  every day with food 06/02/21  Yes   amitriptyline (ELAVIL) 50 MG tablet take 1/2 tablet by mouth for 1 week. Then take 1 tablet by mouth every night at bedtime 04/10/21   Ladell Pier, MD  amLODipine (NORVASC) 10 MG tablet TAKE 1 TABLET (10 MG TOTAL) BY MOUTH DAILY. 06/12/21 06/12/22  Argentina Donovan, PA-C  ARIPiprazole (ABILIFY) 5 MG tablet Take 1/2 tablet by mouth every day for 1 week. Then take 1 tablet by  mouth every day 06/02/21     atomoxetine (STRATTERA) 60 MG capsule TAKE 1 CAPSULE BY ORAL ROUTE EVERY DAY IN THE MORNING 12/25/20 12/25/21    busPIRone (BUSPAR) 5 MG tablet take 1/2 tablet by mouth 3 times every day for 1 week. Then take 1 tablet 3 times each day. 03/25/21     ferrous sulfate 325 (65 FE) MG tablet Take 1 tablet (325 mg total) by mouth daily with breakfast. 06/05/20   Charlott Rakes, MD  folic acid (FOLVITE) 1 MG tablet Take 1 tablet (1 mg total) by mouth daily. 07/23/20   Sharma Covert, MD  gabapentin (NEURONTIN) 300 MG capsule TAKE 1 CAPSULE BY ORAL ROUTE 3 TIMES EVERY DAY 12/25/20 12/25/21    hydrochlorothiazide (HYDRODIURIL) 25 MG tablet Take 1 tablet (25 mg total) by mouth daily. 06/12/21 06/12/22  Argentina Donovan, PA-C  hydrOXYzine  (ATARAX/VISTARIL) 50 MG tablet take 1 tablet by mouth at bedtime. 03/25/21     lisinopril (ZESTRIL) 20 MG tablet Take 1 tablet (20 mg total) by mouth daily. 06/12/21   Argentina Donovan, PA-C  metroNIDAZOLE (FLAGYL) 500 MG tablet Take 1 tablet (500 mg total) by mouth 2 (two) times daily. 05/21/21   Chase Picket, MD  OLANZapine (ZYPREXA) 5 MG tablet TAKE 1 TABLET BY MOUTH EVERY DAY 11/28/20 11/28/21    omeprazole (PRILOSEC) 40 MG capsule TAKE 1 CAPSULE (40 MG TOTAL) BY MOUTH DAILY. 06/12/21 06/12/22  Argentina Donovan, PA-C  senna-docusate (SENOKOT-S) 8.6-50 MG tablet Take 1 tablet by mouth daily as needed for mild constipation. 06/05/20   Charlott Rakes, MD  thiamine 100 MG tablet Take 1 tablet (100 mg total) by mouth daily. 07/23/20   Sharma Covert, MD  valACYclovir (VALTREX) 500 MG tablet TAKE 1 TABLET (500 MG TOTAL) BY MOUTH DAILY. 12/12/20 12/12/21  Ladell Pier, MD  losartan-hydrochlorothiazide (HYZAAR) 100-25 MG tablet Take 1 tablet by mouth daily. 06/10/19 01/02/20  Vanessa Kick, MD  pantoprazole (PROTONIX) 40 MG tablet Take 1 tablet (40 mg total) by mouth daily. 08/15/20 09/05/20  Charlott Rakes, MD     Objective:  EXAM:   Vitals:   06/12/21 0856  BP: (!) 128/92  Pulse: 96  Resp: 16  SpO2: 97%  Weight: 168 lb 3.2 oz (76.3 kg)    General appearance : A&OX3. NAD. Non-toxic-appearing HEENT: Atraumatic and Normocephalic.  PERRLA. EOM intact.  Chest/Lungs:  Breathing-non-labored, Good air entry bilaterally, breath sounds normal without rales, rhonchi, or wheezing  CVS: S1 S2 regular, no murmurs, gallops, rubs  Extremities: Bilateral Lower Ext shows no edema, both legs are warm to touch with = pulse throughout Neurology:  CN II-XII grossly intact, Non focal.   Psych:  TP linear. J/I fair. Normal speech. Appropriate eye contact and flat affect.  Skin:  No Rash  Data Review Lab Results  Component Value Date   HGBA1C 5.4 07/17/2020   HGBA1C 6.1 (H) 03/23/2020   HGBA1C 5.7 (H)  09/19/2014     Assessment & Plan   1. Essential hypertension Take all meds as directed.  DASH diet - amLODipine (NORVASC) 10 MG tablet; TAKE 1 TABLET (10 MG TOTAL) BY MOUTH DAILY.  Dispense: 90 tablet; Refill: 1 - hydrochlorothiazide (HYDRODIURIL) 25 MG tablet; Take 1 tablet (25 mg total) by mouth daily.  Dispense: 90 tablet; Refill: 1 - lisinopril (ZESTRIL) 20 MG tablet; Take 1 tablet (20 mg total) by mouth daily.  Dispense: 90 tablet; Refill: 1  2. Heartburn - omeprazole (PRILOSEC) 40 MG capsule;  TAKE 1 CAPSULE (40 MG TOTAL) BY MOUTH DAILY.  Dispense: 90 capsule; Refill: 1  3. Hyperglycemia I have had a lengthy discussion and provided education about insulin resistance and the intake of too much sugar/refined carbohydrates.  I have advised the patient to work at a goal of eliminating sugary drinks, candy, desserts, sweets, refined sugars, processed foods, and white carbohydrates.  The patient expresses understanding.  - Hemoglobin A1c - Lipid panel  4. Hyponatremia - Comprehensive metabolic panel  5. Other iron deficiency anemia Not taking iron currently - CBC with Differential/Platelet - Iron, TIBC and Ferritin Panel  6. Encounter for examination following treatment at hospital Doing well since UC visit   Patient have been counseled extensively about nutrition and exercise  Return for 4 to 6 months with PCP; sooner if needed.  The patient was given clear instructions to go to ER or return to medical center if symptoms don't improve, worsen or new problems develop. The patient verbalized understanding. The patient was told to call to get lab results if they haven't heard anything in the next week.     Freeman Caldron, PA-C [facility] [place], [place]   06/12/2021, 9:15 AM

## 2021-06-13 LAB — HEMOGLOBIN A1C
Est. average glucose Bld gHb Est-mCnc: 123 mg/dL
Hgb A1c MFr Bld: 5.9 % — ABNORMAL HIGH (ref 4.8–5.6)

## 2021-06-13 LAB — CBC WITH DIFFERENTIAL/PLATELET
Basophils Absolute: 0.1 10*3/uL (ref 0.0–0.2)
Basos: 1 %
EOS (ABSOLUTE): 0.1 10*3/uL (ref 0.0–0.4)
Eos: 2 %
Hematocrit: 35.6 % (ref 34.0–46.6)
Hemoglobin: 10.8 g/dL — ABNORMAL LOW (ref 11.1–15.9)
Immature Grans (Abs): 0 10*3/uL (ref 0.0–0.1)
Immature Granulocytes: 0 %
Lymphocytes Absolute: 2.1 10*3/uL (ref 0.7–3.1)
Lymphs: 31 %
MCH: 21.3 pg — ABNORMAL LOW (ref 26.6–33.0)
MCHC: 30.3 g/dL — ABNORMAL LOW (ref 31.5–35.7)
MCV: 70 fL — ABNORMAL LOW (ref 79–97)
Monocytes Absolute: 0.8 10*3/uL (ref 0.1–0.9)
Monocytes: 12 %
Neutrophils Absolute: 3.6 10*3/uL (ref 1.4–7.0)
Neutrophils: 54 %
Platelets: 307 10*3/uL (ref 150–450)
RBC: 5.08 x10E6/uL (ref 3.77–5.28)
RDW: 17.8 % — ABNORMAL HIGH (ref 11.7–15.4)
WBC: 6.8 10*3/uL (ref 3.4–10.8)

## 2021-06-13 LAB — COMPREHENSIVE METABOLIC PANEL
ALT: 19 IU/L (ref 0–32)
AST: 22 IU/L (ref 0–40)
Albumin/Globulin Ratio: 2.2 (ref 1.2–2.2)
Albumin: 4.6 g/dL (ref 3.8–4.8)
Alkaline Phosphatase: 70 IU/L (ref 44–121)
BUN/Creatinine Ratio: 10 (ref 9–23)
BUN: 8 mg/dL (ref 6–24)
Bilirubin Total: 0.3 mg/dL (ref 0.0–1.2)
CO2: 23 mmol/L (ref 20–29)
Calcium: 10.2 mg/dL (ref 8.7–10.2)
Chloride: 98 mmol/L (ref 96–106)
Creatinine, Ser: 0.8 mg/dL (ref 0.57–1.00)
Globulin, Total: 2.1 g/dL (ref 1.5–4.5)
Glucose: 95 mg/dL (ref 65–99)
Potassium: 3.3 mmol/L — ABNORMAL LOW (ref 3.5–5.2)
Sodium: 136 mmol/L (ref 134–144)
Total Protein: 6.7 g/dL (ref 6.0–8.5)
eGFR: 94 mL/min/{1.73_m2} (ref >59)

## 2021-06-13 LAB — LIPID PANEL
Chol/HDL Ratio: 3.8 ratio (ref 0.0–4.4)
Cholesterol, Total: 177 mg/dL (ref 100–199)
HDL: 47 mg/dL (ref >39)
LDL Chol Calc (NIH): 93 mg/dL (ref 0–99)
Triglycerides: 219 mg/dL — ABNORMAL HIGH (ref 0–149)
VLDL Cholesterol Cal: 37 mg/dL (ref 5–40)

## 2021-06-13 LAB — IRON,TIBC AND FERRITIN PANEL
Ferritin: 8 ng/mL — ABNORMAL LOW (ref 15–150)
Iron Saturation: 4 % — CL (ref 15–55)
Iron: 21 ug/dL — ABNORMAL LOW (ref 27–159)
Total Iron Binding Capacity: 475 ug/dL — ABNORMAL HIGH (ref 250–450)
UIBC: 454 ug/dL — ABNORMAL HIGH (ref 131–425)

## 2021-06-18 ENCOUNTER — Other Ambulatory Visit: Payer: Self-pay

## 2021-06-18 ENCOUNTER — Other Ambulatory Visit: Payer: Self-pay | Admitting: Physician Assistant

## 2021-06-18 DIAGNOSIS — D508 Other iron deficiency anemias: Secondary | ICD-10-CM

## 2021-06-18 MED ORDER — FERROUS SULFATE 325 (65 FE) MG PO TABS
325.0000 mg | ORAL_TABLET | Freq: Every day | ORAL | 1 refills | Status: DC
  Filled 2021-06-18: qty 90, 90d supply, fill #0

## 2021-06-19 ENCOUNTER — Other Ambulatory Visit: Payer: Self-pay

## 2021-06-23 ENCOUNTER — Other Ambulatory Visit: Payer: Self-pay

## 2021-06-25 ENCOUNTER — Other Ambulatory Visit: Payer: Self-pay

## 2021-06-30 ENCOUNTER — Other Ambulatory Visit: Payer: Self-pay

## 2021-07-01 ENCOUNTER — Other Ambulatory Visit: Payer: Self-pay

## 2021-07-01 MED ORDER — PRAZOSIN HCL 1 MG PO CAPS
1.0000 mg | ORAL_CAPSULE | Freq: Every day | ORAL | 0 refills | Status: DC
  Filled 2021-07-01: qty 30, 30d supply, fill #0

## 2021-07-01 MED ORDER — HYDROXYZINE HCL 50 MG PO TABS
ORAL_TABLET | ORAL | 1 refills | Status: DC
  Filled 2021-07-01: qty 60, 30d supply, fill #0
  Filled 2021-08-21: qty 60, 30d supply, fill #1

## 2021-07-01 MED ORDER — VENLAFAXINE HCL ER 37.5 MG PO CP24
ORAL_CAPSULE | ORAL | 0 refills | Status: DC
  Filled 2021-07-01: qty 30, 30d supply, fill #0

## 2021-07-01 MED ORDER — ARIPIPRAZOLE 10 MG PO TABS
ORAL_TABLET | ORAL | 0 refills | Status: DC
  Filled 2021-07-01: qty 30, 30d supply, fill #0

## 2021-07-07 ENCOUNTER — Other Ambulatory Visit: Payer: Self-pay

## 2021-07-21 ENCOUNTER — Other Ambulatory Visit: Payer: Self-pay

## 2021-08-06 ENCOUNTER — Other Ambulatory Visit: Payer: Self-pay

## 2021-08-06 MED ORDER — ARIPIPRAZOLE 10 MG PO TABS
ORAL_TABLET | ORAL | 0 refills | Status: DC
  Filled 2021-08-06 – 2021-08-21 (×2): qty 30, 30d supply, fill #0

## 2021-08-06 MED ORDER — AMITRIPTYLINE HCL 50 MG PO TABS
ORAL_TABLET | ORAL | 0 refills | Status: DC
  Filled 2021-08-06: qty 30, 30d supply, fill #0

## 2021-08-06 MED ORDER — HYDROXYZINE HCL 50 MG PO TABS
ORAL_TABLET | ORAL | 0 refills | Status: DC
  Filled 2021-08-06: qty 60, 30d supply, fill #0

## 2021-08-13 ENCOUNTER — Other Ambulatory Visit: Payer: Self-pay

## 2021-08-21 ENCOUNTER — Other Ambulatory Visit: Payer: Self-pay

## 2021-09-10 ENCOUNTER — Other Ambulatory Visit: Payer: Self-pay

## 2021-09-10 ENCOUNTER — Ambulatory Visit (HOSPITAL_COMMUNITY)
Admission: EM | Admit: 2021-09-10 | Discharge: 2021-09-10 | Disposition: A | Discharge status: Home / Self Care | Payer: Medicaid Other | Attending: Emergency Medicine | Admitting: Emergency Medicine

## 2021-09-10 ENCOUNTER — Encounter (HOSPITAL_COMMUNITY): Payer: Self-pay

## 2021-09-10 DIAGNOSIS — B349 Viral infection, unspecified: Secondary | ICD-10-CM | POA: Insufficient documentation

## 2021-09-10 DIAGNOSIS — Z20822 Contact with and (suspected) exposure to covid-19: Secondary | ICD-10-CM | POA: Insufficient documentation

## 2021-09-10 LAB — SARS CORONAVIRUS 2 (TAT 6-24 HRS): SARS Coronavirus 2: NEGATIVE

## 2021-09-10 MED ORDER — ONDANSETRON 4 MG PO TBDP
4.0000 mg | ORAL_TABLET | Freq: Three times a day (TID) | ORAL | 0 refills | Status: DC | PRN
  Filled 2021-09-10: qty 20, 7d supply, fill #0

## 2021-09-10 NOTE — ED Provider Notes (Signed)
[facility]    CSN: [id number] Arrival date & time: 09/10/21  1215      History   Chief Complaint Chief Complaint  Patient presents with   Shortness of Breath   Nausea   Dizziness    HPI [name] is a 45 y.o. female.   Patient here for evaluation of shortness of breath, nausea, dizziness, fatigue, and body aches that have been ongoing for the past 4 to 5 days.  Denies any fevers at home but has not checked her temperature.  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  Reports taking Tylenol and ibuprofen for body aches and headaches.  Denies any trauma, injury, or other precipitating event.  Denies any specific alleviating or aggravating factors.  Denies any chest pain, numbness, tingling, weakness, abdominal pain, or headaches.    The history is provided by the patient.  Shortness of Breath Associated symptoms: vomiting   Dizziness Associated symptoms: nausea, shortness of breath and vomiting    Past Medical History:  Diagnosis Date   Abnormal Pap smear 1998   had colpo   Anxiety    Asthma    ,as child only   no inhaler   Bicornate uterus    Bicornuate uterus    Chlamydia 2000   GERD (gastroesophageal reflux disease)    HCV infection    HX of Hep C   Headache(784.0)    Hypertension 2010   IUP (intrauterine pregnancy), incidental 13 weeks   Opiate dependence ([place])    stopped methadone 01/2011   PONV (postoperative nausea and vomiting)    STD (female)    HX of STD's    Patient Active Problem List   Diagnosis Date Noted   Substance induced mood disorder ([place]) 07/15/2020   Pica 05/21/2020   Prediabetes 05/21/2020   Alcohol use disorder, severe, dependence ([place]) 05/21/2020   Diarrhea 03/24/2020   Hypokalemia 03/24/2020   Iron deficiency anemia 03/24/2020   MDD (major depressive disorder), recurrent, severe, 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[place])    Severe recurrent major depression  without psychotic features ([place]) 03/22/2020   Adjustment disorder with mixed anxiety and depressed mood 01/10/2020   Polysubstance abuse ([place]) 01/10/2020   Gastroesophageal reflux disease 03/08/2017   Anxiety 03/08/2017   Anxiety and depression 10/10/2015   History of intravenous drug use in remission 09/07/2011   HCV infection    Essential hypertension    Bipolar disorder ([place])    Bicornuate uterus     Past Surgical History:  Procedure Laterality Date   BILATERAL SALPINGECTOMY  12/06/2012   Procedure: BILATERAL SALPINGECTOMY;  Surgeon: Mora Bellman, MD;  Location: [facility];  Service: Gynecology;  Laterality: Bilateral;  laparoscopic   CERVICAL CERCLAGE  09/01/2011   Procedure: CERCLAGE CERVICAL;  Surgeon: Donnamae Jude, MD;  Location: [facility];  Service: Gynecology;  Laterality: N/A;   CERVICAL CERCLAGE  06/28/2012   Procedure: CERCLAGE CERVICAL;  Surgeon: Donnamae Jude, MD;  Location: [facility];  Service: Gynecology;  Laterality: N/A;   DILATION AND CURETTAGE OF UTERUS     SAB   FINGER SURGERY  2003   reattachment of right forefinger   THERAPEUTIC ABORTION     THERAPEUTIC ABORTION     TONSILLECTOMY     TUBAL LIGATION  11/23/2012    OB History     Gravida  7   Para  4   Term  2   Preterm  2   AB  3   Living  2      SAB  1   IAB  2   Ectopic  0   Multiple  0   Live Births  3            Home Medications    Prior to Admission medications   Medication Sig Start Date End Date Taking? Authorizing Provider  ondansetron (ZOFRAN ODT) 4 MG disintegrating tablet Take 1 tablet (4 mg total) by mouth every 8 (eight) hours as needed for nausea or vomiting. 09/10/21  Yes Pearson Forster, NP  amitriptyline (ELAVIL) 50 MG tablet take 1/2 tablet by mouth for 1 week. Then take 1 tablet by mouth every night at bedtime 04/10/21   Ladell Pier, MD  amitriptyline (ELAVIL) 50 MG tablet take 1/2 tablet by mouth for 4 days. Then take 1 tablet by mouth every night at bedtime  08/06/21     amLODipine (NORVASC) 10 MG tablet TAKE 1 TABLET (10 MG TOTAL) BY MOUTH DAILY. 06/12/21 06/12/22  Argentina Donovan, PA-C  ARIPiprazole (ABILIFY) 10 MG tablet take 1 tablet by oral route  every day 03/25/21     ARIPiprazole (ABILIFY) 10 MG tablet take 1 tablet by oral route  every day 08/06/21     ARIPiprazole (ABILIFY) 5 MG tablet Take 1/2 tablet by mouth every day for 1 week. Then take 1 tablet by mouth every day 06/02/21     atomoxetine (STRATTERA) 60 MG capsule TAKE 1 CAPSULE BY ORAL ROUTE EVERY DAY IN THE MORNING 12/25/20 12/25/21    busPIRone (BUSPAR) 5 MG tablet take 1/2 tablet by mouth 3 times every day for 1 week. Then take 1 tablet 3 times each day. 03/25/21     ferrous sulfate 325 (65 FE) MG tablet Take 1 tablet (325 mg total) by mouth daily with breakfast. 06/18/21   Argentina Donovan, PA-C  folic acid (FOLVITE) 1 MG tablet Take 1 tablet (1 mg total) by mouth daily. 07/23/20   Sharma Covert, MD  gabapentin (NEURONTIN) 300 MG capsule TAKE 1 CAPSULE BY ORAL ROUTE 3 TIMES EVERY DAY 12/25/20 12/25/21    hydrochlorothiazide (HYDRODIURIL) 25 MG tablet Take 1 tablet (25 mg total) by mouth daily. 06/12/21 06/12/22  Argentina Donovan, PA-C  hydrOXYzine (ATARAX/VISTARIL) 50 MG tablet take 1 tablet by mouth at bedtime. 03/25/21     hydrOXYzine (ATARAX/VISTARIL) 50 MG tablet Take 1 or 2 tablet by mouth every night at bedtime. 07/01/21     hydrOXYzine (ATARAX/VISTARIL) 50 MG tablet take 1 or 2 tablet by mouth every night at bedtime. 08/06/21     lisinopril (ZESTRIL) 20 MG tablet Take 1 tablet (20 mg total) by mouth daily. 06/12/21   Argentina Donovan, PA-C  metroNIDAZOLE (FLAGYL) 500 MG tablet Take 1 tablet (500 mg total) by mouth 2 (two) times daily. 05/21/21   Chase Picket, MD  OLANZapine (ZYPREXA) 5 MG tablet TAKE 1 TABLET BY MOUTH EVERY DAY 11/28/20 11/28/21    omeprazole (PRILOSEC) 40 MG capsule TAKE 1 CAPSULE (40 MG TOTAL) BY MOUTH DAILY. 06/12/21 06/12/22  Argentina Donovan, PA-C  senna-docusate  (SENOKOT-S) 8.6-50 MG tablet Take 1 tablet by mouth daily as needed for mild constipation. 06/05/20   Charlott Rakes, MD  thiamine 100 MG tablet Take 1 tablet (100 mg total) by mouth daily. 07/23/20   Sharma Covert, MD  valACYclovir (VALTREX) 500 MG tablet TAKE 1 TABLET (500 MG TOTAL) BY MOUTH DAILY. 12/12/20 12/12/21  Ladell Pier,  MD  losartan-hydrochlorothiazide (HYZAAR) 100-25 MG tablet Take 1 tablet by mouth daily. 06/10/19 01/02/20  Vanessa Kick, MD  pantoprazole (PROTONIX) 40 MG tablet Take 1 tablet (40 mg total) by mouth daily. 08/15/20 09/05/20  Charlott Rakes, MD  prazosin (MINIPRESS) 1 MG capsule Take 1 capsule by mouth every night at bedtime. 07/01/21 08/06/21    venlafaxine XR (EFFEXOR-XR) 37.5 MG 24 hr capsule Take 1 capsule by mouth every day with food 07/01/21 08/06/21      Family History Family History  Problem Relation Age of Onset   Hypertension Mother    Mental illness Mother        depression; alot of mental illness.   Cancer Father 75       Colon cancer age 63   Thyroid disease Maternal Grandmother    Diabetes Paternal Grandfather    Anesthesia problems Neg Hx    Other Neg Hx     Social History Social History   Tobacco Use   Smoking status: Every Day    Packs/day: 0.50    Years: 20.00    Pack years: 10.00    Types: Cigarettes    Start date: 05/04/2014   Smokeless tobacco: Never   Tobacco comments:    using the vapor only  Vaping Use   Vaping Use: Some days   Substances: Nicotine  Substance Use Topics   Alcohol use: Yes    Comment: at least 6 shots per day most days; Last drink last  Tuesday 07/09/20   Drug use: Not Currently    Types: Cocaine, IV, Marijuana, Heroin, Methamphetamines, Other-see comments    Comment: prescription pain meds     Allergies   Keflex [cephalexin] and Hydrocodone-acetaminophen   Review of Systems Review of Systems  Constitutional:  Positive for fatigue.  Respiratory:  Positive for shortness of breath.    Gastrointestinal:  Positive for nausea and vomiting.  Musculoskeletal:  Positive for myalgias.  Neurological:  Positive for dizziness.  All other systems reviewed and are negative.   Physical Exam Triage Vital Signs ED Triage Vitals  Enc Vitals Group     BP 09/10/21 1306 129/90     Pulse Rate 09/10/21 1306 99     Resp 09/10/21 1306 20     Temp --      Temp src --      SpO2 09/10/21 1306 98 %     Weight --      Height --      Head Circumference --      Peak Flow --      Pain Score 09/10/21 1304 1     Pain Loc --      Pain Edu? --      Excl. in [place]? --    No data found.  Updated Vital Signs BP 129/90 (BP Location: Left Arm)   Pulse 99   Resp 20   LMP 09/07/2021   SpO2 98%   Visual Acuity Right Eye Distance:   Left Eye Distance:   Bilateral Distance:    Right Eye Near:   Left Eye Near:    Bilateral Near:     Physical Exam Vitals and nursing note reviewed.  Constitutional:      General: She is not in acute distress.    Appearance: Normal appearance. She is not ill-appearing, toxic-appearing or diaphoretic.  HENT:     Head: Normocephalic and atraumatic.     Mouth/Throat:     Pharynx: No pharyngeal swelling or oropharyngeal exudate.  Eyes:  Extraocular Movements: Extraocular movements intact.     Conjunctiva/sclera: Conjunctivae normal.     Pupils: Pupils are equal, round, and reactive to light.  Cardiovascular:     Rate and Rhythm: Normal rate and regular rhythm.     Pulses: Normal pulses.     Heart sounds: Normal heart sounds.  Pulmonary:     Effort: Pulmonary effort is normal.     Breath sounds: Normal breath sounds.  Abdominal:     General: Abdomen is flat.     Palpations: Abdomen is soft.  Musculoskeletal:        General: Normal range of motion.     Cervical back: Normal range of motion.  Skin:    General: Skin is warm and dry.  Neurological:     General: No focal deficit present.     Mental Status: She is alert and oriented to person,  place, and time.     GCS: GCS eye subscore is 4. GCS verbal subscore is 5. GCS motor subscore is 6.     Cranial Nerves: No dysarthria or facial asymmetry.     Coordination: Coordination is intact.  Psychiatric:        Mood and Affect: Mood normal.     UC Treatments / Results  Labs (all labs ordered are listed, but only abnormal results are displayed) Labs Reviewed  SARS CORONAVIRUS 2 (TAT 6-24 HRS)    EKG   Radiology No results found.  Procedures Procedures (including critical care time)  Medications Ordered in UC Medications - No data to display  Initial Impression / Assessment and Plan / UC Course  I have reviewed the triage vital signs and the nursing notes.  Pertinent labs & imaging results that were available during my care of the patient were reviewed by me and considered in my medical decision making (see chart for details).    Assessment negative for red flags or concerns.  This is likely a viral illness.  COVID test pending.  Discussed current CDC guidelines for quarantine and work note given to patient.  May take Zofran as needed for nausea or vomiting.  Encourage fluids and rest.  Discussed conservative symptom management as described sections.  Tylenol and/or ibuprofen as needed.  Follow-up for reevaluation as needed. Final Clinical Impressions(s) / UC Diagnoses   Final diagnoses:  Viral illness     Discharge Instructions      We will contact you if your COVID test is positive.   You can take the Zofran as needed for nausea and vomiting.   Try a BRAT (bananas, rice, applesause, toast) or bland food diet for the next few days.  Stick with foods that are gentle on your stomach.  Avoid foods that are difficult to digest, such as diary.  As you feel better, you can start eating like you do normally.   You can use ginger (ginger ale, ginger candy) and mint for nausea.    You can take Tylenol and/or Ibuprofen as needed for fever reduction and pain relief.    For cough: honey 1/2 to 1 teaspoon (you can dilute the honey in water or another fluid).  You can also use guaifenesin and dextromethorphan for cough. You can use a humidifier for chest congestion and cough.  If you don't have a humidifier, you can sit in the bathroom with the hot shower running.     For sore throat: try warm salt water gargles, cepacol lozenges, throat spray, warm tea or water with lemon/honey, popsicles or  ice, or OTC cold relief medicine for throat discomfort.    For congestion: take a daily anti-histamine like Zyrtec, Claritin, and a oral decongestant, such as pseudoephedrine.  You can also use Flonase 1-2 sprays in each nostril daily.    It is important to stay hydrated: drink plenty of fluids (water, gatorade/powerade/pedialyte, juices, or teas) to keep your throat moisturized and help further relieve irritation/discomfort.   Return or go to the Emergency Department if symptoms worsen or do not improve in the next few days.      ED Prescriptions     Medication Sig Dispense Auth. Provider   ondansetron (ZOFRAN ODT) 4 MG disintegrating tablet Take 1 tablet (4 mg total) by mouth every 8 (eight) hours as needed for nausea or vomiting. 20 tablet Pearson Forster, NP      PDMP not reviewed this encounter.   Pearson Forster, NP 09/10/21 1404

## 2021-09-10 NOTE — ED Triage Notes (Signed)
Pt presents with shortness of breath, nausea, and dizziness X 4 days.

## 2021-09-10 NOTE — Discharge Instructions (Signed)
We will contact you if your COVID test is positive.   You can take the Zofran as needed for nausea and vomiting.   Try a BRAT (bananas, rice, applesause, toast) or bland food diet for the next few days.  Stick with foods that are gentle on your stomach.  Avoid foods that are difficult to digest, such as diary.  As you feel better, you can start eating like you do normally.   You can use ginger (ginger ale, ginger candy) and mint for nausea.    You can take Tylenol and/or Ibuprofen as needed for fever reduction and pain relief.   For cough: honey 1/2 to 1 teaspoon (you can dilute the honey in water or another fluid).  You can also use guaifenesin and dextromethorphan for cough. You can use a humidifier for chest congestion and cough.  If you don't have a humidifier, you can sit in the bathroom with the hot shower running.     For sore throat: try warm salt water gargles, cepacol lozenges, throat spray, warm tea or water with lemon/honey, popsicles or ice, or OTC cold relief medicine for throat discomfort.    For congestion: take a daily anti-histamine like Zyrtec, Claritin, and a oral decongestant, such as pseudoephedrine.  You can also use Flonase 1-2 sprays in each nostril daily.    It is important to stay hydrated: drink plenty of fluids (water, gatorade/powerade/pedialyte, juices, or teas) to keep your throat moisturized and help further relieve irritation/discomfort.   Return or go to the Emergency Department if symptoms worsen or do not improve in the next few days.

## 2021-09-15 ENCOUNTER — Encounter (HOSPITAL_COMMUNITY): Payer: Self-pay

## 2021-09-15 ENCOUNTER — Other Ambulatory Visit: Payer: Self-pay

## 2021-09-15 ENCOUNTER — Ambulatory Visit (HOSPITAL_COMMUNITY)
Admission: EM | Admit: 2021-09-15 | Discharge: 2021-09-15 | Disposition: A | Discharge status: Home / Self Care | Payer: Self-pay | Attending: Emergency Medicine | Admitting: Emergency Medicine

## 2021-09-15 DIAGNOSIS — R110 Nausea: Secondary | ICD-10-CM | POA: Insufficient documentation

## 2021-09-15 LAB — COMPREHENSIVE METABOLIC PANEL
ALT: 16 U/L (ref 0–44)
AST: 20 U/L (ref 15–41)
Albumin: 4.1 g/dL (ref 3.5–5.0)
Alkaline Phosphatase: 64 U/L (ref 38–126)
Anion gap: 10 (ref 5–15)
BUN: 6 mg/dL (ref 6–20)
CO2: 29 mmol/L (ref 22–32)
Calcium: 10 mg/dL (ref 8.9–10.3)
Chloride: 98 mmol/L (ref 98–111)
Creatinine, Ser: 0.9 mg/dL (ref 0.44–1.00)
GFR, Estimated: >60 mL/min (ref >60)
Glucose, Bld: 93 mg/dL (ref 70–99)
Potassium: 3.9 mmol/L (ref 3.5–5.1)
Sodium: 137 mmol/L (ref 135–145)
Total Bilirubin: 0.3 mg/dL (ref 0.3–1.2)
Total Protein: 7.2 g/dL (ref 6.5–8.1)

## 2021-09-15 MED ORDER — PROMETHAZINE HCL 25 MG PO TABS
25.0000 mg | ORAL_TABLET | Freq: Four times a day (QID) | ORAL | 0 refills | Status: DC | PRN
  Filled 2021-09-15: qty 30, 8d supply, fill #0

## 2021-09-15 NOTE — ED Triage Notes (Signed)
Pt presents with nausea and fatigue that has been unrelieved with prescribed medication for over a week.

## 2021-09-15 NOTE — ED Provider Notes (Signed)
[place]    CSN: [id number] Arrival date & time: 09/15/21  1642      History   Chief Complaint Chief Complaint  Patient presents with   Nausea   Fatigue    HPI [name] is a 45 y.o. female.   Patient presents with nausea, dizziness and increased fatigue for 1 week.  Recently had a viral illness where she spent 5 days intermittently vomiting.  Has begun to eat and drink but does not feel it is at her baseline.  Denies headaches, generalized weakness, numbness, tingling, changes in speech, lightheadedness, syncope.  Taking Zofran with minimal relief.  History of anxiety, asthma.   Past Medical History:  Diagnosis Date   Abnormal Pap smear 1998   had colpo   Anxiety    Asthma    ,as child only   no inhaler   Bicornate uterus    Bicornuate uterus    Chlamydia 2000   GERD (gastroesophageal reflux disease)    HCV infection    HX of Hep C   Headache(784.0)    Hypertension 2010   IUP (intrauterine pregnancy), incidental 13 weeks   Opiate dependence ([place])    stopped methadone 01/2011   PONV (postoperative nausea and vomiting)    STD (female)    HX of STD's    Patient Active Problem List   Diagnosis Date Noted   Substance induced mood disorder ([place]) 07/15/2020   Pica 05/21/2020   Prediabetes 05/21/2020   Alcohol use disorder, severe, dependence ([place]) 05/21/2020   Diarrhea 03/24/2020   Hypokalemia 03/24/2020   Iron deficiency anemia 03/24/2020   MDD (major depressive disorder), recurrent, severe, with psychosis ([place]) 03/23/2020   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[place])    Severe recurrent major depression without psychotic features ([place]) 03/22/2020   Adjustment disorder with mixed anxiety and depressed mood 01/10/2020   Polysubstance abuse ([place]) 01/10/2020   Gastroesophageal reflux disease 03/08/2017   Anxiety 03/08/2017   Anxiety and depression 10/10/2015   History of intravenous drug use in remission 09/07/2011   HCV infection    Essential  hypertension    Bipolar disorder ([place])    Bicornuate uterus     Past Surgical History:  Procedure Laterality Date   BILATERAL SALPINGECTOMY  12/06/2012   Procedure: BILATERAL SALPINGECTOMY;  Surgeon: Mora Bellman, MD;  Location: [facility];  Service: Gynecology;  Laterality: Bilateral;  laparoscopic   CERVICAL CERCLAGE  09/01/2011   Procedure: CERCLAGE CERVICAL;  Surgeon: Donnamae Jude, MD;  Location: [facility];  Service: Gynecology;  Laterality: N/A;   CERVICAL CERCLAGE  06/28/2012   Procedure: CERCLAGE CERVICAL;  Surgeon: Donnamae Jude, MD;  Location: [facility];  Service: Gynecology;  Laterality: N/A;   DILATION AND CURETTAGE OF UTERUS     SAB   FINGER SURGERY  2003   reattachment of right forefinger   THERAPEUTIC ABORTION     THERAPEUTIC ABORTION     TONSILLECTOMY     TUBAL LIGATION  11/23/2012    OB History     Gravida  7   Para  4   Term  2   Preterm  2   AB  3   Living  2      SAB  1   IAB  2   Ectopic  0   Multiple  0   Live Births  3            Home Medications    Prior to Admission medications   Medication Sig Start  Date End Date Taking? Authorizing Provider  promethazine (PHENERGAN) 25 MG tablet Take 1 tablet (25 mg total) by mouth every 6 (six) hours as needed for nausea or vomiting. 09/15/21  Yes Rasheena Talmadge R, NP  amitriptyline (ELAVIL) 50 MG tablet take 1/2 tablet by mouth for 1 week. Then take 1 tablet by mouth every night at bedtime 04/10/21   Ladell Pier, MD  amitriptyline (ELAVIL) 50 MG tablet take 1/2 tablet by mouth for 4 days. Then take 1 tablet by mouth every night at bedtime 08/06/21     amLODipine (NORVASC) 10 MG tablet TAKE 1 TABLET (10 MG TOTAL) BY MOUTH DAILY. 06/12/21 06/12/22  Argentina Donovan, PA-C  ARIPiprazole (ABILIFY) 10 MG tablet take 1 tablet by oral route  every day 03/25/21     ARIPiprazole (ABILIFY) 10 MG tablet take 1 tablet by oral route  every day 08/06/21     ARIPiprazole (ABILIFY) 5 MG tablet Take 1/2 tablet by mouth  every day for 1 week. Then take 1 tablet by mouth every day 06/02/21     atomoxetine (STRATTERA) 60 MG capsule TAKE 1 CAPSULE BY ORAL ROUTE EVERY DAY IN THE MORNING 12/25/20 12/25/21    busPIRone (BUSPAR) 5 MG tablet take 1/2 tablet by mouth 3 times every day for 1 week. Then take 1 tablet 3 times each day. 03/25/21     ferrous sulfate 325 (65 FE) MG tablet Take 1 tablet (325 mg total) by mouth daily with breakfast. 06/18/21   Argentina Donovan, PA-C  folic acid (FOLVITE) 1 MG tablet Take 1 tablet (1 mg total) by mouth daily. 07/23/20   Sharma Covert, MD  gabapentin (NEURONTIN) 300 MG capsule TAKE 1 CAPSULE BY ORAL ROUTE 3 TIMES EVERY DAY 12/25/20 12/25/21    hydrochlorothiazide (HYDRODIURIL) 25 MG tablet Take 1 tablet (25 mg total) by mouth daily. 06/12/21 06/12/22  Argentina Donovan, PA-C  hydrOXYzine (ATARAX/VISTARIL) 50 MG tablet take 1 tablet by mouth at bedtime. 03/25/21     hydrOXYzine (ATARAX/VISTARIL) 50 MG tablet Take 1 or 2 tablet by mouth every night at bedtime. 07/01/21     hydrOXYzine (ATARAX/VISTARIL) 50 MG tablet take 1 or 2 tablet by mouth every night at bedtime. 08/06/21     lisinopril (ZESTRIL) 20 MG tablet Take 1 tablet (20 mg total) by mouth daily. 06/12/21   Argentina Donovan, PA-C  metroNIDAZOLE (FLAGYL) 500 MG tablet Take 1 tablet (500 mg total) by mouth 2 (two) times daily. 05/21/21   Chase Picket, MD  OLANZapine (ZYPREXA) 5 MG tablet TAKE 1 TABLET BY MOUTH EVERY DAY 11/28/20 11/28/21    omeprazole (PRILOSEC) 40 MG capsule TAKE 1 CAPSULE (40 MG TOTAL) BY MOUTH DAILY. 06/12/21 06/12/22  Argentina Donovan, PA-C  ondansetron (ZOFRAN ODT) 4 MG disintegrating tablet Take 1 tablet (4 mg total) by mouth every 8 (eight) hours as needed for nausea or vomiting. 09/10/21   Pearson Forster, NP  senna-docusate (SENOKOT-S) 8.6-50 MG tablet Take 1 tablet by mouth daily as needed for mild constipation. 06/05/20   Charlott Rakes, MD  thiamine 100 MG tablet Take 1 tablet (100 mg total) by mouth daily. 07/23/20    Sharma Covert, MD  valACYclovir (VALTREX) 500 MG tablet TAKE 1 TABLET (500 MG TOTAL) BY MOUTH DAILY. 12/12/20 12/12/21  Ladell Pier, MD  losartan-hydrochlorothiazide (HYZAAR) 100-25 MG tablet Take 1 tablet by mouth daily. 06/10/19 01/02/20  Vanessa Kick, MD  pantoprazole (PROTONIX) 40 MG tablet Take 1 tablet (40  mg total) by mouth daily. 08/15/20 09/05/20  Charlott Rakes, MD  prazosin (MINIPRESS) 1 MG capsule Take 1 capsule by mouth every night at bedtime. 07/01/21 08/06/21    venlafaxine XR (EFFEXOR-XR) 37.5 MG 24 hr capsule Take 1 capsule by mouth every day with food 07/01/21 08/06/21      Family History Family History  Problem Relation Age of Onset   Hypertension Mother    Mental illness Mother        depression; alot of mental illness.   Cancer Father 8       Colon cancer age 16   Thyroid disease Maternal Grandmother    Diabetes Paternal Grandfather    Anesthesia problems Neg Hx    Other Neg Hx     Social History Social History   Tobacco Use   Smoking status: Every Day    Packs/day: 0.50    Years: 20.00    Pack years: 10.00    Types: Cigarettes    Start date: 05/04/2014   Smokeless tobacco: Never   Tobacco comments:    using the vapor only  Vaping Use   Vaping Use: Some days   Substances: Nicotine  Substance Use Topics   Alcohol use: Yes    Comment: at least 6 shots per day most days; Last drink last  Tuesday 07/09/20   Drug use: Not Currently    Types: Cocaine, IV, Marijuana, Heroin, Methamphetamines, Other-see comments    Comment: prescription pain meds     Allergies   Keflex [cephalexin] and Hydrocodone-acetaminophen   Review of Systems Review of Systems Defer to HPI    Physical Exam Triage Vital Signs ED Triage Vitals  Enc Vitals Group     BP 09/15/21 1811 (!) 132/92     Pulse Rate 09/15/21 1811 84     Resp 09/15/21 1811 17     Temp 09/15/21 1811 98.1 F (36.7 C)     Temp Source 09/15/21 1811 Oral     SpO2 09/15/21 1811 96 %     Weight --       Height --      Head Circumference --      Peak Flow --      Pain Score 09/15/21 1812 0     Pain Loc --      Pain Edu? --      Excl. in [place]? --    No data found.  Updated Vital Signs BP (!) 132/92 (BP Location: Right Arm)   Pulse 84   Temp 98.1 F (36.7 C) (Oral)   Resp 17   LMP 09/07/2021   SpO2 96%   Visual Acuity Right Eye Distance:   Left Eye Distance:   Bilateral Distance:    Right Eye Near:   Left Eye Near:    Bilateral Near:     Physical Exam Constitutional:      Appearance: Normal appearance. She is normal weight.  HENT:     Head: Normocephalic.     Right Ear: Tympanic membrane, ear canal and external ear normal.     Left Ear: Tympanic membrane, ear canal and external ear normal.     Nose: Nose normal.     Mouth/Throat:     Mouth: Mucous membranes are moist.     Pharynx: Oropharynx is clear.  Cardiovascular:     Rate and Rhythm: Normal rate and regular rhythm.     Pulses: Normal pulses.     Heart sounds: Normal heart sounds.  Pulmonary:  Effort: Pulmonary effort is normal.     Breath sounds: Normal breath sounds.  Abdominal:     General: Abdomen is flat. Bowel sounds are normal. There is no distension.     Palpations: Abdomen is soft.     Tenderness: There is no abdominal tenderness.  Musculoskeletal:     Cervical back: Normal range of motion and neck supple.  Skin:    General: Skin is warm and dry.  Neurological:     Mental Status: She is alert and oriented to person, place, and time. Mental status is at baseline.  Psychiatric:        Mood and Affect: Mood normal.        Behavior: Behavior normal.     UC Treatments / Results  Labs (all labs ordered are listed, but only abnormal results are displayed) Labs Reviewed  COMPREHENSIVE METABOLIC PANEL    EKG   Radiology No results found.  Procedures Procedures (including critical care time)  Medications Ordered in UC Medications - No data to display  Initial Impression /  Assessment and Plan / UC Course  I have reviewed the triage vital signs and the nursing notes.  Pertinent labs & imaging results that were available during my care of the patient were reviewed by me and considered in my medical decision making (see chart for details).  Nausea without vomiting  I do believe the etiology of symptoms are possible dehydration, will check electrolytes for abnormalities  Advised patient to increase dose of Zofran from 4 to 8 mg as needed Phenergan 25 mg every 6 hours as needed Advised increase fluid intake with electrolyte replacements substances Advised that if symptoms worsen at any point for syncopal episode occurs to go to nearest emergency department for further evaluation of hydration status Final Clinical Impressions(s) / UC Diagnoses   Final diagnoses:  Nausea without vomiting     Discharge Instructions      I do believe your symptoms today are being caused by dehydration due to your recent illness causing vomiting even though you have begun to eat and drink it has not been enough to replenish her body  Beginning today you may take 2 Zofran pills underneath the tongue to increase the effectiveness  You may attempt use of Phenergan every 6 hours as needed for nausea, be mindful of this medication may make you drowsy, please take first dose at home  Your lab work is pending, you will be notified of any concerning value  Please increase your fluid intake using electrolyte replacement substances such as Gatorade or similar products  At any point if your symptoms worsen or you pass out please go to the nearest emergency department for further evaluation   ED Prescriptions     Medication Sig Dispense Auth. Provider   promethazine (PHENERGAN) 25 MG tablet Take 1 tablet (25 mg total) by mouth every 6 (six) hours as needed for nausea or vomiting. 30 tablet Hans Eden, NP      PDMP not reviewed this encounter.   Hans Eden,  NP 09/15/21 1840

## 2021-09-15 NOTE — Discharge Instructions (Addendum)
I do believe your symptoms today are being caused by dehydration due to your recent illness causing vomiting even though you have begun to eat and drink it has not been enough to replenish her body  Beginning today you may take 2 Zofran pills underneath the tongue to increase the effectiveness  You may attempt use of Phenergan every 6 hours as needed for nausea, be mindful of this medication may make you drowsy, please take first dose at home  Your lab work is pending, you will be notified of any concerning value  Please increase your fluid intake using electrolyte replacement substances such as Gatorade or similar products  At any point if your symptoms worsen or you pass out please go to the nearest emergency department for further evaluation

## 2021-09-16 ENCOUNTER — Other Ambulatory Visit: Payer: Self-pay

## 2021-09-17 ENCOUNTER — Other Ambulatory Visit: Payer: Self-pay

## 2021-10-13 ENCOUNTER — Other Ambulatory Visit: Payer: Self-pay

## 2021-10-13 MED ORDER — ARIPIPRAZOLE 10 MG PO TABS
ORAL_TABLET | ORAL | 0 refills | Status: DC
  Filled 2021-10-13: qty 30, 30d supply, fill #0

## 2021-10-13 MED ORDER — HYDROXYZINE HCL 50 MG PO TABS
ORAL_TABLET | ORAL | 0 refills | Status: DC
  Filled 2021-10-13: qty 60, 30d supply, fill #0

## 2021-10-13 MED ORDER — AMITRIPTYLINE HCL 50 MG PO TABS
ORAL_TABLET | ORAL | 0 refills | Status: DC
  Filled 2021-10-13: qty 30, 30d supply, fill #0

## 2021-10-15 ENCOUNTER — Other Ambulatory Visit: Payer: Self-pay

## 2021-10-21 ENCOUNTER — Other Ambulatory Visit: Payer: Self-pay

## 2021-10-23 ENCOUNTER — Other Ambulatory Visit: Payer: Self-pay

## 2021-10-23 MED ORDER — HYDROXYZINE HCL 50 MG PO TABS
ORAL_TABLET | ORAL | 0 refills | Status: DC
  Filled 2021-10-23: qty 60, 30d supply, fill #0

## 2021-10-23 MED ORDER — ARIPIPRAZOLE 10 MG PO TABS
ORAL_TABLET | ORAL | 0 refills | Status: DC
  Filled 2021-10-23 – 2021-11-19 (×2): qty 30, 30d supply, fill #0

## 2021-10-23 MED ORDER — AMITRIPTYLINE HCL 50 MG PO TABS
ORAL_TABLET | ORAL | 0 refills | Status: DC
  Filled 2021-10-23 – 2022-01-29 (×2): qty 30, 30d supply, fill #0

## 2021-10-30 ENCOUNTER — Other Ambulatory Visit: Payer: Self-pay

## 2021-11-10 ENCOUNTER — Other Ambulatory Visit: Payer: Self-pay

## 2021-11-10 MED ORDER — ARIPIPRAZOLE 15 MG PO TABS
ORAL_TABLET | ORAL | 0 refills | Status: DC
  Filled 2021-11-10: qty 30, 30d supply, fill #0

## 2021-11-10 MED ORDER — AMITRIPTYLINE HCL 50 MG PO TABS
ORAL_TABLET | ORAL | 0 refills | Status: DC
  Filled 2021-11-10 – 2021-11-19 (×2): qty 30, 30d supply, fill #0

## 2021-11-11 ENCOUNTER — Other Ambulatory Visit: Payer: Self-pay

## 2021-11-12 ENCOUNTER — Ambulatory Visit (HOSPITAL_COMMUNITY)
Admission: EM | Admit: 2021-11-12 | Discharge: 2021-11-12 | Disposition: A | Discharge status: Home / Self Care | Payer: Self-pay | Attending: Family Medicine | Admitting: Family Medicine

## 2021-11-12 ENCOUNTER — Other Ambulatory Visit: Payer: Self-pay

## 2021-11-12 ENCOUNTER — Encounter (HOSPITAL_COMMUNITY): Payer: Self-pay

## 2021-11-12 DIAGNOSIS — F17210 Nicotine dependence, cigarettes, uncomplicated: Secondary | ICD-10-CM | POA: Insufficient documentation

## 2021-11-12 DIAGNOSIS — Z20822 Contact with and (suspected) exposure to covid-19: Secondary | ICD-10-CM | POA: Insufficient documentation

## 2021-11-12 DIAGNOSIS — J069 Acute upper respiratory infection, unspecified: Secondary | ICD-10-CM | POA: Insufficient documentation

## 2021-11-12 DIAGNOSIS — R197 Diarrhea, unspecified: Secondary | ICD-10-CM | POA: Insufficient documentation

## 2021-11-12 DIAGNOSIS — R1110 Vomiting, unspecified: Secondary | ICD-10-CM | POA: Insufficient documentation

## 2021-11-12 LAB — POC INFLUENZA A AND B ANTIGEN (URGENT CARE ONLY)
INFLUENZA A ANTIGEN, POC: NEGATIVE
INFLUENZA B ANTIGEN, POC: NEGATIVE

## 2021-11-12 NOTE — ED Notes (Signed)
Flu and covid swabs labeled and placed in mini lab

## 2021-11-12 NOTE — Discharge Instructions (Signed)
You have been tested for COVID-19 and influenza today. If your test returns positive, you will receive a phone call from [facility] regarding your results. Negative test results are not called. Both positive and negative results area always visible on MyChart. If you do not have a MyChart account, sign up instructions are provided in your discharge papers. Please do not hesitate to contact us should you have questions or concerns.  

## 2021-11-12 NOTE — ED Triage Notes (Signed)
Pt presents with c/o diarrhea, nausea and states she has taken the medication she has been prescribed for the sxs.   Reports her job needs a note stating she is good to go back to work.
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
## 2021-11-12 NOTE — ED Notes (Signed)
Called patient , states coming inside

## 2021-11-13 LAB — SARS CORONAVIRUS 2 (TAT 6-24 HRS): SARS Coronavirus 2: NEGATIVE

## 2021-11-19 ENCOUNTER — Other Ambulatory Visit: Payer: Self-pay

## 2021-12-23 ENCOUNTER — Other Ambulatory Visit: Payer: Self-pay

## 2021-12-23 ENCOUNTER — Encounter: Payer: Self-pay | Admitting: Nurse Practitioner

## 2021-12-23 ENCOUNTER — Ambulatory Visit: Payer: Self-pay

## 2021-12-23 ENCOUNTER — Ambulatory Visit: Payer: Self-pay | Attending: Nurse Practitioner | Admitting: Nurse Practitioner

## 2021-12-23 DIAGNOSIS — G43109 Migraine with aura, not intractable, without status migrainosus: Secondary | ICD-10-CM

## 2021-12-23 MED ORDER — SUMATRIPTAN SUCCINATE 50 MG PO TABS
50.0000 mg | ORAL_TABLET | Freq: Once | ORAL | 0 refills | Status: DC
  Filled 2021-12-23: qty 9, 23d supply, fill #0
  Filled 2022-02-13: qty 18, 30d supply, fill #0

## 2021-12-23 NOTE — Telephone Encounter (Signed)
°  Chief Complaint: headache Symptoms: headache and nausea, similar to migraines, eye pain Frequency: 1 month on and off Pertinent Negatives: NA Disposition: [] ED /[] Urgent Care (no appt availability in office) / [x] Appointment(In office/virtual)/ []  [facility]/ [] Home Care/ [] Refused Recommended Disposition /[] [facility]/ []  Follow-up with PCP Additional Notes: light and sound makes it worse and pt has to take ibuprofen and tylenol together to bare with symptoms.    Reason for Disposition  [1] SEVERE headache (e.g., excruciating) AND [2] not improved after 2 hours of pain medicine  Answer Assessment - Initial Assessment Questions 1. LOCATION: "Where does it hurt?"      R sided and back of neck 2. ONSET: "When did the headache start?" (Minutes, hours or days)      1 month 3. PATTERN: "Does the pain come and go, or has it been constant since it started?"     Comes and goes 4. SEVERITY: "How bad is the pain?" and "What does it keep you from doing?"  (e.g., Scale 1-10; mild, moderate, or severe)   - MILD (1-3): doesn't interfere with normal activities    - MODERATE (4-7): interferes with normal activities or awakens from sleep    - SEVERE (8-10): excruciating pain, unable to do any normal activities        7/8 7. MIGRAINE: "Have you been diagnosed with migraine headaches?" If Yes, ask: "Is this headache similar?"      Yes, similar to migraine 9. OTHER SYMPTOMS: "Do you have any other symptoms?" (fever, stiff neck, eye pain, sore throat, cold symptoms)     Eye pain behind eyes  Protocols used: Headache-A-AH
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
## 2021-12-23 NOTE — Telephone Encounter (Signed)
Pt called reporting that she has headaches + nausea, says this has been going on for a month. She is missing work because of this. Not sure if this is because of her BP.   Left message to call back about symptoms.

## 2021-12-24 ENCOUNTER — Other Ambulatory Visit: Payer: Self-pay

## 2021-12-31 ENCOUNTER — Other Ambulatory Visit: Payer: Self-pay

## 2022-01-01 ENCOUNTER — Other Ambulatory Visit: Payer: Self-pay

## 2022-01-05 ENCOUNTER — Other Ambulatory Visit: Payer: Self-pay

## 2022-01-05 MED ORDER — ARIPIPRAZOLE 15 MG PO TABS
ORAL_TABLET | ORAL | 0 refills | Status: DC
  Filled 2022-01-05 – 2022-02-02 (×4): qty 30, 30d supply, fill #0

## 2022-01-05 MED ORDER — HYDROXYZINE HCL 50 MG PO TABS
ORAL_TABLET | ORAL | 0 refills | Status: DC
  Filled 2022-01-05: qty 60, 30d supply, fill #0

## 2022-01-05 MED ORDER — BUPROPION HCL 75 MG PO TABS
ORAL_TABLET | ORAL | 0 refills | Status: DC
  Filled 2022-01-05 – 2022-01-29 (×2): qty 30, 30d supply, fill #0

## 2022-01-06 ENCOUNTER — Other Ambulatory Visit: Payer: Self-pay

## 2022-01-08 ENCOUNTER — Other Ambulatory Visit: Payer: Self-pay

## 2022-01-12 ENCOUNTER — Other Ambulatory Visit: Payer: Self-pay

## 2022-01-29 ENCOUNTER — Other Ambulatory Visit: Payer: Self-pay

## 2022-01-29 ENCOUNTER — Other Ambulatory Visit: Payer: Self-pay | Admitting: Physician Assistant

## 2022-01-29 DIAGNOSIS — R12 Heartburn: Secondary | ICD-10-CM

## 2022-01-29 MED ORDER — OMEPRAZOLE 40 MG PO CPDR
DELAYED_RELEASE_CAPSULE | Freq: Every day | ORAL | 1 refills | Status: DC
  Filled 2022-01-29: qty 90, 90d supply, fill #0

## 2022-01-29 NOTE — Telephone Encounter (Signed)
Requested Prescriptions  ?Pending Prescriptions Disp Refills  ?? omeprazole (PRILOSEC) 40 MG capsule 90 capsule 1  ?  Sig: TAKE 1 CAPSULE (40 MG TOTAL) BY MOUTH DAILY.  ?  ? Gastroenterology: Proton Pump Inhibitors Passed - 01/29/2022  4:16 PM  ?  ?  Passed - Valid encounter within last 12 months  ?  Recent Outpatient Visits   ?      ? 1 month ago Migraine with aura and without status migrainosus, not intractable  ? [place], [place] W, NP  ? 7 months ago Hyperglycemia  ? [address] [place], [place], [place]  ? 1 year ago Overweight  ? [place], [place], [place]  ? 1 year ago Severe recurrent major depression without psychotic features ([place])  ? [place] Ladell Pier, MD  ? 4 years ago Dental anomaly  ? [place] Charlott Rakes, MD  ?  ?  ?Future Appointments   ?        ? In 3 weeks Ladell Pier, MD [place]  ?  ? ?  ?  ?  ? ? ?

## 2022-01-30 ENCOUNTER — Other Ambulatory Visit: Payer: Self-pay

## 2022-02-02 ENCOUNTER — Other Ambulatory Visit: Payer: Self-pay

## 2022-02-04 ENCOUNTER — Other Ambulatory Visit: Payer: Self-pay

## 2022-02-13 ENCOUNTER — Other Ambulatory Visit: Payer: Self-pay

## 2022-02-20 ENCOUNTER — Telehealth (HOSPITAL_BASED_OUTPATIENT_CLINIC_OR_DEPARTMENT_OTHER): Payer: Self-pay | Admitting: Internal Medicine

## 2022-02-20 ENCOUNTER — Encounter: Payer: Self-pay | Admitting: Internal Medicine

## 2022-02-20 ENCOUNTER — Other Ambulatory Visit: Payer: Self-pay

## 2022-02-20 DIAGNOSIS — G43109 Migraine with aura, not intractable, without status migrainosus: Secondary | ICD-10-CM

## 2022-02-20 DIAGNOSIS — I10 Essential (primary) hypertension: Secondary | ICD-10-CM

## 2022-02-20 DIAGNOSIS — R109 Unspecified abdominal pain: Secondary | ICD-10-CM

## 2022-02-20 DIAGNOSIS — F3170 Bipolar disorder, currently in remission, most recent episode unspecified: Secondary | ICD-10-CM

## 2022-02-20 DIAGNOSIS — N390 Urinary tract infection, site not specified: Secondary | ICD-10-CM

## 2022-02-20 MED ORDER — SUMATRIPTAN SUCCINATE 50 MG PO TABS
ORAL_TABLET | ORAL | 0 refills | Status: DC
  Filled 2022-02-20: qty 20, 10d supply, fill #0

## 2022-02-20 NOTE — Progress Notes (Signed)
Virtual Visit via Video Note ? ?I connected with [name] on 02/20/2022 at 8:41 AM by a video enabled telemedicine application and verified that I am speaking with the correct person using two identifiers. ?This was supposed to be an in person visit but patient called and request that it be changed to a video visit. ?Location: ?Patient: home ?Provider: Office ?  ?I discussed the limitations of evaluation and management by telemedicine and the availability of in person appointments. The patient expressed understanding and agreed to proceed. ? ?History of Present Illness: ?Pt with hx of GERD, anxiety, depression, bipolar, HTN, preDM, hep C, history of polysubstance abuse (methamphetamine, cocaine, heroin in remission on theses, alcohol use disorder, IDA/PICA.  Last saw me 04/2020 ? ?Patient concern that she may have a urinary tract infection.  Complains of pain in both flank times a few days.  Gives history of frequent UTIs with last one being about 3 months ago.  On average she thinks she she has a UTI 6-7 times a year.  Denies any dysuria or hematuria at this time.  No fevers.  Had some nausea this morning. ? ?She is requesting referral to a therapist with behavioral health.  Currently sees Dr. Burt Knack at [facility] for medication management.  She has history of anxiety/depression/bipolar disorder.  They do not offer personal therapist.  They only offer group therapy and she does not do well with group therapy.  She is uninsured.  Hopes to get insurance in the next 3 months. ? ?Also complains of terrible headaches that occur about once to twice a month.  Usually located at the front or back of the head.  Symptoms include nausea, dizziness, spots in the visual field, photophobia and phenophobia.  Contributing factors includes increased stress and anxiety.  Headaches can last for days ?Gives history of migraines for years but feels they have been worst over the past 5 years. ?Used to take Imitrex but has been out of  it.  Takes Tylenol and ibuprofen which sometimes help and sometimes not. ?Not checking blood pressure consistently.  Last checked about 2 weeks ago and it was 130/85.  Reports compliance with taking her blood pressure medicines. ? ? ? ?Outpatient Encounter Medications as of 02/20/2022  ?Medication Sig Note  ? amitriptyline (ELAVIL) 50 MG tablet Take 1/2 tablet by mouth for 4 days. Then take 1 tablet by mouth every night at bedtime   ? amLODipine (NORVASC) 10 MG tablet TAKE 1 TABLET (10 MG TOTAL) BY MOUTH DAILY.   ? ARIPiprazole (ABILIFY) 10 MG tablet take 1 tablet by oral route  every day   ? ARIPiprazole (ABILIFY) 10 MG tablet Take 1 tablet by mouth every day   ? ARIPiprazole (ABILIFY) 15 MG tablet take 1 tablet by oral route  every day   ? ARIPiprazole (ABILIFY) 15 MG tablet Take 1 tablet by mouth every day   ? ARIPiprazole (ABILIFY) 5 MG tablet Take 1/2 tablet by mouth every day for 1 week. Then take 1 tablet by mouth every day   ? atomoxetine (STRATTERA) 60 MG capsule TAKE 1 CAPSULE BY ORAL ROUTE EVERY DAY IN THE MORNING   ? buPROPion (WELLBUTRIN) 75 MG tablet Take 1 tablet by mouth every morning at 11am   ? busPIRone (BUSPAR) 5 MG tablet take 1/2 tablet by mouth 3 times every day for 1 week. Then take 1 tablet 3 times each day.   ? ferrous sulfate 325 (65 FE) MG tablet Take 1 tablet (325 mg total) by  mouth daily with breakfast.   ? folic acid (FOLVITE) 1 MG tablet Take 1 tablet (1 mg total) by mouth daily.   ? gabapentin (NEURONTIN) 300 MG capsule TAKE 1 CAPSULE BY ORAL ROUTE 3 TIMES EVERY DAY   ? hydrochlorothiazide (HYDRODIURIL) 25 MG tablet Take 1 tablet (25 mg total) by mouth daily.   ? hydrOXYzine (ATARAX) 50 MG tablet Take 1 or 2 tablet by mouth every night at bedtime.   ? lisinopril (ZESTRIL) 20 MG tablet Take 1 tablet (20 mg total) by mouth daily.   ? metroNIDAZOLE (FLAGYL) 500 MG tablet Take 1 tablet (500 mg total) by mouth 2 (two) times daily.   ? OLANZapine (ZYPREXA) 5 MG tablet TAKE 1 TABLET BY MOUTH  EVERY DAY   ? omeprazole (PRILOSEC) 40 MG capsule TAKE 1 CAPSULE (40 MG TOTAL) BY MOUTH DAILY.   ? ondansetron (ZOFRAN ODT) 4 MG disintegrating tablet Take 1 tablet (4 mg total) by mouth every 8 (eight) hours as needed for nausea or vomiting.   ? promethazine (PHENERGAN) 25 MG tablet Take 1 tablet (25 mg total) by mouth every 6 (six) hours as needed for nausea or vomiting.   ? senna-docusate (SENOKOT-S) 8.6-50 MG tablet Take 1 tablet by mouth daily as needed for mild constipation.   ? SUMAtriptan (IMITREX) 50 MG tablet Take 1 tablet (50 mg total) by mouth once for 1 dose. May repeat in 2 hours if headache persists or recurs.   ? thiamine 100 MG tablet Take 1 tablet (100 mg total) by mouth daily.   ? [DISCONTINUED] losartan-hydrochlorothiazide (HYZAAR) 100-25 MG tablet Take 1 tablet by mouth daily. 01/02/2020: Needs refill 01/02/2020  ? [DISCONTINUED] pantoprazole (PROTONIX) 40 MG tablet Take 1 tablet (40 mg total) by mouth daily.   ? [DISCONTINUED] prazosin (MINIPRESS) 1 MG capsule Take 1 capsule by mouth every night at bedtime.   ? [DISCONTINUED] venlafaxine XR (EFFEXOR-XR) 37.5 MG 24 hr capsule Take 1 capsule by mouth every day with food   ? ?No facility-administered encounter medications on file as of 02/20/2022.  ? ? ?  ?Observations/Objective: ?Young Caucasian female sitting and appears in no acute distress. ? ?Assessment and Plan: ?1. Acute flank pain ?History at this time does not suggest acute UTI however we will still check urinalysis given the flank pain. ?- Urinalysis, Routine w reflex microscopic; Future ? ?2. Frequent UTI ?Patient advised that once she has insurance we can refer to urology for further evaluation. ?- Urinalysis, Routine w reflex microscopic; Future ? ?3. Migraine with aura and without status migrainosus, not intractable ?Discussed management of migraines to include the importance of getting adequate sleep, good nutrition habits and regular exercise.  Refill Imitrex to use as needed.  We will  hold off on adding prophylactic therapy given that episodes occur only 1-2x ?- SUMAtriptan (IMITREX) 50 MG tablet; 1 tab p.o. at start of headache as needed. May repeat in 2 hrs if no relief.  Max 2 tabs/24 hr  Dispense: 20 tablet; Refill: 0 ? ?4. Essential hypertension ?Continue her current medications which are lisinopril, hydrochlorothiazide and amlodipine.  We will have her scheduled for follow-up in person visit for follow-up on blood pressure. ? ?5. History of depressed bipolar disorder ([place]) ?Referral submitted to behavioral health requesting a personal therapist.  She will continuing seeing her [facility] at [facility]. ?- Ambulatory referral to Psychiatry ? ? ?Follow Up Instructions: ?8 wks ?  ?I discussed the assessment and treatment plan with the patient. The patient was provided an opportunity  to ask questions and all were answered. The patient agreed with the plan and demonstrated an understanding of the instructions. ?  ?The patient was advised to call back or seek an in-person evaluation if the symptoms worsen or if the condition fails to improve as anticipated. ? ?I spent 24 minutes dedicated to the care of this patient on the date of this encounter to include previsit review of chart, face-to-face time with the patient discussing diagnosis and management and post visit entering of orders. ? ?This note has been created with Surveyor, quantity. Any transcriptional errors are unintentional. ? ?Karle Plumber, MD ? ?

## 2022-02-23 ENCOUNTER — Other Ambulatory Visit: Payer: Self-pay

## 2022-02-23 MED ORDER — BUPROPION HCL 75 MG PO TABS
ORAL_TABLET | ORAL | 0 refills | Status: DC
  Filled 2022-02-23: qty 30, 30d supply, fill #0

## 2022-02-23 MED ORDER — ARIPIPRAZOLE 15 MG PO TABS
ORAL_TABLET | ORAL | 0 refills | Status: DC
  Filled 2022-02-23: qty 30, 30d supply, fill #0

## 2022-02-27 ENCOUNTER — Other Ambulatory Visit: Payer: Self-pay

## 2022-03-02 ENCOUNTER — Other Ambulatory Visit: Payer: Self-pay

## 2022-03-17 ENCOUNTER — Other Ambulatory Visit: Payer: Self-pay

## 2022-03-17 MED ORDER — ARIPIPRAZOLE 20 MG PO TABS
ORAL_TABLET | ORAL | 0 refills | Status: DC
  Filled 2022-03-17: qty 30, 30d supply, fill #0

## 2022-03-17 MED ORDER — BUPROPION HCL ER (XL) 150 MG PO TB24
ORAL_TABLET | ORAL | 0 refills | Status: DC
  Filled 2022-03-17: qty 30, 30d supply, fill #0

## 2022-03-23 ENCOUNTER — Other Ambulatory Visit: Payer: Self-pay

## 2022-03-25 ENCOUNTER — Encounter (HOSPITAL_COMMUNITY): Payer: Self-pay | Admitting: Emergency Medicine

## 2022-03-25 ENCOUNTER — Other Ambulatory Visit: Payer: Self-pay

## 2022-03-25 ENCOUNTER — Ambulatory Visit (HOSPITAL_COMMUNITY)
Admission: EM | Admit: 2022-03-25 | Discharge: 2022-03-25 | Disposition: A | Discharge status: Home / Self Care | Payer: Medicaid Other | Attending: Family Medicine | Admitting: Family Medicine

## 2022-03-25 DIAGNOSIS — K0889 Other specified disorders of teeth and supporting structures: Secondary | ICD-10-CM

## 2022-03-25 MED ORDER — CLINDAMYCIN HCL 300 MG PO CAPS
300.0000 mg | ORAL_CAPSULE | Freq: Three times a day (TID) | ORAL | 0 refills | Status: DC
  Filled 2022-03-25: qty 21, 7d supply, fill #0

## 2022-03-25 NOTE — ED Triage Notes (Signed)
Pt c/o intermittent right lower tooth pain. Reports tooth is broken.  ?
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
## 2022-04-01 ENCOUNTER — Other Ambulatory Visit: Payer: Self-pay

## 2022-04-14 ENCOUNTER — Ambulatory Visit (HOSPITAL_COMMUNITY)
Admission: EM | Admit: 2022-04-14 | Discharge: 2022-04-14 | Disposition: A | Discharge status: Home / Self Care | Payer: Self-pay | Attending: Internal Medicine | Admitting: Internal Medicine

## 2022-04-14 ENCOUNTER — Other Ambulatory Visit: Payer: Self-pay

## 2022-04-14 ENCOUNTER — Encounter (HOSPITAL_COMMUNITY): Payer: Self-pay | Admitting: Emergency Medicine

## 2022-04-14 DIAGNOSIS — E860 Dehydration: Secondary | ICD-10-CM

## 2022-04-14 DIAGNOSIS — R42 Dizziness and giddiness: Secondary | ICD-10-CM

## 2022-04-14 DIAGNOSIS — R9431 Abnormal electrocardiogram [ECG] [EKG]: Secondary | ICD-10-CM

## 2022-04-14 DIAGNOSIS — K130 Diseases of lips: Secondary | ICD-10-CM

## 2022-04-14 LAB — POCT URINALYSIS DIPSTICK, ED / UC
Bilirubin Urine: NEGATIVE
Glucose, UA: NEGATIVE mg/dL
Hgb urine dipstick: NEGATIVE
Ketones, ur: NEGATIVE mg/dL
Leukocytes,Ua: NEGATIVE
Nitrite: NEGATIVE
Protein, ur: NEGATIVE mg/dL
Specific Gravity, Urine: 1.015 (ref 1.005–1.030)
Urobilinogen, UA: 0.2 mg/dL (ref 0.0–1.0)
pH: 6.5 (ref 5.0–8.0)

## 2022-04-14 MED ORDER — IBUPROFEN 600 MG PO TABS
600.0000 mg | ORAL_TABLET | Freq: Four times a day (QID) | ORAL | 0 refills | Status: DC | PRN
  Filled 2022-04-14: qty 30, 8d supply, fill #0

## 2022-04-14 MED ORDER — ARIPIPRAZOLE 30 MG PO TABS
30.0000 mg | ORAL_TABLET | Freq: Every day | ORAL | 0 refills | Status: DC
  Filled 2022-04-14 – 2022-07-31 (×2): qty 30, 30d supply, fill #0

## 2022-04-14 MED ORDER — ONDANSETRON 4 MG PO TBDP
4.0000 mg | ORAL_TABLET | Freq: Once | ORAL | Status: AC
  Administered 2022-04-14: 4 mg via ORAL

## 2022-04-14 MED ORDER — ONDANSETRON 4 MG PO TBDP
ORAL_TABLET | ORAL | Status: AC
  Filled 2022-04-14: qty 1

## 2022-04-14 MED ORDER — BUPROPION HCL ER (XL) 150 MG PO TB24
ORAL_TABLET | ORAL | 0 refills | Status: DC
  Filled 2022-04-14: qty 30, 30d supply, fill #0

## 2022-04-14 NOTE — ED Provider Notes (Signed)
[name]    CSN: [id number] Arrival date & time: 04/14/22  1024      History   Chief Complaint Chief Complaint  Patient presents with   Dizziness   Headache    HPI [name] is a 46 y.o. female.   Patient presents to urgent care for evaluation after she became very dizzy, sweaty, nauseous and vomited while she was at work this morning. She checked her BP and it was 150s/110s after vomiting. She also reports small amount of chest pain, blurred vision and headache during episode that has since resolved. Denies left sided arm pain, radiating pain, and shortness of breath. She was able to drive herself to urgent care, but reports lingering dizziness and prefers to be in a position where she is laying flat so that dizziness improves. No syncope.  She took her blood pressure medication this morning (losartan, lisinopril, and hydrochlorothiazide) and denies recent changes to her medications.  She reports only drinking approximately 1 cup of water per day and drinks 3-4 regular Coca-Cola sodas per day.  Reports urinary frequency especially at night that keeps her up at night and disrupts her sleep.  Denies dysuria and urinary urgency.  She is currently mildly nauseous.  Denies URI symptoms.  Denies history of heart problems.  She denies fever and chills at home and is afebrile in clinic today.  Denies any other aggravating or relieving factors for symptoms.    Dizziness Associated symptoms: headaches   Headache Associated symptoms: dizziness    Past Medical History:  Diagnosis Date   Abnormal Pap smear 1998   had colpo   Anxiety    Asthma    ,as child only   no inhaler   Bicornate uterus    Bicornuate uterus    Chlamydia 2000   GERD (gastroesophageal reflux disease)    HCV infection    HX of Hep C   Headache(784.0)    Hypertension 2010   IUP (intrauterine pregnancy), incidental 13 weeks   Opiate dependence ([place])    stopped methadone 01/2011   PONV  (postoperative nausea and vomiting)    STD (female)    HX of STD's    Patient Active Problem List   Diagnosis Date Noted   Substance induced mood disorder ([place]) 07/15/2020   Pica 05/21/2020   Prediabetes 05/21/2020   Alcohol use disorder, severe, dependence ([place]) 05/21/2020   Diarrhea 03/24/2020   Hypokalemia 03/24/2020   Iron deficiency anemia 03/24/2020   MDD (major depressive disorder), recurrent, severe, 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[place])    Severe recurrent major depression without psychotic features ([place]) 03/22/2020   Adjustment disorder with mixed anxiety and depressed mood 01/10/2020   Polysubstance abuse ([place]) 01/10/2020   Gastroesophageal reflux disease 03/08/2017   Anxiety 03/08/2017   Anxiety and depression 10/10/2015   History of intravenous drug use in remission 09/07/2011   HCV infection    Essential hypertension    Bipolar disorder ([place])    Bicornuate uterus     Past Surgical History:  Procedure Laterality Date   BILATERAL SALPINGECTOMY  12/06/2012   Procedure: BILATERAL SALPINGECTOMY;  Surgeon: Mora Bellman, MD;  Location: [facility];  Service: Gynecology;  Laterality: Bilateral;  laparoscopic   CERVICAL CERCLAGE  09/01/2011   Procedure: CERCLAGE CERVICAL;  Surgeon: Donnamae Jude, MD;  Location: [facility];  Service: Gynecology;  Laterality: N/A;   CERVICAL CERCLAGE  06/28/2012   Procedure: CERCLAGE CERVICAL;  Surgeon: Donnamae Jude, MD;  Location: [facility];  Service: Gynecology;  Laterality: N/A;   DILATION AND CURETTAGE OF UTERUS     SAB   FINGER SURGERY  2003   reattachment of right forefinger   THERAPEUTIC ABORTION     THERAPEUTIC ABORTION     TONSILLECTOMY     TUBAL LIGATION  11/23/2012    OB History     Gravida  7   Para  4   Term  2   Preterm  2   AB  3   Living  2      SAB  1   IAB  2   Ectopic  0   Multiple  0   Live Births  3            Home Medications    Prior to Admission medications    Medication Sig Start Date End Date Taking? Authorizing Provider  ibuprofen (ADVIL) 600 MG tablet Take 1 tablet (600 mg total) by mouth every 6 (six) hours as needed for headache. 04/14/22  Yes Jaydyn Bozzo, Stasia Cavalier, FNP  amitriptyline (ELAVIL) 50 MG tablet Take 1/2 tablet by mouth for 4 days. Then take 1 tablet by mouth every night at bedtime 10/23/21     amLODipine (NORVASC) 10 MG tablet TAKE 1 TABLET (10 MG TOTAL) BY MOUTH DAILY. 06/12/21 06/12/22  Argentina Donovan, PA-C  ARIPiprazole (ABILIFY) 10 MG tablet take 1 tablet by oral route  every day 03/25/21     ARIPiprazole (ABILIFY) 10 MG tablet Take 1 tablet by mouth every day 10/23/21     ARIPiprazole (ABILIFY) 15 MG tablet take 1 tablet by oral route  every day 11/10/21     ARIPiprazole (ABILIFY) 15 MG tablet Take 1 tablet by mouth every day 01/05/22     ARIPiprazole (ABILIFY) 15 MG tablet Take 1 tablet by mouth daily. 02/19/22     ARIPiprazole (ABILIFY) 20 MG tablet take 1 tablet by mouth every day 03/17/22     ARIPiprazole (ABILIFY) 5 MG tablet Take 1/2 tablet by mouth every day for 1 week. Then take 1 tablet by mouth every day 06/02/21     atomoxetine (STRATTERA) 60 MG capsule TAKE 1 CAPSULE BY ORAL ROUTE EVERY DAY IN THE MORNING 12/25/20 12/25/21    buPROPion (WELLBUTRIN XL) 150 MG 24 hr tablet take 1 tablet by mouth every day 03/17/22     busPIRone (BUSPAR) 5 MG tablet take 1/2 tablet by mouth 3 times every day for 1 week. Then take 1 tablet 3 times each day. 03/25/21     clindamycin (CLEOCIN) 300 MG capsule Take 1 capsule (300 mg total) by mouth 3 (three) times daily. 03/25/22   Vanessa Kick, MD  ferrous sulfate 325 (65 FE) MG tablet Take 1 tablet (325 mg total) by mouth daily with breakfast. 06/18/21   Argentina Donovan, PA-C  folic acid (FOLVITE) 1 MG tablet Take 1 tablet (1 mg total) by mouth daily. 07/23/20   Sharma Covert, MD  gabapentin (NEURONTIN) 300 MG capsule TAKE 1 CAPSULE BY ORAL ROUTE 3 TIMES EVERY DAY 12/25/20 12/25/21    hydrochlorothiazide  (HYDRODIURIL) 25 MG tablet Take 1 tablet (25 mg total) by mouth daily. 06/12/21 06/12/22  Argentina Donovan, PA-C  hydrOXYzine (ATARAX) 50 MG tablet Take 1 or 2 tablet by mouth every night at bedtime. 10/23/21     lisinopril (ZESTRIL) 20 MG tablet Take 1 tablet (20 mg total) by mouth daily. 06/12/21   Argentina Donovan, PA-C  OLANZapine (ZYPREXA) 5  MG tablet TAKE 1 TABLET BY MOUTH EVERY DAY 11/28/20 11/28/21    omeprazole (PRILOSEC) 40 MG capsule TAKE 1 CAPSULE (40 MG TOTAL) BY MOUTH DAILY. 01/29/22 01/29/23  Ladell Pier, MD  ondansetron (ZOFRAN ODT) 4 MG disintegrating tablet Take 1 tablet (4 mg total) by mouth every 8 (eight) hours as needed for nausea or vomiting. 09/10/21   Pearson Forster, NP  promethazine (PHENERGAN) 25 MG tablet Take 1 tablet (25 mg total) by mouth every 6 (six) hours as needed for nausea or vomiting. 09/15/21   White, Leitha Schuller, NP  senna-docusate (SENOKOT-S) 8.6-50 MG tablet Take 1 tablet by mouth daily as needed for mild constipation. 06/05/20   Charlott Rakes, MD  SUMAtriptan (IMITREX) 50 MG tablet take 1 tablet by mouth at the start of headache as needed. (May repeat in 2 hrs if no relief.  Max 2 tabs/24 hr) 02/20/22   Ladell Pier, MD  thiamine 100 MG tablet Take 1 tablet (100 mg total) by mouth daily. 07/23/20   Sharma Covert, MD  losartan-hydrochlorothiazide (HYZAAR) 100-25 MG tablet Take 1 tablet by mouth daily. 06/10/19 01/02/20  Vanessa Kick, MD  pantoprazole (PROTONIX) 40 MG tablet Take 1 tablet (40 mg total) by mouth daily. 08/15/20 09/05/20  Charlott Rakes, MD  prazosin (MINIPRESS) 1 MG capsule Take 1 capsule by mouth every night at bedtime. 07/01/21 08/06/21    venlafaxine XR (EFFEXOR-XR) 37.5 MG 24 hr capsule Take 1 capsule by mouth every day with food 07/01/21 08/06/21      Family History Family History  Problem Relation Age of Onset   Hypertension Mother    Mental illness Mother        depression; alot of mental illness.   Cancer Father 4       Colon  cancer age 74   Thyroid disease Maternal Grandmother    Diabetes Paternal Grandfather    Anesthesia problems Neg Hx    Other Neg Hx     Social History Social History   Tobacco Use   Smoking status: Every Day    Packs/day: 0.50    Years: 20.00    Pack years: 10.00    Types: Cigarettes    Start date: 05/04/2014   Smokeless tobacco: Never   Tobacco comments:    using the vapor only  Vaping Use   Vaping Use: Some days   Substances: Nicotine  Substance Use Topics   Alcohol use: Yes    Comment: at least 6 shots per day most days; Last drink last  Tuesday 07/09/20   Drug use: Not Currently    Types: Cocaine, IV, Marijuana, Heroin, Methamphetamines, Other-see comments    Comment: prescription pain meds     Allergies   Keflex [cephalexin] and Hydrocodone-acetaminophen   Review of Systems Review of Systems  Neurological:  Positive for dizziness and headaches.  Per HPI  Physical Exam Triage Vital Signs ED Triage Vitals  Enc Vitals Group     BP 04/14/22 1113 127/82     Pulse Rate 04/14/22 1113 85     Resp 04/14/22 1113 16     Temp 04/14/22 1113 97.6 F (36.4 C)     Temp Source 04/14/22 1113 Oral     SpO2 04/14/22 1113 97 %     Weight --      Height --      Head Circumference --      Peak Flow --      Pain Score 04/14/22 1115 3  Pain Loc --      Pain Edu? --      Excl. in [place]? --    No data found.  Updated Vital Signs BP 122/87 (BP Location: Left Arm)   Pulse 86   Temp 97.6 F (36.4 C) (Oral)   Resp 16   SpO2 98%   Visual Acuity Right Eye Distance:   Left Eye Distance:   Bilateral Distance:    Right Eye Near:   Left Eye Near:    Bilateral Near:     Physical Exam Vitals and nursing note reviewed.  Constitutional:      General: She is not in acute distress.    Appearance: Normal appearance. She is well-developed. She is not ill-appearing or diaphoretic.  HENT:     Head: Normocephalic and atraumatic.     Right Ear: Tympanic membrane, ear canal  and external ear normal.     Left Ear: Tympanic membrane, ear canal and external ear normal.     Nose: Nose normal. No congestion or rhinorrhea.     Mouth/Throat:     Lips: Pink.     Mouth: Mucous membranes are moist.     Tongue: No lesions. Tongue does not deviate from midline.     Pharynx: Oropharynx is clear. Uvula midline. No posterior oropharyngeal erythema or uvula swelling.     Tonsils: No tonsillar exudate or tonsillar abscesses.     Comments: Lips appear dry with cheilitis noted to corners of lips bilaterally.  Eyes:     General: Lids are normal. Vision grossly intact. Gaze aligned appropriately.     Extraocular Movements: Extraocular movements intact.     Conjunctiva/sclera: Conjunctivae normal.     Pupils: Pupils are equal, round, and reactive to light.  Cardiovascular:     Rate and Rhythm: Normal rate and regular rhythm.     Heart sounds: Normal heart sounds. No murmur heard.   No friction rub. No gallop.  Pulmonary:     Effort: Pulmonary effort is normal. No respiratory distress.     Breath sounds: Normal breath sounds. No wheezing, rhonchi or rales.  Chest:     Chest wall: No tenderness.  Abdominal:     General: Abdomen is flat. Bowel sounds are normal. There is no distension.     Palpations: Abdomen is soft.     Tenderness: There is abdominal tenderness in the right lower quadrant and left lower quadrant. There is left CVA tenderness. There is no right CVA tenderness or rebound.  Musculoskeletal:        General: No swelling.     Cervical back: Neck supple.     Right lower leg: No edema.     Left lower leg: No edema.  Skin:    General: Skin is warm and dry.     Capillary Refill: Capillary refill takes less than 2 seconds.     Findings: No rash.  Neurological:     General: No focal deficit present.     Mental Status: She is alert and oriented to person, place, and time. Mental status is at baseline.     Cranial Nerves: Cranial nerves 2-12 are intact. No cranial  nerve deficit.     Sensory: Sensation is intact. No sensory deficit.     Motor: Motor function is intact. No weakness.     Coordination: Coordination is intact.     Gait: Gait is intact.  Psychiatric:        Mood and Affect: Mood normal.  Behavior: Behavior normal.        Thought Content: Thought content normal.        Judgment: Judgment normal.     UC Treatments / Results  Labs (all labs ordered are listed, but only abnormal results are displayed) Labs Reviewed  POCT URINALYSIS DIPSTICK, ED / UC    EKG   Radiology No results found.  Procedures Procedures (including critical care time)  Medications Ordered in UC Medications  ondansetron (ZOFRAN-ODT) disintegrating tablet 4 mg (4 mg Oral Given 04/14/22 1236)    Initial Impression / Assessment and Plan / UC Course  I have reviewed the triage vital signs and the nursing notes.  Pertinent labs & imaging results that were available during my care of the patient were reviewed by me and considered in my medical decision making (see chart for details).  Concern for cardiac abnormality cause to patient's nausea, vomiting, chest pain, and dizziness today.  EKG performed today shows no acute cardiac abnormality with a ventricular rate of 78 bpm and normal sinus rhythm.  QT interval is prolonged at 94 ms.  Patient is a former IV drug abuser and this could be the potential cause for QT prolongation.  Patient is not having any chest pain at this time.  No clinical medication for emergent/urgent evaluation at this time based on physical exam and vital signs.  Patient's nausea and dizziness improved after Zofran administration in clinic.  Suspect that patient's episode of vomiting, nausea, dizziness, and headache today was likely caused by vasovagal response with contributing dehydration as a factor.  No clinical indication for IV fluid bolus at this time based on patient's physical exam and vital signs.  She is not in any acute  distress.  Advised patient to increase her daily water intake to at least 64 ounces per day.  She is to purchase a 32 ounce water bottle and drink one of them prior to eating lunch, then refill the water bottle and drink another 1 prior to eating dinner.  She is to limit her fluid intake after 7 PM to prevent sleep disturbance related to frequent urination at night.  She is to limit her Coca-Cola intake to 2 cans of Coca-Cola per day.  Recommend lab work today to assess for kidney function.  Patient declines.  Urinalysis is negative for infection today.  Specific gravity is normal.  Patient to apply Aquaphor to lips for cheilitis.  Adequate hydration will also help with dry lips.  She may take ibuprofen every 6 hours as needed with food for headache.  Work note given for patient to return to work on Friday, Apr 17, 2022.  Encouraged patient to rest in the meantime and focus on rehydration.  Counseled patient regarding appropriate use of medications and potential side effects for all medications recommended or prescribed today. Discussed red flag signs and symptoms of worsening condition,when to call the PCP office, return to urgent care, and when to seek higher level of care. Patient verbalizes understanding and agreement with plan. All questions answered. Patient discharged in stable condition.  Final Clinical Impressions(s) / UC Diagnoses   Final diagnoses:  Dehydration  Dizziness     Discharge Instructions      Your EKG and urinalysis are both normal today.  Your episode of vomiting this morning was likely a vasovagal response where your blood pressure got too low in your body vomited to attempt to raise your blood pressure.  Dehydration may have been a contributing cause to this.  I would like for you to go home and drink at least 64 ounces of water per day.  Decrease the amount of Coca-Cola you are drinking as this further dehydrates you.  You may take ibuprofen as needed for headache.    Rest for the next couple of days and return to work on Friday, Apr 17, 2022.    If you develop any new or worsening symptoms or do not improve in the next 2 to 3 days, please return.  If your symptoms are severe, please go to the emergency room.  Follow-up with your primary care provider for further evaluation and management of your symptoms as well as ongoing wellness visits.  I hope you feel better!     ED Prescriptions     Medication Sig Dispense Auth. Provider   ibuprofen (ADVIL) 600 MG tablet Take 1 tablet (600 mg total) by mouth every 6 (six) hours as needed for headache. 30 tablet Talbot Grumbling, FNP      PDMP not reviewed this encounter.   Talbot Grumbling, [place] 04/14/22 1335

## 2022-04-14 NOTE — Discharge Instructions (Addendum)
Your EKG and urinalysis are both normal today.  Your episode of vomiting this morning was likely a vasovagal response where your blood pressure got too low in your body vomited to attempt to raise your blood pressure.  Dehydration may have been a contributing cause to this.  I would like for you to go home and drink at least 64 ounces of water per day.  Decrease the amount of Coca-Cola you are drinking as this further dehydrates you.  You may take ibuprofen as needed for headache.   Rest for the next couple of days and return to work on Friday, Apr 17, 2022.    If you develop any new or worsening symptoms or do not improve in the next 2 to 3 days, please return.  If your symptoms are severe, please go to the emergency room.  Follow-up with your primary care provider for further evaluation and management of your symptoms as well as ongoing wellness visits.  I hope you feel better!

## 2022-04-14 NOTE — ED Triage Notes (Signed)
Pt reports dizziness, and a BP of 157/110 while at work today. Started 2 hours ago. States she takes 3 different pills for her BP, and her manager told her to come get checked out. BP is 127/82 in triage. Reports a headache, 3/10, mild nausea, and improving dizziness at the moment. Does not appear to be actively distressed. Requesting a note for work.

## 2022-04-21 ENCOUNTER — Other Ambulatory Visit: Payer: Self-pay

## 2022-04-23 ENCOUNTER — Ambulatory Visit: Payer: Medicaid Other | Admitting: Internal Medicine

## 2022-05-15 ENCOUNTER — Other Ambulatory Visit: Payer: Self-pay

## 2022-05-15 MED ORDER — ARIPIPRAZOLE 30 MG PO TABS
ORAL_TABLET | ORAL | 0 refills | Status: DC
  Filled 2022-05-15: qty 30, 30d supply, fill #0

## 2022-05-15 MED ORDER — BUPROPION HCL 75 MG PO TABS
ORAL_TABLET | ORAL | 0 refills | Status: DC
  Filled 2022-05-15 – 2022-06-01 (×2): qty 60, 30d supply, fill #0

## 2022-05-18 ENCOUNTER — Other Ambulatory Visit: Payer: Self-pay

## 2022-05-19 ENCOUNTER — Other Ambulatory Visit: Payer: Self-pay

## 2022-05-22 ENCOUNTER — Other Ambulatory Visit: Payer: Self-pay

## 2022-05-27 ENCOUNTER — Other Ambulatory Visit: Payer: Self-pay

## 2022-05-30 ENCOUNTER — Ambulatory Visit (HOSPITAL_COMMUNITY)
Admission: EM | Admit: 2022-05-30 | Discharge: 2022-05-30 | Disposition: A | Discharge status: Home / Self Care | Payer: PRIVATE HEALTH INSURANCE | Attending: Internal Medicine | Admitting: Internal Medicine

## 2022-05-30 ENCOUNTER — Other Ambulatory Visit: Payer: Self-pay

## 2022-05-30 ENCOUNTER — Encounter (HOSPITAL_COMMUNITY): Payer: Self-pay | Admitting: *Deleted

## 2022-05-30 DIAGNOSIS — R110 Nausea: Secondary | ICD-10-CM | POA: Diagnosis not present

## 2022-05-30 DIAGNOSIS — D509 Iron deficiency anemia, unspecified: Secondary | ICD-10-CM

## 2022-05-30 DIAGNOSIS — N939 Abnormal uterine and vaginal bleeding, unspecified: Secondary | ICD-10-CM

## 2022-05-30 MED ORDER — MEGESTROL ACETATE 40 MG PO TABS: 40.0000 mg | ORAL_TABLET | Freq: Every day | ORAL | 0 refills | Status: DC

## 2022-05-30 MED ORDER — DOCUSATE SODIUM 100 MG PO CAPS: 100.0000 mg | ORAL_CAPSULE | Freq: Two times a day (BID) | ORAL | 0 refills | Status: DC

## 2022-05-30 MED ORDER — FERROUS SULFATE 325 (65 FE) MG PO TABS: 325.0000 mg | ORAL_TABLET | Freq: Every day | ORAL | 0 refills | Status: DC

## 2022-05-30 MED ORDER — FERROUS SULFATE 325 (65 FE) MG PO TABS: 325.0000 mg | ORAL_TABLET | ORAL | 0 refills | Status: DC

## 2022-05-30 MED ORDER — POLYETHYLENE GLYCOL 3350 17 GM/SCOOP PO POWD
1.0000 | Freq: Once | ORAL | 0 refills | Status: AC
Start: 2022-05-30 — End: 2022-05-30

## 2022-05-30 NOTE — ED Triage Notes (Signed)
Pt reports she had to leave work this morning because of a Ha,Nausea and possible elevated BP.

## 2022-05-30 NOTE — ED Provider Notes (Signed)
[place]    CSN: [id number] Arrival date & time: 05/30/22  1000      History   Chief Complaint Chief Complaint  Patient presents with   Headache    HPI [name] is a 46 y.o. female.   Patient presents urgent care for evaluation of intermittent headache, nausea, and fatigue over the last few days.  Patient started her menstrual cycle 4 days ago and states that her menstrual cycles cause her to bleed very heavily.  She has been changing a super tampon every 15 to 30 minutes over the last 3 to 4 days and states that she has to wear a peri-pad along with the tampon due to excessive vaginal bleeding.  Reports intermittent generalized head pain and nausea.  She has been chewing on ice frequently recently and attempt to stay hydrated.  She drinks approximately 1 cup of water per day and mostly drinks regular Coca-Colas.  Also reporting a small amount of intermittent shortness of breath over the last 3 to 4 days.  Patient states that she normally gets her period every month, but sometimes has her menstrual cycle more than 1 time per month.  The first menstrual cycle in the month usually lasts approximately 1 week with heavy bleeding, then the second menstrual cycle is usually 1 to 2 days with lighter bleeding.  Reports a small amount of abdominal cramping that is mild at this time and associated with menstrual cramps.  Denies vomiting, diarrhea, constipation, vaginal discharge, urinary frequency, dysuria, urinary urgency, and fever/chills.  She has not had blood work done recently to assess for anemia, but states that she has an appointment with [facility] in the next few weeks to establish care as she recently got insurance.  She denies pain at this time.  She has not attempted use of any over-the-counter medications prior to arriving urgent care.  Denies chance of pregnancy as she has a history of bilateral tubal ligation and exposure to STIs.     Headache   Past Medical History:  Diagnosis Date   Abnormal Pap smear 1998   had colpo   Anxiety    Asthma    ,as child only   no inhaler   Bicornate uterus    Bicornuate uterus    Chlamydia 2000   GERD (gastroesophageal reflux disease)    HCV infection    HX of Hep C   Headache(784.0)    Hypertension 2010   IUP (intrauterine pregnancy), incidental 13 weeks   Opiate dependence ([place])    stopped methadone 01/2011   PONV (postoperative nausea and vomiting)    STD (female)    HX of STD's    Patient Active Problem List   Diagnosis Date Noted   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   Substance induced mood disorder ([place]) 07/15/2020   Pica 05/21/2020   Prediabetes 05/21/2020   Alcohol use disorder, severe, dependence ([place]) 05/21/2020   Diarrhea 03/24/2020   Hypokalemia 03/24/2020   Iron deficiency anemia 03/24/2020   MDD (major depressive disorder), recurrent, severe, with psychosis ([place]) 03/23/2020   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[place])    Severe recurrent major depression without psychotic features ([place]) 03/22/2020   Adjustment disorder with mixed anxiety and depressed mood 01/10/2020   Polysubstance abuse ([place]) 01/10/2020   Gastroesophageal reflux disease 03/08/2017   Anxiety 03/08/2017   Anxiety and depression 10/10/2015   History of intravenous drug use in remission 09/07/2011   HCV infection    Essential hypertension  Bipolar disorder ([place])    Bicornuate uterus     Past Surgical History:  Procedure Laterality Date   BILATERAL SALPINGECTOMY  12/06/2012   Procedure: BILATERAL SALPINGECTOMY;  Surgeon: Mora Bellman, MD;  Location: [facility];  Service: Gynecology;  Laterality: Bilateral;  laparoscopic   CERVICAL CERCLAGE  09/01/2011   Procedure: CERCLAGE CERVICAL;  Surgeon: Donnamae Jude, MD;  Location: [facility];  Service: Gynecology;  Laterality: N/A;   CERVICAL CERCLAGE  06/28/2012   Procedure: CERCLAGE CERVICAL;  Surgeon: Donnamae Jude, MD;  Location: [facility];  Service:  Gynecology;  Laterality: N/A;   DILATION AND CURETTAGE OF UTERUS     SAB   FINGER SURGERY  2003   reattachment of right forefinger   THERAPEUTIC ABORTION     THERAPEUTIC ABORTION     TONSILLECTOMY     TUBAL LIGATION  11/23/2012    OB History     Gravida  7   Para  4   Term  2   Preterm  2   AB  3   Living  2      SAB  1   IAB  2   Ectopic  0   Multiple  0   Live Births  3            Home Medications    Prior to Admission medications   Medication Sig Start Date End Date Taking? Authorizing Provider  docusate sodium (COLACE) 100 MG capsule Take 1 capsule (100 mg total) by mouth every 12 (twelve) hours. 05/30/22  Yes Talbot Grumbling, FNP  megestrol (MEGACE) 40 MG tablet Take 1 tablet (40 mg total) by mouth daily. 05/30/22  Yes Talbot Grumbling, FNP  polyethylene glycol powder (MIRALAX) 17 GM/SCOOP powder Take 255 g by mouth once for 1 dose. 05/30/22 05/30/22 Yes Annet Manukyan, Stasia Cavalier, FNP  amitriptyline (ELAVIL) 50 MG tablet Take 1/2 tablet by mouth for 4 days. Then take 1 tablet by mouth every night at bedtime 10/23/21     amLODipine (NORVASC) 10 MG tablet TAKE 1 TABLET (10 MG TOTAL) BY MOUTH DAILY. 06/12/21 06/12/22  Argentina Donovan, PA-C  ARIPiprazole (ABILIFY) 10 MG tablet take 1 tablet by oral route  every day 03/25/21     ARIPiprazole (ABILIFY) 10 MG tablet Take 1 tablet by mouth every day 10/23/21     ARIPiprazole (ABILIFY) 15 MG tablet take 1 tablet by oral route  every day 11/10/21     ARIPiprazole (ABILIFY) 15 MG tablet Take 1 tablet by mouth every day 01/05/22     ARIPiprazole (ABILIFY) 15 MG tablet Take 1 tablet by mouth daily. 02/19/22     ARIPiprazole (ABILIFY) 20 MG tablet take 1 tablet by mouth every day 03/17/22     ARIPiprazole (ABILIFY) 30 MG tablet take 1 tablet by mouth daily 04/14/22     ARIPiprazole (ABILIFY) 30 MG tablet take 1 tablet by mouth daily 05/15/22     ARIPiprazole (ABILIFY) 5 MG tablet Take 1/2 tablet by mouth every day for 1 week.  Then take 1 tablet by mouth every day 06/02/21     atomoxetine (STRATTERA) 60 MG capsule TAKE 1 CAPSULE BY ORAL ROUTE EVERY DAY IN THE MORNING 12/25/20 12/25/21    buPROPion (WELLBUTRIN) 75 MG tablet Take 1 tablet by mouth every morning at 7am and 1 tablet at noon. 05/15/22     busPIRone (BUSPAR) 5 MG tablet take 1/2 tablet by mouth 3 times every day for 1 week. Then take 1  tablet 3 times each day. 03/25/21     clindamycin (CLEOCIN) 300 MG capsule Take 1 capsule (300 mg total) by mouth 3 (three) times daily. 03/25/22   Vanessa Kick, MD  ferrous sulfate 325 (65 FE) MG tablet Take 1 tablet (325 mg total) by mouth every other day. 05/30/22   Talbot Grumbling, FNP  folic acid (FOLVITE) 1 MG tablet Take 1 tablet (1 mg total) by mouth daily. 07/23/20   Sharma Covert, MD  gabapentin (NEURONTIN) 300 MG capsule TAKE 1 CAPSULE BY ORAL ROUTE 3 TIMES EVERY DAY 12/25/20 12/25/21    hydrochlorothiazide (HYDRODIURIL) 25 MG tablet Take 1 tablet (25 mg total) by mouth daily. 06/12/21 06/12/22  Argentina Donovan, PA-C  hydrOXYzine (ATARAX) 50 MG tablet Take 1 or 2 tablet by mouth every night at bedtime. 10/23/21     ibuprofen (ADVIL) 600 MG tablet Take 1 tablet (600 mg total) by mouth every 6 (six) hours as needed for headache. 04/14/22   Talbot Grumbling, FNP  lisinopril (ZESTRIL) 20 MG tablet Take 1 tablet (20 mg total) by mouth daily. 06/12/21   Argentina Donovan, PA-C  OLANZapine (ZYPREXA) 5 MG tablet TAKE 1 TABLET BY MOUTH EVERY DAY 11/28/20 11/28/21    omeprazole (PRILOSEC) 40 MG capsule TAKE 1 CAPSULE (40 MG TOTAL) BY MOUTH DAILY. 01/29/22 01/29/23  Ladell Pier, MD  ondansetron (ZOFRAN ODT) 4 MG disintegrating tablet Take 1 tablet (4 mg total) by mouth every 8 (eight) hours as needed for nausea or vomiting. 09/10/21   Pearson Forster, NP  promethazine (PHENERGAN) 25 MG tablet Take 1 tablet (25 mg total) by mouth every 6 (six) hours as needed for nausea or vomiting. 09/15/21   White, Leitha Schuller, NP  senna-docusate  (SENOKOT-S) 8.6-50 MG tablet Take 1 tablet by mouth daily as needed for mild constipation. 06/05/20   Charlott Rakes, MD  SUMAtriptan (IMITREX) 50 MG tablet take 1 tablet by mouth at the start of headache as needed. (May repeat in 2 hrs if no relief.  Max 2 tabs/24 hr) 02/20/22   Ladell Pier, MD  thiamine 100 MG tablet Take 1 tablet (100 mg total) by mouth daily. 07/23/20   Sharma Covert, MD  losartan-hydrochlorothiazide (HYZAAR) 100-25 MG tablet Take 1 tablet by mouth daily. 06/10/19 01/02/20  Vanessa Kick, MD  pantoprazole (PROTONIX) 40 MG tablet Take 1 tablet (40 mg total) by mouth daily. 08/15/20 09/05/20  Charlott Rakes, MD  prazosin (MINIPRESS) 1 MG capsule Take 1 capsule by mouth every night at bedtime. 07/01/21 08/06/21    venlafaxine XR (EFFEXOR-XR) 37.5 MG 24 hr capsule Take 1 capsule by mouth every day with food 07/01/21 08/06/21      Family History Family History  Problem Relation Age of Onset   Hypertension Mother    Mental illness Mother        depression; alot of mental illness.   Cancer Father 70       Colon cancer age 50   Thyroid disease Maternal Grandmother    Diabetes Paternal Grandfather    Anesthesia problems Neg Hx    Other Neg Hx     Social History Social History   Tobacco Use   Smoking status: Every Day    Packs/day: 0.50    Years: 20.00    Total pack years: 10.00    Types: Cigarettes    Start date: 05/04/2014   Smokeless tobacco: Never   Tobacco comments:    using the vapor only  Vaping Use   Vaping Use: Some days   Substances: Nicotine  Substance Use Topics   Alcohol use: Yes    Comment: at least 6 shots per day most days; Last drink last  Tuesday 07/09/20   Drug use: Not Currently    Types: Cocaine, IV, Marijuana, Heroin, Methamphetamines, Other-see comments    Comment: prescription pain meds     Allergies   Keflex [cephalexin] and Hydrocodone-acetaminophen   Review of Systems Review of Systems  Neurological:  Positive for headaches.   Per HPI   Physical Exam Triage Vital Signs ED Triage Vitals  Enc Vitals Group     BP 05/30/22 1015 129/85     Pulse Rate 05/30/22 1015 99     Resp 05/30/22 1015 18     Temp --      Temp src --      SpO2 05/30/22 1015 98 %     Weight --      Height --      Head Circumference --      Peak Flow --      Pain Score 05/30/22 1014 0     Pain Loc --      Pain Edu? --      Excl. in [place]? --    No data found.  Updated Vital Signs BP 129/85   Pulse 99   Resp 18   SpO2 98%   Visual Acuity Right Eye Distance:   Left Eye Distance:   Bilateral Distance:    Right Eye Near:   Left Eye Near:    Bilateral Near:     Physical Exam Vitals and nursing note reviewed.  Constitutional:      Appearance: Normal appearance. She is not ill-appearing, toxic-appearing or diaphoretic.     Comments: Very pleasant patient sitting on exam in position of comfort table in no acute distress.   HENT:     Head: Normocephalic and atraumatic.     Right Ear: Hearing, tympanic membrane, ear canal and external ear normal.     Left Ear: Hearing, tympanic membrane, ear canal and external ear normal.     Nose: Nose normal.     Mouth/Throat:     Lips: Pink.     Mouth: Mucous membranes are moist.  Eyes:     General: Lids are normal. Vision grossly intact. Gaze aligned appropriately.        Right eye: No discharge.        Left eye: No discharge.     Extraocular Movements: Extraocular movements intact.     Conjunctiva/sclera: Conjunctivae normal.     Pupils: Pupils are equal, round, and reactive to light.  Cardiovascular:     Rate and Rhythm: Normal rate and regular rhythm.     Heart sounds: Normal heart sounds, S1 normal and S2 normal.  Pulmonary:     Effort: Pulmonary effort is normal. No respiratory distress.     Breath sounds: Normal breath sounds and air entry.     Comments: No adventitious lung sounds heard to auscultation.  No acute respiratory distress at this time. Abdominal:     General:  Bowel sounds are normal.     Palpations: Abdomen is soft.     Tenderness: There is no abdominal tenderness. There is no right CVA tenderness, left CVA tenderness or guarding.  Musculoskeletal:     Cervical back: Neck supple.  Lymphadenopathy:     Cervical: No cervical adenopathy.  Skin:    General: Skin is warm  and dry.     Capillary Refill: Capillary refill takes less than 2 seconds.     Coloration: Skin is pale.     Findings: No rash.     Comments: Slightly pale skin.  Mucosa of bilateral lower eyelids is also slightly pale.   Neurological:     General: No focal deficit present.     Mental Status: She is alert and oriented to person, place, and time. Mental status is at baseline.     Cranial Nerves: No dysarthria or facial asymmetry.     Motor: No weakness.     Gait: Gait is intact. Gait normal.  Psychiatric:        Mood and Affect: Mood normal.        Speech: Speech normal.        Behavior: Behavior normal.        Thought Content: Thought content normal.        Judgment: Judgment normal.      UC Treatments / Results  Labs (all labs ordered are listed, but only abnormal results are displayed) Labs Reviewed - No data to display  EKG   Radiology No results found.  Procedures Procedures (including critical care time)  Medications Ordered in UC Medications - No data to display  Initial Impression / Assessment and Plan / UC Course  I have reviewed the triage vital signs and the nursing notes.  Pertinent labs & imaging results that were available during my care of the patient were reviewed by me and considered in my medical decision making (see chart for details).  1.  Iron deficiency anemia Upon review of patient's past visits, patient has attempted use of ferrous sulfate in the past for iron deficiency anemia.  Last hemoglobin was  *** Final Clinical Impressions(s) / UC Diagnoses   Final diagnoses:  Iron deficiency anemia, unspecified iron deficiency anemia  type  Vaginal bleeding  Nausea     Discharge Instructions      Take Megace once daily for the next 10 days to stop your vaginal bleeding.  This may cause a withdrawal bleed as discussed in the clinic.  This will help reduce the blood loss that I believe is causing your symptoms and possible worsening iron deficiency anemia.  I would like for you to start taking ferrous sulfate (iron) every other day with orange juice.  Take Colace daily to soften your stools to prevent constipation while taking iron pills.  You may use MiraLAX 1-2 times daily temporarily as needed for constipation.   You may continue use of Tylenol over-the-counter for your headache.  Continue using Zofran as needed for your nausea from home supply.      ED Prescriptions     Medication Sig Dispense Auth. Provider   ferrous sulfate 325 (65 FE) MG tablet  (Status: Discontinued) Take 1 tablet (325 mg total) by mouth daily. 30 tablet Joella Prince M, FNP   megestrol (MEGACE) 40 MG tablet Take 1 tablet (40 mg total) by mouth daily. 10 tablet Talbot Grumbling, FNP   docusate sodium (COLACE) 100 MG capsule Take 1 capsule (100 mg total) by mouth every 12 (twelve) hours. 60 capsule Talbot Grumbling, FNP   polyethylene glycol powder (MIRALAX) 17 GM/SCOOP powder Take 255 g by mouth once for 1 dose. 255 g Joella Prince M, FNP   ferrous sulfate 325 (65 FE) MG tablet Take 1 tablet (325 mg total) by mouth every other day. 30 tablet Talbot Grumbling, FNP  PDMP not reviewed this encounter.

## 2022-05-30 NOTE — Discharge Instructions (Addendum)
Take Megace once daily for the next 10 days to stop your vaginal bleeding.  This may cause a withdrawal bleed as discussed in the clinic.  This will help reduce the blood loss that I believe is causing your symptoms and possible worsening iron deficiency anemia.  I would like for you to start taking ferrous sulfate (iron) every other day with orange juice.  Take Colace daily to soften your stools to prevent constipation while taking iron pills.  You may use MiraLAX 1-2 times daily temporarily as needed for constipation.   You may continue use of Tylenol over-the-counter for your headache.  Continue using Zofran as needed for your nausea from home supply.

## 2022-05-30 NOTE — ED Notes (Signed)
Pt refused blood work. Provider aware.

## 2022-06-01 ENCOUNTER — Other Ambulatory Visit: Payer: Self-pay

## 2022-06-02 ENCOUNTER — Other Ambulatory Visit: Payer: Self-pay

## 2022-06-02 MED ORDER — ARIPIPRAZOLE 30 MG PO TABS
ORAL_TABLET | ORAL | 0 refills | Status: DC
  Filled 2022-06-02: qty 30, 30d supply, fill #0

## 2022-06-08 ENCOUNTER — Other Ambulatory Visit: Payer: Self-pay

## 2022-07-21 ENCOUNTER — Other Ambulatory Visit: Payer: Self-pay

## 2022-07-21 ENCOUNTER — Other Ambulatory Visit: Payer: Self-pay | Admitting: Physician Assistant

## 2022-07-21 DIAGNOSIS — I10 Essential (primary) hypertension: Secondary | ICD-10-CM

## 2022-07-21 MED ORDER — LISINOPRIL 20 MG PO TABS
20.0000 mg | ORAL_TABLET | Freq: Every day | ORAL | 0 refills | Status: DC
  Filled 2022-07-21: qty 90, 90d supply, fill #0

## 2022-07-21 MED ORDER — ARIPIPRAZOLE 30 MG PO TABS
ORAL_TABLET | ORAL | 1 refills | Status: DC
  Filled 2022-07-21: qty 30, 30d supply, fill #0

## 2022-07-21 MED ORDER — BUPROPION HCL ER (SR) 100 MG PO TB12
ORAL_TABLET | ORAL | 1 refills | Status: DC
  Filled 2022-07-21 – 2022-07-31 (×2): qty 60, 30d supply, fill #0

## 2022-07-21 NOTE — Telephone Encounter (Signed)
Requested Prescriptions  Pending Prescriptions Disp Refills  . lisinopril (ZESTRIL) 20 MG tablet 90 tablet 1    Sig: Take 1 tablet (20 mg total) by mouth daily.     Cardiovascular:  ACE Inhibitors Failed - 07/21/2022  1:48 PM      Failed - Cr in normal range and within 180 days    Creat  Date Value Ref Range Status  09/04/2015 0.66 0.50 - 1.10 mg/dL Final   Creatinine, Ser  Date Value Ref Range Status  09/15/2021 0.90 0.44 - 1.00 mg/dL Final         Failed - K in normal range and within 180 days    Potassium  Date Value Ref Range Status  09/15/2021 3.9 3.5 - 5.1 mmol/L Final         Passed - Patient is not pregnant      Passed - Last BP in normal range    BP Readings from Last 1 Encounters:  05/30/22 129/85         Passed - Valid encounter within last 6 months    Recent Outpatient Visits          5 months ago Acute flank pain   [place], MD   7 months ago Migraine with aura and without status migrainosus, not intractable   [place], Vernia Buff, NP   1 year ago Hyperglycemia   [place], [place]   1 year ago [place], [place], [place]   2 years ago Severe recurrent major depression without psychotic features [facility])   [place], MD      Future Appointments            In 1 week Ladell Pier, MD [place]

## 2022-07-22 ENCOUNTER — Other Ambulatory Visit: Payer: Self-pay

## 2022-07-22 MED ORDER — HYDROCHLOROTHIAZIDE 25 MG PO TABS
25.0000 mg | ORAL_TABLET | Freq: Every day | ORAL | 0 refills | Status: DC
  Filled 2022-07-22: qty 30, 30d supply, fill #0

## 2022-07-22 MED ORDER — AMLODIPINE BESYLATE 10 MG PO TABS
ORAL_TABLET | Freq: Every day | ORAL | 0 refills | Status: DC
  Filled 2022-07-22: qty 30, 30d supply, fill #0

## 2022-07-22 NOTE — Telephone Encounter (Signed)
Requested medications are due for refill today.  yes  Requested medications are on the active medications list.  yes  Last refill. 06/12/2021 #90 1 refill for both  Future visit scheduled.   yes  Notes to clinic.  Rxs written to expired 06/12/2022 - Both Rxs are expired.    Requested Prescriptions  Pending Prescriptions Disp Refills   amLODipine (NORVASC) 10 MG tablet 90 tablet 1    Sig: TAKE 1 TABLET (10 MG TOTAL) BY MOUTH DAILY.     Cardiovascular: Calcium Channel Blockers 2 Passed - 07/21/2022  1:47 PM      Passed - Last BP in normal range    BP Readings from Last 1 Encounters:  05/30/22 129/85         Passed - Last Heart Rate in normal range    Pulse Readings from Last 1 Encounters:  05/30/22 99         Passed - Valid encounter within last 6 months    Recent Outpatient Visits           5 months ago Acute flank pain   [place], Neoma Laming B, MD   7 months ago Migraine with aura and without status migrainosus, not intractable   [place], Vernia Buff, NP   1 year ago Hyperglycemia   [place], Dionne [place], [place]   1 year ago [place], [place], [place]   2 years ago Severe recurrent major depression without psychotic features [facility])   [place], MD       Future Appointments             In 1 week Ladell Pier, MD [place]             hydrochlorothiazide (HYDRODIURIL) 25 MG tablet 90 tablet 1    Sig: Take 1 tablet (25 mg total) by mouth daily.     Cardiovascular: Diuretics - Thiazide Failed - 07/21/2022  1:47 PM      Failed - Cr in normal range and within 180 days    Creat  Date Value Ref Range Status  09/04/2015 0.66 0.50 - 1.10 mg/dL Final   Creatinine, Ser  Date Value Ref Range Status  09/15/2021 0.90  0.44 - 1.00 mg/dL Final         Failed - K in normal range and within 180 days    Potassium  Date Value Ref Range Status  09/15/2021 3.9 3.5 - 5.1 mmol/L Final         Failed - Na in normal range and within 180 days    Sodium  Date Value Ref Range Status  09/15/2021 137 135 - 145 mmol/L Final  06/12/2021 136 134 - 144 mmol/L Final         Passed - Last BP in normal range    BP Readings from Last 1 Encounters:  05/30/22 129/85         Passed - Valid encounter within last 6 months    Recent Outpatient Visits           5 months ago Acute flank pain   [place], MD   7 months ago Migraine with aura and without status migrainosus, not intractable   [place],  Vernia Buff, NP   1 year ago Hyperglycemia   [place], [place]   1 year ago [place], [place], [place]   2 years ago Severe recurrent major depression without psychotic features [facility])   [place], MD       Future Appointments             In 1 week Ladell Pier, MD [place]

## 2022-07-23 ENCOUNTER — Ambulatory Visit (HOSPITAL_COMMUNITY)
Admission: EM | Admit: 2022-07-23 | Discharge: 2022-07-23 | Disposition: A | Discharge status: Home / Self Care | Payer: Self-pay | Attending: Family Medicine | Admitting: Family Medicine

## 2022-07-23 ENCOUNTER — Encounter (HOSPITAL_COMMUNITY): Payer: Self-pay | Admitting: Emergency Medicine

## 2022-07-23 DIAGNOSIS — R110 Nausea: Secondary | ICD-10-CM | POA: Insufficient documentation

## 2022-07-23 DIAGNOSIS — Z20822 Contact with and (suspected) exposure to covid-19: Secondary | ICD-10-CM | POA: Insufficient documentation

## 2022-07-23 DIAGNOSIS — R52 Pain, unspecified: Secondary | ICD-10-CM | POA: Insufficient documentation

## 2022-07-23 DIAGNOSIS — J069 Acute upper respiratory infection, unspecified: Secondary | ICD-10-CM | POA: Insufficient documentation

## 2022-07-23 LAB — SARS CORONAVIRUS 2 BY RT PCR: SARS Coronavirus 2 by RT PCR: NEGATIVE

## 2022-07-23 NOTE — Discharge Instructions (Signed)
You were seen for a viral illness today.  You were tested for covid 19 and this should be resulted later today.  If positive, we will call you and discuss treatment options.  In the mean time I recommend tylenol or motrin for pain.  You may take sudafed or an antihistamine for sinus congestion and drainage.  Please get plenty of rest and fluids as well.

## 2022-07-23 NOTE — ED Triage Notes (Signed)
Since last night having congestion, body aches and some nausea. Left work today due to feeling bad. Works at retirement home.

## 2022-07-23 NOTE — ED Provider Notes (Signed)
[facility]    CSN: [id number] Arrival date & time: 07/23/22  9937      History   Chief Complaint Chief Complaint  Patient presents with   Generalized Body Aches   Nasal Congestion    HPI [name] is a 46 y.o. female.   Patient is here for not feeling well.  Started last evening with body aches, headache, congestion.  No fever that she knows of.  No cough.  No vomiting, but feeling nauseated . She did take zofran this morning but still with body aches.  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   Past Medical History:  Diagnosis Date   Abnormal Pap smear 1998   had colpo   Anxiety    Asthma    ,as child only   no inhaler   Bicornate uterus    Bicornuate uterus    Chlamydia 2000   GERD (gastroesophageal reflux disease)    HCV infection    HX of Hep C   Headache(784.0)    Hypertension 2010   IUP (intrauterine pregnancy), incidental 13 weeks   Opiate dependence ([place])    stopped methadone 01/2011   PONV (postoperative nausea and vomiting)    STD (female)    HX of STD's    Patient Active Problem List   Diagnosis Date Noted   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   Substance induced mood disorder ([place]) 07/15/2020   Pica 05/21/2020   Prediabetes 05/21/2020   Alcohol use disorder, severe, dependence ([place]) 05/21/2020   Diarrhea 03/24/2020   Hypokalemia 03/24/2020   Iron deficiency anemia 03/24/2020   MDD (major depressive disorder), recurrent, severe, 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/2020   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[place])    Severe recurrent major depression without psychotic features ([place]) 03/22/2020   Adjustment disorder with mixed anxiety and depressed mood 01/10/2020   Polysubstance abuse ([place]) 01/10/2020   Gastroesophageal reflux disease 03/08/2017   Anxiety 03/08/2017   Anxiety and depression 10/10/2015   History of intravenous drug use in remission 09/07/2011   HCV infection    Essential hypertension    Bipolar disorder ([place])     Bicornuate uterus     Past Surgical History:  Procedure Laterality Date   BILATERAL SALPINGECTOMY  12/06/2012   Procedure: BILATERAL SALPINGECTOMY;  Surgeon: Mora Bellman, MD;  Location: [facility];  Service: Gynecology;  Laterality: Bilateral;  laparoscopic   CERVICAL CERCLAGE  09/01/2011   Procedure: CERCLAGE CERVICAL;  Surgeon: Donnamae Jude, MD;  Location: [facility];  Service: Gynecology;  Laterality: N/A;   CERVICAL CERCLAGE  06/28/2012   Procedure: CERCLAGE CERVICAL;  Surgeon: Donnamae Jude, MD;  Location: [facility];  Service: Gynecology;  Laterality: N/A;   DILATION AND CURETTAGE OF UTERUS     SAB   FINGER SURGERY  2003   reattachment of right forefinger   THERAPEUTIC ABORTION     THERAPEUTIC ABORTION     TONSILLECTOMY     TUBAL LIGATION  11/23/2012    OB History     Gravida  7   Para  4   Term  2   Preterm  2   AB  3   Living  2      SAB  1   IAB  2   Ectopic  0   Multiple  0   Live Births  3            Home Medications    Prior to Admission medications   Medication Sig  Start Date End Date Taking? Authorizing Provider  amitriptyline (ELAVIL) 50 MG tablet Take 1/2 tablet by mouth for 4 days. Then take 1 tablet by mouth every night at bedtime 10/23/21     amLODipine (NORVASC) 10 MG tablet TAKE 1 TABLET (10 MG TOTAL) BY MOUTH DAILY. Please make PCP appointment for more refills. 07/22/22   Ladell Pier, MD  ARIPiprazole (ABILIFY) 10 MG tablet take 1 tablet by oral route  every day 03/25/21     ARIPiprazole (ABILIFY) 10 MG tablet Take 1 tablet by mouth every day 10/23/21     ARIPiprazole (ABILIFY) 15 MG tablet take 1 tablet by oral route  every day 11/10/21     ARIPiprazole (ABILIFY) 15 MG tablet Take 1 tablet by mouth every day 01/05/22     ARIPiprazole (ABILIFY) 15 MG tablet Take 1 tablet by mouth daily. 02/19/22     ARIPiprazole (ABILIFY) 20 MG tablet take 1 tablet by mouth every day 03/17/22     ARIPiprazole (ABILIFY) 30 MG tablet take 1 tablet by mouth daily  04/14/22     ARIPiprazole (ABILIFY) 30 MG tablet take 1 tablet by mouth daily 05/15/22     ARIPiprazole (ABILIFY) 30 MG tablet take 1 tablet by mouth every day 06/02/22     ARIPiprazole (ABILIFY) 30 MG tablet Take 1 tablet by mouth daily. 07/21/22     ARIPiprazole (ABILIFY) 5 MG tablet Take 1/2 tablet by mouth every day for 1 week. Then take 1 tablet by mouth every day 06/02/21     atomoxetine (STRATTERA) 60 MG capsule TAKE 1 CAPSULE BY ORAL ROUTE EVERY DAY IN THE MORNING 12/25/20 12/25/21    buPROPion ER (WELLBUTRIN SR) 100 MG 12 hr tablet Take 1 tablet by mouth every morning for 1 week. Then take 1 tablet twice each day. 07/21/22     busPIRone (BUSPAR) 5 MG tablet take 1/2 tablet by mouth 3 times every day for 1 week. Then take 1 tablet 3 times each day. 03/25/21     clindamycin (CLEOCIN) 300 MG capsule Take 1 capsule (300 mg total) by mouth 3 (three) times daily. 03/25/22   Vanessa Kick, MD  docusate sodium (COLACE) 100 MG capsule Take 1 capsule (100 mg total) by mouth every 12 (twelve) hours. 05/30/22   Talbot Grumbling, FNP  ferrous sulfate 325 (65 FE) MG tablet Take 1 tablet (325 mg total) by mouth every other day. 05/30/22   Talbot Grumbling, FNP  folic acid (FOLVITE) 1 MG tablet Take 1 tablet (1 mg total) by mouth daily. 07/23/20   Sharma Covert, MD  gabapentin (NEURONTIN) 300 MG capsule TAKE 1 CAPSULE BY ORAL ROUTE 3 TIMES EVERY DAY 12/25/20 12/25/21    hydrochlorothiazide (HYDRODIURIL) 25 MG tablet Take 1 tablet (25 mg total) by mouth daily. 07/22/22   Ladell Pier, MD  hydrOXYzine (ATARAX) 50 MG tablet Take 1 or 2 tablet by mouth every night at bedtime. 10/23/21     ibuprofen (ADVIL) 600 MG tablet Take 1 tablet (600 mg total) by mouth every 6 (six) hours as needed for headache. 04/14/22   Talbot Grumbling, FNP  lisinopril (ZESTRIL) 20 MG tablet Take 1 tablet (20 mg total) by mouth daily. 07/21/22   Ladell Pier, MD  megestrol (MEGACE) 40 MG tablet Take 1 tablet (40 mg total) by  mouth daily. 05/30/22   Talbot Grumbling, FNP  OLANZapine (ZYPREXA) 5 MG tablet TAKE 1 TABLET BY MOUTH EVERY DAY 11/28/20 11/28/21    omeprazole (  PRILOSEC) 40 MG capsule TAKE 1 CAPSULE (40 MG TOTAL) BY MOUTH DAILY. 01/29/22 01/29/23  Ladell Pier, MD  ondansetron (ZOFRAN ODT) 4 MG disintegrating tablet Take 1 tablet (4 mg total) by mouth every 8 (eight) hours as needed for nausea or vomiting. 09/10/21   Pearson Forster, NP  promethazine (PHENERGAN) 25 MG tablet Take 1 tablet (25 mg total) by mouth every 6 (six) hours as needed for nausea or vomiting. 09/15/21   White, Leitha Schuller, NP  senna-docusate (SENOKOT-S) 8.6-50 MG tablet Take 1 tablet by mouth daily as needed for mild constipation. 06/05/20   Charlott Rakes, MD  SUMAtriptan (IMITREX) 50 MG tablet take 1 tablet by mouth at the start of headache as needed. (May repeat in 2 hrs if no relief.  Max 2 tabs/24 hr) 02/20/22   Ladell Pier, MD  thiamine 100 MG tablet Take 1 tablet (100 mg total) by mouth daily. 07/23/20   Sharma Covert, MD  losartan-hydrochlorothiazide (HYZAAR) 100-25 MG tablet Take 1 tablet by mouth daily. 06/10/19 01/02/20  Vanessa Kick, MD  pantoprazole (PROTONIX) 40 MG tablet Take 1 tablet (40 mg total) by mouth daily. 08/15/20 09/05/20  Charlott Rakes, MD  prazosin (MINIPRESS) 1 MG capsule Take 1 capsule by mouth every night at bedtime. 07/01/21 08/06/21    venlafaxine XR (EFFEXOR-XR) 37.5 MG 24 hr capsule Take 1 capsule by mouth every day with food 07/01/21 08/06/21      Family History Family History  Problem Relation Age of Onset   Hypertension Mother    Mental illness Mother        depression; alot of mental illness.   Cancer Father 39       Colon cancer age 80   Thyroid disease Maternal Grandmother    Diabetes Paternal Grandfather    Anesthesia problems Neg Hx    Other Neg Hx     Social History Social History   Tobacco Use   Smoking status: Every Day    Packs/day: 0.50    Years: 20.00    Total pack years:  10.00    Types: Cigarettes    Start date: 05/04/2014   Smokeless tobacco: Never   Tobacco comments:    using the vapor only  Vaping Use   Vaping Use: Some days   Substances: Nicotine  Substance Use Topics   Alcohol use: Yes    Comment: at least 6 shots per day most days; Last drink last  Tuesday 07/09/20   Drug use: Not Currently    Types: Cocaine, IV, Marijuana, Heroin, Methamphetamines, Other-see comments    Comment: prescription pain meds     Allergies   Keflex [cephalexin] and Hydrocodone-acetaminophen   Review of Systems Review of Systems  Constitutional:  Positive for chills and fatigue. Negative for fever.  HENT:  Positive for congestion and rhinorrhea.   Respiratory: Negative.    Cardiovascular: Negative.   Gastrointestinal:  Positive for nausea. Negative for vomiting.  Musculoskeletal:  Positive for arthralgias.  Skin: Negative.   Psychiatric/Behavioral: Negative.       Physical Exam Triage Vital Signs ED Triage Vitals  Enc Vitals Group     BP 07/23/22 0951 (!) 148/101     Pulse Rate 07/23/22 0951 81     Resp 07/23/22 0951 16     Temp 07/23/22 0951 97.8 F (36.6 C)     Temp Source 07/23/22 0951 Oral     SpO2 07/23/22 0951 99 %     Weight --  Height --      Head Circumference --      Peak Flow --      Pain Score 07/23/22 0950 3     Pain Loc --      Pain Edu? --      Excl. in [place]? --    No data found.  Updated Vital Signs BP (!) 148/101 (BP Location: Right Arm)   Pulse 81   Temp 97.8 F (36.6 C) (Oral)   Resp 16   LMP 06/23/2022   SpO2 99%   Visual Acuity Right Eye Distance:   Left Eye Distance:   Bilateral Distance:    Right Eye Near:   Left Eye Near:    Bilateral Near:     Physical Exam Constitutional:      Appearance: Normal appearance.  HENT:     Head: Normocephalic and atraumatic.     Nose: Rhinorrhea present.  Cardiovascular:     Rate and Rhythm: Normal rate and regular rhythm.  Pulmonary:     Effort: Pulmonary effort  is normal.     Breath sounds: Normal breath sounds.  Abdominal:     Palpations: Abdomen is soft.     Tenderness: There is no abdominal tenderness.  Musculoskeletal:     Cervical back: Normal range of motion and neck supple.  Neurological:     General: No focal deficit present.     Mental Status: She is alert.  Psychiatric:        Mood and Affect: Mood normal.      UC Treatments / Results  Labs (all labs ordered are listed, but only abnormal results are displayed) Labs Reviewed  SARS CORONAVIRUS 2 BY RT PCR    EKG   Radiology No results found.  Procedures Procedures (including critical care time)  Medications Ordered in UC Medications - No data to display  Initial Impression / Assessment and Plan / UC Course  I have reviewed the triage vital signs and the nursing notes.  Pertinent labs & imaging results that were available during my care of the patient were reviewed by me and considered in my medical decision making (see chart for details).  Patient was seen today for body aches and congestion.  She works in a nursing facility.  I have tested her for covid today.  If positive, then would consider treatment with molnupiravir  Final Clinical Impressions(s) / UC Diagnoses   Final diagnoses:  Upper respiratory tract infection, unspecified type  Nausea without vomiting  Body aches     Discharge Instructions      You were seen for a viral illness today.  You were tested for covid 19 and this should be resulted later today.  If positive, we will call you and discuss treatment options.  In the mean time I recommend tylenol or motrin for pain.  You may take sudafed or an antihistamine for sinus congestion and drainage.  Please get plenty of rest and fluids as well.     ED Prescriptions   None    PDMP not reviewed this encounter.   Rondel Oh, MD 07/23/22 1010

## 2022-07-28 ENCOUNTER — Other Ambulatory Visit: Payer: Self-pay

## 2022-07-29 ENCOUNTER — Other Ambulatory Visit: Payer: Self-pay

## 2022-07-30 ENCOUNTER — Other Ambulatory Visit: Payer: Self-pay

## 2022-07-30 ENCOUNTER — Ambulatory Visit: Payer: Medicaid Other | Attending: Internal Medicine | Admitting: Internal Medicine

## 2022-07-30 ENCOUNTER — Encounter: Payer: Self-pay | Admitting: Internal Medicine

## 2022-07-30 VITALS — BP 150/90 | HR 103 | Temp 98.1°F | Ht 62.0 in | Wt 165.2 lb

## 2022-07-30 DIAGNOSIS — M722 Plantar fascial fibromatosis: Secondary | ICD-10-CM

## 2022-07-30 DIAGNOSIS — F17200 Nicotine dependence, unspecified, uncomplicated: Secondary | ICD-10-CM

## 2022-07-30 DIAGNOSIS — F3170 Bipolar disorder, currently in remission, most recent episode unspecified: Secondary | ICD-10-CM

## 2022-07-30 DIAGNOSIS — Z2821 Immunization not carried out because of patient refusal: Secondary | ICD-10-CM

## 2022-07-30 DIAGNOSIS — F17210 Nicotine dependence, cigarettes, uncomplicated: Secondary | ICD-10-CM

## 2022-07-30 DIAGNOSIS — I10 Essential (primary) hypertension: Secondary | ICD-10-CM

## 2022-07-30 DIAGNOSIS — D508 Other iron deficiency anemias: Secondary | ICD-10-CM

## 2022-07-30 DIAGNOSIS — N760 Acute vaginitis: Secondary | ICD-10-CM

## 2022-07-30 DIAGNOSIS — Z8619 Personal history of other infectious and parasitic diseases: Secondary | ICD-10-CM

## 2022-07-30 MED ORDER — OMEPRAZOLE 40 MG PO CPDR
DELAYED_RELEASE_CAPSULE | Freq: Every day | ORAL | 1 refills | Status: DC
  Filled 2022-07-30: qty 60, 60d supply, fill #0

## 2022-07-30 MED ORDER — FLUCONAZOLE 150 MG PO TABS
150.0000 mg | ORAL_TABLET | Freq: Every day | ORAL | 0 refills | Status: DC
  Filled 2022-07-30: qty 1, 1d supply, fill #0

## 2022-07-30 MED ORDER — HYDROCHLOROTHIAZIDE 25 MG PO TABS
25.0000 mg | ORAL_TABLET | Freq: Every day | ORAL | 1 refills | Status: DC
  Filled 2022-07-30: qty 90, 90d supply, fill #0
  Filled 2023-01-01: qty 90, 90d supply, fill #1

## 2022-07-30 MED ORDER — AMLODIPINE BESYLATE 10 MG PO TABS
10.0000 mg | ORAL_TABLET | Freq: Every day | ORAL | 1 refills | Status: DC
  Filled 2022-07-30: qty 90, 90d supply, fill #0
  Filled 2023-01-01: qty 90, 90d supply, fill #1

## 2022-07-30 MED ORDER — LISINOPRIL 20 MG PO TABS
20.0000 mg | ORAL_TABLET | Freq: Every day | ORAL | 1 refills | Status: DC
  Filled 2022-07-30: qty 90, 90d supply, fill #0
  Filled 2023-01-01 – 2023-01-20 (×2): qty 90, 90d supply, fill #1
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
## 2022-07-30 NOTE — Progress Notes (Signed)
Pt requesting Rx for Yeast infection.  Pt states sx's of vagina itching.

## 2022-07-31 ENCOUNTER — Other Ambulatory Visit: Payer: Self-pay

## 2022-08-04 ENCOUNTER — Other Ambulatory Visit: Payer: Self-pay

## 2022-08-04 MED ORDER — LAMOTRIGINE 25 MG PO TABS
25.0000 mg | ORAL_TABLET | Freq: Every day | ORAL | 0 refills | Status: DC
  Filled 2022-08-04: qty 46, 30d supply, fill #0

## 2022-08-08 ENCOUNTER — Encounter (HOSPITAL_COMMUNITY): Payer: Self-pay

## 2022-08-08 ENCOUNTER — Ambulatory Visit (HOSPITAL_COMMUNITY)
Admission: EM | Admit: 2022-08-08 | Discharge: 2022-08-08 | Disposition: A | Discharge status: Home / Self Care | Payer: Medicaid Other | Attending: Physician Assistant | Admitting: Physician Assistant

## 2022-08-08 DIAGNOSIS — K047 Periapical abscess without sinus: Secondary | ICD-10-CM

## 2022-08-08 DIAGNOSIS — K0889 Other specified disorders of teeth and supporting structures: Secondary | ICD-10-CM

## 2022-08-08 MED ORDER — CLINDAMYCIN HCL 300 MG PO CAPS
300.0000 mg | ORAL_CAPSULE | Freq: Three times a day (TID) | ORAL | 0 refills | Status: DC
  Filled 2022-08-08: qty 30, 10d supply, fill #0

## 2022-08-08 NOTE — ED Provider Notes (Signed)
[facility]    CSN: [id number] Arrival date & time: 08/08/22  1005      History   Chief Complaint Chief Complaint  Patient presents with   Dental Pain   Abscess    HPI [name] is a 46 y.o. female.   46 year old female presents with right lower tooth abscess.  Patient indicates for the past several days she has been having right lower tooth pain, tenderness, and swelling.  She indicates that the tooth is broken off down to the gum.  She indicates she is having pain and swelling without fever or chills.  She indicates minimal relief with OTC medications.   Dental Pain Abscess   Past Medical History:  Diagnosis Date   Abnormal Pap smear 1998   had colpo   Anxiety    Asthma    ,as child only   no inhaler   Bicornate uterus    Bicornuate uterus    Chlamydia 2000   GERD (gastroesophageal reflux disease)    HCV infection    HX of Hep C   Headache(784.0)    Hypertension 2010   IUP (intrauterine pregnancy), incidental 13 weeks   Opiate dependence ([place])    stopped methadone 01/2011   PONV (postoperative nausea and vomiting)    STD (female)    HX of STD's    Patient Active Problem List   Diagnosis Date Noted   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   Substance induced mood disorder ([place]) 07/15/2020   Pica 05/21/2020   Prediabetes 05/21/2020   Alcohol use disorder, severe, dependence ([place]) 05/21/2020   Diarrhea 03/24/2020   Hypokalemia 03/24/2020   Iron deficiency anemia 03/24/2020   MDD (major depressive disorder), recurrent, severe, with psychosis ([place]) 03/23/2020   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[place])    Severe recurrent major depression without psychotic features ([place]) 03/22/2020   Adjustment disorder with mixed anxiety and depressed mood 01/10/2020   Polysubstance abuse ([place]) 01/10/2020   Gastroesophageal reflux disease 03/08/2017   Anxiety 03/08/2017   Anxiety and depression 10/10/2015   History of intravenous drug use in remission 09/07/2011   HCV  infection    Essential hypertension    Bipolar disorder ([place])    Bicornuate uterus     Past Surgical History:  Procedure Laterality Date   BILATERAL SALPINGECTOMY  12/06/2012   Procedure: BILATERAL SALPINGECTOMY;  Surgeon: Mora Bellman, MD;  Location: [facility];  Service: Gynecology;  Laterality: Bilateral;  laparoscopic   CERVICAL CERCLAGE  09/01/2011   Procedure: CERCLAGE CERVICAL;  Surgeon: Donnamae Jude, MD;  Location: [facility];  Service: Gynecology;  Laterality: N/A;   CERVICAL CERCLAGE  06/28/2012   Procedure: CERCLAGE CERVICAL;  Surgeon: Donnamae Jude, MD;  Location: [facility];  Service: Gynecology;  Laterality: N/A;   DILATION AND CURETTAGE OF UTERUS     SAB   FINGER SURGERY  2003   reattachment of right forefinger   THERAPEUTIC ABORTION     THERAPEUTIC ABORTION     TONSILLECTOMY     TUBAL LIGATION  11/23/2012    OB History     Gravida  7   Para  4   Term  2   Preterm  2   AB  3   Living  2      SAB  1   IAB  2   Ectopic  0   Multiple  0   Live Births  3            Home Medications  Prior to Admission medications   Medication Sig Start Date End Date Taking? Authorizing Provider  amitriptyline (ELAVIL) 50 MG tablet Take 1/2 tablet by mouth for 4 days. Then take 1 tablet by mouth every night at bedtime 10/23/21  Yes   amLODipine (NORVASC) 10 MG tablet Take 1 tablet (10 mg total) by mouth daily. 07/30/22  Yes Ladell Pier, MD  ARIPiprazole (ABILIFY) 30 MG tablet Take 1 tablet (30 mg total) by mouth daily. 04/14/22  Yes   clindamycin (CLEOCIN) 300 MG capsule Take 1 capsule (300 mg total) by mouth 3 (three) times daily. 08/08/22  Yes Nyoka Lint, PA-C  hydrochlorothiazide (HYDRODIURIL) 25 MG tablet Take 1 tablet (25 mg total) by mouth daily. 07/30/22  Yes Ladell Pier, MD  ibuprofen (ADVIL) 600 MG tablet Take 1 tablet (600 mg total) by mouth every 6 (six) hours as needed for headache. 04/14/22  Yes Talbot Grumbling, FNP  lisinopril (ZESTRIL) 20 MG  tablet Take 1 tablet (20 mg total) by mouth daily. 07/30/22  Yes Ladell Pier, MD  omeprazole (PRILOSEC) 40 MG capsule TAKE 1 CAPSULE (40 MG TOTAL) BY MOUTH DAILY. 07/30/22  Yes Ladell Pier, MD  atomoxetine (STRATTERA) 60 MG capsule TAKE 1 CAPSULE BY ORAL ROUTE EVERY DAY IN THE MORNING 12/25/20 12/25/21    lamoTRIgine (LAMICTAL) 25 MG tablet TAKE 1 TABLET BY MOUTH ONCE DAILY FOR 2 WEEKS. THEN TAKE 2 TABLETS DAILY. 08/04/22     ondansetron (ZOFRAN ODT) 4 MG disintegrating tablet Take 1 tablet (4 mg total) by mouth every 8 (eight) hours as needed for nausea or vomiting. 09/10/21   Pearson Forster, NP  SUMAtriptan (IMITREX) 50 MG tablet take 1 tablet by mouth at the start of headache as needed. (May repeat in 2 hrs if no relief.  Max 2 tabs/24 hr) 02/20/22   Ladell Pier, MD  buPROPion ER [facility] SR) 100 MG 12 hr tablet Take 1 tablet by mouth every morning for 1 week. Then take 1 tablet twice each day. 07/21/22 08/04/22    losartan-hydrochlorothiazide (HYZAAR) 100-25 MG tablet Take 1 tablet by mouth daily. 06/10/19 01/02/20  Vanessa Kick, MD  pantoprazole (PROTONIX) 40 MG tablet Take 1 tablet (40 mg total) by mouth daily. 08/15/20 09/05/20  Charlott Rakes, MD  prazosin (MINIPRESS) 1 MG capsule Take 1 capsule by mouth every night at bedtime. 07/01/21 08/06/21    venlafaxine XR (EFFEXOR-XR) 37.5 MG 24 hr capsule Take 1 capsule by mouth every day with food 07/01/21 08/06/21      Family History Family History  Problem Relation Age of Onset   Hypertension Mother    Mental illness Mother        depression; alot of mental illness.   Cancer Father 60       Colon cancer age 59   Thyroid disease Maternal Grandmother    Diabetes Paternal Grandfather    Anesthesia problems Neg Hx    Other Neg Hx     Social History Social History   Tobacco Use   Smoking status: Every Day    Packs/day: 0.50    Years: 20.00    Total pack years: 10.00    Types: Cigarettes    Start date: 05/04/2014   Smokeless  tobacco: Never   Tobacco comments:    using the vapor only  Vaping Use   Vaping Use: Some days   Substances: Nicotine  Substance Use Topics   Alcohol use: Yes    Comment: at least 6 shots  per day most days; Last drink last  Tuesday 07/09/20   Drug use: Not Currently    Types: Cocaine, IV, Marijuana, Heroin, Methamphetamines, Other-see comments    Comment: prescription pain meds     Allergies   Keflex [cephalexin] and Hydrocodone-acetaminophen   Review of Systems Review of Systems  HENT:  Positive for dental problem (right lower tooth).      Physical Exam Triage Vital Signs ED Triage Vitals  Enc Vitals Group     BP 08/08/22 1041 (!) 144/114     Pulse Rate 08/08/22 1041 87     Resp 08/08/22 1041 16     Temp 08/08/22 1041 (!) 97.5 F (36.4 C)     Temp Source 08/08/22 1041 Oral     SpO2 08/08/22 1041 99 %     Weight --      Height --      Head Circumference --      Peak Flow --      Pain Score 08/08/22 1044 6     Pain Loc --      Pain Edu? --      Excl. in [place]? --    No data found.  Updated Vital Signs BP (!) 144/114 (BP Location: Left Arm)   Pulse 87   Temp (!) 97.5 F (36.4 C) (Oral)   Resp 16   LMP 07/30/2022   SpO2 99%   Visual Acuity Right Eye Distance:   Left Eye Distance:   Bilateral Distance:    Right Eye Near:   Left Eye Near:    Bilateral Near:     Physical Exam Constitutional:      Appearance: Normal appearance.  HENT:     Right Ear: Tympanic membrane and ear canal normal.     Left Ear: Tympanic membrane and ear canal normal.     Mouth/Throat:     Mouth: Mucous membranes are moist.     Pharynx: Oropharynx is clear.     Comments: Mouth: There is a broken tooth that is in front of the wisdom tooth area, there is associated redness and swelling of the gumline and tenderness on palpation.  There is no active drainage of the abscess. Lymphadenopathy:     Cervical: No cervical adenopathy.  Neurological:     Mental Status: She is alert.       UC Treatments / Results  Labs (all labs ordered are listed, but only abnormal results are displayed) Labs Reviewed - No data to display  EKG   Radiology No results found.  Procedures Procedures (including critical care time)  Medications Ordered in UC Medications - No data to display  Initial Impression / Assessment and Plan / UC Course  I have reviewed the triage vital signs and the nursing notes.  Pertinent labs & imaging results that were available during my care of the patient were reviewed by me and considered in my medical decision making (see chart for details).       Plan: 1.  Advised to take clindamycin 300 mg every 8 hours to treat the dental abscess. 2.  Patient is giving information for [facility] dental clinic and is advised to call to see if she can be worked in. 3.  Advised take ibuprofen for pain relief. 4.  Advised follow-up PCP or return to urgent care if symptoms fail to improve. Final Clinical Impressions(s) / UC Diagnoses   Final diagnoses:  Dental abscess  Pain, dental     Discharge Instructions  Advised to give [facility] dental clinic a call to see if they may be able to work him as a patient.  The number is [phone].  The address is [place]. Another alternative may be the dentist Dr. Donn Pierini at [place]., [place].  The number is [phone].  Advised to use ibuprofen or Tylenol for pain relief. Advised to take Pen-Vee K 500 mg as directed until completed to treat the abscess. Advised to use the Diflucan tablets as needed. Advised to follow-up with PCP or return to urgent care as needed.     ED Prescriptions     Medication Sig Dispense Auth. Provider   clindamycin (CLEOCIN) 300 MG capsule Take 1 capsule (300 mg total) by mouth 3 (three) times daily. 30 capsule Nyoka Lint, PA-C      PDMP not reviewed this encounter.   Nyoka Lint, PA-C 08/08/22 1113

## 2022-08-08 NOTE — Discharge Instructions (Signed)
Advised to give [facility] dental clinic a call to see if they may be able to work him as a patient.  The number is [phone].  The address is [place]. Another alternative may be the dentist Dr. Donn Pierini at [place] [place].  The number is [phone].  Advised to use ibuprofen or Tylenol for pain relief. Advised to take Pen-Vee K 500 mg as directed until completed to treat the abscess. Advised to use the Diflucan tablets as needed. Advised to follow-up with PCP or return to urgent care as needed.

## 2022-08-08 NOTE — ED Triage Notes (Signed)
Patient having pain in the bottom right tooth. Thinks there is an abscess as it has broken off into the gum. Onset last night.  Patient noticed some yellow pus and has been nauseous.

## 2022-08-10 ENCOUNTER — Other Ambulatory Visit: Payer: Self-pay

## 2022-08-17 ENCOUNTER — Other Ambulatory Visit: Payer: Self-pay

## 2022-09-22 ENCOUNTER — Other Ambulatory Visit: Payer: Self-pay

## 2022-09-22 MED ORDER — ARIPIPRAZOLE 5 MG PO TABS
ORAL_TABLET | ORAL | 1 refills | Status: DC
  Filled 2022-09-22 – 2022-10-19 (×2): qty 30, 32d supply, fill #0

## 2022-09-29 ENCOUNTER — Other Ambulatory Visit: Payer: Self-pay

## 2022-10-12 ENCOUNTER — Other Ambulatory Visit: Payer: Self-pay | Admitting: Internal Medicine

## 2022-10-12 ENCOUNTER — Other Ambulatory Visit: Payer: Self-pay

## 2022-10-12 MED ORDER — OMEPRAZOLE 40 MG PO CPDR
40.0000 mg | DELAYED_RELEASE_CAPSULE | Freq: Every day | ORAL | 2 refills | Status: DC
  Filled 2022-10-12: qty 90, 90d supply, fill #0
  Filled 2023-01-04 – 2023-01-20 (×2): qty 30, 30d supply, fill #1
  Filled 2023-03-10: qty 30, 30d supply, fill #2

## 2022-10-12 NOTE — Telephone Encounter (Signed)
Requested Prescriptions  Pending Prescriptions Disp Refills   omeprazole (PRILOSEC) 40 MG capsule 90 capsule 2    Sig: TAKE 1 CAPSULE (40 MG TOTAL) BY MOUTH DAILY.     Gastroenterology: Proton Pump Inhibitors Passed - 10/12/2022 10:44 AM      Passed - Valid encounter within last 12 months    Recent Outpatient Visits           2 months ago Essential hypertension   [place], MD   7 months ago Acute flank pain   [place], MD   9 months ago Migraine with aura and without status migrainosus, not intractable   [place], Vernia Buff, NP   1 year ago Hyperglycemia   [place], Dionne Bucy, [place]   2 years ago [place], [place], [place]

## 2022-10-13 ENCOUNTER — Other Ambulatory Visit: Payer: Self-pay

## 2022-10-13 ENCOUNTER — Ambulatory Visit (HOSPITAL_COMMUNITY)
Admission: EM | Admit: 2022-10-13 | Discharge: 2022-10-13 | Disposition: A | Discharge status: Home / Self Care | Payer: Self-pay | Attending: Nurse Practitioner | Admitting: Nurse Practitioner

## 2022-10-13 ENCOUNTER — Encounter (HOSPITAL_COMMUNITY): Payer: Self-pay | Admitting: Emergency Medicine

## 2022-10-13 DIAGNOSIS — J111 Influenza due to unidentified influenza virus with other respiratory manifestations: Secondary | ICD-10-CM

## 2022-10-13 DIAGNOSIS — Z1152 Encounter for screening for COVID-19: Secondary | ICD-10-CM

## 2022-10-13 DIAGNOSIS — J069 Acute upper respiratory infection, unspecified: Secondary | ICD-10-CM

## 2022-10-13 DIAGNOSIS — H6122 Impacted cerumen, left ear: Secondary | ICD-10-CM

## 2022-10-13 DIAGNOSIS — R110 Nausea: Secondary | ICD-10-CM

## 2022-10-13 LAB — RESP PANEL BY RT-PCR (FLU A&B, COVID) ARPGX2
Influenza A by PCR: NEGATIVE
Influenza B by PCR: NEGATIVE
SARS Coronavirus 2 by RT PCR: POSITIVE — AB

## 2022-10-13 MED ORDER — BENZONATATE 100 MG PO CAPS
100.0000 mg | ORAL_CAPSULE | Freq: Three times a day (TID) | ORAL | 0 refills | Status: DC | PRN
  Filled 2022-10-13: qty 21, 7d supply, fill #0

## 2022-10-13 NOTE — ED Triage Notes (Signed)
This morning started having body aches, chills, sweats, cough. Took tylenol and ibuprofen earlier today.

## 2022-10-13 NOTE — ED Provider Notes (Signed)
[facility]    CSN: [id number] Arrival date & time: 10/13/22  1535      History   Chief Complaint Chief Complaint  Patient presents with   Generalized Body Aches    HPI [name] is a 46 y.o. female.   Patient presents for 1 day of bodyaches, chills, low-grade fevers at home.  Also endorses slight nasal congestion, postnasal drainage, headache, nausea without vomiting, decreased appetite, and fatigue.  She denies cough, shortness of breath or chest pain, chest congestion, runny nose, sneezing or sore throat, abdominal pain, vomiting, diarrhea, loss of taste or smell, any rash.  Reports her left ear has been bothering her for the past couple of weeks.  Has taken Tylenol and ibuprofen for symptoms that started today which has helped a little bit.  Reports that she was recently taking care of her daughter who was sick with similar symptoms, is now feeling better.  Daughter was never tested for COVID-19 or influenza.    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   Substance induced mood disorder ([place]) 07/15/2020   Pica 05/21/2020   Prediabetes 05/21/2020   Alcohol use disorder, severe, dependence ([place]) 05/21/2020   Diarrhea 03/24/2020   Hypokalemia 03/24/2020   Iron deficiency anemia 03/24/2020   MDD (major depressive disorder), recurrent, severe, 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/23/2020   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[place])    Severe recurrent  major depression without psychotic features ([place]) 03/22/2020   Adjustment disorder with mixed anxiety and depressed mood 01/10/2020   Polysubstance abuse ([place]) 01/10/2020   Gastroesophageal reflux disease 03/08/2017   Anxiety 03/08/2017   Anxiety and depression 10/10/2015   History of intravenous drug use in remission 09/07/2011   HCV infection    Essential hypertension    Bipolar disorder ([place])    Bicornuate uterus     Past Surgical History:  Procedure Laterality Date   BILATERAL SALPINGECTOMY  12/06/2012   Procedure: BILATERAL SALPINGECTOMY;  Surgeon: Mora Bellman, MD;  Location: [facility];  Service: Gynecology;  Laterality: Bilateral;  laparoscopic   CERVICAL CERCLAGE  09/01/2011   Procedure: CERCLAGE CERVICAL;  Surgeon: Donnamae Jude, MD;  Location: [facility];  Service: Gynecology;  Laterality: N/A;   CERVICAL CERCLAGE  06/28/2012   Procedure: CERCLAGE CERVICAL;  Surgeon: Donnamae Jude, MD;  Location: [facility];  Service: Gynecology;  Laterality: N/A;   DILATION AND CURETTAGE OF UTERUS     SAB   FINGER SURGERY  2003   reattachment of right forefinger   THERAPEUTIC ABORTION     THERAPEUTIC ABORTION     TONSILLECTOMY     TUBAL LIGATION  11/23/2012    OB History     Gravida  7   Para  4   Term  2   Preterm  2   AB  3   Living  2      SAB  1   IAB  2   Ectopic  0   Multiple  0   Live Births  3            Home Medications    Prior to Admission medications   Medication Sig Start Date End Date Taking? Authorizing Provider  benzonatate (TESSALON) 100 MG capsule Take 1 capsule (100 mg total) by mouth 3 (three) times daily as needed for cough. Do not take with alcohol or while driving or operating heavy machinery.  May cause drowsiness. 10/13/22  Yes Eulogio Bear, NP  amitriptyline (ELAVIL) 50 MG tablet Take 1/2 tablet by mouth for 4 days. Then take 1 tablet by mouth every night at bedtime 10/23/21     amLODipine (NORVASC) 10 MG tablet Take 1 tablet (10 mg  total) by mouth daily. 07/30/22   Ladell Pier, MD  ARIPiprazole (ABILIFY) 30 MG tablet Take 1 tablet (30 mg total) by mouth daily. 04/14/22     ARIPiprazole (ABILIFY) 5 MG tablet Take 0.5 tablets (2.5 mg total) by mouth daily for 4 days, THEN 1 tablet (5 mg total) daily. 09/22/22 10/26/22    atomoxetine (STRATTERA) 60 MG capsule TAKE 1 CAPSULE BY ORAL ROUTE EVERY DAY IN THE MORNING 12/25/20 12/25/21    clindamycin (CLEOCIN) 300 MG capsule Take 1 capsule (300 mg total) by mouth 3 (three) times daily. 08/08/22   Nyoka Lint, PA-C  hydrochlorothiazide (HYDRODIURIL) 25 MG tablet Take 1 tablet (25 mg total) by mouth daily. 07/30/22   Ladell Pier, MD  ibuprofen (ADVIL) 600 MG tablet Take 1 tablet (600 mg total) by mouth every 6 (six) hours as needed for headache. 04/14/22   Talbot Grumbling, FNP  lisinopril (ZESTRIL) 20 MG tablet Take 1 tablet (20 mg total) by mouth daily. 07/30/22   Ladell Pier, MD  omeprazole (PRILOSEC) 40 MG capsule Take 1 capsule (40 mg total) by mouth daily. 10/12/22   Ladell Pier, MD  ondansetron (ZOFRAN ODT) 4 MG disintegrating tablet Take 1 tablet (4 mg total) by mouth every 8 (eight) hours as needed for nausea or vomiting. 09/10/21   Pearson Forster, NP  SUMAtriptan (IMITREX) 50 MG tablet take 1 tablet by mouth at the start of headache as needed. (May repeat in 2 hrs if no relief.  Max 2 tabs/24 hr) 02/20/22   Ladell Pier, MD  buPROPion ER [facility] SR) 100 MG 12 hr tablet Take 1 tablet by mouth every morning for 1 week. Then take 1 tablet twice each day. 07/21/22 08/04/22    lamoTRIgine (LAMICTAL) 25 MG tablet TAKE 1 TABLET BY MOUTH ONCE DAILY FOR 2 WEEKS. THEN TAKE 2 TABLETS DAILY. 08/04/22 09/22/22    losartan-hydrochlorothiazide (HYZAAR) 100-25 MG tablet Take 1 tablet by mouth daily. 06/10/19 01/02/20  Vanessa Kick, MD  pantoprazole (PROTONIX) 40 MG tablet Take 1 tablet (40 mg total) by mouth daily. 08/15/20 09/05/20  Charlott Rakes, MD  prazosin  (MINIPRESS) 1 MG capsule Take 1 capsule by mouth every night at bedtime. 07/01/21 08/06/21    venlafaxine XR (EFFEXOR-XR) 37.5 MG 24 hr capsule Take 1 capsule by mouth every day with food 07/01/21 08/06/21      Family History Family History  Problem Relation Age of Onset   Hypertension Mother    Mental illness Mother        depression; alot of mental illness.   Cancer Father 45  Colon cancer age 68   Thyroid disease Maternal Grandmother    Diabetes Paternal Grandfather    Anesthesia problems Neg Hx    Other Neg Hx     Social History Social History   Tobacco Use   Smoking status: Every Day    Packs/day: 0.50    Years: 20.00    Total pack years: 10.00    Types: Cigarettes    Start date: 05/04/2014   Smokeless tobacco: Never   Tobacco comments:    using the vapor only  Vaping Use   Vaping Use: Some days   Substances: Nicotine  Substance Use Topics   Alcohol use: Yes    Comment: at least 6 shots per day most days; Last drink last  Tuesday 07/09/20   Drug use: Not Currently    Types: Cocaine, IV, Marijuana, Heroin, Methamphetamines, Other-see comments    Comment: prescription pain meds     Allergies   Keflex [cephalexin] and Hydrocodone-acetaminophen   Review of Systems Review of Systems Per HPI  Physical Exam Triage Vital Signs ED Triage Vitals  Enc Vitals Group     BP 10/13/22 1550 (!) 143/113     Pulse Rate 10/13/22 1550 (!) 103     Resp 10/13/22 1550 16     Temp 10/13/22 1550 98.5 F (36.9 C)     Temp Source 10/13/22 1550 Oral     SpO2 10/13/22 1550 100 %     Weight --      Height --      Head Circumference --      Peak Flow --      Pain Score 10/13/22 1549 6     Pain Loc --      Pain Edu? --      Excl. in [place]? --    No data found.  Updated Vital Signs BP (!) 143/113 (BP Location: Left Arm)   Pulse (!) 103   Temp 98.5 F (36.9 C) (Oral)   Resp 16   LMP 09/24/2022   SpO2 100%   Visual Acuity Right Eye Distance:   Left Eye Distance:    Bilateral Distance:    Right Eye Near:   Left Eye Near:    Bilateral Near:     Physical Exam Vitals and nursing note reviewed.  Constitutional:      General: She is not in acute distress.    Appearance: Normal appearance. She is not ill-appearing or toxic-appearing.  HENT:     Head: Normocephalic and atraumatic.     Right Ear: Tympanic membrane, ear canal and external ear normal.     Left Ear: Tympanic membrane, ear canal and external ear normal. There is impacted cerumen.     Nose: No congestion or rhinorrhea.     Mouth/Throat:     Mouth: Mucous membranes are moist.     Pharynx: Oropharynx is clear. Posterior oropharyngeal erythema present. No oropharyngeal exudate.  Eyes:     General: No scleral icterus.    Extraocular Movements: Extraocular movements intact.  Cardiovascular:     Rate and Rhythm: Normal rate and regular rhythm.  Pulmonary:     Effort: Pulmonary effort is normal. No respiratory distress.     Breath sounds: Normal breath sounds. No wheezing, rhonchi or rales.  Abdominal:     General: Abdomen is flat. Bowel sounds are normal. There is no distension.     Palpations: Abdomen is soft.     Tenderness: There is no abdominal tenderness. There is no guarding.  Musculoskeletal:     Cervical back: Normal range of motion and neck supple.  Lymphadenopathy:     Cervical: No cervical adenopathy.  Skin:    General: Skin is warm and dry.     Coloration: Skin is not jaundiced or pale.     Findings: No erythema or rash.  Neurological:     Mental Status: She is alert and oriented to person, place, and time.  Psychiatric:        Behavior: Behavior is cooperative.      UC Treatments / Results  Labs (all labs ordered are listed, but only abnormal results are displayed) Labs Reviewed  RESP PANEL BY RT-PCR (FLU A&B, COVID) [id number]    EKG   Radiology No results found.  Procedures Procedures (including critical care time)  Medications Ordered in UC Medications  - No data to display  Initial Impression / Assessment and Plan / UC Course  I have reviewed the triage vital signs and the nursing notes.  Pertinent labs & imaging results that were available during my care of the patient were reviewed by me and considered in my medical decision making (see chart for details).   Patient is well-appearing, normotensive, afebrile, not tachycardic, not tachypneic, oxygenating well on room air.    Viral URI Influenza-like illness Encounter for screening for COVID-19 Nausea without vomiting Suspect secondary to viral illness COVID-19, influenza testing pending Supportive care discussed Patient is a good candidate for Tamiflu or molnupiravir if she tests positive Unable to prescribe antiemetic as patient has history of prolonged QTc, supportive care discussed for nausea Tessalon Perles sent to pharmacy Note given for work ER return precautions discussed  Impacted cerumen, left ear Attempted removal with curette, however this caused pain Recommended use of Debrox for couple days, then without movement, can return to urgent care for ear irrigation Is able to visualize approximately 10% of the tympanic membrane behind the impacted cerumen which was pearly gray  The patient was given the opportunity to ask questions.  All questions answered to their satisfaction.  The patient is in agreement to this plan.    Final Clinical Impressions(s) / UC Diagnoses   Final diagnoses:  Viral URI  Influenza-like illness  Encounter for screening for COVID-19  Nausea without vomiting  Impacted cerumen, left ear     Discharge Instructions      You have a viral upper respiratory infection.  Symptoms should improve over the next week to 10 days.  If you develop chest pain or shortness of breath, go to the emergency room.  We have tested you today for COVID-19 and influenza.  You will see the results in Mychart and we will call you with positive results.    Please stay  home and isolate until you are aware of the results.    Some things that can make you feel better are: - Increased rest - Increasing fluid with water/sugar free electrolytes - Acetaminophen and ibuprofen as needed for fever/pain - Salt water gargling, chloraseptic spray and throat lozenges for sore throat - OTC guaifenesin (Mucinex) 600 mg twice daily for congestion - Saline sinus flushes or a neti pot - Humidifying the air -Tessalon Perles during the day as needed for dry cough  Use the OTC Debrox drops to help loosen the ear wax and help it to come out easier.  You can come back to see [place] if the ear pain persists so we can irrigate the ear wax out.     ED Prescriptions  Medication Sig Dispense Auth. Provider   benzonatate (TESSALON) 100 MG capsule Take 1 capsule (100 mg total) by mouth 3 (three) times daily as needed for cough. Do not take with alcohol or while driving or operating heavy machinery.  May cause drowsiness. 21 capsule Eulogio Bear, NP      PDMP not reviewed this encounter.   Eulogio Bear, NP 10/13/22 [phone]

## 2022-10-13 NOTE — Discharge Instructions (Addendum)
You have a viral upper respiratory infection.  Symptoms should improve over the next week to 10 days.  If you develop chest pain or shortness of breath, go to the emergency room.  We have tested you today for COVID-19 and influenza.  You will see the results in Mychart and we will call you with positive results.    Please stay home and isolate until you are aware of the results.    Some things that can make you feel better are: - Increased rest - Increasing fluid with water/sugar free electrolytes - Acetaminophen and ibuprofen as needed for fever/pain - Salt water gargling, chloraseptic spray and throat lozenges for sore throat - OTC guaifenesin (Mucinex) 600 mg twice daily for congestion - Saline sinus flushes or a neti pot - Humidifying the air -Tessalon Perles during the day as needed for dry cough  Use the OTC Debrox drops to help loosen the ear wax and help it to come out easier.  You can come back to see [place] if the ear pain persists so we can irrigate the ear wax out.

## 2022-10-14 ENCOUNTER — Telehealth (HOSPITAL_COMMUNITY): Payer: Self-pay | Admitting: Emergency Medicine

## 2022-10-19 ENCOUNTER — Other Ambulatory Visit: Payer: Self-pay

## 2022-10-28 ENCOUNTER — Other Ambulatory Visit: Payer: Self-pay

## 2022-11-03 ENCOUNTER — Ambulatory Visit: Payer: Self-pay

## 2022-11-03 NOTE — Telephone Encounter (Signed)
Triage Nurse called patient at 2:05 pm to offered open appointment for 2:50 pm on today. Patient voiced that she has in appointment with walk in clinic at 10:00 am tomorrow.

## 2022-11-03 NOTE — Telephone Encounter (Signed)
  Chief Complaint: STD testing Symptoms: mouth ulcers all over mouth and tongue Frequency: 2 months  Pertinent Negatives: NA Disposition: '[]'$ ED /'[x]'$ Urgent Care (no appt availability in office) / '[]'$ Appointment(In office/virtual)/ '[]'$  [facility]/ '[]'$ Home Care/ '[]'$ Refused Recommended Disposition /'[]'$ [facility]/ '[]'$  Follow-up with PCP Additional Notes: pt states she has taken valtrex and now only has few spots on tip of tongue but pt concerned about STD and wanting testing to be sure. No appts with practice so pt states she would go to UC. Scheduled appt for tomorrow at 1000.   Summary: outbreak inside and outside of mouth   Pt states she had an outbreak inside and outside of her mouth  Pt states she took valtrex and it cleared up in a week  Pt requesting an appt for std screening, there are no appts available  Please assist further     Reason for Disposition  Patient is worried they have a sexually transmitted infection (STI)  Answer Assessment - Initial Assessment Questions 1. MAIN CONCERN: "What were you exposed to?"  "What sexually transmitted infection (STI) does your sex partner have?" (e.g., gonorrhea, herpes, HIV, pubic lice)     Had sex 2 months ago, unsure if STD but wanting testing 3. DATE of EXPOSURE: "When did the exposure occur?" (e.g., days)     2 months ago 4. SYMPTOMS: "Do you have any symptoms?" (e.g., pain with urination, rash, sores)     mouth ulcers all over mouth and tongue  Protocols used: STI Exposure-A-AH

## 2022-11-03 NOTE — Telephone Encounter (Signed)
Noted  

## 2022-11-04 ENCOUNTER — Encounter (HOSPITAL_COMMUNITY): Payer: Self-pay

## 2022-11-04 ENCOUNTER — Ambulatory Visit (HOSPITAL_COMMUNITY)
Admission: RE | Admit: 2022-11-04 | Discharge: 2022-11-04 | Disposition: A | Discharge status: Home / Self Care | Payer: Medicaid Other | Source: Ambulatory Visit | Attending: Emergency Medicine | Admitting: Emergency Medicine

## 2022-11-04 VITALS — BP 125/83 | HR 89 | Temp 98.3°F | Resp 12

## 2022-11-04 DIAGNOSIS — K1370 Unspecified lesions of oral mucosa: Secondary | ICD-10-CM

## 2022-11-04 NOTE — Discharge Instructions (Signed)
[place]ion in the inside of your mouth has been swabbed for herpes, you will be notified of positive test results only permission is inside your packet regarding herpes virus  You may continue use of Valtrex, may increase the dosage to 3 times daily for total of 7 days  Avoid acidic and spicy foods as this may cause further irritation to your mouth  May complete salt water rinses for comfort as well as use oral gel over the blistering  May follow-up with this urgent care as needed if symptoms persist or worsen

## 2022-11-04 NOTE — ED Triage Notes (Signed)
Pt is here for blisters in the mouth. Prt stated she had a out break 1 month ago and it has flatred back up within 1wk ago

## 2022-11-04 NOTE — ED Provider Notes (Signed)
[place]    CSN: [id number] Arrival date & time: 11/04/22  1006      History   Chief Complaint Chief Complaint  Patient presents with   Mouth Lesions    HPI [name] is a 46 y.o. female.   Patient presents for evaluation of blisters present within the mouth for 7 days.  Symptoms began after a unprotected oral sexual encounter, started next day.  Partner denies symptoms or known history of herpes.  Endorses that blistering has improved with use of Valtrex that she received from a family member.  Endorses that blistering was initially the size of a #2 pencil eraser and very painful.  History of HPV.  Denies genital lesions.   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   Substance induced mood disorder ([place]) 07/15/2020   Pica 05/21/2020   Prediabetes 05/21/2020   Alcohol use disorder, severe, dependence ([place]) 05/21/2020   Diarrhea 03/24/2020   Hypokalemia 03/24/2020   Iron deficiency anemia 03/24/2020   MDD (major depressive disorder), recurrent, severe, with psychosis ([place]) 03/23/2020   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[place])    Severe recurrent major depression without psychotic features ([place]) 03/22/2020   Adjustment disorder with mixed anxiety and depressed mood 01/10/2020   Polysubstance abuse ([place]) 01/10/2020   Gastroesophageal reflux disease 03/08/2017   Anxiety 03/08/2017   Anxiety and depression 10/10/2015   History of  intravenous drug use in remission 09/07/2011   HCV infection    Essential hypertension    Bipolar disorder ([place])    Bicornuate uterus     Past Surgical History:  Procedure Laterality Date   BILATERAL SALPINGECTOMY  12/06/2012   Procedure: BILATERAL SALPINGECTOMY;  Surgeon: Mora Bellman, MD;  Location: [facility];  Service: Gynecology;  Laterality: Bilateral;  laparoscopic   CERVICAL CERCLAGE  09/01/2011   Procedure: CERCLAGE CERVICAL;  Surgeon: Donnamae Jude, MD;  Location: [facility];  Service: Gynecology;  Laterality: N/A;   CERVICAL CERCLAGE  06/28/2012   Procedure: CERCLAGE CERVICAL;  Surgeon: Donnamae Jude, MD;  Location: [facility];  Service: Gynecology;  Laterality: N/A;   DILATION AND CURETTAGE OF UTERUS     SAB   FINGER SURGERY  2003   reattachment of right forefinger   THERAPEUTIC ABORTION     THERAPEUTIC ABORTION     TONSILLECTOMY     TUBAL LIGATION  11/23/2012    OB History     Gravida  7   Para  4   Term  2   Preterm  2   AB  3   Living  2      SAB  1   IAB  2   Ectopic  0   Multiple  0   Live Births  3  Home Medications    Prior to Admission medications   Medication Sig Start Date End Date Taking? Authorizing Provider  amitriptyline (ELAVIL) 50 MG tablet Take 1/2 tablet by mouth for 4 days. Then take 1 tablet by mouth every night at bedtime 10/23/21     amLODipine (NORVASC) 10 MG tablet Take 1 tablet (10 mg total) by mouth daily. 07/30/22   Ladell Pier, MD  ARIPiprazole (ABILIFY) 30 MG tablet Take 1 tablet (30 mg total) by mouth daily. 04/14/22     ARIPiprazole (ABILIFY) 5 MG tablet Take 0.5 tablets (2.5 mg total) by mouth daily for 4 days, THEN 1 tablet (5 mg total) daily. 09/22/22 10/26/22    atomoxetine (STRATTERA) 60 MG capsule TAKE 1 CAPSULE BY ORAL ROUTE EVERY DAY IN THE MORNING 12/25/20 12/25/21    benzonatate (TESSALON) 100 MG capsule Take 1 capsule (100 mg total) by mouth 3 (three) times daily as needed for cough. Do not take with  alcohol or while driving or operating heavy machinery.  May cause drowsiness. 10/13/22   Eulogio Bear, NP  clindamycin (CLEOCIN) 300 MG capsule Take 1 capsule (300 mg total) by mouth 3 (three) times daily. 08/08/22   Nyoka Lint, PA-C  hydrochlorothiazide (HYDRODIURIL) 25 MG tablet Take 1 tablet (25 mg total) by mouth daily. 07/30/22   Ladell Pier, MD  ibuprofen (ADVIL) 600 MG tablet Take 1 tablet (600 mg total) by mouth every 6 (six) hours as needed for headache. 04/14/22   Talbot Grumbling, FNP  lisinopril (ZESTRIL) 20 MG tablet Take 1 tablet (20 mg total) by mouth daily. 07/30/22   Ladell Pier, MD  omeprazole (PRILOSEC) 40 MG capsule Take 1 capsule (40 mg total) by mouth daily. 10/12/22   Ladell Pier, MD  ondansetron (ZOFRAN ODT) 4 MG disintegrating tablet Take 1 tablet (4 mg total) by mouth every 8 (eight) hours as needed for nausea or vomiting. 09/10/21   Pearson Forster, NP  SUMAtriptan (IMITREX) 50 MG tablet take 1 tablet by mouth at the start of headache as needed. (May repeat in 2 hrs if no relief.  Max 2 tabs/24 hr) 02/20/22   Ladell Pier, MD  buPROPion ER [facility] SR) 100 MG 12 hr tablet Take 1 tablet by mouth every morning for 1 week. Then take 1 tablet twice each day. 07/21/22 08/04/22    lamoTRIgine (LAMICTAL) 25 MG tablet TAKE 1 TABLET BY MOUTH ONCE DAILY FOR 2 WEEKS. THEN TAKE 2 TABLETS DAILY. 08/04/22 09/22/22    losartan-hydrochlorothiazide (HYZAAR) 100-25 MG tablet Take 1 tablet by mouth daily. 06/10/19 01/02/20  Vanessa Kick, MD  pantoprazole (PROTONIX) 40 MG tablet Take 1 tablet (40 mg total) by mouth daily. 08/15/20 09/05/20  Charlott Rakes, MD  prazosin (MINIPRESS) 1 MG capsule Take 1 capsule by mouth every night at bedtime. 07/01/21 08/06/21    venlafaxine XR (EFFEXOR-XR) 37.5 MG 24 hr capsule Take 1 capsule by mouth every day with food 07/01/21 08/06/21      Family History Family History  Problem Relation Age of Onset   Hypertension Mother     Mental illness Mother        depression; alot of mental illness.   Cancer Father 33       Colon cancer age 22   Thyroid disease Maternal Grandmother    Diabetes Paternal Grandfather    Anesthesia problems Neg Hx    Other Neg Hx     Social History Social History   Tobacco Use  Smoking status: Every Day    Packs/day: 0.50    Years: 20.00    Total pack years: 10.00    Types: Cigarettes    Start date: 05/04/2014   Smokeless tobacco: Never   Tobacco comments:    using the vapor only  Vaping Use   Vaping Use: Some days   Substances: Nicotine  Substance Use Topics   Alcohol use: Yes    Comment: at least 6 shots per day most days; Last drink last  Tuesday 07/09/20   Drug use: Not Currently    Types: Cocaine, IV, Marijuana, Heroin, Methamphetamines, Other-see comments    Comment: prescription pain meds     Allergies   Keflex [cephalexin] and Hydrocodone-acetaminophen   Review of Systems Review of Systems Defer to HPI   Physical Exam Triage Vital Signs ED Triage Vitals  Enc Vitals Group     BP 11/04/22 1018 125/83     Pulse Rate 11/04/22 1018 89     Resp 11/04/22 1018 12     Temp 11/04/22 1018 98.3 F (36.8 C)     Temp Source 11/04/22 1018 Oral     SpO2 11/04/22 1018 98 %     Weight --      Height --      Head Circumference --      Peak Flow --      Pain Score 11/04/22 1017 2     Pain Loc --      Pain Edu? --      Excl. in [place]? --    No data found.  Updated Vital Signs BP 125/83 (BP Location: Left Arm)   Pulse 89   Temp 98.3 F (36.8 C) (Oral)   Resp 12   LMP 10/26/2022   SpO2 98%   Visual Acuity Right Eye Distance:   Left Eye Distance:   Bilateral Distance:    Right Eye Near:   Left Eye Near:    Bilateral Near:     Physical Exam Constitutional:      Appearance: Normal appearance.  HENT:     Head: Normocephalic.     Mouth/Throat:     Comments: 3 less than 0.5 cm macular lesions present to the tip of tongue, 1 less than 0.5 cm blisterlike  lesion present along the lower gumline, tender to palpation Eyes:     Extraocular Movements: Extraocular movements intact.  Pulmonary:     Effort: Pulmonary effort is normal.  Neurological:     Mental Status: She is alert and oriented to person, place, and time.      UC Treatments / Results  Labs (all labs ordered are listed, but only abnormal results are displayed) Labs Reviewed - No data to display  EKG   Radiology No results found.  Procedures Procedures (including critical care time)  Medications Ordered in UC Medications - No data to display  Initial Impression / Assessment and Plan / UC Course  I have reviewed the triage vital signs and the nursing notes.  Pertinent labs & imaging results that were available during my care of the patient were reviewed by me and considered in my medical decision making (see chart for details).  Mouth lesion  Culture obtained, pending, may continue use of Valtrex at home as it has been deemed helpful, discussed administration, advised avoidance of acidic and spicy foods to prevent irritation and discussed blister care as well as given written handout, may follow-up with his urgent care as needed if symptoms persist or worsen  Final Clinical Impressions(s) / UC Diagnoses   Final diagnoses:  None   Discharge Instructions   None    ED Prescriptions   None    PDMP not reviewed this encounter.   Hans Eden, NP 11/04/22 1054

## 2022-11-06 ENCOUNTER — Ambulatory Visit: Payer: Self-pay | Admitting: *Deleted

## 2022-11-06 LAB — HSV CULTURE AND TYPING

## 2022-11-06 NOTE — Telephone Encounter (Signed)
Summary: std not going away   Pt called saying she was treated for STD and it is coming back and breaking out more.  CB#  [phone]     Reason for Disposition  Large blisters in mouth (i.e., fluid filled bubbles or sacs)  Answer Assessment - Initial Assessment Questions 1. LOCATION: "Where is the ulcer located?"      Mouth, tongue, nose 2. NUMBER: "How many ulcers are there?"      Multiple- 1 nose, 14 in mouth(tongue, gumline, inner lip 3. SIZE: "How large is the ulcer?"      Large to small 4. SEVERITY: "Are they painful?" If Yes, ask: "How bad is it?"  (Scale 1-10; or mild, moderate, severe)  - MILD - eating  and drinking normally   - MODERATE - decreased liquid intake   - SEVERE - drinking very little      moderate 5. ONSET: "When did you first notice the ulcer?"      Started 2 month ago-went away 1 week ago 6. RECURRENT SYMPTOM: "Have you had a mouth ulcer before?" If Yes, ask: "When was the last time?" and "What happened that time?"      Yes- never like this 7. CAUSE: "What do you think is causing the mouth ulcer?"     Possible herpes outbreak 8. OTHER SYMPTOMS: "Do you have any other symptoms?" (e.g., fever)     fatigue  Protocols used: Mouth Ulcers-A-AH

## 2022-11-06 NOTE — Telephone Encounter (Signed)
Patient has scheduled an apt with UC.

## 2022-11-06 NOTE — Telephone Encounter (Signed)
  Chief Complaint: possible herpes outbreak Symptoms: not getting better with medication, painful in mouth, fatigue Frequency: 1 week Pertinent Negatives: Patient denies fever Disposition: '[]'$ ED /'[x]'$ Urgent Care (no appt availability in office) / '[]'$ Appointment(In office/virtual)/ '[]'$  [facility]/ '[]'$ Home Care/ '[]'$ Refused Recommended Disposition /'[]'$ [facility]/ '[]'$  Follow-up with PCP Additional Notes: Patient advised UC for reevaluation- increased sore, labs have not returned

## 2022-11-07 ENCOUNTER — Ambulatory Visit (HOSPITAL_COMMUNITY): Payer: Medicaid Other

## 2022-11-13 ENCOUNTER — Ambulatory Visit: Payer: Self-pay | Admitting: *Deleted

## 2022-11-13 ENCOUNTER — Telehealth: Payer: Self-pay | Admitting: Physician Assistant

## 2022-11-13 ENCOUNTER — Telehealth: Payer: Self-pay | Admitting: Family Medicine

## 2022-11-13 DIAGNOSIS — T3695XA Adverse effect of unspecified systemic antibiotic, initial encounter: Secondary | ICD-10-CM

## 2022-11-13 DIAGNOSIS — R3989 Other symptoms and signs involving the genitourinary system: Secondary | ICD-10-CM

## 2022-11-13 DIAGNOSIS — B379 Candidiasis, unspecified: Secondary | ICD-10-CM

## 2022-11-13 MED ORDER — PHENAZOPYRIDINE HCL 100 MG PO TABS: 100.0000 mg | ORAL_TABLET | Freq: Three times a day (TID) | ORAL | 0 refills | Status: DC | PRN

## 2022-11-13 MED ORDER — FLUCONAZOLE 150 MG PO TABS: 150.0000 mg | ORAL_TABLET | ORAL | 0 refills | Status: DC | PRN

## 2022-11-13 MED ORDER — SULFAMETHOXAZOLE-TRIMETHOPRIM 800-160 MG PO TABS: 1.0000 | ORAL_TABLET | Freq: Two times a day (BID) | ORAL | 0 refills | Status: DC

## 2022-11-13 NOTE — Progress Notes (Signed)
Virtual Visit Consent   [name], you are scheduled for a virtual visit with a [facility] provider today. Just as with appointments in the office, your consent must be obtained to participate. Your consent will be active for this visit and any virtual visit you may have with one of our providers in the next 365 days. If you have a MyChart account, a copy of this consent can be sent to you electronically.  As this is a virtual visit, video technology does not allow for your provider to perform a traditional examination. This may limit your provider's ability to fully assess your condition. If your provider identifies any concerns that need to be evaluated in person or the need to arrange testing (such as labs, EKG, etc.), we will make arrangements to do so. Although advances in technology are sophisticated, we cannot ensure that it will always work on either your end or our end. If the connection with a video visit is poor, the visit may have to be switched to a telephone visit. With either a video or telephone visit, we are not always able to ensure that we have a secure connection.  By engaging in this virtual visit, you consent to the provision of healthcare and authorize for your insurance to be billed (if applicable) for the services provided during this visit. Depending on your insurance coverage, you may receive a charge related to this service.  I need to obtain your verbal consent now. Are you willing to proceed with your visit today? [name] has provided verbal consent on 11/13/2022 for a virtual visit (video or telephone). Mar Daring, PA-C  Date: 11/13/2022 6:48 PM  Virtual Visit via Video Note   I, Mar Daring, connected with  [name]  ([id number], 07/18/1976) on 11/13/22 at  6:30 PM EST by a video-enabled telemedicine application and verified that I am speaking with the correct person using two identifiers.  Location: Patient: Virtual Visit  Location Patient: Mobile Provider: Virtual Visit Location Provider: Home Office   I discussed the limitations of evaluation and management by telemedicine and the availability of in person appointments. The patient expressed understanding and agreed to proceed.    History of Present Illness: [name] is a 46 y.o. who identifies as a female who was assigned female at birth, and is being seen today for possible UTI.  HPI: Urinary Tract Infection  This is a new problem. The current episode started in the past 7 days (2 days ago). The problem occurs every urination. The problem has been gradually worsening. The quality of the pain is described as aching, burning and stabbing. The pain is moderate. There has been no fever (felt feverish but did not have documented fever). Associated symptoms include frequency, hesitancy, nausea (has been happening over the last week), sweats, urgency and vomiting (once at work). Pertinent negatives include no chills, flank pain or hematuria. She has tried increased fluids (AZO) for the symptoms. The treatment provided no relief. Her past medical history is significant for recurrent UTIs.     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[place])  Severe recurrent major depression without psychotic features ([place]) 03/22/2020   Adjustment disorder with mixed anxiety and depressed mood 01/10/2020   Polysubstance abuse ([place]) 01/10/2020   Gastroesophageal reflux disease 03/08/2017   Anxiety 03/08/2017   Anxiety and depression 10/10/2015   History of intravenous drug  use in remission 09/07/2011   HCV infection    Essential hypertension    Bipolar disorder ([place])    Bicornuate uterus     Allergies:  Allergies  Allergen Reactions   Keflex [Cephalexin] Shortness Of Breath    Dry mouth and nausea   Hydrocodone-Acetaminophen Nausea Only   Medications:  Current Outpatient Medications:    fluconazole (DIFLUCAN) 150 MG tablet, Take 1 tablet (150 mg total) by mouth every 3 (three) days as needed., Disp: 2 tablet, Rfl: 0   phenazopyridine (PYRIDIUM) 100 MG tablet, Take 1 tablet (100 mg total) by mouth 3 (three) times daily as needed for pain., Disp: 10 tablet, Rfl: 0   sulfamethoxazole-trimethoprim (BACTRIM DS) 800-160 MG tablet, Take 1 tablet by mouth 2 (two) times daily., Disp: 10 tablet, Rfl: 0   amitriptyline (ELAVIL) 50 MG tablet, Take 1/2 tablet by mouth for 4 days. Then take 1 tablet by mouth every night at bedtime, Disp: 30 tablet, Rfl: 0   amLODipine (NORVASC) 10 MG tablet, Take 1 tablet (10 mg total) by mouth daily., Disp: 90 tablet, Rfl: 1   ARIPiprazole (ABILIFY) 30 MG tablet, Take 1 tablet (30 mg total) by mouth daily., Disp: 30 tablet, Rfl: 0   ARIPiprazole (ABILIFY) 5 MG tablet, Take 0.5 tablets (2.5 mg total) by mouth daily for 4 days, THEN 1 tablet (5 mg total) daily., Disp: 30 tablet, Rfl: 1   atomoxetine (STRATTERA) 60 MG capsule, TAKE 1 CAPSULE BY ORAL ROUTE EVERY DAY IN THE MORNING, Disp: 30 capsule, Rfl: 0   benzonatate (TESSALON) 100 MG capsule, Take 1 capsule (100 mg total) by mouth 3 (three) times daily as needed for cough. Do not take with alcohol or while driving or operating heavy machinery.  May cause drowsiness., Disp: 21 capsule, Rfl: 0   clindamycin (CLEOCIN) 300 MG capsule, Take 1 capsule (300 mg total) by mouth 3 (three) times daily., Disp: 30 capsule, Rfl: 0   hydrochlorothiazide (HYDRODIURIL) 25 MG tablet, Take 1 tablet (25 mg total) by mouth daily., Disp: 90 tablet, Rfl: 1   ibuprofen (ADVIL) 600 MG tablet, Take 1 tablet (600  mg total) by mouth every 6 (six) hours as needed for headache., Disp: 30 tablet, Rfl: 0   lisinopril (ZESTRIL) 20 MG tablet, Take 1 tablet (20 mg total) by mouth daily., Disp: 90 tablet, Rfl: 1   omeprazole (PRILOSEC) 40 MG capsule, Take 1 capsule (40 mg total) by mouth daily., Disp: 90 capsule, Rfl: 2   ondansetron (ZOFRAN ODT) 4 MG disintegrating tablet, Take 1 tablet (4 mg total) by mouth every 8 (eight) hours as needed for nausea or vomiting., Disp: 20 tablet, Rfl: 0   SUMAtriptan (IMITREX) 50 MG tablet, take 1 tablet by mouth at the start of headache as needed. (May repeat in 2 hrs if no relief.  Max 2 tabs/24 hr), Disp: 20 tablet, Rfl: 0  Observations/Objective: Patient is well-developed, well-nourished in no acute distress.  Resting comfortably  Head is normocephalic, atraumatic.  No labored breathing.  Speech is clear and coherent with logical content.  Patient is alert and oriented at baseline.    Assessment and Plan: 1. Suspected UTI - sulfamethoxazole-trimethoprim (BACTRIM DS) 800-160 MG tablet; Take 1 tablet by  mouth 2 (two) times daily.  Dispense: 10 tablet; Refill: 0 - phenazopyridine (PYRIDIUM) 100 MG tablet; Take 1 tablet (100 mg total) by mouth 3 (three) times daily as needed for pain.  Dispense: 10 tablet; Refill: 0  2. Antibiotic-induced yeast infection - fluconazole (DIFLUCAN) 150 MG tablet; Take 1 tablet (150 mg total) by mouth every 3 (three) days as needed.  Dispense: 2 tablet; Refill: 0  - Worsening symptoms.  - Will treat empirically with Bactrim - Pyridium for bladder spasms - Continue to push fluids.  - Diflucan given as prophylaxis as patient tends to get vaginal yeast infections with antibiotic use - Seek in person evaluation for urine culture if symptoms do not improve or if they worsen.    Follow Up Instructions: I discussed the assessment and treatment plan with the patient. The patient was provided an opportunity to ask questions and all were answered.  The patient agreed with the plan and demonstrated an understanding of the instructions.  A copy of instructions were sent to the patient via MyChart unless otherwise noted below.    The patient was advised to call back or seek an in-person evaluation if the symptoms worsen or if the condition fails to improve as anticipated.  Time:  I spent 15 minutes with the patient via telehealth technology discussing the above problems/concerns.    Mar Daring, PA-C

## 2022-11-13 NOTE — Telephone Encounter (Signed)
Summary: pain and burning with urination   Patient states that she is having pain and burning with urination.      Called [phone] to review sx of burning with urination. No answer, LVMTCB [phone].

## 2022-11-13 NOTE — Progress Notes (Signed)
Pt contacted by phone and will reschedule due to a nail apptmt- [facility]

## 2022-11-13 NOTE — Telephone Encounter (Signed)
   Chief Complaint: urinary pain Symptoms: severe pain with urination, frequency, urgency Frequency: 2 days Pertinent Negatives: Patient denies fever Disposition: '[]'$ ED /'[]'$ Urgent Care (no appt availability in office) / '[]'$ Appointment(In office/virtual)/ '[]'$  [facility]/ '[]'$ Home Care/ '[]'$ Refused Recommended Disposition /'[]'$  Mobile Bus/ '[]'$  Follow-up with PCP Additional Notes: virtual UC appointment scheduled Reason for Disposition  [1] SEVERE pain with urination (e.g., excruciating) AND [2] not improved after 2 hours of pain medicine and Sitz bath  Answer Assessment - Initial Assessment Questions 1. SEVERITY: "How bad is the pain?"  (e.g., Scale 1-10; mild, moderate, or severe)   - MILD (1-3): complains slightly about urination hurting   - MODERATE (4-7): interferes with normal activities     - SEVERE (8-10): excruciating, unwilling or unable to urinate because of the pain      yes 2. FREQUENCY: "How many times have you had painful urination today?"      Every time 3. PATTERN: "Is pain present every time you urinate or just sometimes?"      Every time 4. ONSET: "When did the painful urination start?"      2 days ago 5. FEVER: "Do you have a fever?" If Yes, ask: "What is your temperature, how was it measured, and when did it start?"     No- possibly couple days ago 6. PAST UTI: "Have you had a urine infection before?" If Yes, ask: "When was the last time?" and "What happened that time?"      Yes- but this came on fast 7. CAUSE: "What do you think is causing the painful urination?"  (e.g., UTI, scratch, Herpes sore)     UTI 8. OTHER SYMPTOMS: "Do you have any other symptoms?" (e.g., blood in urine, flank pain, genital sores, urgency, vaginal discharge)     Urgency, back pain  Protocols used: Urination Pain - Female-A-AH

## 2022-11-13 NOTE — Patient Instructions (Signed)
[name], thank you for joining Mar Daring, PA-C for today's virtual visit.  While this provider is not your primary care provider (PCP), if your PCP is located in our provider database this encounter information will be shared with them immediately following your visit.   [facility] account gives you access to today's visit and all your visits, tests, and labs performed at [facility] " click here if you don't have a [facility] account or go to [email or web address]  Consent: (Patient) [name] provided verbal consent for this virtual visit at the beginning of the encounter.  Current Medications:  Current Outpatient Medications:    fluconazole (DIFLUCAN) 150 MG tablet, Take 1 tablet (150 mg total) by mouth every 3 (three) days as needed., Disp: 2 tablet, Rfl: 0   phenazopyridine (PYRIDIUM) 100 MG tablet, Take 1 tablet (100 mg total) by mouth 3 (three) times daily as needed for pain., Disp: 10 tablet, Rfl: 0   sulfamethoxazole-trimethoprim (BACTRIM DS) 800-160 MG tablet, Take 1 tablet by mouth 2 (two) times daily., Disp: 10 tablet, Rfl: 0   amitriptyline (ELAVIL) 50 MG tablet, Take 1/2 tablet by mouth for 4 days. Then take 1 tablet by mouth every night at bedtime, Disp: 30 tablet, Rfl: 0   amLODipine (NORVASC) 10 MG tablet, Take 1 tablet (10 mg total) by mouth daily., Disp: 90 tablet, Rfl: 1   ARIPiprazole (ABILIFY) 30 MG tablet, Take 1 tablet (30 mg total) by mouth daily., Disp: 30 tablet, Rfl: 0   ARIPiprazole (ABILIFY) 5 MG tablet, Take 0.5 tablets (2.5 mg total) by mouth daily for 4 days, THEN 1 tablet (5 mg total) daily., Disp: 30 tablet, Rfl: 1   atomoxetine (STRATTERA) 60 MG capsule, TAKE 1 CAPSULE BY ORAL ROUTE EVERY DAY IN THE MORNING, Disp: 30 capsule, Rfl: 0   benzonatate (TESSALON) 100 MG capsule, Take 1 capsule (100 mg total) by mouth 3 (three) times daily as needed for cough. Do not take with alcohol or while driving or  operating heavy machinery.  May cause drowsiness., Disp: 21 capsule, Rfl: 0   clindamycin (CLEOCIN) 300 MG capsule, Take 1 capsule (300 mg total) by mouth 3 (three) times daily., Disp: 30 capsule, Rfl: 0   hydrochlorothiazide (HYDRODIURIL) 25 MG tablet, Take 1 tablet (25 mg total) by mouth daily., Disp: 90 tablet, Rfl: 1   ibuprofen (ADVIL) 600 MG tablet, Take 1 tablet (600 mg total) by mouth every 6 (six) hours as needed for headache., Disp: 30 tablet, Rfl: 0   lisinopril (ZESTRIL) 20 MG tablet, Take 1 tablet (20 mg total) by mouth daily., Disp: 90 tablet, Rfl: 1   omeprazole (PRILOSEC) 40 MG capsule, Take 1 capsule (40 mg total) by mouth daily., Disp: 90 capsule, Rfl: 2   ondansetron (ZOFRAN ODT) 4 MG disintegrating tablet, Take 1 tablet (4 mg total) by mouth every 8 (eight) hours as needed for nausea or vomiting., Disp: 20 tablet, Rfl: 0   SUMAtriptan (IMITREX) 50 MG tablet, take 1 tablet by mouth at the start of headache as needed. (May repeat in 2 hrs if no relief.  Max 2 tabs/24 hr), Disp: 20 tablet, Rfl: 0   Medications ordered in this encounter:  Meds ordered this encounter  Medications   sulfamethoxazole-trimethoprim (BACTRIM DS) 800-160 MG tablet    Sig: Take 1 tablet by mouth 2 (two) times daily.    Dispense:  10 tablet    Refill:  0    Order Specific Question:   Supervising  Provider    Answer:   Chase Picket [[id number]]   phenazopyridine (PYRIDIUM) 100 MG tablet    Sig: Take 1 tablet (100 mg total) by mouth 3 (three) times daily as needed for pain.    Dispense:  10 tablet    Refill:  0    Order Specific Question:   Supervising Provider    Answer:   Chase Picket [[id number]]   fluconazole (DIFLUCAN) 150 MG tablet    Sig: Take 1 tablet (150 mg total) by mouth every 3 (three) days as needed.    Dispense:  2 tablet    Refill:  0    Order Specific Question:   Supervising Provider    Answer:   Chase Picket [id number]     *If you need refills on other medications prior  to your next appointment, please contact your pharmacy*  Follow-Up: Call back or seek an in-person evaluation if the symptoms worsen or if the condition fails to improve as anticipated.  [place] [phone]  Other Instructions  Urinary Tract Infection, Adult  A urinary tract infection (UTI) is an infection of any part of the urinary tract. The urinary tract includes the kidneys, ureters, bladder, and urethra. These organs make, store, and get rid of urine in the body. An upper UTI affects the ureters and kidneys. A lower UTI affects the bladder and urethra. What are the causes? Most urinary tract infections are caused by bacteria in your genital area around your urethra, where urine leaves your body. These bacteria grow and cause inflammation of your urinary tract. What increases the risk? You are more likely to develop this condition if: You have a urinary catheter that stays in place. You are not able to control when you urinate or have a bowel movement (incontinence). You are female and you: Use a spermicide or diaphragm for birth control. Have low estrogen levels. Are pregnant. You have certain genes that increase your risk. You are sexually active. You take antibiotic medicines. You have a condition that causes your flow of urine to slow down, such as: An enlarged prostate, if you are female. Blockage in your urethra. A kidney stone. A nerve condition that affects your bladder control (neurogenic bladder). Not getting enough to drink, or not urinating often. You have certain medical conditions, such as: Diabetes. A weak disease-fighting system (immunesystem). Sickle cell disease. Gout. Spinal cord injury. What are the signs or symptoms? Symptoms of this condition include: Needing to urinate right away (urgency). Frequent urination. This may include small amounts of urine each time you urinate. Pain or burning with urination. Blood in the urine. Urine  that smells bad or unusual. Trouble urinating. Cloudy urine. Vaginal discharge, if you are female. Pain in the abdomen or the lower back. You may also have: Vomiting or a decreased appetite. Confusion. Irritability or tiredness. A fever or chills. Diarrhea. The first symptom in older adults may be confusion. In some cases, they may not have any symptoms until the infection has worsened. How is this diagnosed? This condition is diagnosed based on your medical history and a physical exam. You may also have other tests, including: Urine tests. Blood tests. Tests for STIs (sexually transmitted infections). If you have had more than one UTI, a cystoscopy or imaging studies may be done to determine the cause of the infections. How is this treated? Treatment for this condition includes: Antibiotic medicine. Over-the-counter medicines to treat discomfort. Drinking enough water to stay hydrated. If  you have frequent infections or have other conditions such as a kidney stone, you may need to see a health care provider who specializes in the urinary tract (urologist). In rare cases, urinary tract infections can cause sepsis. Sepsis is a life-threatening condition that occurs when the body responds to an infection. Sepsis is treated in the hospital with IV antibiotics, fluids, and other medicines. Follow these instructions at home:  Medicines Take over-the-counter and prescription medicines only as told by your health care provider. If you were prescribed an antibiotic medicine, take it as told by your health care provider. Do not stop using the antibiotic even if you start to feel better. General instructions Make sure you: Empty your bladder often and completely. Do not hold urine for long periods of time. Empty your bladder after sex. Wipe from front to back after urinating or having a bowel movement if you are female. Use each tissue only one time when you wipe. Drink enough fluid to keep  your urine pale yellow. Keep all follow-up visits. This is important. Contact a health care provider if: Your symptoms do not get better after 1-2 days. Your symptoms go away and then return. Get help right away if: You have severe pain in your back or your lower abdomen. You have a fever or chills. You have nausea or vomiting. Summary A urinary tract infection (UTI) is an infection of any part of the urinary tract, which includes the kidneys, ureters, bladder, and urethra. Most urinary tract infections are caused by bacteria in your genital area. Treatment for this condition often includes antibiotic medicines. If you were prescribed an antibiotic medicine, take it as told by your health care provider. Do not stop using the antibiotic even if you start to feel better. Keep all follow-up visits. This is important. This information is not intended to replace advice given to you by your health care provider. Make sure you discuss any questions you have with your health care provider. Document Revised: 06/21/2020 Document Reviewed: 06/21/2020 Elsevier Patient Education  [place].    If you have been instructed to have an in-person evaluation today at a local Urgent Care facility, please use the link below. It will take you to a list of all of our available [facility], including address, phone number and hours of operation. Please do not delay care.  [facility]  If you or a family member do not have a primary care provider, use the link below to schedule a visit and establish care. When you choose a [facility] primary care physician or advanced practice provider, you gain a long-term partner in health. Find a Primary Care Provider  Learn more about [facility] in-office and virtual care options: [facility]

## 2022-11-16 ENCOUNTER — Telehealth: Payer: Self-pay | Admitting: Physician Assistant

## 2022-11-16 ENCOUNTER — Encounter: Payer: Self-pay | Admitting: Physician Assistant

## 2022-11-16 DIAGNOSIS — A084 Viral intestinal infection, unspecified: Secondary | ICD-10-CM

## 2022-11-16 MED ORDER — PROMETHAZINE HCL 25 MG RE SUPP: 25.0000 mg | Freq: Four times a day (QID) | RECTAL | 0 refills | Status: DC | PRN

## 2022-11-16 NOTE — Patient Instructions (Signed)
[name], thank you for joining Mar Daring, PA-C for today's virtual visit.  While this provider is not your primary care provider (PCP), if your PCP is located in our provider database this encounter information will be shared with them immediately following your visit.   [facility] account gives you access to today's visit and all your visits, tests, and labs performed at [facility] " click here if you don't have a [facility] account or go to [email or web address]  Consent: (Patient) [name] provided verbal consent for this virtual visit at the beginning of the encounter.  Current Medications:  Current Outpatient Medications:    promethazine (PHENERGAN) 25 MG suppository, Place 1 suppository (25 mg total) rectally every 6 (six) hours as needed for nausea or vomiting., Disp: 12 each, Rfl: 0   amitriptyline (ELAVIL) 50 MG tablet, Take 1/2 tablet by mouth for 4 days. Then take 1 tablet by mouth every night at bedtime, Disp: 30 tablet, Rfl: 0   amLODipine (NORVASC) 10 MG tablet, Take 1 tablet (10 mg total) by mouth daily., Disp: 90 tablet, Rfl: 1   ARIPiprazole (ABILIFY) 30 MG tablet, Take 1 tablet (30 mg total) by mouth daily., Disp: 30 tablet, Rfl: 0   ARIPiprazole (ABILIFY) 5 MG tablet, Take 0.5 tablets (2.5 mg total) by mouth daily for 4 days, THEN 1 tablet (5 mg total) daily., Disp: 30 tablet, Rfl: 1   atomoxetine (STRATTERA) 60 MG capsule, TAKE 1 CAPSULE BY ORAL ROUTE EVERY DAY IN THE MORNING, Disp: 30 capsule, Rfl: 0   benzonatate (TESSALON) 100 MG capsule, Take 1 capsule (100 mg total) by mouth 3 (three) times daily as needed for cough. Do not take with alcohol or while driving or operating heavy machinery.  May cause drowsiness., Disp: 21 capsule, Rfl: 0   clindamycin (CLEOCIN) 300 MG capsule, Take 1 capsule (300 mg total) by mouth 3 (three) times daily., Disp: 30 capsule, Rfl: 0   fluconazole (DIFLUCAN) 150 MG tablet, Take 1  tablet (150 mg total) by mouth every 3 (three) days as needed., Disp: 2 tablet, Rfl: 0   hydrochlorothiazide (HYDRODIURIL) 25 MG tablet, Take 1 tablet (25 mg total) by mouth daily., Disp: 90 tablet, Rfl: 1   ibuprofen (ADVIL) 600 MG tablet, Take 1 tablet (600 mg total) by mouth every 6 (six) hours as needed for headache., Disp: 30 tablet, Rfl: 0   lisinopril (ZESTRIL) 20 MG tablet, Take 1 tablet (20 mg total) by mouth daily., Disp: 90 tablet, Rfl: 1   omeprazole (PRILOSEC) 40 MG capsule, Take 1 capsule (40 mg total) by mouth daily., Disp: 90 capsule, Rfl: 2   phenazopyridine (PYRIDIUM) 100 MG tablet, Take 1 tablet (100 mg total) by mouth 3 (three) times daily as needed for pain., Disp: 10 tablet, Rfl: 0   sulfamethoxazole-trimethoprim (BACTRIM DS) 800-160 MG tablet, Take 1 tablet by mouth 2 (two) times daily., Disp: 10 tablet, Rfl: 0   SUMAtriptan (IMITREX) 50 MG tablet, take 1 tablet by mouth at the start of headache as needed. (May repeat in 2 hrs if no relief.  Max 2 tabs/24 hr), Disp: 20 tablet, Rfl: 0   Medications ordered in this encounter:  Meds ordered this encounter  Medications   promethazine (PHENERGAN) 25 MG suppository    Sig: Place 1 suppository (25 mg total) rectally every 6 (six) hours as needed for nausea or vomiting.    Dispense:  12 each    Refill:  0    Order Specific  Question:   Supervising Provider    Answer:   Chase Picket [[id number]]     *If you need refills on other medications prior to your next appointment, please contact your pharmacy*  Follow-Up: Call back or seek an in-person evaluation if the symptoms worsen or if the condition fails to improve as anticipated.  [place] [phone]  Other Instructions  Viral Gastroenteritis, Adult  Viral gastroenteritis is also known as the stomach flu. This condition may affect your stomach, small intestine, and large intestine. It can cause sudden watery diarrhea, fever, and vomiting. This  condition is caused by many different viruses. These viruses can be passed from person to person very easily (are contagious). Diarrhea and vomiting can make you feel weak and cause you to become dehydrated. You may not be able to keep fluids down. Dehydration can make you tired and thirsty, cause you to have a dry mouth, and decrease how often you urinate. It is important to replace the fluids that you lose from diarrhea and vomiting. What are the causes? Gastroenteritis is caused by many viruses, including rotavirus and norovirus. Norovirus is the most common cause in adults. You can get sick after being exposed to the viruses from other people. You can also get sick by: Eating food, drinking water, or touching a surface contaminated with one of these viruses. Sharing utensils or other personal items with an infected person. What increases the risk? You are more likely to develop this condition if you: Have a weak body defense system (immune system). Live with one or more children who are younger than 2 years. Live in a nursing home. Travel on cruise ships. What are the signs or symptoms? Symptoms of this condition start suddenly 1-3 days after exposure to a virus. Symptoms may last for a few days or for as long as a week. Common symptoms include watery diarrhea and vomiting. Other symptoms include: Fever. Headache. Fatigue. Pain in the abdomen. Chills. Weakness. Nausea. Muscle aches. Loss of appetite. How is this diagnosed? This condition is diagnosed with a medical history and physical exam. You may also have a stool test to check for viruses or other infections. How is this treated? This condition typically goes away on its own. The focus of treatment is to prevent dehydration and restore lost fluids (rehydration). This condition may be treated with: An oral rehydration solution (ORS) to replace important salts and minerals (electrolytes) in your body. Take this if told by your health  care provider. This is a drink that is sold at pharmacies and retail stores. Medicines to help with your symptoms. Probiotic supplements to reduce symptoms of diarrhea. Fluids given through an IV, if dehydration is severe. Older adults and people with other diseases or a weak immune system are at higher risk for dehydration. Follow these instructions at home: Eating and drinking  Take an ORS as told by your health care provider. Drink clear fluids in small amounts as you are able. Clear fluids include: Water. Ice chips. Diluted fruit juice. Low-calorie sports drinks. Drink enough fluid to keep your urine pale yellow. Eat small amounts of healthy foods every 3-4 hours as you are able. This may include whole grains, fruits, vegetables, lean meats, and yogurt. Avoid fluids that contain a lot of sugar or caffeine, such as energy drinks, sports drinks, and soda. Avoid spicy or fatty foods. Avoid alcohol. General instructions  Wash your hands often, especially after having diarrhea or vomiting. If soap and water are  not available, use hand sanitizer. Make sure that all people in your household wash their hands well and often. Take over-the-counter and prescription medicines only as told by your health care provider. Rest at home while you recover. Watch your condition for any changes. Take a warm bath to relieve any burning or pain from frequent diarrhea episodes. Keep all follow-up visits. This is important. Contact a health care provider if you: Cannot keep fluids down. Have symptoms that get worse. Have new symptoms. Feel light-headed or dizzy. Have muscle cramps. Get help right away if you: Have chest pain. Have trouble breathing or you are breathing very quickly. Have a fast heartbeat. Feel extremely weak or you faint. Have a severe headache, a stiff neck, or both. Have a rash. Have severe pain, cramping, or bloating in your abdomen. Have skin that feels cold and  clammy. Feel confused. Have pain when you urinate. Have signs of dehydration, such as: Dark urine, very little urine, or no urine. Cracked lips. Dry mouth. Sunken eyes. Sleepiness. Weakness. Have signs of bleeding, such as: Seeing blood in your vomit. Having vomit that looks like coffee grounds. Having bloody or black stools or stools that look like tar. These symptoms may be an emergency. Get help right away. Call 911. Do not wait to see if the symptoms will go away. Do not drive yourself to the hospital. Summary Viral gastroenteritis is also known as the stomach flu. It can cause sudden watery diarrhea, fever, and vomiting. This condition can be passed from person to person very easily (is contagious). Take an oral rehydration solution (ORS) if told by your health care provider. This is a drink that is sold at pharmacies and retail stores. Wash your hands often, especially after having diarrhea or vomiting. If soap and water are not available, use hand sanitizer. This information is not intended to replace advice given to you by your health care provider. Make sure you discuss any questions you have with your health care provider. Document Revised: 09/08/2021 Document Reviewed: 09/08/2021 Elsevier Patient Education  [place].    If you have been instructed to have an in-person evaluation today at a local Urgent Care facility, please use the link below. It will take you to a list of all of our available [facility], including address, phone number and hours of operation. Please do not delay care.  [facility]  If you or a family member do not have a primary care provider, use the link below to schedule a visit and establish care. When you choose a [facility] primary care physician or advanced practice provider, you gain a long-term partner in health. Find a Primary Care Provider  Learn more about [facility] in-office and virtual care  options: [facility]

## 2022-11-16 NOTE — Progress Notes (Signed)
Virtual Visit Consent   [name], you are scheduled for a virtual visit with a [facility] provider today. Just as with appointments in the office, your consent must be obtained to participate. Your consent will be active for this visit and any virtual visit you may have with one of our providers in the next 365 days. If you have a MyChart account, a copy of this consent can be sent to you electronically.  As this is a virtual visit, video technology does not allow for your provider to perform a traditional examination. This may limit your provider's ability to fully assess your condition. If your provider identifies any concerns that need to be evaluated in person or the need to arrange testing (such as labs, EKG, etc.), we will make arrangements to do so. Although advances in technology are sophisticated, we cannot ensure that it will always work on either your end or our end. If the connection with a video visit is poor, the visit may have to be switched to a telephone visit. With either a video or telephone visit, we are not always able to ensure that we have a secure connection.  By engaging in this virtual visit, you consent to the provision of healthcare and authorize for your insurance to be billed (if applicable) for the services provided during this visit. Depending on your insurance coverage, you may receive a charge related to this service.  I need to obtain your verbal consent now. Are you willing to proceed with your visit today? [name] has provided verbal consent on 11/16/2022 for a virtual visit (video or telephone). Mar Daring, PA-C  Date: 11/16/2022 4:42 PM  Virtual Visit via Video Note   I, Mar Daring, connected with  [name]  ([id number], June 03, 1976) on 11/16/22 at  4:30 PM EST by a video-enabled telemedicine application and verified that I am speaking with the correct person using two identifiers.  Location: Patient: Virtual Visit  Location Patient: Home Provider: Virtual Visit Location Provider: Home Office   I discussed the limitations of evaluation and management by telemedicine and the availability of in person appointments. The patient expressed understanding and agreed to proceed.    History of Present Illness: [name] is a 46 y.o. who identifies as a female who was assigned female at birth, and is being seen today for nausea and vomiting. Was seen 11/13/22 and started on Bactrim and Pyridium for suspected UTI. Today, just over the last hour has had severe nausea and vomiting. Unable to keep down any liquids, including ice. Having associated body aches, fatigue, hot and cold chills. Having dizziness and muscle cramps. Having stomach rumbling. Feels like she has to have a BM, but has been unable.    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[place])    Severe recurrent major depression without psychotic features ([place]) 03/22/2020   Adjustment disorder with mixed anxiety and depressed mood 01/10/2020   Polysubstance abuse ([place]) 01/10/2020   Gastroesophageal reflux disease 03/08/2017   Anxiety 03/08/2017   Anxiety and depression 10/10/2015   History of intravenous drug use in remission 09/07/2011  HCV infection    Essential hypertension    Bipolar disorder ([place])    Bicornuate uterus     Allergies:  Allergies  Allergen Reactions   Keflex [Cephalexin] Shortness Of Breath    Dry mouth and nausea   Hydrocodone-Acetaminophen Nausea Only   Medications:  Current Outpatient Medications:    promethazine  (PHENERGAN) 25 MG suppository, Place 1 suppository (25 mg total) rectally every 6 (six) hours as needed for nausea or vomiting., Disp: 12 each, Rfl: 0   amitriptyline (ELAVIL) 50 MG tablet, Take 1/2 tablet by mouth for 4 days. Then take 1 tablet by mouth every night at bedtime, Disp: 30 tablet, Rfl: 0   amLODipine (NORVASC) 10 MG tablet, Take 1 tablet (10 mg total) by mouth daily., Disp: 90 tablet, Rfl: 1   ARIPiprazole (ABILIFY) 30 MG tablet, Take 1 tablet (30 mg total) by mouth daily., Disp: 30 tablet, Rfl: 0   ARIPiprazole (ABILIFY) 5 MG tablet, Take 0.5 tablets (2.5 mg total) by mouth daily for 4 days, THEN 1 tablet (5 mg total) daily., Disp: 30 tablet, Rfl: 1   atomoxetine (STRATTERA) 60 MG capsule, TAKE 1 CAPSULE BY ORAL ROUTE EVERY DAY IN THE MORNING, Disp: 30 capsule, Rfl: 0   benzonatate (TESSALON) 100 MG capsule, Take 1 capsule (100 mg total) by mouth 3 (three) times daily as needed for cough. Do not take with alcohol or while driving or operating heavy machinery.  May cause drowsiness., Disp: 21 capsule, Rfl: 0   clindamycin (CLEOCIN) 300 MG capsule, Take 1 capsule (300 mg total) by mouth 3 (three) times daily., Disp: 30 capsule, Rfl: 0   fluconazole (DIFLUCAN) 150 MG tablet, Take 1 tablet (150 mg total) by mouth every 3 (three) days as needed., Disp: 2 tablet, Rfl: 0   hydrochlorothiazide (HYDRODIURIL) 25 MG tablet, Take 1 tablet (25 mg total) by mouth daily., Disp: 90 tablet, Rfl: 1   ibuprofen (ADVIL) 600 MG tablet, Take 1 tablet (600 mg total) by mouth every 6 (six) hours as needed for headache., Disp: 30 tablet, Rfl: 0   lisinopril (ZESTRIL) 20 MG tablet, Take 1 tablet (20 mg total) by mouth daily., Disp: 90 tablet, Rfl: 1   omeprazole (PRILOSEC) 40 MG capsule, Take 1 capsule (40 mg total) by mouth daily., Disp: 90 capsule, Rfl: 2   phenazopyridine (PYRIDIUM) 100 MG tablet, Take 1 tablet (100 mg total) by mouth 3 (three) times daily as needed for pain., Disp: 10 tablet, Rfl: 0    sulfamethoxazole-trimethoprim (BACTRIM DS) 800-160 MG tablet, Take 1 tablet by mouth 2 (two) times daily., Disp: 10 tablet, Rfl: 0   SUMAtriptan (IMITREX) 50 MG tablet, take 1 tablet by mouth at the start of headache as needed. (May repeat in 2 hrs if no relief.  Max 2 tabs/24 hr), Disp: 20 tablet, Rfl: 0  Observations/Objective: Patient is well-developed, well-nourished in no acute distress.  Resting comfortably at home.  Head is normocephalic, atraumatic.  No labored breathing.  Speech is clear and coherent with logical content.  Patient is alert and oriented at baseline.    Assessment and Plan: 1. Viral gastroenteritis - promethazine (PHENERGAN) 25 MG suppository; Place 1 suppository (25 mg total) rectally every 6 (six) hours as needed for nausea or vomiting.  Dispense: 12 each; Refill: 0  - Suspect viral gastroenteritis - Phenergan suppositories for nausea - May start Imodium (has at home) if diarrhea becomes significant and having more than 8 BM in 24 hr - Push fluids, electrolyte beverages - Liquid  diet, then increase to soft/bland (BRAT) diet over next day, then increase diet as tolerated - Seek in person evaluation if not improving or symptoms worsen   Follow Up Instructions: I discussed the assessment and treatment plan with the patient. The patient was provided an opportunity to ask questions and all were answered. The patient agreed with the plan and demonstrated an understanding of the instructions.  A copy of instructions were sent to the patient via MyChart unless otherwise noted below.     The patient was advised to call back or seek an in-person evaluation if the symptoms worsen or if the condition fails to improve as anticipated.  Time:  I spent 12 minutes with the patient via telehealth technology discussing the above problems/concerns.    Mar Daring, PA-C

## 2022-11-17 NOTE — Telephone Encounter (Signed)
Patient had virtual appointment to discuss concerns.

## 2022-11-21 ENCOUNTER — Telehealth: Payer: Self-pay | Admitting: Physician Assistant

## 2022-11-21 DIAGNOSIS — R3989 Other symptoms and signs involving the genitourinary system: Secondary | ICD-10-CM

## 2022-11-21 MED ORDER — CEPHALEXIN 500 MG PO CAPS: 500.0000 mg | ORAL_CAPSULE | Freq: Two times a day (BID) | ORAL | 0 refills | Status: DC

## 2022-11-21 MED ORDER — PROMETHAZINE HCL 12.5 MG PO TABS: 12.5000 mg | ORAL_TABLET | Freq: Three times a day (TID) | ORAL | 0 refills | Status: DC | PRN

## 2022-11-21 MED ORDER — FLUCONAZOLE 150 MG PO TABS: 150.0000 mg | ORAL_TABLET | Freq: Once | ORAL | 0 refills | Status: AC

## 2022-11-21 NOTE — Patient Instructions (Signed)
General instructions Make sure you: Pee until your bladder is empty. Do not hold pee for a long time. Empty your bladder after sex. Wipe from front to back after pooping if you are a female. Use each tissue one time when you wipe. Drink enough fluid to keep your pee pale yellow. Keep all follow-up visits as told by your doctor. This is important. Contact a doctor if: You do not get better after 1-2 days. Your symptoms go away and then come back. Get help right away if: You have very bad back pain. You have very bad pain in your lower belly. You have a fever. You are sick to your stomach (nauseous). You are throwing up.

## 2022-11-21 NOTE — Progress Notes (Signed)
Virtual Visit Consent   [name], you are scheduled for a virtual visit with a [facility] provider today. Just as with appointments in the office, your consent must be obtained to participate. Your consent will be active for this visit and any virtual visit you may have with one of our providers in the next 365 days. If you have a MyChart account, a copy of this consent can be sent to you electronically.  As this is a virtual visit, video technology does not allow for your provider to perform a traditional examination. This may limit your provider's ability to fully assess your condition. If your provider identifies any concerns that need to be evaluated in person or the need to arrange testing (such as labs, EKG, etc.), we will make arrangements to do so. Although advances in technology are sophisticated, we cannot ensure that it will always work on either your end or our end. If the connection with a video visit is poor, the visit may have to be switched to a telephone visit. With either a video or telephone visit, we are not always able to ensure that we have a secure connection.  By engaging in this virtual visit, you consent to the provision of healthcare and authorize for your insurance to be billed (if applicable) for the services provided during this visit. Depending on your insurance coverage, you may receive a charge related to this service.  I need to obtain your verbal consent now. Are you willing to proceed with your visit today? [name] has provided verbal consent on 11/21/2022 for a virtual visit (video or telephone). Inda Coke, [place]  Date: 11/21/2022 2:24 PM  Virtual Visit via Video Note   I, Inda Coke, connected with  [name]  ([id number], Aug 15, 1976) on 11/21/22 at  2:15 PM EST by a video-enabled telemedicine application and verified that I am speaking with the correct person using two identifiers.  Location: Patient: Virtual Visit Location  Patient: Home Provider: Virtual Visit Location Provider: Home Office   I discussed the limitations of evaluation and management by telemedicine and the availability of in person appointments. The patient expressed understanding and agreed to proceed.    History of Present Illness: [name] is a 46 y.o. who identifies as a female who was assigned female at birth, and is being seen today for possible kidney infection.  She was seen on 11/13/22 for suspected UTI or pyelo and was prescribed bactrim DS BID x 5 days and pyridium. Patient had severe nausea and vomiting on 11/16/22 while taking this medications -- was told that she likely had suspected gastroenteritis and was prescribed phenergan suppositories.  Her symptoms overall improved for at least 3 days.  For the past 1-2 days, her urinary symptoms have returned. She is having bladder pressure, urinary frequency, and dysuria. She has normal appetite.  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[place])    Severe recurrent major depression without psychotic features ([place]) 03/22/2020   Adjustment disorder with mixed anxiety and depressed mood  01/10/2020   Polysubstance abuse ([place]) 01/10/2020   Gastroesophageal reflux disease 03/08/2017   Anxiety 03/08/2017   Anxiety and depression 10/10/2015   History of intravenous drug use in remission 09/07/2011   HCV infection    Essential hypertension    Bipolar disorder ([place])     Bicornuate uterus     Allergies:  Allergies  Allergen Reactions   Keflex [Cephalexin] Shortness Of Breath    Dry mouth and nausea   Hydrocodone-Acetaminophen Nausea Only   Medications:  Current Outpatient Medications:    cephALEXin (KEFLEX) 500 MG capsule, Take 1 capsule (500 mg total) by mouth 2 (two) times daily., Disp: 14 capsule, Rfl: 0   fluconazole (DIFLUCAN) 150 MG tablet, Take 1 tablet (150 mg total) by mouth once for 1 dose., Disp: 1 tablet, Rfl: 0   promethazine (PHENERGAN) 12.5 MG tablet, Take 1 tablet (12.5 mg total) by mouth every 8 (eight) hours as needed for nausea or vomiting., Disp: 10 tablet, Rfl: 0   amitriptyline (ELAVIL) 50 MG tablet, Take 1/2 tablet by mouth for 4 days. Then take 1 tablet by mouth every night at bedtime, Disp: 30 tablet, Rfl: 0   amLODipine (NORVASC) 10 MG tablet, Take 1 tablet (10 mg total) by mouth daily., Disp: 90 tablet, Rfl: 1   ARIPiprazole (ABILIFY) 30 MG tablet, Take 1 tablet (30 mg total) by mouth daily., Disp: 30 tablet, Rfl: 0   ARIPiprazole (ABILIFY) 5 MG tablet, Take 0.5 tablets (2.5 mg total) by mouth daily for 4 days, THEN 1 tablet (5 mg total) daily., Disp: 30 tablet, Rfl: 1   atomoxetine (STRATTERA) 60 MG capsule, TAKE 1 CAPSULE BY ORAL ROUTE EVERY DAY IN THE MORNING, Disp: 30 capsule, Rfl: 0   benzonatate (TESSALON) 100 MG capsule, Take 1 capsule (100 mg total) by mouth 3 (three) times daily as needed for cough. Do not take with alcohol or while driving or operating heavy machinery.  May cause drowsiness., Disp: 21 capsule, Rfl: 0   clindamycin (CLEOCIN) 300 MG capsule, Take 1 capsule (300 mg total) by mouth 3 (three) times daily., Disp: 30 capsule, Rfl: 0   hydrochlorothiazide (HYDRODIURIL) 25 MG tablet, Take 1 tablet (25 mg total) by mouth daily., Disp: 90 tablet, Rfl: 1   ibuprofen (ADVIL) 600 MG tablet, Take 1 tablet (600 mg total) by mouth every 6 (six) hours as needed for headache., Disp: 30 tablet, Rfl: 0   lisinopril (ZESTRIL) 20  MG tablet, Take 1 tablet (20 mg total) by mouth daily., Disp: 90 tablet, Rfl: 1   omeprazole (PRILOSEC) 40 MG capsule, Take 1 capsule (40 mg total) by mouth daily., Disp: 90 capsule, Rfl: 2   phenazopyridine (PYRIDIUM) 100 MG tablet, Take 1 tablet (100 mg total) by mouth 3 (three) times daily as needed for pain., Disp: 10 tablet, Rfl: 0   SUMAtriptan (IMITREX) 50 MG tablet, take 1 tablet by mouth at the start of headache as needed. (May repeat in 2 hrs if no relief.  Max 2 tabs/24 hr), Disp: 20 tablet, Rfl: 0  Observations/Objective: Patient is well-developed, well-nourished in no acute distress.  Resting comfortably  at home.  Head is normocephalic, atraumatic.  No labored breathing.  Speech is clear and coherent with logical content.  Patient is alert and oriented at baseline.   Assessment and Plan: 1. Suspected UTI No red flags Will treat with oral keflex 500 mg BID x 7 days I have asked her to go ahead and reach out to PCP to follow-up in office  this week to have her urine formally tested given recurrence of symptoms - consider additional vaginal swab or other testing as indicated She has documented keflex allergy however she said it caused nausea only and she is willing to take this again -- I have given rx for phenergan for her to use if needed She has also requested diflucan for antibiotic-associated yeast infection If any worsening sx, she was instructed to go to in person UC or ER in the interim   Follow Up Instructions: I discussed the assessment and treatment plan with the patient. The patient was provided an opportunity to ask questions and all were answered. The patient agreed with the plan and demonstrated an understanding of the instructions.  A copy of instructions were sent to the patient via MyChart unless otherwise noted below.   The patient was advised to call back or seek an in-person evaluation if the symptoms worsen or if the condition fails to improve as  anticipated.  Time:  I spent 5-10 minutes with the patient via telehealth technology discussing the above problems/concerns.    Inda Coke, [place]

## 2022-12-12 ENCOUNTER — Telehealth: Payer: Self-pay | Admitting: Family Medicine

## 2022-12-12 DIAGNOSIS — G43109 Migraine with aura, not intractable, without status migrainosus: Secondary | ICD-10-CM

## 2022-12-12 MED ORDER — SUMATRIPTAN SUCCINATE 50 MG PO TABS: ORAL_TABLET | ORAL | 0 refills | Status: DC

## 2022-12-12 MED ORDER — ONDANSETRON HCL 4 MG PO TABS: 4.0000 mg | ORAL_TABLET | Freq: Three times a day (TID) | ORAL | 0 refills | Status: DC | PRN

## 2022-12-12 NOTE — Patient Instructions (Signed)
Migraine Headache A migraine headache is an intense, throbbing pain on one side or both sides of the head. Migraine headaches may also cause other symptoms, such as nausea, vomiting, and sensitivity to light and noise. A migraine headache can last from 4 hours to 3 days. Talk with your doctor about what things may bring on (trigger) your migraine headaches. What are the causes? The exact cause of this condition is not known. However, a migraine may be caused when nerves in the brain become irritated and release chemicals that cause inflammation of blood vessels. This inflammation causes pain. This condition may be triggered or caused by: Drinking alcohol. Smoking. Taking medicines, such as: Medicine used to treat chest pain (nitroglycerin). Birth control pills. Estrogen. Certain blood pressure medicines. Eating or drinking products that contain nitrates, glutamate, aspartame, or tyramine. Aged cheeses, chocolate, or caffeine may also be triggers. Doing physical activity. Other things that may trigger a migraine headache include: Menstruation. Pregnancy. Hunger. Stress. Lack of sleep or too much sleep. Weather changes. Fatigue. What increases the risk? The following factors may make you more likely to experience migraine headaches: Being a certain age. This condition is more common in people who are 68-5 years old. Being female. Having a family history of migraine headaches. Being Caucasian. Having a mental health condition, such as depression or anxiety. Being obese. What are the signs or symptoms? The main symptom of this condition is pulsating or throbbing pain. This pain may: Happen in any area of the head, such as on one side or both sides. Interfere with daily activities. Get worse with physical activity. Get worse with exposure to bright lights or loud noises. Other symptoms may include: Nausea. Vomiting. Dizziness. General sensitivity to bright lights, loud noises, or  smells. Before you get a migraine headache, you may get warning signs (an aura). An aura may include: Seeing flashing lights or having blind spots. Seeing bright spots, halos, or zigzag lines. Having tunnel vision or blurred vision. Having numbness or a tingling feeling. Having trouble talking. Having muscle weakness. Some people have symptoms after a migraine headache (postdromal phase), such as: Feeling tired. Difficulty concentrating. How is this diagnosed? A migraine headache can be diagnosed based on: Your symptoms. A physical exam. Tests, such as: CT scan or an MRI of the head. These imaging tests can help rule out other causes of headaches. Taking fluid from the spine (lumbar puncture) and analyzing it (cerebrospinal fluid analysis, or CSF analysis). How is this treated? This condition may be treated with medicines that: Relieve pain. Relieve nausea. Prevent migraine headaches. Treatment for this condition may also include: Acupuncture. Lifestyle changes like avoiding foods that trigger migraine headaches. Biofeedback. Cognitive behavioral therapy. Follow these instructions at home: Medicines Take over-the-counter and prescription medicines only as told by your health care provider. Ask your health care provider if the medicine prescribed to you: Requires you to avoid driving or using heavy machinery. Can cause constipation. You may need to take these actions to prevent or treat constipation: Drink enough fluid to keep your urine pale yellow. Take over-the-counter or prescription medicines. Eat foods that are high in fiber, such as beans, whole grains, and fresh fruits and vegetables. Limit foods that are high in fat and processed sugars, such as fried or sweet foods. Lifestyle Do not drink alcohol. Do not use any products that contain nicotine or tobacco, such as cigarettes, e-cigarettes, and chewing tobacco. If you need help quitting, ask your health care  provider. Get at least 8  hours of sleep every night. Find ways to manage stress, such as meditation, deep breathing, or yoga. General instructions Keep a journal to find out what may trigger your migraine headaches. For example, write down: What you eat and drink. How much sleep you get. Any change to your diet or medicines. If you have a migraine headache: Avoid things that make your symptoms worse, such as bright lights. It may help to lie down in a dark, quiet room. Do not drive or use heavy machinery. Ask your health care provider what activities are safe for you while you are experiencing symptoms. Keep all follow-up visits as told by your health care provider. This is important. Contact a health care provider if: You develop symptoms that are different or more severe than your usual migraine headache symptoms. You have more than 15 headache days in one month. Get help right away if: Your migraine headache becomes severe. Your migraine headache lasts longer than 72 hours. You have a fever. You have a stiff neck. You have vision loss. Your muscles feel weak or like you cannot control them. You start to lose your balance often. You have trouble walking. You faint. You have a seizure. Summary A migraine headache is an intense, throbbing pain on one side or both sides of the head. Migraines may also cause other symptoms, such as nausea, vomiting, and sensitivity to light and noise. This condition may be treated with medicines and lifestyle changes. You may also need to avoid certain things that trigger a migraine headache. Keep a journal to find out what may trigger your migraine headaches. Contact your health care provider if you have more than 15 headache days in a month or you develop symptoms that are different or more severe than your usual migraine headache symptoms. This information is not intended to replace advice given to you by your health care provider. Make sure you  discuss any questions you have with your health care provider. Document Revised: 04/23/2022 Document Reviewed: 12/22/2018 Elsevier Patient Education  [place].

## 2022-12-12 NOTE — Progress Notes (Signed)
Virtual Visit Consent   [name], you are scheduled for a virtual visit with a [facility] provider today. Just as with appointments in the office, your consent must be obtained to participate. Your consent will be active for this visit and any virtual visit you may have with one of our providers in the next 365 days. If you have a MyChart account, a copy of this consent can be sent to you electronically.  As this is a virtual visit, video technology does not allow for your provider to perform a traditional examination. This may limit your provider's ability to fully assess your condition. If your provider identifies any concerns that need to be evaluated in person or the need to arrange testing (such as labs, EKG, etc.), we will make arrangements to do so. Although advances in technology are sophisticated, we cannot ensure that it will always work on either your end or our end. If the connection with a video visit is poor, the visit may have to be switched to a telephone visit. With either a video or telephone visit, we are not always able to ensure that we have a secure connection.  By engaging in this virtual visit, you consent to the provision of healthcare and authorize for your insurance to be billed (if applicable) for the services provided during this visit. Depending on your insurance coverage, you may receive a charge related to this service.  I need to obtain your verbal consent now. Are you willing to proceed with your visit today? [name] has provided verbal consent on 12/12/2022 for a virtual visit (video or telephone). Dellia Nims, FNP  Date: 12/12/2022 12:00 PM  Virtual Visit via Video Note   I, Dellia Nims, connected with  [name]  ([id number], 45-Sep-1977) on 12/12/22 at 12:00 PM EST by a video-enabled telemedicine application and verified that I am speaking with the correct person using two identifiers.  Location: Patient: Virtual Visit Location Patient:  Home Provider: Virtual Visit Location Provider: Home Office   I discussed the limitations of evaluation and management by telemedicine and the availability of in person appointments. The patient expressed understanding and agreed to proceed.    History of Present Illness: [name] is a 47 y.o. who identifies as a female who was assigned female at birth, and is being seen today for migraine needing refills on zofran and imitrex until she can see her pcp. She also needs a note for work. She says it feels like her typical migraine and has been present for 2 days. 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[place])    Severe recurrent major depression without psychotic features ([place]) 03/22/2020   Adjustment disorder with mixed anxiety and depressed mood 01/10/2020   Polysubstance abuse ([place]) 01/10/2020   Gastroesophageal reflux disease 03/08/2017   Anxiety 03/08/2017   Anxiety and depression 10/10/2015   History of intravenous drug use in remission 09/07/2011   HCV infection    Essential hypertension    Bipolar disorder ([place])    Bicornuate uterus     Allergies:  Allergies  Allergen Reactions  Keflex [Cephalexin] Shortness Of Breath    Dry mouth and nausea   Hydrocodone-Acetaminophen Nausea Only   Medications:  Current Outpatient Medications:    amitriptyline (ELAVIL) 50 MG tablet, Take 1/2 tablet by mouth for 4 days. Then take 1 tablet by mouth every night at bedtime, Disp: 30 tablet, Rfl: 0   amLODipine (NORVASC) 10 MG tablet, Take 1 tablet (10 mg total) by mouth  daily., Disp: 90 tablet, Rfl: 1   ARIPiprazole (ABILIFY) 30 MG tablet, Take 1 tablet (30 mg total) by mouth daily., Disp: 30 tablet, Rfl: 0   ARIPiprazole (ABILIFY) 5 MG tablet, Take 0.5 tablets (2.5 mg total) by mouth daily for 4 days, THEN 1 tablet (5 mg total) daily., Disp: 30 tablet, Rfl: 1   atomoxetine (STRATTERA) 60 MG capsule, TAKE 1 CAPSULE BY ORAL ROUTE EVERY DAY IN THE MORNING, Disp: 30 capsule, Rfl: 0   benzonatate (TESSALON) 100 MG capsule, Take 1 capsule (100 mg total) by mouth 3 (three) times daily as needed for cough. Do not take with alcohol or while driving or operating heavy machinery.  May cause drowsiness., Disp: 21 capsule, Rfl: 0   cephALEXin (KEFLEX) 500 MG capsule, Take 1 capsule (500 mg total) by mouth 2 (two) times daily., Disp: 14 capsule, Rfl: 0   clindamycin (CLEOCIN) 300 MG capsule, Take 1 capsule (300 mg total) by mouth 3 (three) times daily., Disp: 30 capsule, Rfl: 0   hydrochlorothiazide (HYDRODIURIL) 25 MG tablet, Take 1 tablet (25 mg total) by mouth daily., Disp: 90 tablet, Rfl: 1   ibuprofen (ADVIL) 600 MG tablet, Take 1 tablet (600 mg total) by mouth every 6 (six) hours as needed for headache., Disp: 30 tablet, Rfl: 0   lisinopril (ZESTRIL) 20 MG tablet, Take 1 tablet (20 mg total) by mouth daily., Disp: 90 tablet, Rfl: 1   omeprazole (PRILOSEC) 40 MG capsule, Take 1 capsule (40 mg total) by mouth daily., Disp: 90 capsule, Rfl: 2   ondansetron (ZOFRAN) 4 MG tablet, Take 1 tablet (4 mg total) by mouth every 8 (eight) hours as needed for nausea or vomiting (no pnenergan while on zofran)., Disp: 20 tablet, Rfl: 0   phenazopyridine (PYRIDIUM) 100 MG tablet, Take 1 tablet (100 mg total) by mouth 3 (three) times daily as needed for pain., Disp: 10 tablet, Rfl: 0   promethazine (PHENERGAN) 12.5 MG tablet, Take 1 tablet (12.5 mg total) by mouth every 8 (eight) hours as needed for nausea or vomiting., Disp: 10 tablet, Rfl: 0   SUMAtriptan (IMITREX) 50 MG tablet, take 1  tablet by mouth at the start of headache as needed. (May repeat in 2 hrs if no relief.  Max 2 tabs/24 hr), Disp: 20 tablet, Rfl: 0  Observations/Objective: Patient is well-developed, well-nourished in no acute distress.  Resting comfortably  at home.  Head is normocephalic, atraumatic.  No labored breathing.  Speech is clear and coherent with logical content.  Patient is alert and oriented at baseline.    Assessment and Plan: 1. Migraine with aura and without status migrainosus, not intractable - SUMAtriptan (IMITREX) 50 MG tablet; take 1 tablet by mouth at the start of headache as needed. (May repeat in 2 hrs if no relief.  Max 2 tabs/24 hr)  Dispense: 20 tablet; Refill: 0  Urgent care if sx worsen.   Follow Up Instructions: I discussed the assessment and treatment plan with the patient. The patient was provided an opportunity to ask questions and all were answered. The patient agreed with the  plan and demonstrated an understanding of the instructions.  A copy of instructions were sent to the patient via MyChart unless otherwise noted below.   Patient has requested to receive PHI (AVS, Work Notes, etc) pertaining to this video visit through e-mail as they are currently without active [facility]. They have voiced understand that email is not considered secure and their health information could be viewed by someone other than the patient.   The patient was advised to call back or seek an in-person evaluation if the symptoms worsen or if the condition fails to improve as anticipated.  Time:  I spent 10 minutes with the patient via telehealth technology discussing the above problems/concerns.    Dellia Nims, FNP

## 2022-12-13 ENCOUNTER — Telehealth: Payer: Self-pay | Admitting: Family

## 2022-12-13 DIAGNOSIS — G43109 Migraine with aura, not intractable, without status migrainosus: Secondary | ICD-10-CM

## 2022-12-13 MED ORDER — PROMETHAZINE HCL 12.5 MG PO TABS
12.5000 mg | ORAL_TABLET | Freq: Three times a day (TID) | ORAL | 0 refills | Status: DC | PRN
  Filled 2022-12-13 – 2023-01-04 (×2): qty 10, 4d supply, fill #0

## 2022-12-13 NOTE — Progress Notes (Signed)
Virtual Visit Consent   [name], you are scheduled for a virtual visit with a [facility] provider today. Just as with appointments in the office, your consent must be obtained to participate. Your consent will be active for this visit and any virtual visit you may have with one of our providers in the next 365 days. If you have a MyChart account, a copy of this consent can be sent to you electronically.  As this is a virtual visit, video technology does not allow for your provider to perform a traditional examination. This may limit your provider's ability to fully assess your condition. If your provider identifies any concerns that need to be evaluated in person or the need to arrange testing (such as labs, EKG, etc.), we will make arrangements to do so. Although advances in technology are sophisticated, we cannot ensure that it will always work on either your end or our end. If the connection with a video visit is poor, the visit may have to be switched to a telephone visit. With either a video or telephone visit, we are not always able to ensure that we have a secure connection.  By engaging in this virtual visit, you consent to the provision of healthcare and authorize for your insurance to be billed (if applicable) for the services provided during this visit. Depending on your insurance coverage, you may receive a charge related to this service.  I need to obtain your verbal consent now. Are you willing to proceed with your visit today? [name] has provided verbal consent on 12/13/2022 for a virtual visit (video or telephone). [name], FNP  Date: 12/13/2022 1:20 PM  Virtual Visit via Video Note   I, [name], connected with  [name]  ([id number], 1976/04/25) on 12/13/22 at  1:15 PM EST by a video-enabled telemedicine application and verified that I am speaking with the correct person using two identifiers.  Location: Patient: Virtual Visit Location Patient:  Home Provider: Virtual Visit Location Provider: Home Office   I discussed the limitations of evaluation and management by telemedicine and the availability of in person appointments. The patient expressed understanding and agreed to proceed.    History of Present Illness: [name] is a 47 y.o. who identifies as a female who was assigned female at birth, and is being seen today for headache. Reports a great deal of stress related to her job and caring for her parents.  HPI: Migraine  This is a recurrent problem. The current episode started in the past 7 days (two days ago). The problem occurs intermittently. The problem has been waxing and waning. The pain is located in the Right unilateral region. The pain does not radiate. The pain quality is similar to prior headaches. The pain is at a severity of 6/10. The pain is moderate. Associated symptoms include ear pain, muscle aches, phonophobia, photophobia and sinus pressure. Pertinent negatives include no sore throat. She has tried triptans and NSAIDs for the symptoms. The treatment provided mild relief. Her past medical history is significant for migraine headaches.    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)  Severe recurrent major depression without psychotic features ([place]) 03/22/2020   Adjustment disorder with mixed anxiety and depressed mood 01/10/2020   Polysubstance abuse ([place]) 01/10/2020   Gastroesophageal reflux disease 03/08/2017   Anxiety 03/08/2017   Anxiety and depression 10/10/2015   History of intravenous drug use  in remission 09/07/2011   HCV infection    Essential hypertension    Bipolar disorder ([place])    Bicornuate uterus     Allergies:  Allergies  Allergen Reactions   Keflex [Cephalexin] Shortness Of Breath    Dry mouth and nausea   Hydrocodone-Acetaminophen Nausea Only   Medications:  Current Outpatient Medications:    amitriptyline (ELAVIL) 50 MG tablet, Take 1/2 tablet by mouth for 4 days. Then take 1 tablet by mouth every night at bedtime, Disp: 30 tablet, Rfl: 0   amLODipine (NORVASC) 10 MG tablet, Take 1 tablet (10 mg total) by mouth daily., Disp: 90 tablet, Rfl: 1   ARIPiprazole (ABILIFY) 30 MG tablet, Take 1 tablet (30 mg total) by mouth daily., Disp: 30 tablet, Rfl: 0   ARIPiprazole (ABILIFY) 5 MG tablet, Take 0.5 tablets (2.5 mg total) by mouth daily for 4 days, THEN 1 tablet (5 mg total) daily., Disp: 30 tablet, Rfl: 1   atomoxetine (STRATTERA) 60 MG capsule, TAKE 1 CAPSULE BY ORAL ROUTE EVERY DAY IN THE MORNING, Disp: 30 capsule, Rfl: 0   benzonatate (TESSALON) 100 MG capsule, Take 1 capsule (100 mg total) by mouth 3 (three) times daily as needed for cough. Do not take with alcohol or while driving or operating heavy machinery.  May cause drowsiness., Disp: 21 capsule, Rfl: 0   cephALEXin (KEFLEX) 500 MG capsule, Take 1 capsule (500 mg total) by mouth 2 (two) times daily., Disp: 14 capsule, Rfl: 0   hydrochlorothiazide (HYDRODIURIL) 25 MG tablet, Take 1 tablet (25 mg total) by mouth daily., Disp: 90 tablet, Rfl: 1   ibuprofen (ADVIL) 600 MG tablet, Take 1 tablet (600 mg total) by mouth every 6 (six) hours as needed for headache., Disp: 30 tablet, Rfl: 0   lisinopril (ZESTRIL) 20 MG tablet, Take 1 tablet (20 mg total) by mouth daily., Disp: 90 tablet, Rfl: 1   omeprazole (PRILOSEC) 40 MG capsule, Take 1 capsule (40 mg total) by mouth daily., Disp: 90 capsule, Rfl: 2   ondansetron (ZOFRAN) 4 MG tablet, Take 1 tablet (4 mg total) by mouth every 8 (eight) hours as needed for nausea or  vomiting (no pnenergan while on zofran)., Disp: 20 tablet, Rfl: 0   phenazopyridine (PYRIDIUM) 100 MG tablet, Take 1 tablet (100 mg total) by mouth 3 (three) times daily as needed for pain., Disp: 10 tablet, Rfl: 0   promethazine (PHENERGAN) 12.5 MG tablet, Take 1 tablet (12.5 mg total) by mouth every 8 (eight) hours as needed for nausea or vomiting., Disp: 10 tablet, Rfl: 0   SUMAtriptan (IMITREX) 50 MG tablet, take 1 tablet by mouth at the start of headache as needed. (May repeat in 2 hrs if no relief.  Max 2 tabs/24 hr), Disp: 20 tablet, Rfl: 0  Observations/Objective: Patient is well-developed, well-nourished in no acute distress.  Resting comfortably  at home.  Head is normocephalic, atraumatic.  No labored breathing.  Speech is clear and coherent with logical content.  Patient is alert and oriented at baseline.    Assessment and Plan: 1. Migraine with aura and without status migrainosus, not intractable - promethazine (PHENERGAN) 12.5 MG tablet; Take 1 tablet (12.5 mg total) by mouth every 8 (  eight) hours as needed for nausea or vomiting.  Dispense: 10 tablet; Refill: 0  Imitrex as needed Call PCP tomorrow to make follow up Zofran as needed  Work note given  Stress management  Encourage stress management and sleep  Follow if symptoms worsen or do not improve   Follow Up Instructions: I discussed the assessment and treatment plan with the patient. The patient was provided an opportunity to ask questions and all were answered. The patient agreed with the plan and demonstrated an understanding of the instructions.  A copy of instructions were sent to the patient via MyChart unless otherwise noted below.     The patient was advised to call back or seek an in-person evaluation if the symptoms worsen or if the condition fails to improve as anticipated.  Time:  I spent 11 minutes with the patient via telehealth technology discussing the above problems/concerns.    [name],  FNP

## 2022-12-14 ENCOUNTER — Telehealth: Payer: Self-pay | Admitting: Family Medicine

## 2022-12-14 ENCOUNTER — Telehealth: Payer: Self-pay | Admitting: Physician Assistant

## 2022-12-14 ENCOUNTER — Other Ambulatory Visit: Payer: Self-pay

## 2022-12-14 DIAGNOSIS — G44229 Chronic tension-type headache, not intractable: Secondary | ICD-10-CM

## 2022-12-14 MED ORDER — CYCLOBENZAPRINE HCL 10 MG PO TABS: 5.0000 mg | ORAL_TABLET | Freq: Three times a day (TID) | ORAL | 0 refills | Status: DC | PRN

## 2022-12-14 NOTE — Patient Instructions (Signed)
Fonda Rochon, thank you for joining Mar Daring, PA-C for today's virtual visit.  While this provider is not your primary care provider (PCP), if your PCP is located in our provider database this encounter information will be shared with them immediately following your visit.   [facility] account gives you access to today's visit and all your visits, tests, and labs performed at [facility] " click here if you don't have a [place] account or go to [email or web address]  Consent: (Patient) [name] provided verbal consent for this virtual visit at the beginning of the encounter.  Current Medications:  Current Outpatient Medications:    cyclobenzaprine (FLEXERIL) 10 MG tablet, Take 0.5-1 tablets (5-10 mg total) by mouth 3 (three) times daily as needed., Disp: 30 tablet, Rfl: 0   amitriptyline (ELAVIL) 50 MG tablet, Take 1/2 tablet by mouth for 4 days. Then take 1 tablet by mouth every night at bedtime, Disp: 30 tablet, Rfl: 0   amLODipine (NORVASC) 10 MG tablet, Take 1 tablet (10 mg total) by mouth daily., Disp: 90 tablet, Rfl: 1   ARIPiprazole (ABILIFY) 30 MG tablet, Take 1 tablet (30 mg total) by mouth daily., Disp: 30 tablet, Rfl: 0   ARIPiprazole (ABILIFY) 5 MG tablet, Take 0.5 tablets (2.5 mg total) by mouth daily for 4 days, THEN 1 tablet (5 mg total) daily., Disp: 30 tablet, Rfl: 1   atomoxetine (STRATTERA) 60 MG capsule, TAKE 1 CAPSULE BY ORAL ROUTE EVERY DAY IN THE MORNING, Disp: 30 capsule, Rfl: 0   benzonatate (TESSALON) 100 MG capsule, Take 1 capsule (100 mg total) by mouth 3 (three) times daily as needed for cough. Do not take with alcohol or while driving or operating heavy machinery.  May cause drowsiness., Disp: 21 capsule, Rfl: 0   cephALEXin (KEFLEX) 500 MG capsule, Take 1 capsule (500 mg total) by mouth 2 (two) times daily., Disp: 14 capsule, Rfl: 0   hydrochlorothiazide (HYDRODIURIL) 25 MG tablet, Take 1 tablet (25 mg  total) by mouth daily., Disp: 90 tablet, Rfl: 1   ibuprofen (ADVIL) 600 MG tablet, Take 1 tablet (600 mg total) by mouth every 6 (six) hours as needed for headache., Disp: 30 tablet, Rfl: 0   lisinopril (ZESTRIL) 20 MG tablet, Take 1 tablet (20 mg total) by mouth daily., Disp: 90 tablet, Rfl: 1   omeprazole (PRILOSEC) 40 MG capsule, Take 1 capsule (40 mg total) by mouth daily., Disp: 90 capsule, Rfl: 2   ondansetron (ZOFRAN) 4 MG tablet, Take 1 tablet (4 mg total) by mouth every 8 (eight) hours as needed for nausea or vomiting (no pnenergan while on zofran)., Disp: 20 tablet, Rfl: 0   phenazopyridine (PYRIDIUM) 100 MG tablet, Take 1 tablet (100 mg total) by mouth 3 (three) times daily as needed for pain., Disp: 10 tablet, Rfl: 0   promethazine (PHENERGAN) 12.5 MG tablet, Take 1 tablet (12.5 mg total) by mouth every 8 (eight) hours as needed for nausea or vomiting., Disp: 10 tablet, Rfl: 0   SUMAtriptan (IMITREX) 50 MG tablet, take 1 tablet by mouth at the start of headache as needed. (May repeat in 2 hrs if no relief.  Max 2 tabs/24 hr), Disp: 20 tablet, Rfl: 0   Medications ordered in this encounter:  Meds ordered this encounter  Medications   cyclobenzaprine (FLEXERIL) 10 MG tablet    Sig: Take 0.5-1 tablets (5-10 mg total) by mouth 3 (three) times daily as needed.    Dispense:  30 tablet  Refill:  0    Order Specific Question:   Supervising Provider    Answer:   Chase Picket [[id number]]     *If you need refills on other medications prior to your next appointment, please contact your pharmacy*  Follow-Up: Call back or seek an in-person evaluation if the symptoms worsen or if the condition fails to improve as anticipated.  [facility] [phone]  Other Instructions  Tension Headache, Adult A tension headache is a feeling of pain, pressure, or aching over the front and sides of the head. The pain can be dull, or it can feel tight. There are two types of tension  headache: Episodic tension headache. This is when the headaches happen fewer than 15 days a month. Chronic tension headache. This is when the headaches happen more than 15 days a month during a 32-monthperiod. A tension headache can last from 30 minutes to several days. It is the most common kind of headache. Tension headaches are not normally associated with nausea or vomiting, and they do not get worse with physical activity. What are the causes? The exact cause of this condition is not known. Tension headaches are often triggered by stress, anxiety, or depression. Other triggers may include: Alcohol. Too much caffeine or caffeine withdrawal. Respiratory infections, such as colds, flu, or sinus infections. Dental problems or teeth clenching. Fatigue. Holding your head and neck in the same position for a long period of time, such as while using a computer. Smoking. Arthritis of the neck. What are the signs or symptoms? Symptoms of this condition include: A feeling of pressure or tightness around the head. Dull, aching head pain. Pain over the front and sides of the head. Tenderness in the muscles of the head, neck, and shoulders. How is this diagnosed? This condition may be diagnosed based on your symptoms, your medical history, and a physical exam. If your symptoms are severe or unusual, you may have imaging tests, such as a CT scan or an MRI of your head. Your vision may also be checked. How is this treated? This condition may be treated with lifestyle changes and with medicines that help relieve symptoms. Follow these instructions at home: Managing pain Take over-the-counter and prescription medicines only as told by your health care provider. When you have a headache, lie down in a dark, quiet room. If directed, put ice on your head and neck. To do this: Put ice in a plastic bag. Place a towel between your skin and the bag. Leave the ice on for 20 minutes, 2-3 times a  day. Remove the ice if your skin turns bright red. This is very important. If you cannot feel pain, heat, or cold, you have a greater risk of damage to the area. If directed, apply heat to the back of your neck as often as told by your health care provider. Use the heat source that your health care provider recommends, such as a moist heat pack or a heating pad. Place a towel between your skin and the heat source. Leave the heat on for 20-30 minutes. Remove the heat if your skin turns bright red. This is especially important if you are unable to feel pain, heat, or cold. You have a greater risk of getting burned. Eating and drinking Eat meals on a regular schedule. If you drink alcohol: Limit how much you have to: 0-1 drink a day for women who are not pregnant. 0-2 drinks a day for men. Know how  much alcohol is in your drink. In the [place] one drink equals one 12 oz bottle of beer (355 mL), one 5 oz glass of wine (148 mL), or one 1 oz glass of hard liquor (44 mL). Drink enough fluid to keep your urine pale yellow. Decrease your caffeine intake, or stop using caffeine. Lifestyle Get 7-9 hours of sleep each night, or get the amount of sleep recommended by your health care provider. At bedtime, remove computers, phones, and tablets from your room. Find ways to manage your stress. This may include: Exercise. Deep breathing exercises. Yoga. Listening to music. Positive mental imagery. Try to sit up straight and avoid tensing your muscles. Do not use any products that contain nicotine or tobacco. These include cigarettes, chewing tobacco, and vaping devices, such as e-cigarettes. If you need help quitting, ask your health care provider. General instructions  Avoid any headache triggers. Keep a journal to help find out what may trigger your headaches. For example, write down: What you eat and drink. How much sleep you get. Any change to your diet or medicines. Keep all follow-up visits. This  is important. Contact a health care provider if: Your headache does not get better. Your headache comes back. You are sensitive to sounds, light, or smells because of a headache. You have nausea or you vomit. Your stomach hurts. Get help right away if: You suddenly develop a severe headache, along with any of the following: A stiff neck. Nausea and vomiting. Confusion. Weakness in one part or one side of your body. Double vision or loss of vision. Shortness of breath. Rash. Unusual sleepiness. Fever or chills. Trouble speaking. Pain in your eye or ear. Trouble walking or balancing. Feeling faint or passing out. Summary A tension headache is a feeling of pain, pressure, or aching over the front and sides of the head. A tension headache can last from 30 minutes to several days. It is the most common kind of headache. This condition may be diagnosed based on your symptoms, your medical history, and a physical exam. This condition may be treated with lifestyle changes and with medicines that help relieve symptoms. This information is not intended to replace advice given to you by your health care provider. Make sure you discuss any questions you have with your health care provider. Document Revised: 08/08/2020 Document Reviewed: 08/08/2020 Elsevier Patient Education  [place].    If you have been instructed to have an in-person evaluation today at a local Urgent Care facility, please use the link below. It will take you to a list of all of our available [facility], including address, phone number and hours of operation. Please do not delay care.  [facility]  If you or a family member do not have a primary care provider, use the link below to schedule a visit and establish care. When you choose a [facility] primary care physician or advanced practice provider, you gain a long-term partner in health. Find a Primary Care Provider  Learn more  about [facility]'s in-office and virtual care options: [facility]

## 2022-12-14 NOTE — Progress Notes (Signed)
The patient no-showed for appointment despite this provider sending direct link, reaching out via phone with no response and waiting for at least 10 minutes from appointment time for patient to join. They will be marked as a NS for this appointment/time.   Debbie [place] M Kostantinos Tallman, NP    

## 2022-12-14 NOTE — Progress Notes (Signed)
Virtual Visit Consent   [name], you are scheduled for a virtual visit with a [facility] provider today. Just as with appointments in the office, your consent must be obtained to participate. Your consent will be active for this visit and any virtual visit you may have with one of our providers in the next 365 days. If you have a MyChart account, a copy of this consent can be sent to you electronically.  As this is a virtual visit, video technology does not allow for your provider to perform a traditional examination. This may limit your provider's ability to fully assess your condition. If your provider identifies any concerns that need to be evaluated in person or the need to arrange testing (such as labs, EKG, etc.), we will make arrangements to do so. Although advances in technology are sophisticated, we cannot ensure that it will always work on either your end or our end. If the connection with a video visit is poor, the visit may have to be switched to a telephone visit. With either a video or telephone visit, we are not always able to ensure that we have a secure connection.  By engaging in this virtual visit, you consent to the provision of healthcare and authorize for your insurance to be billed (if applicable) for the services provided during this visit. Depending on your insurance coverage, you may receive a charge related to this service.  I need to obtain your verbal consent now. Are you willing to proceed with your visit today? [name] has provided verbal consent on 12/14/2022 for a virtual visit (video or telephone). Mar Daring, PA-C  Date: 12/14/2022 3:06 PM  Virtual Visit via Video Note   I, Mar Daring, connected with  [name]  ([id number], [birth year]) on 12/14/22 at  2:30 PM EST by a video-enabled telemedicine application and verified that I am speaking with the correct person using two identifiers.  Location: Patient: Virtual Visit  Location Patient: Home Provider: Virtual Visit Location Provider: Home Office   I discussed the limitations of evaluation and management by telemedicine and the availability of in person appointments. The patient expressed understanding and agreed to proceed.    History of Present Illness: [name] is a 47 y.o. who identifies as a female who was assigned female at birth, and is being seen today for continued headache. Was seen on 12/12/22, and 12/13/22 for same complaint. Had Imitrex started and zofran then phenergan prescribed for nausea associated. She has had no improvement in symptoms. She feels her headache is more tension and stress related and that is triggering her migraine.     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[place])    Severe recurrent major depression without psychotic features ([place]) 03/22/2020   Adjustment disorder with mixed anxiety and depressed mood 01/10/2020   Polysubstance abuse ([place]) 01/10/2020   Gastroesophageal reflux disease 03/08/2017   Anxiety 03/08/2017   Anxiety and depression 10/10/2015   History of intravenous drug use in remission 09/07/2011   HCV infection    Essential hypertension    Bipolar disorder ([place])    Bicornuate uterus  Allergies:  Allergies  Allergen Reactions   Keflex [Cephalexin] Shortness Of Breath    Dry mouth and nausea   Hydrocodone-Acetaminophen Nausea Only   Medications:  Current Outpatient Medications:    cyclobenzaprine (FLEXERIL) 10 MG tablet, Take 0.5-1 tablets (5-10 mg total) by mouth 3 (three) times daily as  needed., Disp: 30 tablet, Rfl: 0   amitriptyline (ELAVIL) 50 MG tablet, Take 1/2 tablet by mouth for 4 days. Then take 1 tablet by mouth every night at bedtime, Disp: 30 tablet, Rfl: 0   amLODipine (NORVASC) 10 MG tablet, Take 1 tablet (10 mg total) by mouth daily., Disp: 90 tablet, Rfl: 1   ARIPiprazole (ABILIFY) 30 MG tablet, Take 1 tablet (30 mg total) by mouth daily., Disp: 30 tablet, Rfl: 0   ARIPiprazole (ABILIFY) 5 MG tablet, Take 0.5 tablets (2.5 mg total) by mouth daily for 4 days, THEN 1 tablet (5 mg total) daily., Disp: 30 tablet, Rfl: 1   atomoxetine (STRATTERA) 60 MG capsule, TAKE 1 CAPSULE BY ORAL ROUTE EVERY DAY IN THE MORNING, Disp: 30 capsule, Rfl: 0   benzonatate (TESSALON) 100 MG capsule, Take 1 capsule (100 mg total) by mouth 3 (three) times daily as needed for cough. Do not take with alcohol or while driving or operating heavy machinery.  May cause drowsiness., Disp: 21 capsule, Rfl: 0   cephALEXin (KEFLEX) 500 MG capsule, Take 1 capsule (500 mg total) by mouth 2 (two) times daily., Disp: 14 capsule, Rfl: 0   hydrochlorothiazide (HYDRODIURIL) 25 MG tablet, Take 1 tablet (25 mg total) by mouth daily., Disp: 90 tablet, Rfl: 1   ibuprofen (ADVIL) 600 MG tablet, Take 1 tablet (600 mg total) by mouth every 6 (six) hours as needed for headache., Disp: 30 tablet, Rfl: 0   lisinopril (ZESTRIL) 20 MG tablet, Take 1 tablet (20 mg total) by mouth daily., Disp: 90 tablet, Rfl: 1   omeprazole (PRILOSEC) 40 MG capsule, Take 1 capsule (40 mg total) by mouth daily., Disp: 90 capsule, Rfl: 2   ondansetron (ZOFRAN) 4 MG tablet, Take 1 tablet (4 mg total) by mouth every 8 (eight) hours as needed for nausea or vomiting (no pnenergan while on zofran)., Disp: 20 tablet, Rfl: 0   phenazopyridine (PYRIDIUM) 100 MG tablet, Take 1 tablet (100 mg total) by mouth 3 (three) times daily as needed for pain., Disp: 10 tablet, Rfl: 0   promethazine (PHENERGAN) 12.5 MG tablet, Take 1 tablet (12.5 mg total) by mouth  every 8 (eight) hours as needed for nausea or vomiting., Disp: 10 tablet, Rfl: 0   SUMAtriptan (IMITREX) 50 MG tablet, take 1 tablet by mouth at the start of headache as needed. (May repeat in 2 hrs if no relief.  Max 2 tabs/24 hr), Disp: 20 tablet, Rfl: 0  Observations/Objective: Patient is well-developed, well-nourished in no acute distress.  Resting comfortably at home.  Head is normocephalic, atraumatic.  No labored breathing.  Speech is clear and coherent with logical content.  Patient is alert and oriented at baseline.    Assessment and Plan: 1. Chronic tension-type headache, not intractable - cyclobenzaprine (FLEXERIL) 10 MG tablet; Take 0.5-1 tablets (5-10 mg total) by mouth 3 (three) times daily as needed.  Dispense: 30 tablet; Refill: 0  - Will treat for tension type headache as this has not been tried as of yet - Add Cyclobenzaprine - May use ibuprofen and tylenol but try to limit use to avoid rebound headaches - Heat to neck - Light stretches for neck and  shoulders - Seek in person evaluation if this does not relieve headache  Follow Up Instructions: I discussed the assessment and treatment plan with the patient. The patient was provided an opportunity to ask questions and all were answered. The patient agreed with the plan and demonstrated an understanding of the instructions.  A copy of instructions were sent to the patient via MyChart unless otherwise noted below.    The patient was advised to call back or seek an in-person evaluation if the symptoms worsen or if the condition fails to improve as anticipated.  Time:  I spent 15 minutes with the patient via telehealth technology discussing the above problems/concerns.    Mar Daring, PA-C

## 2022-12-21 ENCOUNTER — Other Ambulatory Visit: Payer: Self-pay

## 2023-01-01 ENCOUNTER — Other Ambulatory Visit: Payer: Self-pay

## 2023-01-04 ENCOUNTER — Other Ambulatory Visit: Payer: Self-pay

## 2023-01-04 ENCOUNTER — Other Ambulatory Visit: Payer: Self-pay | Admitting: Physician Assistant

## 2023-01-04 DIAGNOSIS — G44229 Chronic tension-type headache, not intractable: Secondary | ICD-10-CM

## 2023-01-07 ENCOUNTER — Other Ambulatory Visit: Payer: Self-pay

## 2023-01-11 ENCOUNTER — Other Ambulatory Visit: Payer: Self-pay

## 2023-01-15 ENCOUNTER — Ambulatory Visit: Payer: BC Managed Care – PPO | Admitting: Internal Medicine

## 2023-01-20 ENCOUNTER — Other Ambulatory Visit: Payer: Self-pay

## 2023-01-21 ENCOUNTER — Other Ambulatory Visit: Payer: Self-pay

## 2023-01-27 ENCOUNTER — Ambulatory Visit: Payer: Self-pay | Admitting: Nurse Practitioner

## 2023-01-27 ENCOUNTER — Telehealth: Payer: Self-pay | Admitting: Physician Assistant

## 2023-01-27 NOTE — Progress Notes (Signed)
The patient no-showed for appointment despite this provider sending direct link x 2 with no response and waiting for at least 10 minutes from appointment time for patient to join. They will be marked as a NS for this appointment/time.   Mar Daring, PA-C

## 2023-02-03 ENCOUNTER — Other Ambulatory Visit: Payer: Self-pay

## 2023-02-03 ENCOUNTER — Telehealth: Payer: Medicaid Other | Admitting: Nurse Practitioner

## 2023-02-03 DIAGNOSIS — N760 Acute vaginitis: Secondary | ICD-10-CM | POA: Diagnosis not present

## 2023-02-03 DIAGNOSIS — B9689 Other specified bacterial agents as the cause of diseases classified elsewhere: Secondary | ICD-10-CM | POA: Diagnosis not present

## 2023-02-03 DIAGNOSIS — N3000 Acute cystitis without hematuria: Secondary | ICD-10-CM

## 2023-02-03 MED ORDER — NITROFURANTOIN MONOHYD MACRO 100 MG PO CAPS
100.0000 mg | ORAL_CAPSULE | Freq: Two times a day (BID) | ORAL | 0 refills | Status: AC
  Filled 2023-02-03: qty 10, 5d supply, fill #0

## 2023-02-03 MED ORDER — METRONIDAZOLE 0.75 % VA GEL
1.0000 | Freq: Every day | VAGINAL | 0 refills | Status: AC
  Filled 2023-02-03: qty 70, 7d supply, fill #0

## 2023-02-03 NOTE — Progress Notes (Signed)
I see you have scheduled a Video Visit this afternoon and that is a great idea since multiple things are going on. I will cancel this so you are not double charged.   All the best [name]

## 2023-02-03 NOTE — Progress Notes (Signed)
Virtual Visit Consent   [name], you are scheduled for a virtual visit with a [facility] provider today. Just as with appointments in the office, your consent must be obtained to participate. Your consent will be active for this visit and any virtual visit you may have with one of our providers in the next 365 days. If you have a MyChart account, a copy of this consent can be sent to you electronically.  As this is a virtual visit, video technology does not allow for your provider to perform a traditional examination. This may limit your provider's ability to fully assess your condition. If your provider identifies any concerns that need to be evaluated in person or the need to arrange testing (such as labs, EKG, etc.), we will make arrangements to do so. Although advances in technology are sophisticated, we cannot ensure that it will always work on either your end or our end. If the connection with a video visit is poor, the visit may have to be switched to a telephone visit. With either a video or telephone visit, we are not always able to ensure that we have a secure connection.  By engaging in this virtual visit, you consent to the provision of healthcare and authorize for your insurance to be billed (if applicable) for the services provided during this visit. Depending on your insurance coverage, you may receive a charge related to this service.  I need to obtain your verbal consent now. Are you willing to proceed with your visit today? [name] has provided verbal consent on 02/03/2023 for a virtual visit (video or telephone). [name], FNP  Date: 02/03/2023 12:30 PM  Virtual Visit via Video Note   I, [name], connected with  [name]  ([id number], 12-20-1975) on 02/03/23 at 12:30 PM EDT by a video-enabled telemedicine application and verified that I am speaking with the correct person using two identifiers.  Location: Patient: Virtual Visit Location Patient:  Home Provider: Virtual Visit Location Provider: Home Office   I discussed the limitations of evaluation and management by telemedicine and the availability of in person appointments. The patient expressed understanding and agreed to proceed.    History of Present Illness: [name] is a 47 y.o. who identifies as a female who was assigned female at birth, and is being seen today for vaginal discomfort and discharge   Vaginal itching with some white discharge no odor  It has been a few months since her most recent antibiotic   Feels the burning on her vaginal skin when urinating   She has burning when urinating  Denies increased frequency in urination  She does have urinary urgency   She has taken 4 Diflucan over the past 2 weeks  For potential yeast infection  Does not currently have insurance  She is sexually active     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   Substance induced mood disorder ([place]) 07/15/2020   Pica 05/21/2020   Prediabetes 05/21/2020   Alcohol use disorder, severe, dependence ([place]) 05/21/2020   Diarrhea 03/24/2020   Hypokalemia 03/24/2020   Iron deficiency anemia 03/24/2020   MDD (major depressive disorder), recurrent, severe, with psychosis ([place]) 03/23/2020   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[place])    Severe recurrent major depression without psychotic features ([place]) 03/22/2020   Adjustment disorder with mixed anxiety and depressed mood 01/10/2020   Polysubstance abuse ([place]) 01/10/2020   Gastroesophageal reflux disease 03/08/2017   Anxiety 03/08/2017  Anxiety and depression 10/10/2015   History of intravenous drug use in remission 09/07/2011   HCV infection    Essential hypertension    Bipolar disorder ([place])    Bicornuate uterus     Allergies:  Allergies  Allergen Reactions   Keflex [Cephalexin] Shortness Of Breath    Dry mouth and nausea   Hydrocodone-Acetaminophen Nausea Only    Medications:  Current Outpatient Medications:    amitriptyline (ELAVIL) 50 MG tablet, Take 1/2 tablet by mouth for 4 days. Then take 1 tablet by mouth every night at bedtime, Disp: 30 tablet, Rfl: 0   amLODipine (NORVASC) 10 MG tablet, Take 1 tablet (10 mg total) by mouth daily., Disp: 90 tablet, Rfl: 1   ARIPiprazole (ABILIFY) 30 MG tablet, Take 1 tablet (30 mg total) by mouth daily., Disp: 30 tablet, Rfl: 0   ARIPiprazole (ABILIFY) 5 MG tablet, Take 0.5 tablets (2.5 mg total) by mouth daily for 4 days, THEN 1 tablet (5 mg total) daily., Disp: 30 tablet, Rfl: 1   atomoxetine (STRATTERA) 60 MG capsule, TAKE 1 CAPSULE BY ORAL ROUTE EVERY DAY IN THE MORNING, Disp: 30 capsule, Rfl: 0   benzonatate (TESSALON) 100 MG capsule, Take 1 capsule (100 mg total) by mouth 3 (three) times daily as needed for cough. Do not take with alcohol or while driving or operating heavy machinery.  May cause drowsiness., Disp: 21 capsule, Rfl: 0   cephALEXin (KEFLEX) 500 MG capsule, Take 1 capsule (500 mg total) by mouth 2 (two) times daily., Disp: 14 capsule, Rfl: 0   cyclobenzaprine (FLEXERIL) 10 MG tablet, Take 0.5-1 tablets (5-10 mg total) by mouth 3 (three) times daily as needed., Disp: 30 tablet, Rfl: 0   hydrochlorothiazide (HYDRODIURIL) 25 MG tablet, Take 1 tablet (25 mg total) by mouth daily., Disp: 90 tablet, Rfl: 1   ibuprofen (ADVIL) 600 MG tablet, Take 1 tablet (600 mg total) by mouth every 6 (six) hours as needed for headache., Disp: 30 tablet, Rfl: 0   lisinopril (ZESTRIL) 20 MG tablet, Take 1 tablet (20 mg total) by mouth daily., Disp: 90 tablet, Rfl: 1   omeprazole (PRILOSEC) 40 MG capsule, Take 1 capsule (40 mg total) by mouth daily., Disp: 90 capsule, Rfl: 2   ondansetron (ZOFRAN) 4 MG tablet, Take 1 tablet (4 mg total) by mouth every 8 (eight) hours as needed for nausea or vomiting (no pnenergan while on zofran)., Disp: 20 tablet, Rfl: 0   phenazopyridine (PYRIDIUM) 100 MG tablet, Take 1 tablet (100 mg  total) by mouth 3 (three) times daily as needed for pain., Disp: 10 tablet, Rfl: 0   promethazine (PHENERGAN) 12.5 MG tablet, Take 1 tablet (12.5 mg total) by mouth every 8 (eight) hours as needed for nausea or vomiting., Disp: 10 tablet, Rfl: 0   SUMAtriptan (IMITREX) 50 MG tablet, take 1 tablet by mouth at the start of headache as needed. (May repeat in 2 hrs if no relief.  Max 2 tabs/24 hr), Disp: 20 tablet, Rfl: 0  Observations/Objective: Patient is well-developed, well-nourished in no acute distress.  Resting comfortably  at home.  Head is normocephalic, atraumatic.  No labored breathing.  Speech is clear and coherent with logical content.  Patient is alert and oriented at baseline.    Assessment and Plan: 1. Acute cystitis without hematuria  - nitrofurantoin, macrocrystal-monohydrate, (MACROBID) 100 MG capsule; Take 1 capsule (100 mg total) by mouth 2 (two) times daily for 5 days.  Dispense: 10 capsule; Refill: 0  2. BV (bacterial  vaginosis)  - metroNIDAZOLE (METROGEL) 0.75 % vaginal gel; Place 1 Applicatorful vaginally at bedtime for 7 days.  Dispense: 70 g; Refill: 0    Call [facility] medical for appointment STI testing, vaginal swab, Urine Culture today  Patient agrees to plan   Needs in person testing prior to more virtual management to assure treatment course moving forward   Follow Up Instructions: I discussed the assessment and treatment plan with the patient. The patient was provided an opportunity to ask questions and all were answered. The patient agreed with the plan and demonstrated an understanding of the instructions.  A copy of instructions were sent to the patient via MyChart unless otherwise noted below.    The patient was advised to call back or seek an in-person evaluation if the symptoms worsen or if the condition fails to improve as anticipated.  Time:  I spent 20 minutes with the patient via telehealth technology discussing the above  problems/concerns.    [name], FNP

## 2023-02-04 ENCOUNTER — Other Ambulatory Visit: Payer: Self-pay

## 2023-02-25 ENCOUNTER — Ambulatory Visit: Payer: Self-pay | Admitting: Nurse Practitioner

## 2023-03-01 DIAGNOSIS — F411 Generalized anxiety disorder: Secondary | ICD-10-CM | POA: Insufficient documentation

## 2023-03-03 ENCOUNTER — Other Ambulatory Visit: Payer: Self-pay

## 2023-03-03 MED ORDER — BUSPIRONE HCL 10 MG PO TABS
10.0000 mg | ORAL_TABLET | Freq: Three times a day (TID) | ORAL | 0 refills | Status: DC
  Filled 2023-03-03: qty 90, 30d supply, fill #0

## 2023-03-03 MED ORDER — NALTREXONE HCL 50 MG PO TABS
50.0000 mg | ORAL_TABLET | Freq: Every day | ORAL | 0 refills | Status: DC
  Filled 2023-03-03 – 2023-03-10 (×4): qty 30, 30d supply, fill #0

## 2023-03-03 MED ORDER — ARIPIPRAZOLE 5 MG PO TABS
5.0000 mg | ORAL_TABLET | Freq: Every day | ORAL | 0 refills | Status: DC
  Filled 2023-03-03: qty 30, 30d supply, fill #0

## 2023-03-03 MED ORDER — SERTRALINE HCL 50 MG PO TABS
50.0000 mg | ORAL_TABLET | Freq: Every day | ORAL | 0 refills | Status: DC
  Filled 2023-03-03: qty 90, 90d supply, fill #0

## 2023-03-09 ENCOUNTER — Other Ambulatory Visit: Payer: Self-pay

## 2023-03-10 ENCOUNTER — Other Ambulatory Visit: Payer: Self-pay

## 2023-03-12 ENCOUNTER — Other Ambulatory Visit: Payer: Self-pay

## 2023-03-12 ENCOUNTER — Encounter: Payer: Self-pay | Admitting: Nurse Practitioner

## 2023-03-12 ENCOUNTER — Ambulatory Visit: Payer: Medicaid Other | Attending: Nurse Practitioner | Admitting: Nurse Practitioner

## 2023-03-12 VITALS — BP 150/96 | HR 97 | Ht 62.0 in | Wt 158.4 lb

## 2023-03-12 DIAGNOSIS — T148XXA Other injury of unspecified body region, initial encounter: Secondary | ICD-10-CM

## 2023-03-12 DIAGNOSIS — F32A Depression, unspecified: Secondary | ICD-10-CM | POA: Insufficient documentation

## 2023-03-12 DIAGNOSIS — Z1231 Encounter for screening mammogram for malignant neoplasm of breast: Secondary | ICD-10-CM

## 2023-03-12 DIAGNOSIS — R790 Abnormal level of blood mineral: Secondary | ICD-10-CM

## 2023-03-12 DIAGNOSIS — R7689 Other specified abnormal immunological findings in serum: Secondary | ICD-10-CM

## 2023-03-12 DIAGNOSIS — S0031XA Abrasion of nose, initial encounter: Secondary | ICD-10-CM | POA: Diagnosis not present

## 2023-03-12 DIAGNOSIS — R7303 Prediabetes: Secondary | ICD-10-CM | POA: Diagnosis not present

## 2023-03-12 DIAGNOSIS — D649 Anemia, unspecified: Secondary | ICD-10-CM | POA: Diagnosis not present

## 2023-03-12 DIAGNOSIS — R768 Other specified abnormal immunological findings in serum: Secondary | ICD-10-CM

## 2023-03-12 DIAGNOSIS — Z1211 Encounter for screening for malignant neoplasm of colon: Secondary | ICD-10-CM

## 2023-03-12 DIAGNOSIS — Z79899 Other long term (current) drug therapy: Secondary | ICD-10-CM | POA: Insufficient documentation

## 2023-03-12 DIAGNOSIS — R760 Raised antibody titer: Secondary | ICD-10-CM | POA: Diagnosis not present

## 2023-03-12 DIAGNOSIS — G43109 Migraine with aura, not intractable, without status migrainosus: Secondary | ICD-10-CM

## 2023-03-12 DIAGNOSIS — F419 Anxiety disorder, unspecified: Secondary | ICD-10-CM

## 2023-03-12 DIAGNOSIS — K219 Gastro-esophageal reflux disease without esophagitis: Secondary | ICD-10-CM

## 2023-03-12 DIAGNOSIS — I10 Essential (primary) hypertension: Secondary | ICD-10-CM

## 2023-03-12 DIAGNOSIS — X58XXXA Exposure to other specified factors, initial encounter: Secondary | ICD-10-CM | POA: Insufficient documentation

## 2023-03-12 DIAGNOSIS — S0031XS Abrasion of nose, sequela: Secondary | ICD-10-CM

## 2023-03-12 MED ORDER — SUMATRIPTAN SUCCINATE 50 MG PO TABS
ORAL_TABLET | ORAL | 3 refills | Status: DC
Start: 2023-03-12 — End: 2023-04-17
  Filled 2023-03-12: qty 9, 30d supply, fill #0

## 2023-03-12 MED ORDER — HYDROCHLOROTHIAZIDE 25 MG PO TABS
25.0000 mg | ORAL_TABLET | Freq: Every day | ORAL | 1 refills | Status: AC
Start: 2023-03-12 — End: ?
  Filled 2023-03-12 – 2023-04-14 (×2): qty 90, 90d supply, fill #0
  Filled 2023-10-19: qty 90, 90d supply, fill #1
  Filled 2023-10-19: qty 30, 30d supply, fill #1
  Filled 2023-10-19: qty 90, 90d supply, fill #1
  Filled 2023-10-20: qty 30, 30d supply, fill #1

## 2023-03-12 MED ORDER — ONDANSETRON HCL 8 MG PO TABS
8.0000 mg | ORAL_TABLET | Freq: Three times a day (TID) | ORAL | 3 refills | Status: DC | PRN
Start: 2023-03-12 — End: 2023-04-17
  Filled 2023-03-12: qty 30, 10d supply, fill #0

## 2023-03-12 MED ORDER — OMEPRAZOLE 40 MG PO CPDR
40.0000 mg | DELAYED_RELEASE_CAPSULE | Freq: Every day | ORAL | 2 refills | Status: DC
Start: 2023-03-12 — End: 2023-10-18
  Filled 2023-03-12: qty 90, 90d supply, fill #0
  Filled 2023-04-14: qty 30, 30d supply, fill #0
  Filled 2023-06-01: qty 90, 90d supply, fill #1
  Filled 2023-06-01: qty 30, 30d supply, fill #1
  Filled 2023-09-03: qty 90, 90d supply, fill #2

## 2023-03-12 MED ORDER — AMLODIPINE BESYLATE 10 MG PO TABS
10.0000 mg | ORAL_TABLET | Freq: Every day | ORAL | 1 refills | Status: AC
Start: 2023-03-12 — End: ?
  Filled 2023-03-12 – 2023-04-14 (×2): qty 90, 90d supply, fill #0
  Filled 2023-10-19: qty 90, 90d supply, fill #1
  Filled 2023-10-19: qty 30, 30d supply, fill #1
  Filled 2023-10-19: qty 90, 90d supply, fill #1
  Filled 2023-10-20: qty 30, 30d supply, fill #1

## 2023-03-12 MED ORDER — LISINOPRIL 20 MG PO TABS
20.0000 mg | ORAL_TABLET | Freq: Every day | ORAL | 1 refills | Status: AC
Start: 2023-03-12 — End: ?
  Filled 2023-03-12 – 2023-04-14 (×2): qty 90, 90d supply, fill #0
  Filled 2023-10-19 (×2): qty 90, 90d supply, fill #1
  Filled 2023-10-20: qty 30, 30d supply, fill #1

## 2023-03-12 MED ORDER — MUPIROCIN 2 % EX OINT
1.0000 | TOPICAL_OINTMENT | Freq: Two times a day (BID) | CUTANEOUS | 1 refills | Status: DC
Start: 2023-03-12 — End: 2023-04-17
  Filled 2023-03-12: qty 22, 11d supply, fill #0

## 2023-03-12 NOTE — Progress Notes (Unsigned)
Assessment & Plan:  [name] was seen today for hypertension.  Diagnoses and all orders for this visit:  Primary hypertension Poorly controlled. Needs to restart medications today -     amLODipine (NORVASC) 10 MG tablet; Take 1 tablet (10 mg total) by mouth daily. -     hydrochlorothiazide (HYDRODIURIL) 25 MG tablet; Take 1 tablet (25 mg total) by mouth daily. -     CMP14+EGFR -     lisinopril (ZESTRIL) 20 MG tablet; Take 1 tablet (20 mg total) by mouth daily.  Prediabetes -     CMP14+EGFR -     Hemoglobin A1c  Positive hepatitis C antibody test Lost to follow up for PCR after positive hep c screening -     HCV RNA quant  Migraine with aura and without status migrainosus, not intractable Phenergan dc'd due to history of prolonged QT interval.  -     SUMAtriptan (IMITREX) 50 MG tablet; take 1 tablet by mouth at the start of headache as needed. (May repeat in 2 hrs if no relief.  Max 2 tabs/24 hr) -     ondansetron (ZOFRAN) 8 MG tablet; Take 1 tablet (8 mg total) by mouth every 8 (eight) hours as needed for nausea or vomiting.  Low magnesium level -     Magnesium  Anxiety and depression Declines restarting any medications for this -     Ambulatory referral to Behavioral Health  Colon cancer screening -     Ambulatory referral to Gastroenterology  Anemia, unspecified type -     CBC with Differential  Skin abrasion -     mupirocin ointment (BACTROBAN) 2 %; Apply 1 Application topically 2 (two) times daily. Apply to nose  Breast cancer screening by mammogram -     MM 3D SCREENING MAMMOGRAM BILATERAL BREAST; Future  GERD without esophagitis -     omeprazole (PRILOSEC) 40 MG capsule; Take 1 capsule (40 mg total) by mouth daily. INSTRUCTIONS: Avoid GERD Triggers: acidic, spicy or fried foods, caffeine, coffee, sodas,  alcohol and chocolate.      Patient has been counseled on age-appropriate routine health concerns for screening and prevention. These are reviewed and  up-to-date. Referrals have been placed accordingly. Immunizations are up-to-date or declined.    Subjective:   Chief Complaint  Patient presents with   Hypertension   HPI [name] 47 y.o. female presents to office today for medication refills and follow up to HTN. She has not been seen in this office by her PCP in quite some time (07-30-2022)  Pt with hx of GERD, anxiety, depression, bipolar, migraines, HTN, preDM, hep C, history of polysubstance abuse (methamphetamine, cocaine, heroin in remission on these, alcohol use disorder, IDA/PICA.   DRUG SCREEN positive for THC only on 02-28-2023  HTN  Blood pressure is not controlled. She is out of her BP meds: HCTZ 25 mg daily,  lisinopril 20 mg  and amlodipine 10 mg daily.  BP Readings from Last 3 Encounters:  03/12/23 (!) 150/96  11/04/22 125/83  10/13/22 (!) 143/113    Prediabetes Well controlled. She is currently not taking any diabetes medication.  Lab Results  Component Value Date   HGBA1C 6.0 (H) 03/12/2023     Review of Systems  Constitutional:  Negative for fever, malaise/fatigue and weight loss.  HENT: Negative.  Negative for nosebleeds.   Eyes: Negative.  Negative for blurred vision, double vision and photophobia.  Respiratory: Negative.  Negative for cough and shortness of breath.   Cardiovascular:  Negative.  Negative for chest pain, palpitations and leg swelling.  Gastrointestinal:  Positive for heartburn. Negative for abdominal pain, blood in stool, constipation, diarrhea, melena, nausea and vomiting.  Musculoskeletal: Negative.  Negative for myalgias.  Skin:  Positive for rash.  Neurological: Negative.  Negative for dizziness, focal weakness, seizures and headaches.  Psychiatric/Behavioral:  Positive for substance abuse. Negative for suicidal ideas.     Past Medical History:  Diagnosis Date   Abnormal Pap smear 1998   had colpo   Anxiety    Asthma    ,as child only   no inhaler   Bicornate uterus     Bicornuate uterus    Chlamydia 2000   GERD (gastroesophageal reflux disease)    HCV infection    HX of Hep C   Headache(784.0)    Hypertension 2010   IUP (intrauterine pregnancy), incidental 13 weeks   Opiate dependence    stopped methadone 01/2011   PONV (postoperative nausea and vomiting)    STD (female)    HX of STD's    Past Surgical History:  Procedure Laterality Date   BILATERAL SALPINGECTOMY  12/06/2012   Procedure: BILATERAL SALPINGECTOMY;  Surgeon: Catalina Antigua, MD;  Location: [facility];  Service: Gynecology;  Laterality: Bilateral;  laparoscopic   CERVICAL CERCLAGE  09/01/2011   Procedure: CERCLAGE CERVICAL;  Surgeon: Reva Bores, MD;  Location: [facility];  Service: Gynecology;  Laterality: N/A;   CERVICAL CERCLAGE  06/28/2012   Procedure: CERCLAGE CERVICAL;  Surgeon: Reva Bores, MD;  Location: [facility];  Service: Gynecology;  Laterality: N/A;   DILATION AND CURETTAGE OF UTERUS     SAB   FINGER SURGERY  2003   reattachment of right forefinger   THERAPEUTIC ABORTION     THERAPEUTIC ABORTION     TONSILLECTOMY     TUBAL LIGATION  11/23/2012    Family History  Problem Relation Age of Onset   Hypertension Mother    Mental illness Mother        depression; alot of mental illness.   Cancer Father 27       Colon cancer age 86   Thyroid disease Maternal Grandmother    Diabetes Paternal Grandfather    Anesthesia problems Neg Hx    Other Neg Hx     Social History Reviewed with no changes to be made today.   Outpatient Medications Prior to Visit  Medication Sig Dispense Refill   naltrexone (DEPADE) 50 MG tablet Take 1 tablet (50 mg total) by mouth daily. 90 tablet 0   promethazine (PHENERGAN) 12.5 MG tablet Take 1 tablet (12.5 mg total) by mouth every 8 (eight) hours as needed for nausea or vomiting. 10 tablet 0   amLODipine (NORVASC) 10 MG tablet Take 1 tablet (10 mg total) by mouth daily. 90 tablet 1   hydrochlorothiazide (HYDRODIURIL) 25 MG tablet Take 1 tablet (25 mg  total) by mouth daily. 90 tablet 1   ibuprofen (ADVIL) 600 MG tablet Take 1 tablet (600 mg total) by mouth every 6 (six) hours as needed for headache. 30 tablet 0   lisinopril (ZESTRIL) 20 MG tablet Take 1 tablet (20 mg total) by mouth daily. 90 tablet 1   omeprazole (PRILOSEC) 40 MG capsule Take 1 capsule (40 mg total) by mouth daily. 90 capsule 2   amitriptyline (ELAVIL) 50 MG tablet Take 1/2 tablet by mouth for 4 days. Then take 1 tablet by mouth every night at bedtime (Patient not taking: Reported on 03/12/2023) 30 tablet  0   ARIPiprazole (ABILIFY) 30 MG tablet Take 1 tablet (30 mg total) by mouth daily. (Patient not taking: Reported on 03/12/2023) 30 tablet 0   ARIPiprazole (ABILIFY) 5 MG tablet Take 0.5 tablets (2.5 mg total) by mouth daily for 4 days, THEN 1 tablet (5 mg total) daily. 30 tablet 1   ARIPiprazole (ABILIFY) 5 MG tablet Take 1 tablet (5 mg total) by mouth daily. (Patient not taking: Reported on 03/12/2023) 30 tablet 0   atomoxetine (STRATTERA) 60 MG capsule TAKE 1 CAPSULE BY ORAL ROUTE EVERY DAY IN THE MORNING 30 capsule 0   benzonatate (TESSALON) 100 MG capsule Take 1 capsule (100 mg total) by mouth 3 (three) times daily as needed for cough. Do not take with alcohol or while driving or operating heavy machinery.  May cause drowsiness. (Patient not taking: Reported on 03/12/2023) 21 capsule 0   busPIRone (BUSPAR) 10 MG tablet Take 1 tablet (10 mg total) by mouth 3 (three) times daily. (Patient not taking: Reported on 03/12/2023) 90 tablet 0   cephALEXin (KEFLEX) 500 MG capsule Take 1 capsule (500 mg total) by mouth 2 (two) times daily. (Patient not taking: Reported on 03/12/2023) 14 capsule 0   cyclobenzaprine (FLEXERIL) 10 MG tablet Take 0.5-1 tablets (5-10 mg total) by mouth 3 (three) times daily as needed. (Patient not taking: Reported on 03/12/2023) 30 tablet 0   ondansetron (ZOFRAN) 4 MG tablet Take 1 tablet (4 mg total) by mouth every 8 (eight) hours as needed for nausea or vomiting  (no pnenergan while on zofran). (Patient not taking: Reported on 03/12/2023) 20 tablet 0   phenazopyridine (PYRIDIUM) 100 MG tablet Take 1 tablet (100 mg total) by mouth 3 (three) times daily as needed for pain. (Patient not taking: Reported on 03/12/2023) 10 tablet 0   sertraline (ZOLOFT) 50 MG tablet Take 1 tablet (50 mg total) by mouth daily. (Patient not taking: Reported on 03/12/2023) 90 tablet 0   SUMAtriptan (IMITREX) 50 MG tablet take 1 tablet by mouth at the start of headache as needed. (May repeat in 2 hrs if no relief.  Max 2 tabs/24 hr) (Patient not taking: Reported on 03/12/2023) 20 tablet 0   No facility-administered medications prior to visit.    Allergies  Allergen Reactions   Hydrocodone-Acetaminophen Nausea Only and Other (See Comments)       Objective:    BP (!) 150/96   Pulse 97   Ht 5\' 2"  (1.575 m)   Wt 158 lb 6.4 oz (71.8 kg)   LMP 02/22/2023 (Exact Date)   SpO2 98%   BMI 28.97 kg/m  Wt Readings from Last 3 Encounters:  03/12/23 158 lb 6.4 oz (71.8 kg)  07/30/22 165 lb 3.2 oz (74.9 kg)  06/12/21 168 lb 3.2 oz (76.3 kg)    Physical Exam Vitals and nursing note reviewed.  Constitutional:      Appearance: She is well-developed.  HENT:     Head: Normocephalic and atraumatic.  Cardiovascular:     Rate and Rhythm: Normal rate and regular rhythm.     Heart sounds: Normal heart sounds. No murmur heard.    No friction rub. No gallop.  Pulmonary:     Effort: Pulmonary effort is normal. No tachypnea or respiratory distress.     Breath sounds: Normal breath sounds. No decreased breath sounds, wheezing, rhonchi or rales.  Chest:     Chest wall: No tenderness.  Abdominal:     General: Bowel sounds are normal.     Palpations:  Abdomen is soft.  Musculoskeletal:        General: Normal range of motion.     Cervical back: Normal range of motion.  Skin:    General: Skin is warm and dry.  Neurological:     Mental Status: She is alert and oriented to person, place,  and time.     Coordination: Coordination normal.  Psychiatric:        Behavior: Behavior normal. Behavior is cooperative.        Thought Content: Thought content normal.        Judgment: Judgment normal.          Patient has been counseled extensively about nutrition and exercise as well as the importance of adherence with medications and regular follow-up. The patient was given clear instructions to go to ER or return to medical center if symptoms don't improve, worsen or new problems develop. The patient verbalized understanding.   Follow-up: Return in about 6 weeks (around 04/23/2023) for DR Laural Benes: HTN.   Claiborne Rigg, FNP-BC [facility] [place], [place] [phone]   03/13/2023, 7:10 PM

## 2023-03-13 ENCOUNTER — Encounter: Payer: Self-pay | Admitting: Nurse Practitioner

## 2023-03-13 LAB — CBC WITH DIFFERENTIAL/PLATELET
Basos: 1 %
Eos: 1 %
Hemoglobin: 10.2 g/dL — ABNORMAL LOW (ref 11.1–15.9)
Immature Granulocytes: 0 %
MCH: 21.8 pg — ABNORMAL LOW (ref 26.6–33.0)
Monocytes: 10 %
RDW: 16.7 % — ABNORMAL HIGH (ref 11.7–15.4)
WBC: 7.7 10*3/uL (ref 3.4–10.8)

## 2023-03-13 LAB — CMP14+EGFR
AST: 17 IU/L (ref 0–40)
Albumin/Globulin Ratio: 1.9 (ref 1.2–2.2)
BUN/Creatinine Ratio: 13 (ref 9–23)
Chloride: 103 mmol/L (ref 96–106)
Creatinine, Ser: 0.55 mg/dL — ABNORMAL LOW (ref 0.57–1.00)
Sodium: 139 mmol/L (ref 134–144)
Total Protein: 7 g/dL (ref 6.0–8.5)

## 2023-03-14 LAB — HCV RNA QUANT: Hepatitis C Quantitation: NOT DETECTED IU/mL

## 2023-03-14 LAB — CMP14+EGFR
ALT: 16 IU/L (ref 0–32)
Albumin: 4.6 g/dL (ref 3.9–4.9)
Alkaline Phosphatase: 62 IU/L (ref 44–121)
BUN: 7 mg/dL (ref 6–24)
Bilirubin Total: <0.2 mg/dL (ref 0.0–1.2)
CO2: 22 mmol/L (ref 20–29)
Calcium: 9.6 mg/dL (ref 8.7–10.2)
Globulin, Total: 2.4 g/dL (ref 1.5–4.5)
Glucose: 86 mg/dL (ref 70–99)
Potassium: 3.7 mmol/L (ref 3.5–5.2)
eGFR: 116 mL/min/{1.73_m2} (ref >59)

## 2023-03-14 LAB — CBC WITH DIFFERENTIAL/PLATELET
Basophils Absolute: 0.1 10*3/uL (ref 0.0–0.2)
EOS (ABSOLUTE): 0.1 10*3/uL (ref 0.0–0.4)
Hematocrit: 32.8 % — ABNORMAL LOW (ref 34.0–46.6)
Immature Grans (Abs): 0 10*3/uL (ref 0.0–0.1)
Lymphocytes Absolute: 1.8 10*3/uL (ref 0.7–3.1)
Lymphs: 23 %
MCHC: 31.1 g/dL — ABNORMAL LOW (ref 31.5–35.7)
MCV: 70 fL — ABNORMAL LOW (ref 79–97)
Monocytes Absolute: 0.8 10*3/uL (ref 0.1–0.9)
Neutrophils Absolute: 5 10*3/uL (ref 1.4–7.0)
Neutrophils: 65 %
Platelets: 326 10*3/uL (ref 150–450)
RBC: 4.67 x10E6/uL (ref 3.77–5.28)

## 2023-03-14 LAB — HEMOGLOBIN A1C
Est. average glucose Bld gHb Est-mCnc: 126 mg/dL
Hgb A1c MFr Bld: 6 % — ABNORMAL HIGH (ref 4.8–5.6)

## 2023-03-14 LAB — MAGNESIUM: Magnesium: 1.6 mg/dL (ref 1.6–2.3)

## 2023-03-15 ENCOUNTER — Other Ambulatory Visit: Payer: Self-pay

## 2023-03-23 ENCOUNTER — Other Ambulatory Visit: Payer: Self-pay

## 2023-03-24 ENCOUNTER — Other Ambulatory Visit: Payer: Self-pay

## 2023-04-03 ENCOUNTER — Telehealth: Payer: Self-pay

## 2023-04-03 ENCOUNTER — Telehealth: Payer: Medicaid Other | Admitting: Family Medicine

## 2023-04-03 DIAGNOSIS — J019 Acute sinusitis, unspecified: Secondary | ICD-10-CM

## 2023-04-03 DIAGNOSIS — B9689 Other specified bacterial agents as the cause of diseases classified elsewhere: Secondary | ICD-10-CM

## 2023-04-03 DIAGNOSIS — J301 Allergic rhinitis due to pollen: Secondary | ICD-10-CM | POA: Diagnosis not present

## 2023-04-03 MED ORDER — FLUTICASONE PROPIONATE 50 MCG/ACT NA SUSP
2.0000 | Freq: Every day | NASAL | 0 refills | Status: DC
  Filled 2023-04-03: qty 16, 30d supply, fill #0

## 2023-04-03 MED ORDER — DOXYCYCLINE HYCLATE 100 MG PO TABS
100.0000 mg | ORAL_TABLET | Freq: Two times a day (BID) | ORAL | 0 refills | Status: AC
  Filled 2023-04-03 – 2023-04-30 (×2): qty 20, 10d supply, fill #0

## 2023-04-03 MED ORDER — FEXOFENADINE HCL 180 MG PO TABS
180.0000 mg | ORAL_TABLET | Freq: Every day | ORAL | 0 refills | Status: DC
  Filled 2023-04-03: qty 30, 30d supply, fill #0

## 2023-04-03 NOTE — Patient Instructions (Signed)

## 2023-04-03 NOTE — Progress Notes (Signed)
Virtual Visit Consent   [name], you are scheduled for a virtual visit with a [facility] provider today. Just as with appointments in the office, your consent must be obtained to participate. Your consent will be active for this visit and any virtual visit you may have with one of our providers in the next 365 days. If you have a MyChart account, a copy of this consent can be sent to you electronically.  As this is a virtual visit, video technology does not allow for your provider to perform a traditional examination. This may limit your provider's ability to fully assess your condition. If your provider identifies any concerns that need to be evaluated in person or the need to arrange testing (such as labs, EKG, etc.), we will make arrangements to do so. Although advances in technology are sophisticated, we cannot ensure that it will always work on either your end or our end. If the connection with a video visit is poor, the visit may have to be switched to a telephone visit. With either a video or telephone visit, we are not always able to ensure that we have a secure connection.  By engaging in this virtual visit, you consent to the provision of healthcare and authorize for your insurance to be billed (if applicable) for the services provided during this visit. Depending on your insurance coverage, you may receive a charge related to this service.  I need to obtain your verbal consent now. Are you willing to proceed with your visit today? [name] has provided verbal consent on 04/03/2023 for a virtual visit (video or telephone). Debbie Curio, FNP  Date: 04/03/2023 12:32 PM  Virtual Visit via Video Note   I, [name], connected with  [name]  ([id number], 1976/09/07) on 04/03/23 at 12:30 PM EDT by a video-enabled telemedicine application and verified that I am speaking with the correct person using two identifiers.  Location: Patient: Virtual Visit Location Patient:  Home Provider: Virtual Visit Location Provider: Home Office   I discussed the limitations of evaluation and management by telemedicine and the availability of in person appointments. The patient expressed understanding and agreed to proceed.    History of Present Illness: [name] is a 47 y.o. who identifies as a female who was assigned female at birth, and is being seen today for sinus pressure and pain, headache, post nasal drainage, worse with allergy problems for several weeks. Also requests work note. 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   Severe alcohol use disorder (HCC) 07/01/2020   Major depressive disorder, recurrent severe without psychotic features (HCC) 07/01/2020   Pica 05/21/2020   Prediabetes 05/21/2020   Alcohol use disorder, severe, dependence (HCC) 05/21/2020   Attention-deficit hyperactivity disorder, unspecified type 04/01/2020   Bipolar disorder, unspecified (HCC) 04/01/2020   Diarrhea 03/24/2020   Hypokalemia 03/24/2020   Iron deficiency anemia 03/24/2020   MDD (major depressive disorder), recurrent, severe, 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HCC)    Severe recurrent major depression without psychotic features (HCC) 03/22/2020   Adjustment disorder with mixed anxiety and depressed mood 01/10/2020   Marihuana abuse 06/16/2019   Nicotine dependence, unspecified, uncomplicated 06/16/2019   Gastroesophageal reflux disease 03/08/2017   Anxiety 03/08/2017   Opioid dependence with withdrawal (HCC) 09/24/2016  Cocaine abuse (HCC) 09/24/2016   Serotonin syndrome 09/17/2016   Altered mental status 09/17/2016   Bicornuate uterus 09/17/2016   Benign essential HTN 09/17/2016   Anxiety and depression 10/10/2015   History  of intravenous drug use in remission 09/07/2011   Essential hypertension    Bipolar disorder (HCC)    Bicornuate uterus     Allergies:  Allergies  Allergen Reactions   Hydrocodone-Acetaminophen Nausea Only and Other (See Comments)   Medications:  Current Outpatient Medications:    doxycycline (VIBRA-TABS) 100 MG tablet, Take 1 tablet (100 mg total) by mouth 2 (two) times daily for 10 days., Disp: 20 tablet, Rfl: 0   fexofenadine (ALLEGRA ALLERGY) 180 MG tablet, Take 1 tablet (180 mg total) by mouth daily., Disp: 30 tablet, Rfl: 0   fluticasone (FLONASE) 50 MCG/ACT nasal spray, Place 2 sprays into both nostrils daily., Disp: 16 g, Rfl: 0   amLODipine (NORVASC) 10 MG tablet, Take 1 tablet (10 mg total) by mouth daily., Disp: 90 tablet, Rfl: 1   hydrochlorothiazide (HYDRODIURIL) 25 MG tablet, Take 1 tablet (25 mg total) by mouth daily., Disp: 90 tablet, Rfl: 1   lisinopril (ZESTRIL) 20 MG tablet, Take 1 tablet (20 mg total) by mouth daily., Disp: 90 tablet, Rfl: 1   mupirocin ointment (BACTROBAN) 2 %, Apply 1 Application topically 2 (two) times daily. Apply to nose, Disp: 22 g, Rfl: 1   naltrexone (DEPADE) 50 MG tablet, Take 1 tablet (50 mg total) by mouth daily., Disp: 90 tablet, Rfl: 0   omeprazole (PRILOSEC) 40 MG capsule, Take 1 capsule (40 mg total) by mouth daily., Disp: 90 capsule, Rfl: 2   ondansetron (ZOFRAN) 8 MG tablet, Take 1 tablet (8 mg total) by mouth every 8 (eight) hours as needed for nausea or vomiting., Disp: 30 tablet, Rfl: 3   SUMAtriptan (IMITREX) 50 MG tablet, take 1 tablet by mouth at the start of headache as needed. (May repeat in 2 hrs if no relief.  Max 2 tabs/24 hr), Disp: 20 tablet, Rfl: 3  Observations/Objective: Patient is well-developed, well-nourished in no acute distress.  Resting comfortably  at home.  Head is normocephalic, atraumatic.  No labored breathing.  Speech is clear and coherent with logical content.  Patient is alert and oriented at baseline.     Assessment and Plan: 1. Acute bacterial sinusitis  2. Seasonal allergic rhinitis due to pollen  Increase fluids, UC if sx worsen.   Follow Up Instructions: I discussed the assessment and treatment plan with the patient. The patient was provided an opportunity to ask questions and all were answered. The patient agreed with the plan and demonstrated an understanding of the instructions.  A copy of instructions were sent to the patient via MyChart unless otherwise noted below.     The patient was advised to call back or seek an in-person evaluation if the symptoms worsen or if the condition fails to improve as anticipated.  Time:  I spent 10 minutes with the patient via telehealth technology discussing the above problems/concerns.    Debbie Curio, FNP

## 2023-04-05 ENCOUNTER — Other Ambulatory Visit: Payer: Self-pay

## 2023-04-12 ENCOUNTER — Other Ambulatory Visit: Payer: Self-pay

## 2023-04-14 ENCOUNTER — Other Ambulatory Visit: Payer: Self-pay

## 2023-04-17 ENCOUNTER — Telehealth: Payer: Managed Care, Other (non HMO) | Admitting: Family

## 2023-04-17 DIAGNOSIS — A084 Viral intestinal infection, unspecified: Secondary | ICD-10-CM

## 2023-04-17 MED ORDER — ONDANSETRON HCL 4 MG PO TABS
4.0000 mg | ORAL_TABLET | Freq: Three times a day (TID) | ORAL | 0 refills | Status: DC | PRN
  Filled 2023-04-17: qty 20, 7d supply, fill #0

## 2023-04-17 NOTE — Patient Instructions (Signed)

## 2023-04-17 NOTE — Progress Notes (Signed)
Virtual Visit Consent   [name], you are scheduled for a virtual visit with a [facility] provider today. Just as with appointments in the office, your consent must be obtained to participate. Your consent will be active for this visit and any virtual visit you may have with one of our providers in the next 365 days. If you have a MyChart account, a copy of this consent can be sent to you electronically.  As this is a virtual visit, video technology does not allow for your provider to perform a traditional examination. This may limit your provider's ability to fully assess your condition. If your provider identifies any concerns that need to be evaluated in person or the need to arrange testing (such as labs, EKG, etc.), we will make arrangements to do so. Although advances in technology are sophisticated, we cannot ensure that it will always work on either your end or our end. If the connection with a video visit is poor, the visit may have to be switched to a telephone visit. With either a video or telephone visit, we are not always able to ensure that we have a secure connection.  By engaging in this virtual visit, you consent to the provision of healthcare and authorize for your insurance to be billed (if applicable) for the services provided during this visit. Depending on your insurance coverage, you may receive a charge related to this service.  I need to obtain your verbal consent now. Are you willing to proceed with your visit today? [name] has provided verbal consent on 04/17/2023 for a virtual visit (video or telephone). Debbie Rodney, FNP  Date: 04/17/2023 6:32 PM  Virtual Visit via Video Note   I, [name], connected with  [name]  ([id number], 1976/08/22) on 04/17/23 at  6:30 PM EDT by a video-enabled telemedicine application and verified that I am speaking with the correct person using two identifiers.  Location: Patient: Virtual Visit Location Patient:  Home Provider: Virtual Visit Location Provider: Home Office   I discussed the limitations of evaluation and management by telemedicine and the availability of in person appointments. The patient expressed understanding and agreed to proceed.    History of Present Illness: [name] is a 47 y.o. who identifies as a female who was assigned female at birth, and is being seen today for nausea, and diarrhea that started today.  HPI: Diarrhea  This is a new problem. The current episode started today. The problem occurs 2 to 4 times per day. The problem has been unchanged. The stool consistency is described as Watery. Associated symptoms include bloating and coughing. Pertinent negatives include no chills, fever or vomiting. Associated symptoms comments: Nausea . She has tried change of diet for the symptoms. The treatment provided mild relief.    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   Severe alcohol use disorder (HCC) 07/01/2020   Major depressive disorder, recurrent severe without psychotic features (HCC) 07/01/2020   Pica 05/21/2020   Prediabetes 05/21/2020   Alcohol use disorder, severe, dependence (HCC) 05/21/2020   Attention-deficit hyperactivity disorder, unspecified type 04/01/2020   Bipolar disorder, unspecified (HCC) 04/01/2020   Diarrhea 03/24/2020   Hypokalemia 03/24/2020   Iron deficiency anemia 03/24/2020   MDD (major depressive disorder), recurrent, severe, 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  HCC)    Severe recurrent major depression without psychotic features (HCC) 03/22/2020   Adjustment disorder with mixed anxiety and depressed mood 01/10/2020   Marihuana abuse 06/16/2019   Nicotine dependence, unspecified, uncomplicated 06/16/2019    Gastroesophageal reflux disease 03/08/2017   Anxiety 03/08/2017   Opioid dependence with withdrawal (HCC) 09/24/2016   Cocaine abuse (HCC) 09/24/2016   Serotonin syndrome 09/17/2016   Altered mental status 09/17/2016   Bicornuate uterus 09/17/2016   Benign essential HTN 09/17/2016   Anxiety and depression 10/10/2015   History of intravenous drug use in remission 09/07/2011   Essential hypertension    Bipolar disorder (HCC)    Bicornuate uterus     Allergies:  Allergies  Allergen Reactions   Hydrocodone-Acetaminophen Nausea Only and Other (See Comments)   Medications:  Current Outpatient Medications:    ondansetron (ZOFRAN) 4 MG tablet, Take 1 tablet (4 mg total) by mouth every 8 (eight) hours as needed for nausea or vomiting., Disp: 20 tablet, Rfl: 0   amLODipine (NORVASC) 10 MG tablet, Take 1 tablet (10 mg total) by mouth daily., Disp: 90 tablet, Rfl: 1   hydrochlorothiazide (HYDRODIURIL) 25 MG tablet, Take 1 tablet (25 mg total) by mouth daily., Disp: 90 tablet, Rfl: 1   lisinopril (ZESTRIL) 20 MG tablet, Take 1 tablet (20 mg total) by mouth daily., Disp: 90 tablet, Rfl: 1   omeprazole (PRILOSEC) 40 MG capsule, Take 1 capsule (40 mg total) by mouth daily., Disp: 90 capsule, Rfl: 2  Observations/Objective: Patient is well-developed, well-nourished in no acute distress.  Resting comfortably  at home.  Head is normocephalic, atraumatic.  No labored breathing.  Speech is clear and coherent with logical content.  Patient is alert and oriented at baseline.  Laying in bed  Assessment and Plan: 1. Viral gastroenteritis - ondansetron (ZOFRAN) 4 MG tablet; Take 1 tablet (4 mg total) by mouth every 8 (eight) hours as needed for nausea or vomiting.  Dispense: 20 tablet; Refill: 0  Zofran as needed BRAT diet  Force fluids Tylenol  as needed Follow up if symptoms worsen or do not improve  Follow Up Instructions: I discussed the assessment and treatment plan with the patient. The  patient was provided an opportunity to ask questions and all were answered. The patient agreed with the plan and demonstrated an understanding of the instructions.  A copy of instructions were sent to the patient via MyChart unless otherwise noted below.    The patient was advised to call back or seek an in-person evaluation if the symptoms worsen or if the condition fails to improve as anticipated.  Time:  I spent 11 minutes with the patient via telehealth technology discussing the above problems/concerns.    Debbie Rodney, FNP

## 2023-04-20 ENCOUNTER — Other Ambulatory Visit: Payer: Self-pay

## 2023-04-27 ENCOUNTER — Other Ambulatory Visit: Payer: Self-pay

## 2023-04-30 ENCOUNTER — Other Ambulatory Visit: Payer: Self-pay

## 2023-05-10 ENCOUNTER — Telehealth: Payer: Managed Care, Other (non HMO) | Admitting: Physician Assistant

## 2023-05-10 ENCOUNTER — Other Ambulatory Visit: Payer: Self-pay

## 2023-05-10 DIAGNOSIS — M79642 Pain in left hand: Secondary | ICD-10-CM

## 2023-05-10 DIAGNOSIS — M79641 Pain in right hand: Secondary | ICD-10-CM

## 2023-05-10 MED ORDER — IBUPROFEN 800 MG PO TABS
800.0000 mg | ORAL_TABLET | Freq: Three times a day (TID) | ORAL | 0 refills | Status: AC | PRN
Start: 2023-05-10 — End: ?
  Filled 2023-05-10 – 2023-06-01 (×2): qty 30, 10d supply, fill #0

## 2023-05-10 MED ORDER — DICLOFENAC SODIUM 1 % EX GEL
2.0000 g | Freq: Four times a day (QID) | CUTANEOUS | 1 refills | Status: AC
Start: 2023-05-10 — End: ?
  Filled 2023-05-10: qty 200, 25d supply, fill #0
  Filled 2023-06-01: qty 400, 50d supply, fill #0
  Filled 2023-06-16: qty 200, 25d supply, fill #0
  Filled 2023-10-19: qty 100, 13d supply, fill #0

## 2023-05-10 NOTE — Patient Instructions (Signed)
[name], thank you for joining [name], PA-C for today's virtual visit.  While this provider is not your primary care provider (PCP), if your PCP is located in our provider database this encounter information will be shared with them immediately following your visit.   A [facility] MyChart account gives you access to today's visit and all your visits, tests, and labs performed at [facility] " click here if you don't have a [facility] MyChart account or go to [email or web address]  Consent: (Patient) [name] provided verbal consent for this virtual visit at the beginning of the encounter.  Current Medications:  Current Outpatient Medications:    diclofenac Sodium (VOLTAREN ARTHRITIS PAIN) 1 % GEL, Apply 2 g topically 4 (four) times daily., Disp: 350 g, Rfl: 1   ibuprofen (ADVIL) 800 MG tablet, Take 1 tablet (800 mg total) by mouth every 8 (eight) hours as needed., Disp: 30 tablet, Rfl: 0   amLODipine (NORVASC) 10 MG tablet, Take 1 tablet (10 mg total) by mouth daily., Disp: 90 tablet, Rfl: 1   doxycycline (VIBRA-TABS) 100 MG tablet, Take 1 tablet (100 mg total) by mouth 2 (two) times daily for 10 days., Disp: 20 tablet, Rfl: 0   hydrochlorothiazide (HYDRODIURIL) 25 MG tablet, Take 1 tablet (25 mg total) by mouth daily., Disp: 90 tablet, Rfl: 1   lisinopril (ZESTRIL) 20 MG tablet, Take 1 tablet (20 mg total) by mouth daily., Disp: 90 tablet, Rfl: 1   omeprazole (PRILOSEC) 40 MG capsule, Take 1 capsule (40 mg total) by mouth daily., Disp: 90 capsule, Rfl: 2   ondansetron (ZOFRAN) 4 MG tablet, Take 1 tablet (4 mg total) by mouth every 8 (eight) hours as needed for nausea or vomiting., Disp: 20 tablet, Rfl: 0   Medications ordered in this encounter:  Meds ordered this encounter  Medications   diclofenac Sodium (VOLTAREN ARTHRITIS PAIN) 1 % GEL    Sig: Apply 2 g topically 4 (four) times daily.    Dispense:  350 g    Refill:  1    Order Specific  Question:   Supervising Provider    Answer:   Merrilee Jansky [id number]   ibuprofen (ADVIL) 800 MG tablet    Sig: Take 1 tablet (800 mg total) by mouth every 8 (eight) hours as needed.    Dispense:  30 tablet    Refill:  0    Order Specific Question:   Supervising Provider    Answer:   Merrilee Jansky [id number]     *If you need refills on other medications prior to your next appointment, please contact your pharmacy*  Follow-Up: Call back or seek an in-person evaluation if the symptoms worsen or if the condition fails to improve as anticipated.  [facility] [phone]  Other Instructions   Orthopedic Urgent Care Location: [facility] at [facility] [address] [place], [place] [address] Phone: [phone] Convenient hours: Monday - Friday: 11 a.m. - 7 p.m. Visit our website to schedule an appointment online or walk-in during clinic hours. Your well-being is our priority. Move better with [place]!  [facility] [address], [place], [place] [address] URGENT CARE HOURS Monday - Friday: 8:00am to 8:00pm Saturday: 10:00am to 3:00pm [phone]   Hand Pain Hand pain can make it hard to do daily activities. Many things can cause hand pain. Some common causes are: Injuries. These may include: Broken bones (fractures)and cuts. Overuse injuries from doing the same movements  many times (repetitive activity). Arthritis. Lumps in the tendons or joints of the hand and wrist (ganglion cysts). Nerve compression syndromes (carpal tunnel syndrome). Inflammation of the tendons (tendinitis). Infection. Follow these instructions at home: Managing pain, stiffness, and swelling     Take over-the-counter and prescription medicines only as told by your health care provider. If told, put ice on the affected area. Put ice in a plastic bag. Place a towel between your skin and the bag. Leave the ice on for 20  minutes, 2-3 times a day. If told, apply heat to the affected area before you exercise or as often as told by your provider. Use the heat source that your provider recommends, such as a moist heat pack or a heating pad. Place a towel between your skin and the heat source. Leave the heat on for 20-30 minutes. If your skin turns bright red, remove the ice or heat right away to prevent skin damage. The risk of damage is higher if you cannot feel pain, heat, or cold. Activity Take breaks from repetitive activity often. Minimize stress on your hands and wrists as much as possible. Do stretches or exercises as told by your provider. Do not do activities that make your pain worse. Wear a hand splint or support as told by your provider. Contact a health care provider if: Your pain does not get better after a few days. Your pain gets worse. Your pain affects your ability to do your daily activities. Your hand becomes warm, red, or swollen. Your hand is numb or tingling. Get help right away if: Your hand is extremely swollen or is an unusual shape. Your hand or fingers turn white or blue. You cannot move your hand, wrist, or fingers. This information is not intended to replace advice given to you by your health care provider. Make sure you discuss any questions you have with your health care provider. Document Revised: 06/17/2022 Document Reviewed: 06/17/2022 Elsevier Patient Education  2024 Elsevier Inc.   Hand Exercises Hand exercises can be helpful for almost anyone. They can strengthen your hands and improve flexibility and movement. The exercises can also increase blood flow to the hands. These results can make your work and daily tasks easier for you. Hand exercises can be especially helpful for people who have joint pain from arthritis or nerve damage from using their hands over and over. These exercises can also help people who injure a hand. Exercises Most of these hand exercises are  gentle stretching and motion exercises. It is usually safe to do them often throughout the day. Warming up your hands before exercise may help reduce stiffness. You can do this with gentle massage or by placing your hands in warm water for 10-15 minutes. It is normal to feel some stretching, pulling, tightness, or mild discomfort when you begin new exercises. In time, this will improve. Remember to always be careful and stop right away if you feel sudden, very bad pain or your pain gets worse. You want to get better and be safe. Ask your health care provider which exercises are safe for you. Do exercises exactly as told by your provider and adjust them as told. Do not begin these exercises until told by your provider. Knuckle bend or "claw" fist  Stand or sit with your arm, hand, and all five fingers pointed straight up. Make sure to keep your wrist straight. Gently bend your fingers down toward your palm until the tips of your fingers are touching your palm.  Keep your big knuckle straight and only bend the small knuckles in your fingers. Hold this position for 10 seconds. Straighten your fingers back to your starting position. Repeat this exercise 5-10 times with each hand. Full finger fist  Stand or sit with your arm, hand, and all five fingers pointed straight up. Make sure to keep your wrist straight. Gently bend your fingers into your palm until the tips of your fingers are touching the middle of your palm. Hold this position for 10 seconds. Extend your fingers back to your starting position, stretching every joint fully. Repeat this exercise 5-10 times with each hand. Straight fist  Stand or sit with your arm, hand, and all five fingers pointed straight up. Make sure to keep your wrist straight. Gently bend your fingers at the big knuckle, where your fingers meet your hand, and at the middle knuckle. Keep the knuckle at the tips of your fingers straight and try to touch the bottom of your  palm. Hold this position for 10 seconds. Extend your fingers back to your starting position, stretching every joint fully. Repeat this exercise 5-10 times with each hand. Tabletop  Stand or sit with your arm, hand, and all five fingers pointed straight up. Make sure to keep your wrist straight. Gently bend your fingers at the big knuckle, where your fingers meet your hand, as far down as you can. Keep the small knuckles in your fingers straight. Think of forming a tabletop with your fingers. Hold this position for 10 seconds. Extend your fingers back to your starting position, stretching every joint fully. Repeat this exercise 5-10 times with each hand. Finger spread  Place your hand flat on a table with your palm facing down. Make sure your wrist stays straight. Spread your fingers and thumb apart from each other as far as you can until you feel a gentle stretch. Hold this position for 10 seconds. Bring your fingers and thumb tight together again. Hold this position for 10 seconds. Repeat this exercise 5-10 times with each hand. Making circles  Stand or sit with your arm, hand, and all five fingers pointed straight up. Make sure to keep your wrist straight. Make a circle by touching the tip of your thumb to the tip of your index finger. Hold for 10 seconds. Then open your hand wide. Repeat this motion with your thumb and each of your fingers. Repeat this exercise 5-10 times with each hand. Thumb motion  Sit with your forearm resting on a table and your wrist straight. Your thumb should be facing up toward the ceiling. Keep your fingers relaxed as you move your thumb. Lift your thumb up as high as you can toward the ceiling. Hold for 10 seconds. Bend your thumb across your palm as far as you can, reaching the tip of your thumb for the small finger (pinkie) side of your palm. Hold for 10 seconds. Repeat this exercise 5-10 times with each hand. Grip strengthening  Hold a stress ball or  other soft ball in the middle of your hand. Slowly increase the pressure, squeezing the ball as much as you can without causing pain. Think of bringing the tips of your fingers into the middle of your palm. All of your finger joints should bend when doing this exercise. Hold your squeeze for 10 seconds, then relax. Repeat this exercise 5-10 times with each hand. Contact a health care provider if: Your hand pain or discomfort gets much worse when you do an exercise. Your  hand pain or discomfort does not improve within 2 hours after you exercise. If you have either of these problems, stop doing these exercises right away. Do not do them again unless your provider says that you can. Get help right away if: You develop sudden, severe hand pain or swelling. If this happens, stop doing these exercises right away. Do not do them again unless your provider says that you can. This information is not intended to replace advice given to you by your health care provider. Make sure you discuss any questions you have with your health care provider. Document Revised: 11/24/2022 Document Reviewed: 11/24/2022 Elsevier Patient Education  2024 Elsevier Inc.    If you have been instructed to have an in-person evaluation today at a local Urgent Care facility, please use the link below. It will take you to a list of all of our available [facility], including address, phone number and hours of operation. Please do not delay care.  [facility]  If you or a family member do not have a primary care provider, use the link below to schedule a visit and establish care. When you choose a [facility] primary care physician or advanced practice provider, you gain a long-term partner in health. Find a Primary Care Provider  Learn more about [facility]'s in-office and virtual care options: [facility] - Get Care Now

## 2023-05-10 NOTE — Progress Notes (Signed)
Virtual Visit Consent   [name], you are scheduled for a virtual visit with a [facility] provider today. Just as with appointments in the office, your consent must be obtained to participate. Your consent will be active for this visit and any virtual visit you may have with one of our providers in the next 365 days. If you have a MyChart account, a copy of this consent can be sent to you electronically.  As this is a virtual visit, video technology does not allow for your provider to perform a traditional examination. This may limit your provider's ability to fully assess your condition. If your provider identifies any concerns that need to be evaluated in person or the need to arrange testing (such as labs, EKG, etc.), we will make arrangements to do so. Although advances in technology are sophisticated, we cannot ensure that it will always work on either your end or our end. If the connection with a video visit is poor, the visit may have to be switched to a telephone visit. With either a video or telephone visit, we are not always able to ensure that we have a secure connection.  By engaging in this virtual visit, you consent to the provision of healthcare and authorize for your insurance to be billed (if applicable) for the services provided during this visit. Depending on your insurance coverage, you may receive a charge related to this service.  I need to obtain your verbal consent now. Are you willing to proceed with your visit today? [name] has provided verbal consent on 05/10/2023 for a virtual visit (video or telephone). Debbie Loveless, PA-C  Date: 05/10/2023 11:52 AM  Virtual Visit via Video Note   I, [name], connected with  [name]  ([id number], 06/27/1976) on 05/10/23 at 11:45 AM EDT by a video-enabled telemedicine application and verified that I am speaking with the correct person using two identifiers.  Location: Patient: Virtual Visit  Location Patient: Other: work; isolated Provider: Engineer, mining Provider: Home Office   I discussed the limitations of evaluation and management by telemedicine and the availability of in person appointments. The patient expressed understanding and agreed to proceed.    History of Present Illness: [name] is a 47 y.o. who identifies as a female who was assigned female at birth, and is being seen today for hand pain, bilaterally.  HPI: Hand Pain  The incident occurred more than 1 week ago. The injury mechanism was repetitive motion. The pain is present in the left hand and right hand. The quality of the pain is described as aching and shooting (numbness). The pain is severe. The pain has been Constant since the incident. Associated symptoms include muscle weakness, numbness and tingling. The symptoms are aggravated by movement and lifting. She has tried acetaminophen for the symptoms. The treatment provided no relief.   Yesterday had right hand numbness from fingers, wrist, and forearm that lasted for a couple of hours after awakening.    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   Severe alcohol use disorder (HCC) 07/01/2020   Major depressive disorder, recurrent severe without psychotic features (HCC) 07/01/2020   Pica 05/21/2020   Prediabetes 05/21/2020   Alcohol use disorder, severe, dependence (HCC) 05/21/2020   Attention-deficit hyperactivity disorder, unspecified type 04/01/2020  Bipolar disorder, unspecified (HCC) 04/01/2020   Diarrhea 03/24/2020   Hypokalemia 03/24/2020   Iron deficiency anemia 03/24/2020   MDD (major depressive disorder), recurrent, severe, 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HCC)    Severe recurrent major depression  without psychotic features (HCC) 03/22/2020   Adjustment disorder with mixed anxiety and depressed mood 01/10/2020   Marihuana abuse 06/16/2019   Nicotine dependence, unspecified, uncomplicated 06/16/2019   Gastroesophageal reflux disease 03/08/2017   Anxiety 03/08/2017   Opioid dependence with withdrawal (HCC) 09/24/2016   Cocaine abuse (HCC) 09/24/2016   Serotonin syndrome 09/17/2016   Altered mental status 09/17/2016   Bicornuate uterus 09/17/2016   Benign essential HTN 09/17/2016   Anxiety and depression 10/10/2015   History of intravenous drug use in remission 09/07/2011   Essential hypertension    Bipolar disorder (HCC)    Bicornuate uterus     Allergies:  Allergies  Allergen Reactions   Hydrocodone-Acetaminophen Nausea Only and Other (See Comments)   Medications:  Current Outpatient Medications:    diclofenac Sodium (VOLTAREN ARTHRITIS PAIN) 1 % GEL, Apply 2 g topically 4 (four) times daily., Disp: 350 g, Rfl: 1   ibuprofen (ADVIL) 800 MG tablet, Take 1 tablet (800 mg total) by mouth every 8 (eight) hours as needed., Disp: 30 tablet, Rfl: 0   amLODipine (NORVASC) 10 MG tablet, Take 1 tablet (10 mg total) by mouth daily., Disp: 90 tablet, Rfl: 1   doxycycline (VIBRA-TABS) 100 MG tablet, Take 1 tablet (100 mg total) by mouth 2 (two) times daily for 10 days., Disp: 20 tablet, Rfl: 0   hydrochlorothiazide (HYDRODIURIL) 25 MG tablet, Take 1 tablet (25 mg total) by mouth daily., Disp: 90 tablet, Rfl: 1   lisinopril (ZESTRIL) 20 MG tablet, Take 1 tablet (20 mg total) by mouth daily., Disp: 90 tablet, Rfl: 1   omeprazole (PRILOSEC) 40 MG capsule, Take 1 capsule (40 mg total) by mouth daily., Disp: 90 capsule, Rfl: 2   ondansetron (ZOFRAN) 4 MG tablet, Take 1 tablet (4 mg total) by mouth every 8 (eight) hours as needed for nausea or vomiting., Disp: 20 tablet, Rfl: 0  Observations/Objective: Patient is well-developed, well-nourished in no acute distress.  Resting comfortably Head  is normocephalic, atraumatic.  No labored breathing.  Speech is clear and coherent with logical content.  Patient is alert and oriented at baseline.    Assessment and Plan: 1. Pain in both hands - diclofenac Sodium (VOLTAREN ARTHRITIS PAIN) 1 % GEL; Apply 2 g topically 4 (four) times daily.  Dispense: 350 g; Refill: 1 - ibuprofen (ADVIL) 800 MG tablet; Take 1 tablet (800 mg total) by mouth every 8 (eight) hours as needed.  Dispense: 30 tablet; Refill: 0  - Suspect possible carpal tunnel with/without tendinitis - Add Ibuprofen 800mg  and voltaren gel - Discussed steroid but patient declines at this time due to anxiety and bipolar (manic) - Tylenol if okay for breakthrough pain - Heat to area - Cock-up wrist splints while sleeping - Seek in person evaluation if worsening or fails to improve with treatment   Follow Up Instructions: I discussed the assessment and treatment plan with the patient. The patient was provided an opportunity to ask questions and all were answered. The patient agreed with the plan and demonstrated an understanding of the instructions.  A copy of instructions were sent to the patient via MyChart unless otherwise noted below.    The patient was advised to call back or seek an in-person evaluation if the symptoms  worsen or if the condition fails to improve as anticipated.  Time:  I spent 10 minutes with the patient via telehealth technology discussing the above problems/concerns.    Mar Daring, PA-C

## 2023-05-17 ENCOUNTER — Other Ambulatory Visit: Payer: Self-pay

## 2023-06-01 ENCOUNTER — Other Ambulatory Visit: Payer: Self-pay

## 2023-06-10 ENCOUNTER — Encounter: Payer: Self-pay | Admitting: Gastroenterology

## 2023-06-16 ENCOUNTER — Other Ambulatory Visit: Payer: Self-pay

## 2023-06-17 ENCOUNTER — Ambulatory Visit: Payer: Self-pay | Admitting: *Deleted

## 2023-06-17 NOTE — Telephone Encounter (Signed)
Called patient to make a follow up, VV with PCP to address vaginal issues and / or Mobile Unit options today. Left message on voicemail to return call.

## 2023-06-17 NOTE — Telephone Encounter (Signed)
Chief Complaint: depression , vaginal sx  Symptoms: depression sx worsening, not sleeping not eating or drinking well. Reports manic episode couple of weeks ago . Reports boyfriend has been cheating on her and now "my vagina does not smell the same". Having panic attacks everyday. Did report she wants to run her car in to something. Has missed work due to [employer]. Did work last night and reports she is going to try to sleep. Crying , ADHD "going wild" . Reports being off of medication x 1 year and it worked for only a week and stopped. Patient home alone at this time. Did not report any other ways of harming self now .  Frequency: 1 month  Pertinent Negatives: Patient denies chest pain no difficulty breathing no plan to harm self inside of home.  Disposition: [] ED /[] Urgent Care (no appt availability in office) / [] Appointment(In office/virtual)/ []  [facility]/ [] Home Care/ [] Refused Recommended Disposition /[] [facility]/ [x]  Follow-up with PCP Additional Notes:   Provided emotional support for patient. Gave national crisis # to text "[id number]" gave address and # to Urgent crisis center. Patient requesting PCP to refer to a "Dr" . For mental health needs. Recommended appt or go to ED  for evaluation and do not feel patient will go . Patient needs assessed for vaginal sx. Please advise  SW/ CM for additional assistance.      Reason for Disposition  [1] Depression AND [2] worsening (e.g., sleeping poorly, less able to do activities of daily living)  Answer Assessment - Initial Assessment Questions 1. CONCERN: "What happened that made you call today?"    Boyfriend cheating on her and sx of worsening depression reported  2. DEPRESSION SYMPTOM SCREENING: "How are you feeling overall?" (e.g., decreased energy, increased sleeping or difficulty sleeping, difficulty concentrating, feelings of sadness, guilt, hopelessness, or worthlessness)     Difficulty sleeping , crying , ADHD "gone wild"  , unable to work due to depression , reports dry skin around nose and vagina "does not smell the same" 3. RISK OF HARM - SUICIDAL IDEATION:  "Do you ever have thoughts of hurting or killing yourself?"  (e.g., yes, no, no but preoccupation with thoughts about death)   - INTENT:  "Do you have thoughts of hurting or killing yourself right NOW?" (e.g., yes, no, N/A)   - PLAN: "Do you have a specific plan for how you would do this?" (e.g., gun, knife, overdose, no plan, N/A)     Yes , reports she wants to drive her car in to something everyday  4. RISK OF HARM - HOMICIDAL IDEATION:  "Do you ever have thoughts of hurting or killing someone else?"  (e.g., yes, no, no but preoccupation with thoughts about death)   - INTENT:  "Do you have thoughts of hurting or killing someone right NOW?" (e.g., yes, no, N/A)   - PLAN: "Do you have a specific plan for how you would do this?" (e.g., gun, knife, no plan, N/A)      Did not report 5. FUNCTIONAL IMPAIRMENT: "How have things been going for you overall? Have you had more difficulty than usual doing your normal daily activities?"  (e.g., better, same, worse; self-care, school, work, interactions)     Overall not good  6. SUPPORT: "Who is with you now?" "Who do you live with?" "Do you have family or friends who you can talk to?"      Alone now just came in from work night shift. Does not  have anyone to talk to  7. THERAPIST: "Do you have a counselor or therapist? Name?"     na 8. STRESSORS: "Has there been any new stress or recent changes in your life?"     Boyfriend cheating on her and she is not taking any medication for sx due to "they work for a week and then stop working" 9. ALCOHOL USE OR SUBSTANCE USE (DRUG USE): "Do you drink alcohol or use any illegal drugs?"     Smoke marijuana / cigarettes  10. OTHER: "Do you have any other physical symptoms right now?" (e.g., fever)       Vaginal odor, dry skin around nose and not eating or drinking well 11.  PREGNANCY: "Is there any chance you are pregnant?" "When was your last menstrual period?"       na  Protocols used: Depression-A-AH

## 2023-06-17 NOTE — Telephone Encounter (Signed)
Patient returned call but disconnected before she could be transferred to nurse triage

## 2023-06-18 NOTE — Telephone Encounter (Addendum)
Spoke with patient . Verified name & DOB   Patient voiced that she was still feeling about the same as yesterday. Advised patient to got [facility]  because we do not have any VV or OV appointment  for today. Patient voiced that she has an appointment with PCP on 08/14 and would just try to hold on until then. Advised mobile unit on Monday or VV, patient declined and wants to see PCP on 08/14/202. Advised patient the if thing get worse for her call [phone] or go the UC [facility].

## 2023-06-18 NOTE — Telephone Encounter (Signed)
Call placed to patient unable to reach message left on VM.   

## 2023-06-23 ENCOUNTER — Other Ambulatory Visit: Payer: Self-pay

## 2023-06-26 ENCOUNTER — Telehealth: Payer: MEDICAID | Admitting: Family Medicine

## 2023-06-26 DIAGNOSIS — F32A Depression, unspecified: Secondary | ICD-10-CM

## 2023-06-26 DIAGNOSIS — F419 Anxiety disorder, unspecified: Secondary | ICD-10-CM

## 2023-06-26 NOTE — Progress Notes (Signed)
Virtual Visit Consent   [name], you are scheduled for a virtual visit with a [facility] provider today. Just as with appointments in the office, your consent must be obtained to participate. Your consent will be active for this visit and any virtual visit you may have with one of our providers in the next 365 days. If you have a MyChart account, a copy of this consent can be sent to you electronically.  As this is a virtual visit, video technology does not allow for your provider to perform a traditional examination. This may limit your provider's ability to fully assess your condition. If your provider identifies any concerns that need to be evaluated in person or the need to arrange testing (such as labs, EKG, etc.), we will make arrangements to do so. Although advances in technology are sophisticated, we cannot ensure that it will always work on either your end or our end. If the connection with a video visit is poor, the visit may have to be switched to a telephone visit. With either a video or telephone visit, we are not always able to ensure that we have a secure connection.  By engaging in this virtual visit, you consent to the provision of healthcare and authorize for your insurance to be billed (if applicable) for the services provided during this visit. Depending on your insurance coverage, you may receive a charge related to this service.  I need to obtain your verbal consent now. Are you willing to proceed with your visit today? [name] has provided verbal consent on 06/26/2023 for a virtual visit (video or telephone). Georgana Curio, FNP  Date: 06/26/2023 9:00 AM  Virtual Visit via Video Note   I, Georgana Curio, connected with  [name]  ([id number], 11-06-1976) on 06/26/23 at  9:00 AM EDT by a video-enabled telemedicine application and verified that I am speaking with the correct person using two identifiers.  Location: Patient: Virtual Visit Location Patient:  Home Provider: Virtual Visit Location Provider: Home Office   I discussed the limitations of evaluation and management by telemedicine and the availability of in person appointments. The patient expressed understanding and agreed to proceed.    History of Present Illness: [name] is a 46 y.o. who identifies as a female who was assigned female at birth, and is being seen today for depression and anxiety. She says she has been off meds for a year bc they were not helping. She says she had to leave work last night and needs a noted. She has apptmt with pcp 8/14 for this. She denies suicidal and homicidal thoughts. She declines hydroxyzine and says that's a waste of her time and mine. 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   Severe alcohol use disorder (HCC) 07/01/2020   Major depressive disorder, recurrent severe without psychotic features (HCC) 07/01/2020   Pica 05/21/2020   Prediabetes 05/21/2020   Alcohol use disorder, severe, dependence (HCC) 05/21/2020   Attention-deficit hyperactivity disorder, unspecified type 04/01/2020   Bipolar disorder, unspecified (HCC) 04/01/2020   Diarrhea 03/24/2020   Hypokalemia 03/24/2020   Iron deficiency anemia 03/24/2020   MDD (major depressive disorder), recurrent, severe, 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HCC)    Severe recurrent major depression without psychotic features (HCC) 03/22/2020  Adjustment disorder with mixed anxiety and depressed mood 01/10/2020   Marihuana abuse 06/16/2019   Nicotine dependence, unspecified, uncomplicated 06/16/2019   Gastroesophageal reflux disease 03/08/2017   Anxiety 03/08/2017   Opioid dependence with withdrawal (HCC) 09/24/2016   Cocaine abuse (HCC) 09/24/2016    Serotonin syndrome 09/17/2016   Altered mental status 09/17/2016   Bicornuate uterus 09/17/2016   Benign essential HTN 09/17/2016   Anxiety and depression 10/10/2015   History of intravenous drug use in remission 09/07/2011   Essential hypertension    Bipolar disorder (HCC)    Bicornuate uterus     Allergies:  Allergies  Allergen Reactions   Hydrocodone-Acetaminophen Nausea Only and Other (See Comments)   Medications:  Current Outpatient Medications:    amLODipine (NORVASC) 10 MG tablet, Take 1 tablet (10 mg total) by mouth daily., Disp: 90 tablet, Rfl: 1   diclofenac Sodium (VOLTAREN ARTHRITIS PAIN) 1 % GEL, Apply 2 g topically 4 (four) times daily., Disp: 350 g, Rfl: 1   hydrochlorothiazide (HYDRODIURIL) 25 MG tablet, Take 1 tablet (25 mg total) by mouth daily., Disp: 90 tablet, Rfl: 1   ibuprofen (ADVIL) 800 MG tablet, Take 1 tablet (800 mg total) by mouth every 8 (eight) hours as needed., Disp: 30 tablet, Rfl: 0   lisinopril (ZESTRIL) 20 MG tablet, Take 1 tablet (20 mg total) by mouth daily., Disp: 90 tablet, Rfl: 1   omeprazole (PRILOSEC) 40 MG capsule, Take 1 capsule (40 mg total) by mouth daily., Disp: 90 capsule, Rfl: 2   ondansetron (ZOFRAN) 4 MG tablet, Take 1 tablet (4 mg total) by mouth every 8 (eight) hours as needed for nausea or vomiting., Disp: 20 tablet, Rfl: 0  Observations/Objective: Patient is well-developed, well-nourished in no acute distress.  Resting comfortably  at home.  Head is normocephalic, atraumatic.  No labored breathing.  Speech is clear and coherent with logical content.  Patient is alert and oriented at baseline.    Assessment and Plan: 1. Anxiety and depression  Follow up at urgent care if needed. Keep apptmt with PCP on 8/14. Discussed we are not able to prescribe any controlled meds via telehealth.   Follow Up Instructions: I discussed the assessment and treatment plan with the patient. The patient was provided an opportunity to ask  questions and all were answered. The patient agreed with the plan and demonstrated an understanding of the instructions.  A copy of instructions were sent to the patient via MyChart unless otherwise noted below.     The patient was advised to call back or seek an in-person evaluation if the symptoms worsen or if the condition fails to improve as anticipated.  Time:  I spent 10 minutes with the patient via telehealth technology discussing the above problems/concerns.    Georgana Curio, FNP

## 2023-06-26 NOTE — Patient Instructions (Signed)

## 2023-07-04 ENCOUNTER — Ambulatory Visit (HOSPITAL_COMMUNITY)
Admission: EM | Admit: 2023-07-04 | Discharge: 2023-07-04 | Disposition: A | Discharge status: Home / Self Care | Payer: MEDICAID | Attending: Addiction Medicine | Admitting: Addiction Medicine

## 2023-07-04 DIAGNOSIS — F32A Depression, unspecified: Secondary | ICD-10-CM | POA: Insufficient documentation

## 2023-07-04 NOTE — ED Provider Notes (Signed)
Behavioral Health Urgent Care Medical Screening Exam  Patient Name: [name] MRN: [id number] Date of Evaluation: 07/04/23 Chief Complaint:  Patient wants to start medications Diagnosis:  Final diagnoses:  None    History of Present illness: [name] is a 47 y.o. female brought in by self because she is feeling manic and wants to start medications.  She has an appointment scheduled on the 14th to establish care with a mental health provider, but thought she would come in today to see if she could start something sooner.    [name], 47 y.o., female patient seen face to face by this provider, consulted with Dr. Clovis Riley; and chart reviewed on 07/04/23.  On evaluation [name] reports manic symptoms of reduced need for sleep (only 2 hours per day) and feeling irritable.  During evaluation [name] is sitting in a chair in no acute distress. She is alert, oriented x 4, calm, cooperative and attentive. Her mood is depressed with congruent affect.  She has normal speech, and behavior.  Objectively there is no evidence of psychosis/mania or delusional thinking.  Patient is able to converse coherently, goal directed thoughts, no distractibility, or pre-occupation.  She also denies suicidal/self-harm/homicidal ideation, psychosis, and paranoia.  Patient answered question appropriately.    At this time [name] is educated and verbalizes understanding of mental health resources and other crisis services in the community. She is instructed to call 911 and present to the nearest emergency room should she experience any suicidal/homicidal ideation, auditory/visual/hallucinations, or detrimental worsening of her mental health condition.  Patient does not meet criteria for inpatient psychiatric hospitalization. She is cleared for discharge home.     Flowsheet Row ED from 07/04/2023 in [facility] ED from 11/04/2022 in [facility]  Urgent Care at [facility] ED from 08/08/2022 in [facility] Urgent Care at [facility] RISK CATEGORY No Risk No Risk No Risk       Psychiatric Specialty Exam  Presentation  General Appearance:Disheveled  Eye Contact:Good  Speech:Clear and Coherent  Speech Volume:Normal  Handedness:Right   Mood and Affect  Mood: Depressed  Affect: Congruent   Thought Process  Thought Processes: Coherent  Descriptions of Associations:Intact  Orientation:Full (Time, Place and Person)  Thought Content:WDL    Hallucinations:None  Ideas of Reference:None  Suicidal Thoughts:No  Homicidal Thoughts:No   Sensorium  Memory: Immediate Good; Recent Good; Remote Good  Judgment: Fair  Insight: Poor   Executive Functions  Concentration: Fair  Attention Span: Fair  Recall: Good  Fund of Knowledge: Fair  Language: Fair   Psychomotor Activity  Psychomotor Activity: Normal   Assets  Assets: Communication Skills; Housing   Sleep  Sleep: Poor  Number of hours:  2   Physical Exam: Physical Exam Eyes:     Pupils: Pupils are equal, round, and reactive to light.  Pulmonary:     Effort: Pulmonary effort is normal.  Skin:    General: Skin is dry.  Neurological:     Mental Status: She is alert and oriented to person, place, and time.    Review of Systems  Psychiatric/Behavioral:  Positive for depression. The patient is nervous/anxious.   All other systems reviewed and are negative.  Blood pressure 94/67, pulse (!) 104, temperature 98.6 F (37 C), temperature source Oral, resp. rate 17, SpO2 99%. There is no height or weight on file to calculate BMI.  Musculoskeletal: Strength & Muscle Tone: within normal limits Gait & Station: normal Patient leans: N/A  [facility] MSE Discharge Disposition for Follow up and Recommendations: Based on my evaluation the patient does not appear to have an emergency medical condition and can be discharged with  resources and follow up care in outpatient services for Follow up with already scheduled appointment    Thomes Lolling, NP 07/04/2023, 2:30 PM

## 2023-07-04 NOTE — Progress Notes (Signed)
   07/04/23 1341  [facility] Triage Screening (Walk-ins at [facility] only)  How Did You Hear About [place]? Self  What Is the Reason for Your Visit/Call Today? Pt arrived to [facility] unaccompanied by anyone. Pt states that she has been having manic episodes for the past month. Pt states that she has had tele-visits this month and she has appt on the 14th with Dr. Laural Benes. Pt states she took herself off her psych medication last year. Pt. states that her relationship has been the cause of her beginning to have the manic episode. Pt states that she was working 12 hr shifts, but wasn't really sleeping. Pt. states that she has been losing weight and a decline in appetite. Pt. states that she was going to go see her PCP to get restarted on some psych medication. Pt states that she has been eating since Thursday, but today is the first day that she has felt able to get out the house. Pt states that she had 2 bowl hits of marijuana this morning. Pt denies SI/HI and AVH at this current time.  How Long Has This Been Causing You Problems? 1 wk - 1 month  Have You Recently Had Any Thoughts About Hurting Yourself? No  Are You Planning to Commit Suicide/Harm Yourself At This time? No  Have you Recently Had Thoughts About Hurting Someone Karolee Ohs? No  Are You Planning To Harm Someone At This Time? No  Are you currently experiencing any auditory, visual or other hallucinations? No  Have You Used Any Alcohol or Drugs in the Past 24 Hours? Yes  How long ago did you use Drugs or Alcohol? this morning  What Did You Use and How Much? Marijuana about 2 bowl hits  Do you have any current medical co-morbidities that require immediate attention? No  Clinician description of patient physical appearance/behavior: casually dressed, calm, cooperative  What Do You Feel Would Help You the Most Today? Medication(s)  If access to [facility] Urgent Care was not available, would you have sought care in the Emergency Department? No  Determination of Need Routine (7  days)  Options For Referral Medication Management

## 2023-07-04 NOTE — Discharge Instructions (Signed)
Follow up with already scheduled appointment.

## 2023-07-07 ENCOUNTER — Telehealth: Payer: Managed Care, Other (non HMO) | Admitting: Nurse Practitioner

## 2023-07-07 ENCOUNTER — Telehealth: Payer: Self-pay | Admitting: *Deleted

## 2023-07-07 NOTE — Telephone Encounter (Signed)
Attempt to reach pt for pre-visit.  Unable to leave message due to VM not being set up. Will attempt to reach again in  5 min due to no other # listed in profile 2nd attempt unsuccessful. VM not set up

## 2023-07-09 ENCOUNTER — Other Ambulatory Visit: Payer: Self-pay

## 2023-07-09 ENCOUNTER — Ambulatory Visit (HOSPITAL_COMMUNITY)
Admission: EM | Admit: 2023-07-09 | Discharge: 2023-07-10 | Disposition: A | Discharge status: Home / Self Care | Payer: MEDICAID | Attending: Psychiatry | Admitting: Psychiatry

## 2023-07-09 DIAGNOSIS — F1910 Other psychoactive substance abuse, uncomplicated: Secondary | ICD-10-CM | POA: Diagnosis present

## 2023-07-09 DIAGNOSIS — F1994 Other psychoactive substance use, unspecified with psychoactive substance-induced mood disorder: Secondary | ICD-10-CM

## 2023-07-09 DIAGNOSIS — R45851 Suicidal ideations: Secondary | ICD-10-CM | POA: Insufficient documentation

## 2023-07-09 DIAGNOSIS — F1010 Alcohol abuse, uncomplicated: Secondary | ICD-10-CM | POA: Insufficient documentation

## 2023-07-09 DIAGNOSIS — F1514 Other stimulant abuse with stimulant-induced mood disorder: Secondary | ICD-10-CM | POA: Insufficient documentation

## 2023-07-09 LAB — CBC WITH DIFFERENTIAL/PLATELET
Abs Immature Granulocytes: 0.07 10*3/uL (ref 0.00–0.07)
Basophils Absolute: 0.1 10*3/uL (ref 0.0–0.1)
Basophils Relative: 1 %
Eosinophils Absolute: 0.1 10*3/uL (ref 0.0–0.5)
Eosinophils Relative: 1 %
HCT: 42.3 % (ref 36.0–46.0)
Hemoglobin: 13 g/dL (ref 12.0–15.0)
Immature Granulocytes: 1 %
Lymphocytes Relative: 22 %
Lymphs Abs: 3.2 10*3/uL (ref 0.7–4.0)
MCH: 21.5 pg — ABNORMAL LOW (ref 26.0–34.0)
MCHC: 30.7 g/dL (ref 30.0–36.0)
MCV: 70 fL — ABNORMAL LOW (ref 80.0–100.0)
Monocytes Absolute: 1.6 10*3/uL — ABNORMAL HIGH (ref 0.1–1.0)
Monocytes Relative: 11 %
Neutro Abs: 9.5 10*3/uL — ABNORMAL HIGH (ref 1.7–7.7)
Neutrophils Relative %: 64 %
Platelets: 401 10*3/uL — ABNORMAL HIGH (ref 150–400)
RBC: 6.04 MIL/uL — ABNORMAL HIGH (ref 3.87–5.11)
RDW: 17.8 % — ABNORMAL HIGH (ref 11.5–15.5)
WBC: 14.5 10*3/uL — ABNORMAL HIGH (ref 4.0–10.5)
nRBC: 0 % (ref 0.0–0.2)

## 2023-07-09 LAB — POCT URINE DRUG SCREEN - MANUAL ENTRY (I-SCREEN)
POC Amphetamine UR: NOT DETECTED
POC Buprenorphine (BUP): NOT DETECTED
POC Cocaine UR: NOT DETECTED
POC Marijuana UR: POSITIVE — AB
POC Methadone UR: NOT DETECTED
POC Methamphetamine UR: POSITIVE — AB
POC Morphine: NOT DETECTED
POC Oxazepam (BZO): NOT DETECTED
POC Oxycodone UR: NOT DETECTED
POC Secobarbital (BAR): NOT DETECTED

## 2023-07-09 LAB — COMPREHENSIVE METABOLIC PANEL
ALT: 18 U/L (ref 0–44)
AST: 22 U/L (ref 15–41)
Albumin: 4.6 g/dL (ref 3.5–5.0)
Alkaline Phosphatase: 65 U/L (ref 38–126)
Anion gap: 14 (ref 5–15)
BUN: 7 mg/dL (ref 6–20)
CO2: 22 mmol/L (ref 22–32)
Calcium: 10.3 mg/dL (ref 8.9–10.3)
Chloride: 97 mmol/L — ABNORMAL LOW (ref 98–111)
Creatinine, Ser: 0.72 mg/dL (ref 0.44–1.00)
GFR, Estimated: >60 mL/min (ref >60)
Glucose, Bld: 105 mg/dL — ABNORMAL HIGH (ref 70–99)
Potassium: 3.1 mmol/L — ABNORMAL LOW (ref 3.5–5.1)
Sodium: 133 mmol/L — ABNORMAL LOW (ref 135–145)
Total Bilirubin: 0.7 mg/dL (ref 0.3–1.2)
Total Protein: 7.7 g/dL (ref 6.5–8.1)

## 2023-07-09 LAB — TSH: TSH: 2.408 u[IU]/mL (ref 0.350–4.500)

## 2023-07-09 LAB — POC URINE PREG, ED: Preg Test, Ur: NEGATIVE

## 2023-07-09 LAB — ETHANOL: Alcohol, Ethyl (B): <10 mg/dL (ref <10)

## 2023-07-09 MED ORDER — IBUPROFEN 600 MG PO TABS: 600.0000 mg | ORAL_TABLET | Freq: Once | ORAL | Status: DC | PRN

## 2023-07-09 MED ORDER — TRAZODONE HCL 100 MG PO TABS
100.0000 mg | ORAL_TABLET | Freq: Every day | ORAL | Status: DC
  Administered 2023-07-09: 100 mg via ORAL
  Filled 2023-07-09: qty 1

## 2023-07-09 MED ORDER — DIPHENHYDRAMINE HCL 50 MG PO CAPS
50.0000 mg | ORAL_CAPSULE | Freq: Four times a day (QID) | ORAL | Status: DC | PRN
  Administered 2023-07-09: 50 mg via ORAL
  Filled 2023-07-09: qty 1

## 2023-07-09 MED ORDER — MAGNESIUM HYDROXIDE 400 MG/5ML PO SUSP: 30.0000 mL | Freq: Every day | ORAL | Status: DC | PRN

## 2023-07-09 MED ORDER — ALUM & MAG HYDROXIDE-SIMETH 200-200-20 MG/5ML PO SUSP: 30.0000 mL | ORAL | Status: DC | PRN

## 2023-07-09 MED ORDER — HYDROXYZINE HCL 25 MG PO TABS
50.0000 mg | ORAL_TABLET | Freq: Three times a day (TID) | ORAL | Status: DC | PRN
  Administered 2023-07-09: 50 mg via ORAL
  Filled 2023-07-09: qty 2

## 2023-07-09 NOTE — ED Notes (Signed)
Pt sleeping at present, no distress noted.  Monitoring for safety. 
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
## 2023-07-09 NOTE — ED Notes (Signed)
Pt A&O x 4, presents with suicidal ideations, no plan noted.  Pt initially very irritable, but now pleasant and interactive with staff.  Resting at present.  Monitoring for safety.

## 2023-07-09 NOTE — ED Provider Notes (Signed)
Behavioral Health Urgent Care Medical Screening Exam  Patient Name: [name] MRN: [id number] Date of Evaluation: 07/09/23 Chief Complaint:  "I am sad all the time. I was thinking about walking into traffic. I relapsed last night." Diagnosis:  Final diagnoses:  Suicidal ideation    History of Present illness: [name] is a 47 y.o. female brought in by self suicidal ideation and manic symptoms. She had an appointment scheduled on the 14th to establish care with a mental health provider to treat her bipolar disorder, but missed the appointment and is now feeling suicidal.  In addition, she relapsed on alcohol and methamphetamine last night after 6 months of sobriety.     [name], 47 y.o., female patient seen face to face by this provider, consulted with Dr. Enedina Finner; and chart reviewed on 07/09/23.  On evaluation [name] reports manic symptoms of reduced need for sleep (only 2 hours per day), suicidal ideations and relapse on alcohol and methamphetamine.    During evaluation [name] is sitting in a chair in acute distress; she is tearful and depressed. She is alert, oriented x 4, cooperative and attentive. Her mood is depressed with congruent affect. She complains of emotional lability. She has normal speech, and behavior.  Objectively there is evidence of mania.  Patient is able to converse coherently, goal directed thoughts, she is distractible, and is pre-occupied with thoughts of sadness and aging.  She also denies homicidal ideation, psychosis, and paranoia.  Patient answered question appropriately.     At this time [name] is a danger to herself.  She requires inpatient psychiatric hospitalization for stabilization and medication management.  Recommend inpatient psychiatric hospitalization.     Flowsheet Row ED from 07/09/2023 in [facility] ED from 07/04/2023 in [facility] ED from  11/04/2022 in [facility] Urgent Care at [facility] RISK CATEGORY No Risk No Risk No Risk       Psychiatric Specialty Exam  Presentation  General Appearance:Appropriate for Environment  Eye Contact:Good  Speech:Clear and Coherent; Normal Rate  Speech Volume:Normal  Handedness:Right   Mood and Affect  Mood: Depressed; Labile  Affect: Congruent   Thought Process  Thought Processes: Coherent  Descriptions of Associations:Intact  Orientation:Full (Time, Place and Person)  Thought Content:WDL    Hallucinations:None  Ideas of Reference:None  Suicidal Thoughts:Yes, Active With Intent; With Plan  Homicidal Thoughts:No   Sensorium  Memory: Immediate Good; Recent Good; Remote Good  Judgment: Fair  Insight: Poor   Executive Functions  Concentration: Fair  Attention Span: Fair  Recall: Good  Fund of Knowledge: Fair  Language: Fair   Psychomotor Activity  Psychomotor Activity: Normal   Assets  Assets: Manufacturing systems engineer; Housing   Sleep  Sleep: Poor  Number of hours:  2   Physical Exam: Physical Exam Vitals and nursing note reviewed.  Eyes:     Pupils: Pupils are equal, round, and reactive to light.  Pulmonary:     Effort: Pulmonary effort is normal.  Skin:    General: Skin is dry.  Neurological:     Mental Status: She is alert and oriented to person, place, and time.    Review of Systems  Psychiatric/Behavioral:  Positive for depression, substance abuse and suicidal ideas.   All other systems reviewed and are negative.  Blood pressure (!) 152/104, pulse 96, temperature 98.6 F (37 C), temperature source Oral, resp. rate 18, SpO2 100%. There is no height or weight on file to calculate  BMI.  Musculoskeletal: Strength & Muscle Tone: within normal limits Gait & Station: normal Patient leans: N/A   [facility] MSE Discharge Disposition for Follow up and Recommendations: Recommend inpatient psychiatric  hospitalization.   Thomes Lolling, NP 07/09/2023, 6:22 PM

## 2023-07-10 ENCOUNTER — Emergency Department (HOSPITAL_COMMUNITY)
Admission: EM | Admit: 2023-07-10 | Discharge: 2023-07-10 | Disposition: A | Discharge status: Home / Self Care | Payer: MEDICAID | Attending: Emergency Medicine | Admitting: Emergency Medicine

## 2023-07-10 ENCOUNTER — Encounter (HOSPITAL_COMMUNITY): Payer: Self-pay

## 2023-07-10 ENCOUNTER — Other Ambulatory Visit: Payer: Self-pay

## 2023-07-10 ENCOUNTER — Inpatient Hospital Stay (HOSPITAL_COMMUNITY): Admission: EM | Admit: 2023-07-10 | Payer: MEDICAID | Source: Intra-hospital | Admitting: Unknown Physician Specialty

## 2023-07-10 ENCOUNTER — Encounter (HOSPITAL_COMMUNITY): Payer: Self-pay | Admitting: Registered Nurse

## 2023-07-10 DIAGNOSIS — F32A Depression, unspecified: Secondary | ICD-10-CM | POA: Diagnosis present

## 2023-07-10 LAB — ETHANOL: Alcohol, Ethyl (B): <10 mg/dL (ref <10)

## 2023-07-10 LAB — COMPREHENSIVE METABOLIC PANEL
ALT: 16 U/L (ref 0–44)
AST: 20 U/L (ref 15–41)
Albumin: 4 g/dL (ref 3.5–5.0)
Alkaline Phosphatase: 58 U/L (ref 38–126)
Anion gap: 14 (ref 5–15)
BUN: 10 mg/dL (ref 6–20)
CO2: 19 mmol/L — ABNORMAL LOW (ref 22–32)
Calcium: 9.4 mg/dL (ref 8.9–10.3)
Chloride: 100 mmol/L (ref 98–111)
Creatinine, Ser: 0.84 mg/dL (ref 0.44–1.00)
GFR, Estimated: >60 mL/min (ref >60)
Glucose, Bld: 128 mg/dL — ABNORMAL HIGH (ref 70–99)
Potassium: 3.4 mmol/L — ABNORMAL LOW (ref 3.5–5.1)
Sodium: 133 mmol/L — ABNORMAL LOW (ref 135–145)
Total Bilirubin: 0.5 mg/dL (ref 0.3–1.2)
Total Protein: 7.4 g/dL (ref 6.5–8.1)

## 2023-07-10 LAB — CBC
HCT: 41.4 % (ref 36.0–46.0)
Hemoglobin: 12.6 g/dL (ref 12.0–15.0)
MCH: 21.8 pg — ABNORMAL LOW (ref 26.0–34.0)
MCHC: 30.4 g/dL (ref 30.0–36.0)
MCV: 71.6 fL — ABNORMAL LOW (ref 80.0–100.0)
Platelets: 375 10*3/uL (ref 150–400)
RBC: 5.78 MIL/uL — ABNORMAL HIGH (ref 3.87–5.11)
RDW: 17.5 % — ABNORMAL HIGH (ref 11.5–15.5)
WBC: 8 10*3/uL (ref 4.0–10.5)
nRBC: 0 % (ref 0.0–0.2)

## 2023-07-10 LAB — RAPID URINE DRUG SCREEN, HOSP PERFORMED
Amphetamines: POSITIVE — AB
Barbiturates: NOT DETECTED
Benzodiazepines: NOT DETECTED
Cocaine: NOT DETECTED
Opiates: NOT DETECTED
Tetrahydrocannabinol: POSITIVE — AB

## 2023-07-10 LAB — HCG, SERUM, QUALITATIVE: Preg, Serum: NEGATIVE

## 2023-07-10 LAB — SALICYLATE LEVEL: Salicylate Lvl: <7 mg/dL — ABNORMAL LOW (ref 7.0–30.0)

## 2023-07-10 LAB — ACETAMINOPHEN LEVEL: Acetaminophen (Tylenol), Serum: <10 ug/mL — ABNORMAL LOW (ref 10–30)

## 2023-07-10 NOTE — ED Triage Notes (Signed)
Pt arrived POV from [facility] stating she is dealing with mania and depression but did not feel comfortable there so left AMA and came here.

## 2023-07-10 NOTE — ED Notes (Signed)
Pt sleeping@this time. Breathing even and unlabored. Will continue to monitor for safety 
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
## 2023-07-10 NOTE — ED Notes (Signed)
Patient is screaming in the mielu because  we only have cereal and oatmeal for breakfast. States that the beds are hard . Provider is aware an is discharging the patient.

## 2023-07-10 NOTE — Discharge Instructions (Signed)
[facility]: Outpatient psychiatric Services:   Please see the walk in hours listed below.  Medication Management New Patient needing Medication Management Walk-in, and Existing Patients needing to see a provider for management coming as a walk in   Monday thru Friday 8:00 AM first come first serve until slots are full.  Recommend being there by 7:15 AM to ensure a slot is open.  Therapy New Patient Therapy Intake and Existing Patients needing to see therapist coming in as a walk in.   Monday, Wednesday, and Thursday morning at 8:00 am first come first serve.  Recommend being there by 7:15 AM to ensure a slot is open.    Every 1st, 2nd, and 3rd Friday at 1:00 PM first come first serve until slots are full.  Will still need to come in that morning at 7:15 AM to get registered for an afternoon slot.  For all walk-ins we ask that you arrive by 7:15 am because patients will be seen in there order of arrival (FIRST COME FIRST SERVE) Availability is limited, therefore you may not be seen on the same day that you walk in if all slots are full.    Our goal is to serve and meet the needs of our community to the best of our ability.      [facility] Phone: [phone] Physical Address:  [address] [place], [place]  [address]  Outpatient Services Life can be a challenge for [place] all. [facility]'s outpatient services offer a caring and experienced team of professionals who help people take the first step, which is often the most difficult. Together, we develop a well-defined and customized plan for each person that meets the individual's needs and goals. Each plan includes evidence-based practices as proven strategies that work. From board-certified psychiatrists, registered nurses, therapists, and outpatient office administrative professionals--all care and want to help you and your loved ones in every way  possible to ensure you succeed.  Open Access:   One way we ensure we get people the help they need when they request is is through Open Access. This service encourages individuals who are in dire need of our services and are new to [facility] to simply walk in or call us for virtual options, Monday through Friday between 8 a.m. and 3 p.m. On the same day of contact, if the individual has time to do so, he/she/they will complete patient registration and a comprehensive clinical assessment with a therapist. The assessment will provide treatment recommendations and the individual will leave with an appointment for the next service or a referral to the proper level of care.  While this process takes a few hours and is longer than a traditional appointment, it reduces what could otherwise be months of waiting for help or an appointment.   Telehealth Services:  [facility]'s telehealth services provide a safe, secure, and easy way to connect with a therapist or mental health provider for an individual or group therapy appointment. Click here to learn more about how [facility]'s telehealth services provide an important treatment option. These services may be accessed from the comfort of an individual's home, or at one of [facility]'s behavioral health offices such as this one where an individual may use on-site equipment for the visit.   Telehealth Services   A SAFE, SECURE, CONVENIENT TREATMENT OPTION:  [facility] telehealth services provide you with a safe, secure, and easy way to connect with your therapist or mental health provider  for an individual or group therapy appointment.  Using Psychologist, prison and probation services, telehealth appointments allow you to meet with [employer], therapists, nurse practitioners, and psychiatrists from your desktop or laptop computer, cell phone, or tablet device. Telehealth visits are compliant with all Health Insurance Portability and Accountability Act (HIPAA) requirements and you can  complete a telehealth visit from just about anywhere using internet or wi-fi access.  HOW DOES IT WORK?  [facility] uses the [employer] platform to host telehealth appointments. Prior to your scheduled visit, you will receive a direct link via text or email which will take you to your provider's online waiting room. Simply click that link at your appointment time and your provider will be notified that you've arrived. He or she will meet you online and you will complete your visit. Your provider may also have resources and information posted in his or her virtual waiting room which you may find helpful throughout your treatment.  In addition, you may receive a reminder telephone call from a [place] team member in the days leading up to your appointment. During that call, you will have an opportunity to provide important health information and medication updates which may save time during your scheduled appointment.     WHO USES TELEHEALTH SERVICES?  Telehealth services provide an alternative to in-person, face-to-face treatment for individuals receiving outpatient behavioral health services. At [facility], telehealth visits may also be used by individuals receiving Assertive Community Treatment (ACT) Team and Individual Placement and Support (IPS) services and other community-based, specialized services as needed. Telehealth services are also used for group therapy sessions, allowing people we support to connect during treatment with others who have similar experiences.

## 2023-07-10 NOTE — ED Notes (Signed)
Patient A&O x 4, ambulatory. Patient discharged in no acute distress. Patient denied SI/HI, A/VH upon discharge. Patient verbalized understanding of all discharge instructions explained by staff, to include follow up appointments, Pt belongings returned to patient from locker #  intact. Patient escorted to lobby via staff for transport to destination. Safety maintained.

## 2023-07-10 NOTE — ED Notes (Signed)
Called lab and canceled labs per PA Harris.

## 2023-07-10 NOTE — ED Notes (Signed)
Pt decided to stay and have blood work and EKG done

## 2023-07-10 NOTE — ED Provider Notes (Signed)
FBC/OBS ASAP Discharge Summary  Date and Time: 07/10/2023 10:28 AM  Name: [name]  MRN:  [id number]   Discharge Diagnoses:  Final diagnoses:  Suicidal ideation  Polysubstance abuse (HCC)  Substance induced mood disorder (HCC)    Subjective: [name] is a 47 y/o female admitted to continuous observation on 07/09/2023 after presenting to [facility] with complaints of depression, anxiety, mania, and passive suicidal ideation.    Thamara Milward reassessed face-to-face by this provider, chart reviewed, and consulted with Dr. Jannifer Franklin on 07/10/23 On evaluation, Cordella Stinner is lying in bed but sits up for assessment.  There is no acute distress. Patient is alert and oriented x 4.  Initially patient is cooperative but becomes agitated when she ask for breakfast and is offered oatmeal or cereal.  "All ya'll got is oatmeal or cereal.  I want some real hot food.  They told me I had a bed at [facility] today.  If they don't have a bed let me go the fuck home.  I can't sleep in this another night.  I've got a queen size bed and food at home.  Just let me go home now.  I want to be discharged right now."  Discussed outpatient psychiatric services and patient stating that she was wanting to be restarted on medications but doesn't want an SSRI related to how they make her feel.  States she has done lithium in the past and it worked well for her.  Patient states that she lives a lone but her 72 yr old daughter is supportive.  Discussed coming to [facility] outpatient during walk in hours.  States she gets off work at "7 o'clock in the morning."  Informed she would need to come as soon as she got off work because it was first come first serve.  Patient states she will be able to do that.  Patient denies suicidal/self-harm/homicidal ideation.  Patients' speech is clear, coherent, normal rate and volume.  She maintained good eye contact through out assessment.  Her mood is irritable with congruent affect.   Objectively there is no evidence of psychosis/mania or delusional thinking.  Her responses to assessment questions were relevant and appropriate.  She conversed coherently, with goal directed thoughts, no distractibility, or pre-occupation.  At this time patient is educated and verbalizes understanding of mental health resources and other crisis services in the community. She is instructed to call 911 and present to the nearest emergency room should she experience any suicidal/homicidal ideation, auditory/visual/hallucinations, or detrimental worsening of her mental health condition.  Resources given  Stay Summary: Avani Ash was admitted for Substance induced mood disorder [facility]), crisis management, safety, and stabilization.  Medications treated with during admission are as follows.  traZODone  100 mg Oral QHS   Labs ordered for review:   Lab Orders         SARS Coronavirus 2 by RT PCR (hospital order, performed in [facility] hospital lab) *cepheid single result test* Anterior Nasal Swab         CBC with Differential/Platelet         Comprehensive metabolic panel         Ethanol         TSH         POC urine preg, ED         POCT Urine Drug Screen - (I-Screen)     Improvement was monitored by staff observation and clinical report along with Maytee Linenberger 's verbal report of  emotional status and symptom reduction.  Upon completion of this admission Crissie Leos was both mentally and medically stable for discharge denying suicidal/homicidal ideation, auditory/visual/tactile hallucinations, delusional thoughts, and paranoia.    Hosanna Carbin was evaluated by the treatment team for stability and plans for continued recovery upon discharge. Yubia Holtgrewe 's motivation was an integral factor for scheduling further treatment. Employment, transportation, health status, family support, and any pending legal issues were also considered during stay. She was offered further treatment  options upon discharge including but not limited to Residential, Intensive Outpatient, and Outpatient treatment.   Aeja Garney will follow up with    Discharge Instructions       [facility]: Outpatient psychiatric Services:   Please see the walk in hours listed below.  Medication Management New Patient needing Medication Management Walk-in, and Existing Patients needing to see a provider for management coming as a walk in   Monday thru Friday 8:00 AM first come first serve until slots are full.  Recommend being there by 7:15 AM to ensure a slot is open.  Therapy New Patient Therapy Intake and Existing Patients needing to see therapist coming in as a walk in.   Monday, Wednesday, and Thursday morning at 8:00 am first come first serve.  Recommend being there by 7:15 AM to ensure a slot is open.    Every 1st, 2nd, and 3rd Friday at 1:00 PM first come first serve until slots are full.  Will still need to come in that morning at 7:15 AM to get registered for an afternoon slot.  For all walk-ins we ask that you arrive by 7:15 am because patients will be seen in there order of arrival (FIRST COME FIRST SERVE) Availability is limited, therefore you may not be seen on the same day that you walk in if all slots are full.    Our goal is to serve and meet the needs of our community to the best of our ability.      [facility] Phone: [phone] Physical Address:  [address], [address] [place], [place]  [address]  Outpatient Services Life can be a challenge for [place] all. [facility]'s outpatient services offer a caring and experienced team of professionals who help people take the first step, which is often the most difficult. Together, we develop a well-defined and customized plan for each person that meets the individual's needs and goals. Each plan includes evidence-based practices as proven strategies that  work. From board-certified psychiatrists, registered nurses, therapists, and outpatient office administrative professionals--all care and want to help you and your loved ones in every way possible to ensure you succeed.  Open Access:   One way we ensure we get people the help they need when they request is is through Open Access. This service encourages individuals who are in dire need of our services and are new to [facility] to simply walk in or call us for virtual options, Monday through Friday between 8 a.m. and 3 p.m. On the same day of contact, if the individual has time to do so, he/she/they will complete patient registration and a comprehensive clinical assessment with a therapist. The assessment will provide treatment recommendations and the individual will leave with an appointment for the next service or a referral to the proper level of care.  While this process takes a few hours and is longer than a traditional appointment, it reduces what could otherwise be months of waiting for help  or an appointment.   Telehealth Services:  [facility]'s telehealth services provide a safe, secure, and easy way to connect with a therapist or mental health provider for an individual or group therapy appointment. Click here to learn more about how [facility]'s telehealth services provide an important treatment option. These services may be accessed from the comfort of an individual's home, or at one of [facility]'s behavioral health offices such as this one where an individual may use on-site equipment for the visit.   Telehealth Services   A SAFE, SECURE, CONVENIENT TREATMENT OPTION:  [facility] telehealth services provide you with a safe, secure, and easy way to connect with your therapist or mental health provider for an individual or group therapy appointment.  Using Psychologist, prison and probation services, telehealth appointments allow you to meet with [employer], therapists, nurse practitioners, and psychiatrists from your  desktop or laptop computer, cell phone, or tablet device. Telehealth visits are compliant with all Health Insurance Portability and Accountability Act (HIPAA) requirements and you can complete a telehealth visit from just about anywhere using internet or wi-fi access.  HOW DOES IT WORK?  [facility] uses the [employer] platform to host telehealth appointments. Prior to your scheduled visit, you will receive a direct link via text or email which will take you to your provider's online waiting room. Simply click that link at your appointment time and your provider will be notified that you've arrived. He or she will meet you online and you will complete your visit. Your provider may also have resources and information posted in his or her virtual waiting room which you may find helpful throughout your treatment.  In addition, you may receive a reminder telephone call from a [place] team member in the days leading up to your appointment. During that call, you will have an opportunity to provide important health information and medication updates which may save time during your scheduled appointment.     WHO USES TELEHEALTH SERVICES?  Telehealth services provide an alternative to in-person, face-to-face treatment for individuals receiving outpatient behavioral health services. At [facility], telehealth visits may also be used by individuals receiving Assertive Community Treatment (ACT) Team and Individual Placement and Support (IPS) services and other community-based, specialized services as needed. Telehealth services are also used for group therapy sessions, allowing people we support to connect during treatment with others who have similar experiences.           Total Time spent with patient: 45 minutes  Past Psychiatric History: Depression, anxiety, PTSD, Polysubstance abuse, substance induced mood disorder, suicidal ideation Past Medical History:  Past Medical History:  Diagnosis Date   Abnormal Pap  smear 1998   had colpo   Anxiety    Asthma    ,as child only   no inhaler   Bicornate uterus    Bicornuate uterus    Chlamydia 2000   GERD (gastroesophageal reflux disease)    HCV infection    HX of Hep C   Headache(784.0)    Hypertension 2010   IUP (intrauterine pregnancy), incidental 13 weeks   Opiate dependence (HCC)    stopped methadone 01/2011   PONV (postoperative nausea and vomiting)    STD (female)    HX of STD's    Family History:  Family History  Problem Relation Age of Onset   Hypertension Mother    Mental illness Mother        depression; alot of mental illness.   Cancer Father 84       Colon cancer  age 46   Thyroid disease Maternal Grandmother    Diabetes Paternal Grandfather    Anesthesia problems Neg Hx    Other Neg Hx     Family Psychiatric History: See above Social History:  Social History   Tobacco Use   Smoking status: Every Day    Current packs/day: 0.50    Average packs/day: 0.5 packs/day for 20.0 years (10.0 ttl pk-yrs)    Types: Cigarettes    Start date: 05/04/2014   Smokeless tobacco: Never   Tobacco comments:    using the vapor only  Vaping Use   Vaping status: Former   Substances: Nicotine  Substance Use Topics   Alcohol use: Not Currently    Comment: quit 01/31/23   Drug use: Not Currently    Types: Cocaine, IV, Marijuana, Heroin, Methamphetamines, Other-see comments    Comment: current marijuana usage    Tobacco Cessation:  A prescription for an FDA-approved tobacco cessation medication was offered at discharge and the patient refused  Current Medications:  Current Facility-Administered Medications  Medication Dose Route Frequency Provider Last Rate Last Admin   alum & mag hydroxide-simeth (MAALOX/MYLANTA) 200-200-20 MG/5ML suspension 30 mL  30 mL Oral Q4H PRN Weber, Kyra A, NP       diphenhydrAMINE (BENADRYL) capsule 50 mg  50 mg Oral Q6H PRN Weber, Kyra A, NP   50 mg at 07/09/23 2216   hydrOXYzine (ATARAX) tablet 50 mg  50 mg  Oral TID PRN Phebe Colla A, NP   50 mg at 07/09/23 2216   ibuprofen (ADVIL) tablet 600 mg  600 mg Oral Once PRN [place], Ene A, NP       magnesium hydroxide (MILK OF MAGNESIA) suspension 30 mL  30 mL Oral Daily PRN Weber, Kyra A, NP       traZODone (DESYREL) tablet 100 mg  100 mg Oral QHS Weber, Kyra A, NP   100 mg at 07/09/23 2216   Current Outpatient Medications  Medication Sig Dispense Refill   amLODipine (NORVASC) 10 MG tablet Take 1 tablet (10 mg total) by mouth daily. 90 tablet 1   diclofenac Sodium (VOLTAREN ARTHRITIS PAIN) 1 % GEL Apply 2 g topically 4 (four) times daily. 350 g 1   hydrochlorothiazide (HYDRODIURIL) 25 MG tablet Take 1 tablet (25 mg total) by mouth daily. 90 tablet 1   ibuprofen (ADVIL) 800 MG tablet Take 1 tablet (800 mg total) by mouth every 8 (eight) hours as needed. 30 tablet 0   lisinopril (ZESTRIL) 20 MG tablet Take 1 tablet (20 mg total) by mouth daily. 90 tablet 1   omeprazole (PRILOSEC) 40 MG capsule Take 1 capsule (40 mg total) by mouth daily. 90 capsule 2   ondansetron (ZOFRAN) 4 MG tablet Take 1 tablet (4 mg total) by mouth every 8 (eight) hours as needed for nausea or vomiting. (Patient not taking: Reported on 07/10/2023) 20 tablet 0    PTA Medications:  Facility Ordered Medications  Medication   alum & mag hydroxide-simeth (MAALOX/MYLANTA) 200-200-20 MG/5ML suspension 30 mL   magnesium hydroxide (MILK OF MAGNESIA) suspension 30 mL   diphenhydrAMINE (BENADRYL) capsule 50 mg   hydrOXYzine (ATARAX) tablet 50 mg   traZODone (DESYREL) tablet 100 mg   ibuprofen (ADVIL) tablet 600 mg   PTA Medications  Medication Sig   amLODipine (NORVASC) 10 MG tablet Take 1 tablet (10 mg total) by mouth daily.   hydrochlorothiazide (HYDRODIURIL) 25 MG tablet Take 1 tablet (25 mg total) by mouth daily.   omeprazole (PRILOSEC)  40 MG capsule Take 1 capsule (40 mg total) by mouth daily.   lisinopril (ZESTRIL) 20 MG tablet Take 1 tablet (20 mg total) by mouth daily.    diclofenac Sodium (VOLTAREN ARTHRITIS PAIN) 1 % GEL Apply 2 g topically 4 (four) times daily.   ibuprofen (ADVIL) 800 MG tablet Take 1 tablet (800 mg total) by mouth every 8 (eight) hours as needed.   ondansetron (ZOFRAN) 4 MG tablet Take 1 tablet (4 mg total) by mouth every 8 (eight) hours as needed for nausea or vomiting. (Patient not taking: Reported on 07/10/2023)       03/12/2023    3:30 PM 07/31/2022    6:18 PM 06/12/2021    8:59 AM  Depression screen PHQ 2/9  Decreased Interest 0 2 0  Down, Depressed, Hopeless 0 2 0  PHQ - 2 Score 0 4 0  Altered sleeping 0 2   Tired, decreased energy 0 2   Change in appetite 0 2   Feeling bad or failure about yourself  0 2   Trouble concentrating 0 2   Moving slowly or fidgety/restless 0 0   Suicidal thoughts 0 0   PHQ-9 Score 0 14     Flowsheet Row ED from 07/09/2023 in [facility] ED from 07/04/2023 in [facility] ED from 11/04/2022 in [facility] RISK CATEGORY High Risk No Risk No Risk       Musculoskeletal  Strength & Muscle Tone: within normal limits Gait & Station: normal Patient leans: N/A  Psychiatric Specialty Exam  Presentation  General Appearance:  Appropriate for Environment  Eye Contact: Good  Speech: Clear and Coherent; Normal Rate  Speech Volume: Normal  Handedness: Right   Mood and Affect  Mood: Irritable  Affect: Congruent   Thought Process  Thought Processes: Coherent; Goal Directed; Linear  Descriptions of Associations:Intact  Orientation:Full (Time, Place and Person)  Thought Content:Logical  Diagnosis of Schizophrenia or Schizoaffective disorder in past: No    Hallucinations:Hallucinations: None  Ideas of Reference:None  Suicidal Thoughts:Suicidal Thoughts: No SI Active Intent and/or Plan: With Intent; With Plan  Homicidal Thoughts:Homicidal Thoughts: No   Sensorium  Memory: Immediate  Good; Recent Good; Remote Good  Judgment: Intact  Insight: Present   Executive Functions  Concentration: Good  Attention Span: Good  Recall: Good  Fund of Knowledge: Good  Language: Good   Psychomotor Activity  Psychomotor Activity: Psychomotor Activity: Normal   Assets  Assets: Communication Skills; Desire for Improvement; Financial Resources/Insurance; Housing; Leisure Time; Physical Health; Social Support   Sleep  Sleep: Sleep: Good Number of Hours of Sleep: 2   Nutritional Assessment (For OBS and FBC admissions only) Has the patient had a weight loss or gain of 10 pounds or more in the last 3 months?: No Has the patient had a decrease in food intake/or appetite?: No Does the patient have dental problems?: No Does the patient have eating habits or behaviors that may be indicators of an eating disorder including binging or inducing vomiting?: No Has the patient recently lost weight without trying?: 0 Has the patient been eating poorly because of a decreased appetite?: 0 Malnutrition Screening Tool Score: 0    Physical Exam  Physical Exam Vitals and nursing note reviewed.  Constitutional:      General: She is not in acute distress.    Appearance: Normal appearance. She is not ill-appearing.  HENT:     Head: Normocephalic.  Eyes:  Conjunctiva/sclera: Conjunctivae normal.  Cardiovascular:     Rate and Rhythm: Normal rate.  Pulmonary:     Effort: Pulmonary effort is normal. No respiratory distress.  Musculoskeletal:        General: Normal range of motion.     Cervical back: Normal range of motion.  Skin:    General: Skin is warm and dry.  Neurological:     Mental Status: She is alert and oriented to person, place, and time.  Psychiatric:        Attention and Perception: Attention and perception normal. She does not perceive auditory or visual hallucinations.        Mood and Affect: Mood is anxious. Angry: irritable.        Speech: Speech  normal.        Behavior: Behavior is agitated. Behavior is cooperative.        Thought Content: Thought content normal. Thought content is not paranoid or delusional. Thought content does not include homicidal or suicidal ideation.        Cognition and Memory: Cognition and memory normal.        Judgment: Judgment is impulsive.   Review of Systems  Constitutional:        No other complaints voiced  Psychiatric/Behavioral:  Positive for substance abuse. Depression: Stable. Hallucinations: Denies. Suicidal ideas: Denies.Nervous/anxious: Stable.   All other systems reviewed and are negative.  Blood pressure 114/86, pulse 86, temperature 98.6 F (37 C), temperature source Oral, resp. rate 18, SpO2 98%. There is no height or weight on file to calculate BMI.  Demographic Factors:  Caucasian  Loss Factors: NA  Historical Factors: Impulsivity  Risk Reduction Factors:   Sense of responsibility to family, Employed, and Positive social support  Continued Clinical Symptoms:  Alcohol/Substance Abuse/Dependencies Previous Psychiatric Diagnoses and Treatments  Cognitive Features That Contribute To Risk:  None    Suicide Risk:  Minimal: No identifiable suicidal ideation.  Patients presenting with no risk factors but with morbid ruminations; may be classified as minimal risk based on the severity of the depressive symptoms  Plan Of Care/Follow-up recommendations:  Other:  Follow up with resources given  Disposition: No evidence of imminent risk to self or others at present.   Patient does not meet criteria for psychiatric inpatient admission. Supportive therapy provided about ongoing stressors. Discussed crisis plan, support from social network, calling 911, coming to the Emergency Department, and calling Suicide Hotline. Outpatient psychiatric services for medication management and therapy  Bethany Cumming, NP 07/10/2023, 10:28 AM

## 2023-07-10 NOTE — ED Notes (Signed)
Patient alert and oriented x 3. Denies SI/HI/AVH. Denies intent or plan to harm self or others. Routine conducted according to faculty protocol. Encourage patient to notify staff with any needs or concerns. Patient verbalized agreement and understanding. Will continue to monitor for safety. 

## 2023-07-10 NOTE — ED Notes (Signed)
Patient  sleeping in no acute stress. RR even and unlabored .Environment secured .Will continue to monitor for safely. 

## 2023-07-10 NOTE — ED Notes (Signed)
When writer approached pt and introduced herself and explained to her about the process that was about to happen pt was upset and Glass blower/designer out and was refusing to have blood work done on her

## 2023-07-10 NOTE — ED Provider Notes (Signed)
[place] [facility]GENC[facility] DE[facility]MENT AT [facility] Provider Note   CSN: [id number] Arrival date & time: 07/10/23  1133     History  Chief Complaint  Patient presents with   Psychiatric Evaluation    [name] is a 47 y.o. female Who presents to the emergency department after being discharged from behavioral health urgent care earlier today.  Patient claims that she left AMA and that the provider is lying.  She reports multiple social issues including break-up with her boyfriend and feeling depressed and also has been manic for an entire month..  She took herself off all of her medications a month ago and now feels depressed and has thoughts of feeling like she just does not really want to be alive but has no active plans and states openly "I would never kill myself."  Patient also denies homicidal ideation or audiovisual hallucinations.  Patient states that she felt uncomfortable in in a room with other men who have mental health problems and wanted to leave be helped.  I spoke directly with the previous provider states that the patient was unhappy with the meal she was served and also did not want a wait for a bed.  She was reassessed by our the psychiatric team, deemed stable and given outpatient resources.  HPI     Home Medications Prior to Admission medications   Medication Sig Start Date End Date Taking? Authorizing Provider  amLODipine (NORVASC) 10 MG tablet Take 1 tablet (10 mg total) by mouth daily. 03/12/23   Claiborne Rigg, NP  diclofenac Sodium (VOLTAREN ARTHRITIS PAIN) 1 % GEL Apply 2 g topically 4 (four) times daily. 05/10/23   [name], PA-C  hydrochlorothiazide (HYDRODIURIL) 25 MG tablet Take 1 tablet (25 mg total) by mouth daily. 03/12/23   Claiborne Rigg, NP  ibuprofen (ADVIL) 800 MG tablet Take 1 tablet (800 mg total) by mouth every 8 (eight) hours as needed. 05/10/23   [name], PA-C  lisinopril (ZESTRIL) 20 MG tablet Take 1 tablet  (20 mg total) by mouth daily. 03/12/23   Claiborne Rigg, NP  omeprazole (PRILOSEC) 40 MG capsule Take 1 capsule (40 mg total) by mouth daily. 03/12/23   Claiborne Rigg, NP  ondansetron (ZOFRAN) 4 MG tablet Take 1 tablet (4 mg total) by mouth every 8 (eight) hours as needed for nausea or vomiting. Patient not taking: Reported on 07/10/2023 04/17/23   Jannifer Rodney A, FNP  buPROPion ER [facility] SR) 100 MG 12 hr tablet Take 1 tablet by mouth every morning for 1 week. Then take 1 tablet twice each day. 07/21/22 08/04/22    lamoTRIgine (LAMICTAL) 25 MG tablet TAKE 1 TABLET BY MOUTH ONCE DAILY FOR 2 WEEKS. THEN TAKE 2 TABLETS DAILY. 08/04/22 09/22/22    losartan-hydrochlorothiazide (HYZAAR) 100-25 MG tablet Take 1 tablet by mouth daily. 06/10/19 01/02/20  Mardella Layman, MD  pantoprazole (PROTONIX) 40 MG tablet Take 1 tablet (40 mg total) by mouth daily. 08/15/20 09/05/20  Hoy Register, MD  prazosin (MINIPRESS) 1 MG capsule Take 1 capsule by mouth every night at bedtime. 07/01/21 08/06/21    venlafaxine XR (EFFEXOR-XR) 37.5 MG 24 hr capsule Take 1 capsule by mouth every day with food 07/01/21 08/06/21        Allergies    Hydrocodone-acetaminophen    Review of Systems   Review of Systems  Physical Exam Updated Vital Signs BP (!) 136/106 (BP Location: Right Arm)   Pulse 94   Temp 98.6  F (37 C) (Oral)   Resp 15   Ht 5\' 1"  (1.549 m)   Wt 64.9 kg   SpO2 100%   BMI 27.02 kg/m  Physical Exam Vitals and nursing note reviewed.  Constitutional:      General: She is not in acute distress.    Appearance: She is well-developed. She is not diaphoretic.  HENT:     Head: Normocephalic and atraumatic.     Right Ear: External ear normal.     Left Ear: External ear normal.     Nose: Nose normal.     Mouth/Throat:     Mouth: Mucous membranes are moist.  Eyes:     General: No scleral icterus.    Conjunctiva/sclera: Conjunctivae normal.  Cardiovascular:     Rate and Rhythm: Normal rate and regular  rhythm.     Heart sounds: Normal heart sounds. No murmur heard.    No friction rub. No gallop.  Pulmonary:     Effort: Pulmonary effort is normal. No respiratory distress.     Breath sounds: Normal breath sounds.  Abdominal:     General: Bowel sounds are normal. There is no distension.     Palpations: Abdomen is soft. There is no mass.     Tenderness: There is no abdominal tenderness. There is no guarding.  Musculoskeletal:     Cervical back: Normal range of motion.  Skin:    General: Skin is warm and dry.  Neurological:     Mental Status: She is alert and oriented to person, place, and time.  Psychiatric:        Attention and Perception: Attention and perception normal.        Mood and Affect: Mood is not anxious or elated. Affect is not labile, flat, tearful or inappropriate.        Speech: She is communicative. Speech is not rapid and pressured, delayed or tangential.        Behavior: Behavior normal. Behavior is cooperative.        Thought Content: Thought content normal. Thought content is not paranoid or delusional. Thought content does not include homicidal or suicidal ideation. Thought content does not include homicidal or suicidal plan.     ED Results / Procedures / Treatments   Labs (all labs ordered are listed, but only abnormal results are displayed) Labs Reviewed  COMPREHENSIVE METABOLIC PANEL  ETHANOL  SALICYLATE LEVEL  ACETAMINOPHEN LEVEL  CBC  RAPID URINE DRUG SCREEN, HOSP PERFORMED  HCG, SERUM, QUALITATIVE    EKG None  Radiology No results found.  Procedures Procedures    Medications Ordered in ED Medications - No data to display  ED Course/ Medical Decision Making/ A&P                                 Medical Decision Making 47 year old female who comes to the emergency department after being discharged from [facility].  She is not homicidal, not suicidal, not psychotic.  She is sitting still in a chair coloring she is not tangential, hyperactive,  tearful, flat.  Patient is asking to eat. I discussed having a direct conversation via telephone call with the previous provider and that she was discharged and deemed safe at this time.  Patient became extremely angry stating that this is not true and she left AGAINST MEDICAL ADVICE.  This is not reflected in the chart nor is it the case per previous provider.  She does not seem to be manic based on examination.  Patient states "what you guys going to do if I leave here and kill myself?).  I then reminded the patient that she had just stated that she would never kill herself.  Patient wanted my name and previous provider name which I told her would be available with her discharge paperwork and her previous AVS that had all of the information for outpatient resources but I would be happy to reprint them.  Patient stated "this is a fucking joke." and stormed out.           Final Clinical Impression(s) / ED Diagnoses Final diagnoses:  None    Rx / DC Orders ED Discharge Orders     None         Arthor Captain, PA-C 07/10/23 1443    Gloris Manchester, MD 07/11/23 [phone]

## 2023-07-13 ENCOUNTER — Telehealth: Payer: MEDICAID | Admitting: Nurse Practitioner

## 2023-07-14 ENCOUNTER — Other Ambulatory Visit: Payer: Self-pay

## 2023-07-14 MED ORDER — MIRTAZAPINE 15 MG PO TABS
15.0000 mg | ORAL_TABLET | Freq: Every day | ORAL | 0 refills | Status: DC
  Filled 2023-07-14: qty 30, 30d supply, fill #0

## 2023-07-14 MED ORDER — ARIPIPRAZOLE 5 MG PO TABS
5.0000 mg | ORAL_TABLET | Freq: Every day | ORAL | 0 refills | Status: DC
  Filled 2023-07-14: qty 30, 30d supply, fill #0

## 2023-07-14 MED ORDER — SERTRALINE HCL 50 MG PO TABS
50.0000 mg | ORAL_TABLET | Freq: Every day | ORAL | 0 refills | Status: AC
  Filled 2023-07-14: qty 30, 30d supply, fill #0

## 2023-07-14 MED ORDER — ATORVASTATIN CALCIUM 20 MG PO TABS
20.0000 mg | ORAL_TABLET | Freq: Every day | ORAL | 0 refills | Status: DC
  Filled 2023-07-14: qty 30, 30d supply, fill #0

## 2023-07-14 MED ORDER — BUSPIRONE HCL 10 MG PO TABS
10.0000 mg | ORAL_TABLET | Freq: Three times a day (TID) | ORAL | 0 refills | Status: DC
  Filled 2023-07-14: qty 90, 30d supply, fill #0

## 2023-07-15 ENCOUNTER — Other Ambulatory Visit: Payer: Self-pay

## 2023-07-20 ENCOUNTER — Telehealth (INDEPENDENT_AMBULATORY_CARE_PROVIDER_SITE_OTHER): Payer: Self-pay | Admitting: Internal Medicine

## 2023-07-20 NOTE — Telephone Encounter (Signed)
Copied from CRM [id number]. Topic: Appointment Scheduling - Scheduling Inquiry for Clinic >> Jul 19, 2023  2:39 PM Phill Myron wrote: Patient states she needs a shot., declined to say what shot. Please advise

## 2023-07-20 NOTE — Telephone Encounter (Signed)
Spoke with patient regarding injection.  She states was admitted to [facility] at [facility] the day she was scheduled to have a VV with Dr. Laural Benes.   At discharge, patient was administered an injection for her mental health- "naltrexone". She states this injection is to be administered monthly.  Would like to set up a plan to have the next one administerd.   She also states she was advised to f/u with [facility] but she is unable to reach them. No one there will answer the phone.  (Per note from d/c summary -Current Discharge Plan: [facility] or outpatient SA treatment )   Schedule a MyChart apt for 07/28/2023 at 0810.

## 2023-07-21 ENCOUNTER — Encounter: Payer: Managed Care, Other (non HMO) | Admitting: Gastroenterology

## 2023-07-21 NOTE — Telephone Encounter (Signed)
Noted  

## 2023-07-27 ENCOUNTER — Telehealth: Payer: MEDICAID | Admitting: Internal Medicine

## 2023-07-27 ENCOUNTER — Other Ambulatory Visit: Payer: Self-pay

## 2023-07-27 ENCOUNTER — Encounter: Payer: Self-pay | Admitting: Internal Medicine

## 2023-07-27 DIAGNOSIS — F319 Bipolar disorder, unspecified: Secondary | ICD-10-CM

## 2023-07-27 DIAGNOSIS — F19982 Other psychoactive substance use, unspecified with psychoactive substance-induced sleep disorder: Secondary | ICD-10-CM | POA: Diagnosis not present

## 2023-07-27 DIAGNOSIS — F1021 Alcohol dependence, in remission: Secondary | ICD-10-CM

## 2023-07-27 DIAGNOSIS — F1910 Other psychoactive substance abuse, uncomplicated: Secondary | ICD-10-CM

## 2023-07-27 MED ORDER — TRAZODONE HCL 50 MG PO TABS
50.0000 mg | ORAL_TABLET | Freq: Every day | ORAL | 2 refills | Status: AC
Start: 2023-07-27 — End: ?
  Filled 2023-07-27 – 2023-09-03 (×3): qty 30, 30d supply, fill #0
  Filled 2023-10-19: qty 30, 30d supply, fill #1

## 2023-07-27 NOTE — Progress Notes (Signed)
Virtual Visit via Video Note  I connected with [name] on 07/27/2023 at 8:12 AM by a video enabled telemedicine application and verified that I am speaking with the correct person using two identifiers.  Location: Patient: home Provider: Office   I discussed the limitations of evaluation and management by telemedicine and the availability of in person appointments. The patient expressed understanding and agreed to proceed.  History of Present Illness: Pt with hx of GERD, anxiety, depression, bipolar, migraines, HTN, preDM, hep C, history of polysubstance abuse (methamphetamine, cocaine, heroin), alcohol use disorder, IDA/PICA.   Patient presents today to request  monthly shots of naltrexone to help maintain sobriety. Patient with history of polysubstance abuse including methamphetamines, narcotics/narcotic pills, marijuana, EtOH abuse.  She was recently hospitalized through [facility] from 8/17-21/2024 for SI, HI and relapse on street drugs. Patient was discharged on Zoloft 50 mg daily, BuSpar 10 mg 3 times a day, Abilify 5 mg daily, Remeron 15 mg at bedtime, naltrexone pills of 50 mg to use for breakthrough alcohol cravings.  However she did receive the injection naltrexone on August 21 prior to leaving the program.  Patient states she was told to call [facility] to continue to receive monthly naltrexone injections.  However she called them and was told that they only gave to patient's in their facility. Reports that she is doing well mentally and has not felt this good in a long time.  She had breakthrough cravings for alcohol yesterday and took 1/2 of 50 mg tab of naltrexone with good results. Concerned about weight gain with the psychiatric medications.  She has cut most of her doses herself.  She decreased BuSpar to 5 mg twice a day, Zoloft 50 mg half tablet daily and Abilify 5 mg 1/2 tab.  She continues to take Remeron 15 mg at night which allows her to get 8 hours of sleep.  Prior to this  she had not been sleeping well.  She is also on Lipitor 20 mg daily for hyperlipidemia. She has touch base with [facility] to try to get in with a mental health provider.  She would like to see a psychiatrist and get counseling.  She has been to the [facility] office 3 times last week and they told her that they will give a list of behavioral health providers that accept her Medicaid but so far she has not received this yet. Outpatient Encounter Medications as of 07/27/2023  Medication Sig Note   amLODipine (NORVASC) 10 MG tablet Take 1 tablet (10 mg total) by mouth daily.    ARIPiprazole (ABILIFY) 5 MG tablet Take 1 tablet (5 mg total) by mouth daily.    atorvastatin (LIPITOR) 20 MG tablet Take 1 tablet (20 mg total) by mouth at bedtime.    busPIRone (BUSPAR) 10 MG tablet Take 1 tablet (10 mg total) by mouth 3 (three) times daily.    diclofenac Sodium (VOLTAREN ARTHRITIS PAIN) 1 % GEL Apply 2 g topically 4 (four) times daily.    hydrochlorothiazide (HYDRODIURIL) 25 MG tablet Take 1 tablet (25 mg total) by mouth daily.    ibuprofen (ADVIL) 800 MG tablet Take 1 tablet (800 mg total) by mouth every 8 (eight) hours as needed.    lisinopril (ZESTRIL) 20 MG tablet Take 1 tablet (20 mg total) by mouth daily.    mirtazapine (REMERON) 15 MG tablet Take 1 tablet (15 mg total) by mouth at bedtime.    omeprazole (PRILOSEC) 40 MG capsule Take 1 capsule (40 mg total) by  mouth daily.    ondansetron (ZOFRAN) 4 MG tablet Take 1 tablet (4 mg total) by mouth every 8 (eight) hours as needed for nausea or vomiting. (Patient not taking: Reported on 07/10/2023)    sertraline (ZOLOFT) 50 MG tablet Take 1 tablet (50 mg total) by mouth daily.    [DISCONTINUED] buPROPion ER (WELLBUTRIN SR) 100 MG 12 hr tablet Take 1 tablet by mouth every morning for 1 week. Then take 1 tablet twice each day.    [DISCONTINUED] lamoTRIgine (LAMICTAL) 25 MG tablet TAKE 1 TABLET BY MOUTH ONCE DAILY FOR 2 WEEKS. THEN TAKE 2 TABLETS DAILY.     [DISCONTINUED] losartan-hydrochlorothiazide (HYZAAR) 100-25 MG tablet Take 1 tablet by mouth daily. 01/02/2020: Needs refill 01/02/2020   [DISCONTINUED] pantoprazole (PROTONIX) 40 MG tablet Take 1 tablet (40 mg total) by mouth daily.    [DISCONTINUED] prazosin (MINIPRESS) 1 MG capsule Take 1 capsule by mouth every night at bedtime.    [DISCONTINUED] venlafaxine XR (EFFEXOR-XR) 37.5 MG 24 hr capsule Take 1 capsule by mouth every day with food    No facility-administered encounter medications on file as of 07/27/2023.        Observations/Objective: Middle-aged Caucasian female in NAD  Assessment and Plan: 1. Alcohol use disorder, severe, in early remission [facility]) Patient reports she will be 30 days clean on the 10th of this month.  She would like to get injection naltrexone and will be due again for it on 21 September.  However she reports breakthrough cravings yesterday for which she took an oral naltrexone. -Advised patient to continue to take the oral naltrexone if she has breakthrough cravings.  After September 21, she may want to consider whether to stay on oral naltrexone instead if she gets a lot of break through cravings on inj and take it every day versus switching to the once monthly injections.  In the meantime, I will try to find out the logistics of getting injection naltrexone should she decide she would prefer to stay on that instead.  2. Polysubstance abuse (HCC) Commended her on being drug-free.  However she continues to use marijuana.  3. Drug-induced insomnia (HCC) Patient concerned about weight gain with psychiatric medications.  I told her that the Remeron can cause significant weight gain through increased appetite.  She would like to be switched to trazodone instead which she states worked just as well for her. - traZODone (DESYREL) 50 MG tablet; Take 1 tablet (50 mg total) by mouth at bedtime.  Dispense: 30 tablet; Refill: 2  4.  Bipolar depression: I have sent patient  information via MyChart of the list of behavioral health providers in the [place] area where she can get set up with a counselor and for medication management. -I have also submitted a referral on her behalf.  Follow Up Instructions: 3 wks   I discussed the assessment and treatment plan with the patient. The patient was provided an opportunity to ask questions and all were answered. The patient agreed with the plan and demonstrated an understanding of the instructions.   The patient was advised to call back or seek an in-person evaluation if the symptoms worsen or if the condition fails to improve as anticipated.  I spent 24 minutes dedicated to the care of this patient on the date of this encounter to include previsit review of records including records from [facility] and [facility], face-to-face time with patient discussing diagnosis and management, post visit entering orders and sending patient MyChart message with behavioral  health resources.  This note has been created with Education officer, environmental. Any transcriptional errors are unintentional.  Jonah Blue, MD

## 2023-07-29 ENCOUNTER — Telehealth: Payer: Self-pay | Admitting: Internal Medicine

## 2023-07-29 ENCOUNTER — Telehealth: Payer: Self-pay | Admitting: Licensed Clinical Social Worker

## 2023-07-29 NOTE — Telephone Encounter (Signed)
Dental resource list that accept medicaid in the [place] and surrounding areas sent to patient via mail.

## 2023-07-29 NOTE — Telephone Encounter (Signed)
Sent resources via my chart. And pt would like me to send her dental information that accept medicaid

## 2023-07-29 NOTE — Telephone Encounter (Signed)
Patient wanted dental and rehab info. Both were sent to her my chart.  [facility]one Clinics in [place], [place]  [facility] [address], [place] [address] [phone] Mon-Fri 6:30-10am  [facility]  [address], [place] [address] New patients- [phone]  -Accepts Medicaid and other commercial insurances.  -Payment plans start at $15 a day.  -New Patients should call number to schedule an appointment  [facility] [address] [place], [place] [address] [phone] & [phone] -Accepts Medicaid and other commercial insurances. -New patients should call number to complete new patient assessment   [facility] [address] [place], [address] [place], [place] [address] Phone: [phone] -Accepts Medicaid and other commercial insurances.   [facility] 4.7 (331)  Dentist 2.3 mi  [address] [phone]  "Dr Fleet Contras was great and so was the assistant."  [facility] 4.3 (959)  Dentist 2.1 mi  [address]  "They were able to get my son in same day for a dental emergency."  [facility] 4.9 (671)  Dentist 2.0 mi  [address]  "They offer an office plan for those without dental insurance."  [facility]ing Resources   [email or web address].com.br  [facility] [address], [place], [place] [address] [phone] or [phone] Walk-in urgent care 24/7 for anyone  For [facility] ONLY New patient assessments and therapy walk-ins: Monday and Wednesday 8am-11am First and second Friday 1pm-5pm New patient psychiatry and medication management walk-ins: Mondays, Wednesdays, Thursdays, Fridays 8am-11am No psychiatry walk-ins on Tuesday   *Accepts all insurance and uninsured for Urgent Care needs *Accepts Medicaid and uninsured for outpatient  treatment   [facility] (Therapy and psychiatry) [facility] (near [employer]) [address], [address] [place], [place] [address] [phone] Fax: [phone] (INSURANCE REQUIRED-MEDICAID ACCEPTED)   [facility] Address: Four Locations  -[address] [place], [place], [place]                                 Phone: [phone] -[address], [place], [place]         Phone: [phone] -[address], [place], [place]                      Phone: [phone] -[address], [place], [place]         Phone: [phone] Age Range: Children, Adolescents, and Adults Specialty Areas: Depression, Anxiety, ADHD, Substance Abuse, Bipolar Disorder, etc.  [facility] Address: [address] [place], [place] Phone: [phone] Age Range: Children, Adults, and Elders Specialty Areas: Couple, Family, Group, Individual     [facility] Address: [address] [place], [place] Phone: [phone] Age Range: Children, Adolescents, and Adults Specialty Areas: Individual, Family and Couples Therapy, and Substance Abuse  [facility] Address: [address] [place], [place] [address] Phone: [phone] Age Range: Adolescents/Teenagers and Adults Specialty Areas: Individual, Family, Couples, and Group Counseling   [facility] Address: [address] [place], [place] Phone: [phone] Age Range: Children and Adults Specialty Areas: Individual, Group, and Family counseling to people who have experienced domestic violence or sexual assault  [facility] Address: [address] [place], [place]  [address] Phone: [phone] Age Range: Adults & Children (Ages 3+) Specialty Areas: Mental Health and Substance Abuse Counseling Services  [facility] Address: [address] [place], [place], [place] [address] Phone:  [phone]             Age Range: All Ages  Specialty Areas: Common mental health diagnoses such as Anxiety, Depression, ADD/ADHD, Bipolar, and PTSD, Substance Abuse Evaluations and Counseling   [facility] at [facility] [address] [place] [address] [place],  [place]  [address] [phone] Call for appointment  [facility] [place] (Therapy only)  [facility] [address], [place], [place] [address] Monday - Friday: 8:30 a.m.-12 p.m. / 1 p.m.-2:30 p.m.  [facility] [address], [place], [place] [address] Monday-Friday: 8:30 a.m.-12 p.m. / 2-3:30 p.m. (INSURANCE REQUIRED -MEDICAID ACCEPTED) They do offer a sliding fee scale $20-$30/session   [facility] [address] [place], [place] [address] Phone: [phone]  [facility] [address] [place] [address]  Phone: [phone] (Does not accept Medicaid) (only one provider accepts Medicare)  [facility] [address] (at [employer], [place] [address] (Accepts Medicaid and Medicare)  [facility]) [address]  [place], [place] [address]  Phone: [phone]  [address] [place], [place] [address] Phone: [phone] [facility] Medicaid) [facility] (Therapy only)  [address] [place], [place] [address] Phone: [phone]   [facility] (Therapy only)  [address] [place], [place] [address] Phone: [phone] [facility] & Medicare)   [facility] [address] [place], [place] [address] Phone: [phone] (Accepts [place] Health Choice) [facility] [phone] [address] [place], [place] [address] Phone: [phone] [facility]) [facility] (Psychiatry only)  [phone] [address], [place], [place] [address] (Accepts  Medicaid and Medicare) [facility] (Psychiatry and therapy)  [address] [place], [place] [address] [phone] [facility] Medicare) [facility] (psychiatry and therapy) [address], [place], [place] [address] [phone]  [facility] at [address], [place], [place] [address] [phone] Accept [address], [place], [place], [place], [place],  and the following types of Medicaid; Alliance, [place], Partners, [place], [place] Health Choice, [employer], Healthy [place], [place], and [place], as well as offering a Careers adviser and private payment options. Provides In-Office Appointments, Virtual Appointments, and Phone Consultations Offers medication management for ages 77 years old and up, including,  Medication Management for Suboxone and [facility] [phone] [address], [place], [place] [address] (Accepts Medicaid and Medicare)         19.  [facility] (therapy only)  [address] [place], [place] [address]            [phone] (Accepts medicare) 20. [facility]  (Suboxone and methodone) [phone] [address] [place], [place] [address] To Be Eligible for Opioid Treatment at [facility] you must be at least 45 years of age you have already tried other interventions that were not successful such as opioid detox, inpatient rehab for opioids, or outpatient counseling specifically for opioid dependency your [facility] drug test must be completely free of benzodiazepines (klonopin, xanax, valium, ativan, or other benz) you have reliable transportation to the [facility] clinic in [place] you recognize  that counseling is a critical component of [facility]' Opioid Program and you agree to attend all required counseling sessions you are committed to total drug abstinence and will conscientiously strive to remain free of alcohol, marijuana, and other illicit  substances while in treatment you desire a peaceful treatment atmosphere in which personal responsibility and respect toward staff and clients is the norm   21. [facility] [address] [place], [place], [place] [address] Offers SAIOP (Substance Abuse Intensive Outpatient Program) [phone] 22. [facility] [address] [place], [place] [address] [phone] (Accepts medicare)  For those who are tech savvy, go on psychology today, type in your local city (i.e. [place]) and specify your insurance at the top of the screen after you search. (Medicaid if needed). You can also specify whether you are interested in therapy and psychiatry.  www.psychologytoday.com/us

## 2023-08-02 ENCOUNTER — Encounter: Payer: Self-pay | Admitting: Internal Medicine

## 2023-08-02 ENCOUNTER — Other Ambulatory Visit: Payer: Self-pay

## 2023-08-02 NOTE — Progress Notes (Signed)
I received a letter from [facility] informing me that patient has an appointment with him 08/09/2023.

## 2023-08-03 ENCOUNTER — Other Ambulatory Visit: Payer: Self-pay

## 2023-08-04 ENCOUNTER — Ambulatory Visit: Payer: Self-pay | Admitting: *Deleted

## 2023-08-04 NOTE — Telephone Encounter (Signed)
Thank you for sharing. Glad they will be able to offer this treatment.

## 2023-08-04 NOTE — Telephone Encounter (Signed)
Noted  

## 2023-08-04 NOTE — Telephone Encounter (Signed)
Patient has upcoming appointment with [facility] on 08/09/2023.  I spoke with one of the nurses there today.  He confirms that the patient is on their schedule and that they do offer Vivitrol for treatment of alcohol use disorder.

## 2023-08-04 NOTE — Telephone Encounter (Signed)
  Chief Complaint: Visual changes and dizziness  Symptoms: My vision is like I'm looking through a plastic bag in both eyes.   Having dizziness where she is having to hold onto things to ambulate Frequency: Vision changes over the last 6 months and getting worse.   Dizziness is a new issue happening daily. Pertinent Negatives: Patient denies any numbness, tingling or weakness on one side of her body, no facial droop or speech issues. Disposition: [x] ED /[] Urgent Care (no appt availability in office) / [] Appointment(In office/virtual)/ []  [facility]/ [] Home Care/ [] Refused Recommended Disposition /[] [facility]/ []  Follow-up with PCP Additional Notes: I have referred this pt to the ED.   She is agreeable to going.  She has to be at work at 3:00 today.   She was going to wait and go tomorrow but I encouraged her to go on this morning because her symptoms are concerning. She is going to the ED at [facility] in [place]. I have sent a message to Dr. Laural Benes.   I am also sending a referral request for an eye doctor that takes Medicaid.

## 2023-08-04 NOTE — Telephone Encounter (Signed)
Message from Blenner[place]assett M sent at 08/04/2023  8:02 AM EDT  Summary: issues with her eye's vision is cloudy   Pt stated is having issues with her eye's vision is cloudy, feeling of a curtain/zip log bag crossing vision,stated has new glasses, and they don't help much.Mentioned it's been progressing throughout the year and only getting worse.  Seeking clinical advice.          Call History  Contact Date/Time Type Contact Phone/Fax User  08/04/2023 07:52 AM EDT Phone (Incoming) Daphine Deutscher, [address] "Chrissy" (Self) [phone] Judie Petit) McGill, Alondra   Reason for Disposition  Patient sounds very sick or weak to the triager    Visual changes and dizziness  Answer Assessment - Initial Assessment Questions 1. DESCRIPTION: "How has your vision changed?" (e.g., complete vision loss, blurred vision, double vision, floaters, etc.)    My vision is like I'm looking through a plastic bag.   I have new glasses coming.   I was not having these symptoms when I saw my eye doctor   It's gotten worse over the last 6 months.   My mother had AMD.   It runs in my family.   I want to check if I have this.   I let her know she would need to see an eye doctor to be checked for this.   She is requesting a referral to an eye doctor that takes Medicaid.   The eye dr she saw before does not accept Medicaid.   She had private insurance when she saw the prior eye doctor.   2. LOCATION: "One or both eyes?" If one, ask: "Which eye?"     It seems to be both eyes.  When I cover one eye or the other both eyes are like I'm looking through a plastic bag.  No headaches but I did have one the other day but I don't usually have headaches. 3. SEVERITY: "Can you see anything?" If Yes, ask: "What can you see?" (e.g., fine print)     "It looks like I'm looking through a plastic bag"  I get dizzy a lot.   This is new.  I have to hold onto things when I get up.   I have to sit back down.   Sometimes I'm standing and I get real dizzy.   It  happens when I stand up a lot.    I guess because I'm getting older.   The blurry vision is scary though.   I'm worried about it.   4. ONSET: "When did this begin?" "Did it start suddenly or has this been gradual?"     She is having visual changes and dizziness.   The dizziness is new.   See above.   The visual changes are concerning her.  They have been going on for over 6 months. 5. PATTERN: "Does this come and go, or has it been constant since it started?"     Constant the blurry vision.   The dizziness happens a lot especially when I get up from standing or am standing for a while.   I have to sit back down.   When I do get up I have to hold onto things to keep my balance. 6. PAIN: "Is there any pain in your eye(s)?"  (Scale 1-10; or mild, moderate, severe)   - NONE (0): No pain.   - MILD (1-3): Doesn't interfere with normal activities.   - MODERATE (4-7): Interferes with normal activities or awakens from sleep.    -  SEVERE (8-10): Excruciating pain, unable to do any normal activities.     No pain.   Did have a headache the other day which she doesn't usually get. 7. CONTACTS-GLASSES: "Do you wear contacts or glasses?"     I have glasses now.    I've got new glasses coming in a couple of days.   She did not tell the eye dr. She saw about her concern for the AMD when I asked her about it.  8. CAUSE: "What do you think is causing this visual problem?"     My mother had AMD.   I'm concerned I might have it too.   My vision is getting worse.   I need a referral to an eye dr that will take Medicaid. 9. OTHER SYMPTOMS: "Do you have any other symptoms?" (e.g., confusion, headache, arm or leg weakness, speech problems)     Had a headache the other day which she doesn't usually get.   The dizziness mentioned above.    Denies weakness, numbness on one side of her body, denies speech issues like recalling her words, denies slurred speech, denies drooping of one side of her face.   10. PREGNANCY: "Is  there any chance you are pregnant?" "When was your last menstrual period?"       Not asked  Protocols used: Vision Loss or Change-A-AH

## 2023-08-06 ENCOUNTER — Telehealth: Payer: Self-pay

## 2023-08-06 NOTE — Telephone Encounter (Signed)
Copied from CRM [id number]. Topic: Appointment Scheduling - Scheduling Inquiry for Clinic >> Aug 04, 2023  7:51 AM [place] wrote: Reason for CRM: Pt stated she had an appointment with Dr. Laural Benes yesterday that she missed because she has a new phone number. Stated she really needs to see Dr. Laural Benes as soon as possible pt asking to be worked in.    Please advise.

## 2023-08-06 NOTE — Telephone Encounter (Signed)
Called but no answer. LVM to call back.  Patient did nt have a scheduled appointment for 08/05/2023. Please schedule an appointment with Dr.Giramata on 08/14/23 if patient agrees. No available appointments with Dr.Johnson in September at this time.

## 2023-08-09 ENCOUNTER — Other Ambulatory Visit (HOSPITAL_COMMUNITY): Payer: Self-pay

## 2023-08-09 MED ORDER — ATOMOXETINE HCL 40 MG PO CAPS
40.0000 mg | ORAL_CAPSULE | Freq: Every day | ORAL | 0 refills | Status: DC
Start: 2023-08-09 — End: 2023-09-16
  Filled 2023-08-09: qty 30, 30d supply, fill #0

## 2023-08-09 MED ORDER — ARIPIPRAZOLE 5 MG PO TABS
5.0000 mg | ORAL_TABLET | Freq: Every day | ORAL | 1 refills | Status: DC
  Filled 2023-08-09 – 2023-09-03 (×4): qty 30, 30d supply, fill #0

## 2023-08-09 MED ORDER — SERTRALINE HCL 100 MG PO TABS
100.0000 mg | ORAL_TABLET | Freq: Every day | ORAL | 1 refills | Status: AC
  Filled 2023-08-09 – 2023-09-03 (×3): qty 30, 30d supply, fill #0

## 2023-08-09 NOTE — Telephone Encounter (Signed)
Called & spoke to the patient. Verified name & DOB. Patient was scheduled by [facility] to be seen for an appointment on 08/14/2023 with Dr.Giramata. Patient expressed verbal understanding.

## 2023-08-10 ENCOUNTER — Other Ambulatory Visit (HOSPITAL_COMMUNITY): Payer: Self-pay

## 2023-08-11 ENCOUNTER — Other Ambulatory Visit (HOSPITAL_COMMUNITY): Payer: Self-pay

## 2023-08-13 ENCOUNTER — Other Ambulatory Visit: Payer: Self-pay

## 2023-08-13 ENCOUNTER — Other Ambulatory Visit (HOSPITAL_COMMUNITY): Payer: Self-pay

## 2023-08-14 ENCOUNTER — Ambulatory Visit: Payer: MEDICAID | Admitting: Family

## 2023-08-17 ENCOUNTER — Telehealth: Payer: MEDICAID | Admitting: Physician Assistant

## 2023-08-17 DIAGNOSIS — F410 Panic disorder [episodic paroxysmal anxiety] without agoraphobia: Secondary | ICD-10-CM

## 2023-08-17 NOTE — Progress Notes (Signed)
Because of severe anxiety and panic attack, which we cannot evaluate or manage via e-visit, I feel your condition warrants further evaluation and I recommend that you be seen in a face to face visit.   NOTE: There will be NO CHARGE for this eVisit   If you are having a true medical emergency please call 911.      For an urgent face to face visit, [facility] has eight urgent care centers for your convenience:   NEW!! [facility] Health Urgent Care Center at [facility] Get Driving Directions [phone] [address] [place], [address]    [facility] Health Urgent Care Center at [facility] Get Driving Directions [phone] [address] [place], [place] [address]   [facility] Health Urgent Care Center [facility]) Get Driving Directions [phone] [address] [place], [place] [address]  [facility] Center [facility]) Get Driving Directions [phone] [address] [place],  [place]  [address]  [facility] Health Urgent Care Center [facility] - at [employer]  [phone] [place],  [place] [address]   [facility] Health Urgent Care at [facility] Get Driving Directions [phone] [address] [place], [place] [address]   [facility] Urgent Care at [facility] Get Driving Directions  [phone] [address] [place], [place] [address]   [facility] Health Urgent Care at [facility] Directions [phone] [address] [place], [place] [address]  Your MyChart E-visit questionnaire answers were reviewed by a board certified advanced clinical practitioner to complete your personal care plan based on your specific symptoms.  Thank you for using e-Visits.

## 2023-08-23 ENCOUNTER — Other Ambulatory Visit: Payer: Self-pay

## 2023-08-30 ENCOUNTER — Other Ambulatory Visit (HOSPITAL_COMMUNITY): Payer: Self-pay

## 2023-08-30 MED ORDER — ARIPIPRAZOLE 10 MG PO TABS
10.0000 mg | ORAL_TABLET | Freq: Every day | ORAL | 1 refills | Status: AC
  Filled 2023-08-30 – 2023-09-06 (×2): qty 30, 30d supply, fill #0

## 2023-08-31 ENCOUNTER — Other Ambulatory Visit (HOSPITAL_COMMUNITY): Payer: Self-pay

## 2023-08-31 MED ORDER — TRAZODONE HCL 50 MG PO TABS
50.0000 mg | ORAL_TABLET | Freq: Every day | ORAL | 0 refills | Status: AC
  Filled 2023-08-31: qty 30, 30d supply, fill #0

## 2023-09-03 ENCOUNTER — Other Ambulatory Visit: Payer: Self-pay

## 2023-09-03 ENCOUNTER — Other Ambulatory Visit (HOSPITAL_COMMUNITY): Payer: Self-pay

## 2023-09-06 ENCOUNTER — Other Ambulatory Visit (HOSPITAL_COMMUNITY): Payer: Self-pay

## 2023-09-06 ENCOUNTER — Other Ambulatory Visit: Payer: Self-pay

## 2023-09-06 MED ORDER — BUPROPION HCL ER (SR) 150 MG PO TB12
ORAL_TABLET | ORAL | 0 refills | Status: AC
  Filled 2023-09-06 – 2023-10-19 (×3): qty 60, 34d supply, fill #0

## 2023-09-06 MED ORDER — ATOMOXETINE HCL 80 MG PO CAPS
80.0000 mg | ORAL_CAPSULE | Freq: Every day | ORAL | 1 refills | Status: AC
  Filled 2023-09-06: qty 90, 90d supply, fill #0
  Filled 2023-10-19: qty 30, 30d supply, fill #0

## 2023-09-08 ENCOUNTER — Other Ambulatory Visit (HOSPITAL_COMMUNITY): Payer: Self-pay

## 2023-09-16 ENCOUNTER — Telehealth: Payer: MEDICAID | Admitting: Physician Assistant

## 2023-09-16 ENCOUNTER — Other Ambulatory Visit: Payer: Self-pay

## 2023-09-16 ENCOUNTER — Other Ambulatory Visit (HOSPITAL_COMMUNITY): Payer: Self-pay

## 2023-09-16 DIAGNOSIS — R3989 Other symptoms and signs involving the genitourinary system: Secondary | ICD-10-CM | POA: Diagnosis not present

## 2023-09-16 MED ORDER — CEPHALEXIN 500 MG PO CAPS
500.0000 mg | ORAL_CAPSULE | Freq: Two times a day (BID) | ORAL | 0 refills | Status: AC
  Filled 2023-09-16: qty 14, 7d supply, fill #0

## 2023-09-16 NOTE — Patient Instructions (Signed)
Dianne Grenier, thank you for joining Piedad Climes, PA-C for today's virtual visit.  While this provider is not your primary care provider (PCP), if your PCP is located in our provider database this encounter information will be shared with them immediately following your visit.   A [facility] MyChart account gives you access to today's visit and all your visits, tests, and labs performed at [facility] " click here if you don't have a [facility] MyChart account or go to [email or web address]  Consent: (Patient) [name] provided verbal consent for this virtual visit at the beginning of the encounter.  Current Medications:  Current Outpatient Medications:    amLODipine (NORVASC) 10 MG tablet, Take 1 tablet (10 mg total) by mouth daily., Disp: 90 tablet, Rfl: 1   ARIPiprazole (ABILIFY) 10 MG tablet, Take 1 tablet (10 mg total) by mouth daily for mood, Disp: 30 tablet, Rfl: 1   atomoxetine (STRATTERA) 40 MG capsule, Take 1 capsule (40 mg total) by mouth daily for 1 week, then increase to 2 capsules daily., Disp: 60 capsule, Rfl: 0   atomoxetine (STRATTERA) 80 MG capsule, Take 1 capsule (80 mg total) by mouth daily., Disp: 90 capsule, Rfl: 1   atorvastatin (LIPITOR) 20 MG tablet, Take 1 tablet (20 mg total) by mouth at bedtime., Disp: 30 tablet, Rfl: 0   buPROPion (WELLBUTRIN SR) 150 MG 12 hr tablet, Take 1 tablet (150 mg total) by mouth daily for 7 days, THEN 1 tablet (150 mg total) 2 (two) times daily for focus and mood, Disp: 60 tablet, Rfl: 0   busPIRone (BUSPAR) 10 MG tablet, Take 1 tablet (10 mg total) by mouth 3 (three) times daily., Disp: 90 tablet, Rfl: 0   diclofenac Sodium (VOLTAREN ARTHRITIS PAIN) 1 % GEL, Apply 2 g topically 4 (four) times daily., Disp: 350 g, Rfl: 1   hydrochlorothiazide (HYDRODIURIL) 25 MG tablet, Take 1 tablet (25 mg total) by mouth daily., Disp: 90 tablet, Rfl: 1   ibuprofen (ADVIL) 800 MG tablet, Take 1 tablet (800 mg total) by  mouth every 8 (eight) hours as needed., Disp: 30 tablet, Rfl: 0   lisinopril (ZESTRIL) 20 MG tablet, Take 1 tablet (20 mg total) by mouth daily., Disp: 90 tablet, Rfl: 1   omeprazole (PRILOSEC) 40 MG capsule, Take 1 capsule (40 mg total) by mouth daily., Disp: 90 capsule, Rfl: 2   sertraline (ZOLOFT) 100 MG tablet, Take 1 tablet (100 mg total) by mouth daily for mood., Disp: 30 tablet, Rfl: 1   sertraline (ZOLOFT) 50 MG tablet, Take 1 tablet (50 mg total) by mouth daily., Disp: 30 tablet, Rfl: 0   traZODone (DESYREL) 50 MG tablet, Take 1 tablet (50 mg total) by mouth at bedtime., Disp: 30 tablet, Rfl: 2   traZODone (DESYREL) 50 MG tablet, Take 1 tablet (50 mg total) by mouth daily for sleep, Disp: 30 tablet, Rfl: 0   Medications ordered in this encounter:  No orders of the defined types were placed in this encounter.    *If you need refills on other medications prior to your next appointment, please contact your pharmacy*  Follow-Up: Call back or seek an in-person evaluation if the symptoms worsen or if the condition fails to improve as anticipated.  [facility] [phone]  Other Instructions Your symptoms are consistent with a bladder infection, also called acute cystitis. Please take your antibiotic (Keflex) as directed until all pills are gone.  Stay very well hydrated.  Consider a daily probiotic (  Align, Culturelle, or Activia) to help prevent stomach upset caused by the antibiotic.  Taking a probiotic daily may also help prevent recurrent UTIs.  Also consider taking AZO (Phenazopyridine) tablets to help decrease pain with urination.    Urinary Tract Infection A urinary tract infection (UTI) can occur any place along the urinary tract. The tract includes the kidneys, ureters, bladder, and urethra. A type of germ called bacteria often causes a UTI. UTIs are often helped with antibiotic medicine.  HOME CARE  If given, take antibiotics as told by your doctor. Finish them  even if you start to feel better. Drink enough fluids to keep your pee (urine) clear or pale yellow. Avoid tea, drinks with caffeine, and bubbly (carbonated) drinks. Pee often. Avoid holding your pee in for a long time. Pee before and after having sex (intercourse). Wipe from front to back after you poop (bowel movement) if you are a woman. Use each tissue only once. GET HELP RIGHT AWAY IF:  You have back pain. You have lower belly (abdominal) pain. You have chills. You feel sick to your stomach (nauseous). You throw up (vomit). Your burning or discomfort with peeing does not go away. You have a fever. Your symptoms are not better in 3 days. MAKE SURE YOU:  Understand these instructions. Will watch your condition. Will get help right away if you are not doing well or get worse. Document Released: 04/27/2008 Document Revised: 08/03/2012 Document Reviewed: 06/09/2012 [facility] Patient Information 2015 [place], [place]. This information is not intended to replace advice given to you by your health care provider. Make sure you discuss any questions you have with your health care provider.    If you have been instructed to have an in-person evaluation today at a local Urgent Care facility, please use the link below. It will take you to a list of all of our available [facility], including address, phone number and hours of operation. Please do not delay care.  [facility]  If you or a family member do not have a primary care provider, use the link below to schedule a visit and establish care. When you choose a [facility] primary care physician or advanced practice provider, you gain a long-term partner in health. Find a Primary Care Provider  Learn more about [facility]'s in-office and virtual care options: [facility] - Get Care Now

## 2023-09-16 NOTE — Progress Notes (Signed)
Virtual Visit Consent   [name], you are scheduled for a virtual visit with a [facility] provider today. Just as with appointments in the office, your consent must be obtained to participate. Your consent will be active for this visit and any virtual visit you may have with one of our providers in the next 365 days. If you have a MyChart account, a copy of this consent can be sent to you electronically.  As this is a virtual visit, video technology does not allow for your provider to perform a traditional examination. This may limit your provider's ability to fully assess your condition. If your provider identifies any concerns that need to be evaluated in person or the need to arrange testing (such as labs, EKG, etc.), we will make arrangements to do so. Although advances in technology are sophisticated, we cannot ensure that it will always work on either your end or our end. If the connection with a video visit is poor, the visit may have to be switched to a telephone visit. With either a video or telephone visit, we are not always able to ensure that we have a secure connection.  By engaging in this virtual visit, you consent to the provision of healthcare and authorize for your insurance to be billed (if applicable) for the services provided during this visit. Depending on your insurance coverage, you may receive a charge related to this service.  I need to obtain your verbal consent now. Are you willing to proceed with your visit today? [name] has provided verbal consent on 09/16/2023 for a virtual visit (video or telephone). Piedad Climes, [place]  Date: 09/16/2023 6:53 PM  Virtual Visit via Video Note   I, Piedad Climes, connected with  [name]  ([id number], 12-May-1976) on 09/16/23 at  7:00 PM EDT by a video-enabled telemedicine application and verified that I am speaking with the correct person using two identifiers.  Location: Patient: Virtual Visit  Location Patient: Home Provider: Virtual Visit Location Provider: Home Office   I discussed the limitations of evaluation and management by telemedicine and the availability of in person appointments. The patient expressed understanding and agreed to proceed.    History of Present Illness: [name] is a 47 y.o. who identifies as a female who was assigned female at birth, and is being seen today for possible UTI. Endorses symptoms starting after intercourse 3 days ago. The next day noting urinary urgency, frequency, dysuria. Denies fever, chills. Now with suprapubic pressure. Denies fever, chills. LMP 2 weeks ago. Denies concern for pregnancy or STI.  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   Severe alcohol use disorder (HCC) 07/01/2020   Major depressive disorder, recurrent severe without psychotic features (HCC) 07/01/2020   Pica 05/21/2020   Prediabetes 05/21/2020   Alcohol use disorder, severe, dependence (HCC) 05/21/2020   Attention-deficit hyperactivity disorder, unspecified type 04/01/2020   Bipolar disorder, unspecified (HCC) 04/01/2020   Diarrhea 03/24/2020   Hypokalemia 03/24/2020   Iron deficiency anemia 03/24/2020   MDD (major depressive disorder), recurrent, severe, 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HCC)    Severe recurrent major depression without psychotic features (HCC) 03/22/2020   Adjustment disorder with mixed anxiety and depressed mood 01/10/2020   Polysubstance abuse (HCC) 01/10/2020   Marihuana  abuse 06/16/2019   Nicotine dependence, unspecified, uncomplicated 06/16/2019   Gastroesophageal reflux disease 03/08/2017   Anxiety 03/08/2017   Opioid dependence with withdrawal (HCC) 09/24/2016   Cocaine abuse (HCC) 09/24/2016    Serotonin syndrome 09/17/2016   Altered mental status 09/17/2016   Bicornuate uterus 09/17/2016   Benign essential HTN 09/17/2016   Anxiety and depression 10/10/2015   History of intravenous drug use in remission 09/07/2011   Essential hypertension    Bipolar disorder (HCC)    Bicornuate uterus     Allergies:  Allergies  Allergen Reactions   Hydrocodone-Acetaminophen Nausea Only and Other (See Comments)   Medications:  Current Outpatient Medications:    cephALEXin (KEFLEX) 500 MG capsule, Take 1 capsule (500 mg total) by mouth 2 (two) times daily for 7 days., Disp: 14 capsule, Rfl: 0   amLODipine (NORVASC) 10 MG tablet, Take 1 tablet (10 mg total) by mouth daily., Disp: 90 tablet, Rfl: 1   ARIPiprazole (ABILIFY) 10 MG tablet, Take 1 tablet (10 mg total) by mouth daily for mood, Disp: 30 tablet, Rfl: 1   atomoxetine (STRATTERA) 80 MG capsule, Take 1 capsule (80 mg total) by mouth daily., Disp: 90 capsule, Rfl: 1   atorvastatin (LIPITOR) 20 MG tablet, Take 1 tablet (20 mg total) by mouth at bedtime., Disp: 30 tablet, Rfl: 0   buPROPion (WELLBUTRIN SR) 150 MG 12 hr tablet, Take 1 tablet (150 mg total) by mouth daily for 7 days, THEN 1 tablet (150 mg total) 2 (two) times daily for focus and mood, Disp: 60 tablet, Rfl: 0   busPIRone (BUSPAR) 10 MG tablet, Take 1 tablet (10 mg total) by mouth 3 (three) times daily., Disp: 90 tablet, Rfl: 0   diclofenac Sodium (VOLTAREN ARTHRITIS PAIN) 1 % GEL, Apply 2 g topically 4 (four) times daily., Disp: 350 g, Rfl: 1   hydrochlorothiazide (HYDRODIURIL) 25 MG tablet, Take 1 tablet (25 mg total) by mouth daily., Disp: 90 tablet, Rfl: 1   ibuprofen (ADVIL) 800 MG tablet, Take 1 tablet (800 mg total) by mouth every 8 (eight) hours as needed., Disp: 30 tablet, Rfl: 0   lisinopril (ZESTRIL) 20 MG tablet, Take 1 tablet (20 mg total) by mouth daily., Disp: 90 tablet, Rfl: 1   omeprazole (PRILOSEC) 40 MG capsule, Take 1 capsule (40 mg total) by mouth daily.,  Disp: 90 capsule, Rfl: 2   sertraline (ZOLOFT) 100 MG tablet, Take 1 tablet (100 mg total) by mouth daily for mood., Disp: 30 tablet, Rfl: 1   sertraline (ZOLOFT) 50 MG tablet, Take 1 tablet (50 mg total) by mouth daily., Disp: 30 tablet, Rfl: 0   traZODone (DESYREL) 50 MG tablet, Take 1 tablet (50 mg total) by mouth at bedtime., Disp: 30 tablet, Rfl: 2   traZODone (DESYREL) 50 MG tablet, Take 1 tablet (50 mg total) by mouth daily for sleep, Disp: 30 tablet, Rfl: 0  Observations/Objective: Patient is well-developed, well-nourished in no acute distress.  Resting comfortably at home.  Head is normocephalic, atraumatic.  No labored breathing. Speech is clear and coherent with logical content.  Patient is alert and oriented at baseline.   Assessment and Plan: 1. Suspected UTI - cephALEXin (KEFLEX) 500 MG capsule; Take 1 capsule (500 mg total) by mouth 2 (two) times daily for 7 days.  Dispense: 14 capsule; Refill: 0  Classic UTI symptoms with absence of alarm signs or symptoms. Prior history of UTI. Will treat empirically with Keflex for suspected uncomplicated cystitis. Supportive measures and OTC medications reviewed.  Strict in-person evaluation precautions discussed.    Follow Up Instructions: I discussed the assessment and treatment plan with the patient. The patient was provided an opportunity to ask questions and all were answered. The patient agreed with the plan and demonstrated an understanding of the instructions.  A copy of instructions were sent to the patient via MyChart unless otherwise noted below.   The patient was advised to call back or seek an in-person evaluation if the symptoms worsen or if the condition fails to improve as anticipated.    Piedad Climes, PA-C

## 2023-09-17 ENCOUNTER — Other Ambulatory Visit: Payer: Self-pay

## 2023-09-24 ENCOUNTER — Ambulatory Visit: Payer: MEDICAID | Attending: Internal Medicine

## 2023-09-24 DIAGNOSIS — Z111 Encounter for screening for respiratory tuberculosis: Secondary | ICD-10-CM

## 2023-09-24 DIAGNOSIS — Z23 Encounter for immunization: Secondary | ICD-10-CM

## 2023-09-24 NOTE — Progress Notes (Signed)
PPD Placement note [name], 47 y.o. female is here today for placement of PPD test Reason for PPD test: work Pt taken PPD test before: yes Has the patient been in recent contact with anyone known or suspected of having active TB disease?: no       Patient's Country of origin?: [place] O: Alert and oriented in NAD. P:  PPD placed on 09/24/2023.  Patient advised to return for reading within 48-72 hours.

## 2023-09-27 ENCOUNTER — Ambulatory Visit: Payer: MEDICAID

## 2023-09-28 ENCOUNTER — Other Ambulatory Visit: Payer: Self-pay

## 2023-10-13 ENCOUNTER — Other Ambulatory Visit: Payer: Self-pay

## 2023-10-18 ENCOUNTER — Other Ambulatory Visit: Payer: Self-pay

## 2023-10-18 ENCOUNTER — Ambulatory Visit: Payer: Self-pay

## 2023-10-18 ENCOUNTER — Telehealth: Payer: MEDICAID | Admitting: Physician Assistant

## 2023-10-18 ENCOUNTER — Telehealth: Payer: Self-pay | Admitting: *Deleted

## 2023-10-18 DIAGNOSIS — B9689 Other specified bacterial agents as the cause of diseases classified elsewhere: Secondary | ICD-10-CM | POA: Diagnosis not present

## 2023-10-18 DIAGNOSIS — K219 Gastro-esophageal reflux disease without esophagitis: Secondary | ICD-10-CM | POA: Diagnosis not present

## 2023-10-18 DIAGNOSIS — N760 Acute vaginitis: Secondary | ICD-10-CM | POA: Diagnosis not present

## 2023-10-18 MED ORDER — NALTREXONE HCL 50 MG PO TABS
50.0000 mg | ORAL_TABLET | Freq: Every day | ORAL | 0 refills | Status: AC
  Filled 2023-10-18 – 2023-10-19 (×2): qty 30, 30d supply, fill #0

## 2023-10-18 MED ORDER — ARIPIPRAZOLE 10 MG PO TABS
10.0000 mg | ORAL_TABLET | Freq: Every day | ORAL | 1 refills | Status: AC
  Filled 2023-10-18 – 2023-10-19 (×4): qty 90, 90d supply, fill #0

## 2023-10-18 MED ORDER — METRONIDAZOLE 500 MG PO TABS
500.0000 mg | ORAL_TABLET | Freq: Two times a day (BID) | ORAL | 0 refills | Status: AC
  Filled 2023-10-19: qty 14, 7d supply, fill #0

## 2023-10-18 MED ORDER — OMEPRAZOLE 40 MG PO CPDR
40.0000 mg | DELAYED_RELEASE_CAPSULE | Freq: Every day | ORAL | 0 refills | Status: AC
  Filled 2023-10-20: qty 30, 30d supply, fill #0

## 2023-10-18 MED ORDER — SERTRALINE HCL 100 MG PO TABS
100.0000 mg | ORAL_TABLET | Freq: Every day | ORAL | 1 refills | Status: AC
  Filled 2023-10-18 – 2023-10-19 (×2): qty 90, 90d supply, fill #0

## 2023-10-18 NOTE — Telephone Encounter (Signed)
Summary: Rx reroute request   Pt called requesting to have her prescriptions for flagyl and omeprazole sent to the [facility]  Best contact: [phone]

## 2023-10-18 NOTE — Telephone Encounter (Signed)
Spoke with pt at 1736. States she is coming by practice to pick up medication list, aware [facility] pharmacy is working on medications. Read note of Cassandra's from 5:04

## 2023-10-18 NOTE — Patient Instructions (Signed)
[name], thank you for joining Debbie Loveless, PA-C for today's virtual visit.  While this provider is not your primary care provider (PCP), if your PCP is located in our provider database this encounter information will be shared with them immediately following your visit.   [name] MyChart account gives you access to today's visit and all your visits, tests, and labs performed at [facility] " click here if you don't have a [name] MyChart account or go to [email or web address]  Consent: (Patient) Verneal Edwards provided verbal consent for this virtual visit at the beginning of the encounter.  Current Medications:  Current Outpatient Medications:    metroNIDAZOLE (FLAGYL) 500 MG tablet, Take 1 tablet (500 mg total) by mouth 2 (two) times daily for 7 days., Disp: 14 tablet, Rfl: 0   amLODipine (NORVASC) 10 MG tablet, Take 1 tablet (10 mg total) by mouth daily., Disp: 90 tablet, Rfl: 1   ARIPiprazole (ABILIFY) 10 MG tablet, Take 1 tablet (10 mg total) by mouth daily for mood, Disp: 30 tablet, Rfl: 1   atomoxetine (STRATTERA) 80 MG capsule, Take 1 capsule (80 mg total) by mouth daily., Disp: 90 capsule, Rfl: 1   buPROPion (WELLBUTRIN SR) 150 MG 12 hr tablet, Take 1 tablet (150 mg total) by mouth daily for 7 days, THEN 1 tablet (150 mg total) 2 (two) times daily for focus and mood, Disp: 60 tablet, Rfl: 0   diclofenac Sodium (VOLTAREN ARTHRITIS PAIN) 1 % GEL, Apply 2 g topically 4 (four) times daily., Disp: 350 g, Rfl: 1   hydrochlorothiazide (HYDRODIURIL) 25 MG tablet, Take 1 tablet (25 mg total) by mouth daily., Disp: 90 tablet, Rfl: 1   ibuprofen (ADVIL) 800 MG tablet, Take 1 tablet (800 mg total) by mouth every 8 (eight) hours as needed., Disp: 30 tablet, Rfl: 0   lisinopril (ZESTRIL) 20 MG tablet, Take 1 tablet (20 mg total) by mouth daily., Disp: 90 tablet, Rfl: 1   omeprazole (PRILOSEC) 40 MG capsule, Take 1 capsule (40 mg total) by mouth daily.,  Disp: 90 capsule, Rfl: 0   sertraline (ZOLOFT) 100 MG tablet, Take 1 tablet (100 mg total) by mouth daily for mood., Disp: 30 tablet, Rfl: 1   sertraline (ZOLOFT) 50 MG tablet, Take 1 tablet (50 mg total) by mouth daily., Disp: 30 tablet, Rfl: 0   traZODone (DESYREL) 50 MG tablet, Take 1 tablet (50 mg total) by mouth at bedtime., Disp: 30 tablet, Rfl: 2   traZODone (DESYREL) 50 MG tablet, Take 1 tablet (50 mg total) by mouth daily for sleep, Disp: 30 tablet, Rfl: 0   Medications ordered in this encounter:  Meds ordered this encounter  Medications   metroNIDAZOLE (FLAGYL) 500 MG tablet    Sig: Take 1 tablet (500 mg total) by mouth 2 (two) times daily for 7 days.    Dispense:  14 tablet    Refill:  0    Order Specific Question:   Supervising Provider    Answer:   Merrilee Jansky [[id number]]   omeprazole (PRILOSEC) 40 MG capsule    Sig: Take 1 capsule (40 mg total) by mouth daily.    Dispense:  90 capsule    Refill:  0    Order Specific Question:   Supervising Provider    Answer:   Merrilee Jansky [[id number]]     *If you need refills on other medications prior to your next appointment, please contact your pharmacy*  Follow-Up: Call back or seek an  in-person evaluation if the symptoms worsen or if the condition fails to improve as anticipated.  [facility] [phone]  Other Instructions Bacterial Vaginosis  Bacterial vaginosis is an infection that occurs when the normal balance of bacteria in the vagina changes. This change is caused by an overgrowth of certain bacteria in the vagina. Bacterial vaginosis is the most common vaginal infection among females aged 44 to 76 years. This condition increases the risk of sexually transmitted infections (STIs). Treatment can help reduce this risk. Treatment is very important for pregnant women because this condition can cause babies to be born early (prematurely) or at a low birth weight. What are the causes? This condition is  caused by an increase in harmful bacteria that are normally present in small amounts in the vagina. However, the exact reason this condition develops is not known. You cannot get bacterial vaginosis from toilet seats, bedding, swimming pools, or contact with objects around you. What increases the risk? The following factors may make you more likely to develop this condition: Having a new sexual partner or multiple sexual partners, or having unprotected sex. Douching. Having an intrauterine device (IUD). Smoking. Abusing drugs and alcohol. This may lead to riskier sexual behavior. Taking certain antibiotic medicines. Being pregnant. What are the signs or symptoms? Some women with this condition have no symptoms. Symptoms may include: [name] or white vaginal discharge. The discharge can be watery or foamy. A fish-like odor with discharge, especially after sex or during menstruation. Itching in and around the vagina. Burning or pain with urination. How is this diagnosed? This condition is diagnosed based on: Your medical history. A physical exam of the vagina. Checking a sample of vaginal fluid for harmful bacteria or abnormal cells. How is this treated? This condition is treated with antibiotic medicines. These may be given as a pill, a vaginal cream, or a medicine that is put into the vagina (suppository). If the condition comes back after treatment, a second round of antibiotics may be needed. Follow these instructions at home: Medicines Take or apply over-the-counter and prescription medicines only as told by your health care provider. Take or apply your antibiotic medicine as told by your health care provider. Do not stop using the antibiotic even if you start to feel better. General instructions If you have a female sexual partner, tell her that you have a vaginal infection. She should follow up with her health care provider. If you have a female sexual partner, he does not need  treatment. Avoid sexual activity until you finish treatment. Drink enough fluid to keep your urine pale yellow. Keep the area around your vagina and rectum clean. Wash the area daily with warm water. Wipe yourself from front to back after using the toilet. If you are breastfeeding, talk to your health care provider about continuing breastfeeding during treatment. Keep all follow-up visits. This is important. How is this prevented? Self-care Do not douche. Wash the outside of your vagina with warm water only. Wear cotton or cotton-lined underwear. Avoid wearing tight pants and pantyhose, especially during the summer. Safe sex Use protection when having sex. This includes: Using condoms. Using dental dams. This is a thin layer of a material made of latex or polyurethane that protects the mouth during oral sex. Limit the number of sexual partners. To help prevent bacterial vaginosis, it is best to have sex with just one partner (monogamous relationship). Make sure you and your sexual partner are tested for STIs. Drugs and alcohol  Do not use any products that contain nicotine or tobacco. These products include cigarettes, chewing tobacco, and vaping devices, such as e-cigarettes. If you need help quitting, ask your health care provider. Do not use drugs. Do not drink alcohol if: Your health care provider tells you not to do this. You are pregnant, may be pregnant, or are planning to become pregnant. If you drink alcohol: Limit how much you have to 0-1 drink a day. Be aware of how much alcohol is in your drink. In the [place]., one drink equals one 12 oz bottle of beer (355 mL), one 5 oz glass of wine (148 mL), or one 1 oz glass of hard liquor (44 mL). Where to find more information Centers for Disease Control and Prevention: FootballExhibition.com.br American Sexual Health Association (ASHA): www.ashastd.org U.S. Department of Health and Health and safety inspector, Office on Women's Health:  http://hoffman.com/ Contact a health care provider if: Your symptoms do not improve, even after treatment. You have more discharge or pain when urinating. You have a fever or chills. You have pain in your abdomen or pelvis. You have pain during sex. You have vaginal bleeding between menstrual periods. Summary Bacterial vaginosis is a vaginal infection that occurs when the normal balance of bacteria in the vagina changes. It results from an overgrowth of certain bacteria. This condition increases the risk of sexually transmitted infections (STIs). Getting treated can help reduce this risk. Treatment is very important for pregnant women because this condition can cause babies to be born early (prematurely) or at low birth weight. This condition is treated with antibiotic medicines. These may be given as a pill, a vaginal cream, or a medicine that is put into the vagina (suppository). This information is not intended to replace advice given to you by your health care provider. Make sure you discuss any questions you have with your health care provider. Document Revised: 05/09/2020 Document Reviewed: 05/09/2020 Elsevier Patient Education  2024 Elsevier Inc.    If you have been instructed to have an in-person evaluation today at a local Urgent Care facility, please use the link below. It will take you to a list of all of our available [facility], including address, phone number and hours of operation. Please do not delay care.  [facility]  If you or a family member do not have a primary care provider, use the link below to schedule a visit and establish care. When you choose a [facility] primary care physician or advanced practice provider, you gain a long-term partner in health. Find a Primary Care Provider  Learn more about [facility]'s in-office and virtual care options: [facility] - Get Care Now

## 2023-10-18 NOTE — Telephone Encounter (Addendum)
Spoke with patient . Patient is requesting that all medication be sent to [facility] pharmacy from [id number] - [place], [place] - [address] [place] [address] Phone: [phone]    [facility] with [place] . She said they are already working on it.  Patient is coming by the office tomorrow to pick of medication list.

## 2023-10-18 NOTE — Telephone Encounter (Signed)
  Chief Complaint: Medication Profile Symptoms: NA Frequency: NA Pertinent Negatives: Patient denies NA Disposition: [] ED /[] Urgent Care (no appt availability in office) / [] Appointment(In office/virtual)/ []  [facility]/ [] Home Care/ [] Refused Recommended Disposition /[] [facility]/ [x]  Follow-up with PCP Additional Notes:   Pt calling, states she is "Trying to check in to [facility] and they will not let me until you FAX current med profile, a script for Prilosec and Zoloft." States also needs refill of Keflex, not on current med profile.   States intake is over at 1100 but they "Will wait."  States "Please send as soon as they return from lunch."  Pt has VV scheduled but  states "They need this so I can check in." Contact Mio, LPN at [phone]  Teams message sent to [facility] as well.

## 2023-10-18 NOTE — Progress Notes (Signed)
Virtual Visit Consent   [name], you are scheduled for a virtual visit with a [place] provider today. Just as with appointments in the office, your consent must be obtained to participate. Your consent will be active for this visit and any virtual visit you may have with one of our providers in the next 365 days. If you have a MyChart account, a copy of this consent can be sent to you electronically.  As this is a virtual visit, video technology does not allow for your provider to perform a traditional examination. This may limit your provider's ability to fully assess your condition. If your provider identifies any concerns that need to be evaluated in person or the need to arrange testing (such as labs, EKG, etc.), we will make arrangements to do so. Although advances in technology are sophisticated, we cannot ensure that it will always work on either your end or our end. If the connection with a video visit is poor, the visit may have to be switched to a telephone visit. With either a video or telephone visit, we are not always able to ensure that we have a secure connection.  By engaging in this virtual visit, you consent to the provision of healthcare and authorize for your insurance to be billed (if applicable) for the services provided during this visit. Depending on your insurance coverage, you may receive a charge related to this service.  I need to obtain your verbal consent now. Are you willing to proceed with your visit today? [name] has provided verbal consent on 10/18/2023 for a virtual visit (video or telephone). [name], PA-C  Date: 10/18/2023 4:02 PM  Virtual Visit via Video Note   I, [name], connected with  Ashna Sandvik  ([id number], 04/06/1976) on 10/18/23 at  4:00 PM EST by a video-enabled telemedicine application and verified that I am speaking with the correct person using two identifiers.  Location: Patient: Virtual Visit  Location Patient: Home Provider: Virtual Visit Location Provider: Home Office   I discussed the limitations of evaluation and management by telemedicine and the availability of in person appointments. The patient expressed understanding and agreed to proceed.    History of Present Illness: [name] is a 47 y.o. who identifies as a female who was assigned female at birth, and is being seen today for vaginal discharge.  HPI: Vaginal Discharge The patient's primary symptoms include genital itching, a genital odor and vaginal discharge. This is a new problem. The current episode started in the past 7 days. The problem occurs constantly. The problem has been gradually worsening. The patient is experiencing no pain. She is not pregnant. Pertinent negatives include no abdominal pain, back pain, dysuria, fever, flank pain or nausea. The vaginal discharge was white, thick and malodorous. There has been no bleeding. She has not been passing clots. She has not been passing tissue. Nothing aggravates the symptoms. She has tried nothing for the symptoms. The treatment provided no relief. She is sexually active. It is unknown whether or not her partner has an STD.     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   Severe alcohol use disorder (HCC) 07/01/2020   Major depressive disorder, recurrent severe without psychotic features (HCC) 07/01/2020   Pica 05/21/2020   Prediabetes 05/21/2020  Alcohol use disorder, severe, dependence (HCC) 05/21/2020   Attention-deficit hyperactivity disorder, unspecified type 04/01/2020   Bipolar disorder, unspecified (HCC) 04/01/2020   Diarrhea 03/24/2020   Hypokalemia 03/24/2020   Iron deficiency anemia 03/24/2020   MDD (major depressive disorder), recurrent, severe,  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HCC)    Severe recurrent major depression without psychotic features (HCC) 03/22/2020   Adjustment disorder with mixed anxiety and depressed mood 01/10/2020   Polysubstance abuse (HCC) 01/10/2020   Marihuana abuse 06/16/2019   Nicotine dependence, unspecified, uncomplicated 06/16/2019   Gastroesophageal reflux disease 03/08/2017   Anxiety 03/08/2017   Opioid dependence with withdrawal (HCC) 09/24/2016   Cocaine abuse (HCC) 09/24/2016   Serotonin syndrome 09/17/2016   Altered mental status 09/17/2016   Bicornuate uterus 09/17/2016   Benign essential HTN 09/17/2016   Anxiety and depression 10/10/2015   History of intravenous drug use in remission 09/07/2011   Essential hypertension    Bipolar disorder (HCC)    Bicornuate uterus     Allergies:  Allergies  Allergen Reactions   Hydrocodone-Acetaminophen Nausea Only and Other (See Comments)   Medications:  Current Outpatient Medications:    metroNIDAZOLE (FLAGYL) 500 MG tablet, Take 1 tablet (500 mg total) by mouth 2 (two) times daily for 7 days., Disp: 14 tablet, Rfl: 0   amLODipine (NORVASC) 10 MG tablet, Take 1 tablet (10 mg total) by mouth daily., Disp: 90 tablet, Rfl: 1   ARIPiprazole (ABILIFY) 10 MG tablet, Take 1 tablet (10 mg total) by mouth daily for mood, Disp: 30 tablet, Rfl: 1   atomoxetine (STRATTERA) 80 MG capsule, Take 1 capsule (80 mg total) by mouth daily., Disp: 90 capsule, Rfl: 1   buPROPion (WELLBUTRIN SR) 150 MG 12 hr tablet, Take 1 tablet (150 mg total) by mouth daily for 7 days, THEN 1 tablet (150 mg total) 2 (two) times daily for focus and mood, Disp: 60 tablet, Rfl: 0   diclofenac Sodium (VOLTAREN ARTHRITIS PAIN) 1 % GEL, Apply 2 g topically 4 (four) times daily., Disp: 350 g, Rfl: 1   hydrochlorothiazide (HYDRODIURIL) 25 MG tablet, Take 1 tablet (25 mg total) by mouth daily., Disp: 90 tablet, Rfl: 1   ibuprofen (ADVIL) 800 MG tablet, Take 1 tablet  (800 mg total) by mouth every 8 (eight) hours as needed., Disp: 30 tablet, Rfl: 0   lisinopril (ZESTRIL) 20 MG tablet, Take 1 tablet (20 mg total) by mouth daily., Disp: 90 tablet, Rfl: 1   omeprazole (PRILOSEC) 40 MG capsule, Take 1 capsule (40 mg total) by mouth daily., Disp: 90 capsule, Rfl: 0   sertraline (ZOLOFT) 100 MG tablet, Take 1 tablet (100 mg total) by mouth daily for mood., Disp: 30 tablet, Rfl: 1   sertraline (ZOLOFT) 50 MG tablet, Take 1 tablet (50 mg total) by mouth daily., Disp: 30 tablet, Rfl: 0   traZODone (DESYREL) 50 MG tablet, Take 1 tablet (50 mg total) by mouth at bedtime., Disp: 30 tablet, Rfl: 2   traZODone (DESYREL) 50 MG tablet, Take 1 tablet (50 mg total) by mouth daily for sleep, Disp: 30 tablet, Rfl: 0  Observations/Objective: Patient is well-developed, well-nourished in no acute distress.  Resting comfortably at home.  Head is normocephalic, atraumatic.  No labored breathing.  Speech is clear and coherent with logical content.  Patient is alert and oriented at baseline.    Assessment and Plan: 1. BV (bacterial vaginosis) - metroNIDAZOLE (FLAGYL) 500 MG tablet; Take 1 tablet (500 mg total) by mouth 2 (  two) times daily for 7 days.  Dispense: 14 tablet; Refill: 0  2. GERD without esophagitis - omeprazole (PRILOSEC) 40 MG capsule; Take 1 capsule (40 mg total) by mouth daily.  Dispense: 90 capsule; Refill: 0  - Symptoms consistent with BV - Metronidazole prescribed - Limit bubble baths, scented lotions/soaps/detergents - Limit tight fitting clothing - Omeprazole refilled at patient request - Patient starting [facility] inpatient treatment center tomorrow; had planned to start today but needed a medication reconciliation form faxed from PCP office before they would admit her - Seek on person evaluation if not improving or if symptoms worsen   Follow Up Instructions: I discussed the assessment and treatment plan with the patient. The patient was provided an  opportunity to ask questions and all were answered. The patient agreed with the plan and demonstrated an understanding of the instructions.  A copy of instructions were sent to the patient via MyChart unless otherwise noted below.    The patient was advised to call back or seek an in-person evaluation if the symptoms worsen or if the condition fails to improve as anticipated.    [name], PA-C

## 2023-10-19 ENCOUNTER — Other Ambulatory Visit: Payer: Self-pay

## 2023-10-19 ENCOUNTER — Other Ambulatory Visit (HOSPITAL_BASED_OUTPATIENT_CLINIC_OR_DEPARTMENT_OTHER): Payer: Self-pay

## 2023-10-19 ENCOUNTER — Ambulatory Visit: Payer: Self-pay

## 2023-10-19 DIAGNOSIS — B9689 Other specified bacterial agents as the cause of diseases classified elsewhere: Secondary | ICD-10-CM

## 2023-10-19 MED ORDER — FLUCONAZOLE 150 MG PO TABS
150.0000 mg | ORAL_TABLET | Freq: Once | ORAL | 0 refills | Status: AC
  Filled 2023-10-19: qty 1, 1d supply, fill #0

## 2023-10-19 NOTE — Telephone Encounter (Signed)
Spoke with pharmacy Dahlia Client) Medication is ready for pick. Medication list pick up by  patient' daughter.

## 2023-10-19 NOTE — Telephone Encounter (Signed)
Reason for Disposition  Pharmacy calling with prescription question and triager answers question  Answer Assessment - Initial Assessment Questions 1. NAME of MEDICINE: "What medicine(s) are you calling about?"     Flagyl Diflucan 2. QUESTION: "What is your question?" (e.g., double dose of medicine, side effect)     Pt stated she asked for Diflucan and it was not ordered needs all meeds sent to [facility] pharmacy 3. PRESCRIBER: "Who prescribed the medicine?" Reason: if prescribed by specialist, call should be referred to that group.     PCP  Protocols used: Medication Question Call-A-AH Abx and Diflucan needs to be ordered and sent to community pharmacy.  Pt was very upset

## 2023-10-20 ENCOUNTER — Other Ambulatory Visit (HOSPITAL_BASED_OUTPATIENT_CLINIC_OR_DEPARTMENT_OTHER): Payer: Self-pay

## 2023-10-20 ENCOUNTER — Telehealth: Payer: Self-pay

## 2023-10-20 ENCOUNTER — Other Ambulatory Visit: Payer: Self-pay

## 2023-10-20 NOTE — Telephone Encounter (Signed)
Noted  

## 2023-10-20 NOTE — Telephone Encounter (Signed)
Copied from CRM [id number]. Topic: General - Other >> Oct 20, 2023 11:08 AM Phill Myron wrote: Attn: Cassandra first I would like to thank you for all you do. I am coming today to pick up my updated medication list, since my daughter was unable to do yesterday. Then I am heading over to [facility] for inpatient  treatment.  Again Thank you.

## 2024-02-09 ENCOUNTER — Other Ambulatory Visit (HOSPITAL_COMMUNITY): Payer: Self-pay

## 2024-02-09 ENCOUNTER — Other Ambulatory Visit: Payer: Self-pay

## 2024-02-09 MED ORDER — NALTREXONE HCL 50 MG PO TABS
50.0000 mg | ORAL_TABLET | Freq: Every day | ORAL | 0 refills | Status: AC
  Filled 2024-02-09 – 2024-02-21 (×3): qty 30, 30d supply, fill #0

## 2024-02-09 MED ORDER — ARIPIPRAZOLE 15 MG PO TABS
15.0000 mg | ORAL_TABLET | Freq: Every day | ORAL | 1 refills | Status: AC
  Filled 2024-02-09 – 2024-02-21 (×3): qty 90, 90d supply, fill #0

## 2024-02-21 ENCOUNTER — Other Ambulatory Visit: Payer: Self-pay

## 2024-02-21 ENCOUNTER — Other Ambulatory Visit (HOSPITAL_COMMUNITY): Payer: Self-pay

## 2024-02-24 ENCOUNTER — Other Ambulatory Visit: Payer: Self-pay

## 2024-02-25 ENCOUNTER — Other Ambulatory Visit: Payer: Self-pay

## 2024-02-25 MED ORDER — SERTRALINE HCL 100 MG PO TABS
100.0000 mg | ORAL_TABLET | Freq: Every day | ORAL | 1 refills | Status: AC
  Filled 2024-02-25 – 2024-03-13 (×2): qty 90, 90d supply, fill #0

## 2024-03-07 ENCOUNTER — Other Ambulatory Visit: Payer: Self-pay

## 2024-03-13 ENCOUNTER — Other Ambulatory Visit: Payer: Self-pay

## 2024-03-15 ENCOUNTER — Other Ambulatory Visit: Payer: Self-pay

## 2024-03-28 ENCOUNTER — Ambulatory Visit: Payer: MEDICAID | Admitting: Internal Medicine

## 2024-03-31 ENCOUNTER — Encounter (HOSPITAL_COMMUNITY): Payer: Self-pay

## 2024-04-07 ENCOUNTER — Ambulatory Visit: Payer: MEDICAID | Admitting: Internal Medicine

## 2024-05-09 ENCOUNTER — Telehealth: Payer: MEDICAID | Admitting: Physician Assistant

## 2024-05-09 DIAGNOSIS — G44229 Chronic tension-type headache, not intractable: Secondary | ICD-10-CM

## 2024-05-09 NOTE — Progress Notes (Signed)
 Virtual Visit Consent   [name], you are scheduled for a virtual visit with a [place] provider today. Just as with appointments in the office, your consent must be obtained to participate. Your consent will be active for this visit and any virtual visit you may have with one of our providers in the next 365 days. If you have a MyChart account, a copy of this consent can be sent to you electronically.  As this is a virtual visit, video technology does not allow for your provider to perform a traditional examination. This may limit your provider's ability to fully assess your condition. If your provider identifies any concerns that need to be evaluated in person or the need to arrange testing (such as labs, EKG, etc.), we will make arrangements to do so. Although advances in technology are sophisticated, we cannot ensure that it will always work on either your end or our end. If the connection with a video visit is poor, the visit may have to be switched to a telephone visit. With either a video or telephone visit, we are not always able to ensure that we have a secure connection.  By engaging in this virtual visit, you consent to the provision of healthcare and authorize for your insurance to be billed (if applicable) for the services provided during this visit. Depending on your insurance coverage, you may receive a charge related to this service.  I need to obtain your verbal consent now. Are you willing to proceed with your visit today? Doyne Belasco has provided verbal consent on 05/09/2024 for a virtual visit (video or telephone). Char Common Ward, PA-C  Date: 05/09/2024 5:18 PM   Virtual Visit via Video Note   I, Char Common Ward, connected with  [name]  ([id number], March 15, 1976) on 05/09/24 at  5:15 PM EDT by a video-enabled telemedicine application and verified that I am speaking with the correct person using two identifiers.  Location: Patient: Virtual Visit Location  Patient: Home Provider: Virtual Visit Location Provider: Home Office   I discussed the limitations of evaluation and management by telemedicine and the availability of in person appointments. The patient expressed understanding and agreed to proceed.    History of Present Illness: [name] is a 48 y.o. who identifies as a female who was assigned female at birth, and is being seen today for headache that started yesterday, reports it is located on the back of her head to the right side of her neck.  She reports she has a h/o migraines and today's headache feels like her typical headache.  Reports She has has been taking with ibuprofen  and tylenol  with some relief.  She is supposed to work [employer] and is requesting a work note. Aaron Aas   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   Severe alcohol use disorder (HCC) 07/01/2020   Major depressive disorder, recurrent severe without psychotic features (HCC) 07/01/2020   Pica 05/21/2020   Prediabetes 05/21/2020   Alcohol use disorder, severe, dependence (HCC) 05/21/2020   Attention-deficit hyperactivity disorder, unspecified type 04/01/2020   Bipolar disorder, unspecified (HCC) 04/01/2020   Diarrhea 03/24/2020   Hypokalemia 03/24/2020   Iron deficiency anemia 03/24/2020   MDD (major depressive disorder), recurrent, severe, 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HCC)  Severe recurrent major depression without psychotic features (HCC) 03/22/2020   Adjustment disorder with mixed anxiety and depressed mood 01/10/2020   Polysubstance abuse (HCC) 01/10/2020   Marihuana abuse 06/16/2019   Nicotine  dependence, unspecified, uncomplicated 06/16/2019   Gastroesophageal reflux disease 03/08/2017   Anxiety 03/08/2017    Opioid dependence with withdrawal (HCC) 09/24/2016   Cocaine abuse (HCC) 09/24/2016   Serotonin syndrome 09/17/2016   Altered mental status 09/17/2016   Bicornuate uterus 09/17/2016   Benign essential HTN 09/17/2016   Anxiety and depression 10/10/2015   History of intravenous drug use in remission 09/07/2011   Essential hypertension    Bipolar disorder (HCC)    Bicornuate uterus     Allergies:  Allergies  Allergen Reactions   Hydrocodone -Acetaminophen  Nausea Only and Other (See Comments)   Medications:  Current Outpatient Medications:    amLODipine  (NORVASC ) 10 MG tablet, Take 1 tablet (10 mg total) by mouth daily., Disp: 90 tablet, Rfl: 1   ARIPiprazole  (ABILIFY ) 10 MG tablet, Take 1 tablet (10 mg total) by mouth daily for mood, Disp: 30 tablet, Rfl: 1   ARIPiprazole  (ABILIFY ) 10 MG tablet, Take 1 tablet (10 mg total) by mouth daily for mood., Disp: 90 tablet, Rfl: 1   ARIPiprazole  (ABILIFY ) 15 MG tablet, Take 1 tablet (15 mg total) by mouth daily for mood., Disp: 90 tablet, Rfl: 1   atomoxetine  (STRATTERA ) 80 MG capsule, Take 1 capsule (80 mg total) by mouth daily., Disp: 90 capsule, Rfl: 1   buPROPion  (WELLBUTRIN  SR) 150 MG 12 hr tablet, Take 1 tablet (150 mg total) by mouth daily for 7 days, THEN 1 tablet (150 mg total) 2 (two) times daily for focus and mood, Disp: 60 tablet, Rfl: 0   diclofenac  Sodium (VOLTAREN  ARTHRITIS PAIN) 1 % GEL, Apply 2 g topically 4 (four) times daily., Disp: 350 g, Rfl: 1   hydrochlorothiazide  (HYDRODIURIL ) 25 MG tablet, Take 1 tablet (25 mg total) by mouth daily., Disp: 90 tablet, Rfl: 1   ibuprofen  (ADVIL ) 800 MG tablet, Take 1 tablet (800 mg total) by mouth every 8 (eight) hours as needed., Disp: 30 tablet, Rfl: 0   lisinopril  (ZESTRIL ) 20 MG tablet, Take 1 tablet (20 mg total) by mouth daily., Disp: 90 tablet, Rfl: 1   naltrexone  (DEPADE) 50 MG tablet, Take 1 tablet (50 mg total) by mouth daily., Disp: 90 tablet, Rfl: 0   naltrexone  (DEPADE) 50 MG  tablet, Take 1 tablet (50 mg total) by mouth daily., Disp: 90 tablet, Rfl: 0   omeprazole  (PRILOSEC) 40 MG capsule, Take 1 capsule (40 mg total) by mouth daily., Disp: 90 capsule, Rfl: 0   sertraline  (ZOLOFT ) 100 MG tablet, Take 1 tablet (100 mg total) by mouth daily for mood., Disp: 30 tablet, Rfl: 1   sertraline  (ZOLOFT ) 100 MG tablet, Take 1 tablet (100 mg total) by mouth daily for mood., Disp: 90 tablet, Rfl: 1   sertraline  (ZOLOFT ) 100 MG tablet, Take 1 tablet (100 mg total) by mouth daily., Disp: 90 tablet, Rfl: 1   sertraline  (ZOLOFT ) 50 MG tablet, Take 1 tablet (50 mg total) by mouth daily., Disp: 30 tablet, Rfl: 0   traZODone  (DESYREL ) 50 MG tablet, Take 1 tablet (50 mg total) by mouth at bedtime., Disp: 30 tablet, Rfl: 2   traZODone  (DESYREL ) 50 MG tablet, Take 1 tablet (50 mg total) by mouth daily for sleep, Disp: 30 tablet, Rfl: 0  Observations/Objective: Patient is well-developed, well-nourished in no acute distress.  Resting comfortably at home.  Head  is normocephalic, atraumatic.  No labored breathing.  Speech is clear and coherent with logical content.  Patient is alert and oriented at baseline.    Assessment and Plan: 1. Chronic tension-type headache, not intractable (Primary)  Supportive care discussed.  Pt reports today's headache is like her typical headache. In person evaluation precautions discussed.    Follow Up Instructions: I discussed the assessment and treatment plan with the patient. The patient was provided an opportunity to ask questions and all were answered. The patient agreed with the plan and demonstrated an understanding of the instructions.  A copy of instructions were sent to the patient via MyChart unless otherwise noted below.     The patient was advised to call back or seek an in-person evaluation if the symptoms worsen or if the condition fails to improve as anticipated.    Char Common Ward, PA-C

## 2024-05-27 ENCOUNTER — Telehealth: Payer: MEDICAID | Admitting: Family Medicine

## 2024-05-27 DIAGNOSIS — G44229 Chronic tension-type headache, not intractable: Secondary | ICD-10-CM | POA: Diagnosis not present

## 2024-05-27 NOTE — Progress Notes (Signed)
 Virtual Visit Consent   [name], you are scheduled for a virtual visit with a [name] provider today. Just as with appointments in the office, your consent must be obtained to participate. Your consent will be active for this visit and any virtual visit you may have with one of our providers in the next 365 days. If you have a MyChart account, a copy of this consent can be sent to you electronically.  As this is a virtual visit, video technology does not allow for your provider to perform a traditional examination. This may limit your provider's ability to fully assess your condition. If your provider identifies any concerns that need to be evaluated in person or the need to arrange testing (such as labs, EKG, etc.), we will make arrangements to do so. Although advances in technology are sophisticated, we cannot ensure that it will always work on either your end or our end. If the connection with a video visit is poor, the visit may have to be switched to a telephone visit. With either a video or telephone visit, we are not always able to ensure that we have a secure connection.  By engaging in this virtual visit, you consent to the provision of healthcare and authorize for your insurance to be billed (if applicable) for the services provided during this visit. Depending on your insurance coverage, you may receive a charge related to this service.  I need to obtain your verbal consent now. Are you willing to proceed with your visit today? Desire Kettering has provided verbal consent on 05/27/2024 for a virtual visit (video or telephone). [name], [place]  Date: 05/27/2024 3:16 PM   Virtual Visit via Video Note   I, [name], connected with  [name]  ([id number], [birth year]) on 05/27/24 at  3:15 PM EDT by a video-enabled telemedicine application and verified that I am speaking with the correct person using two identifiers.  Location: Patient: Virtual Visit Location Patient:  Home Provider: Virtual Visit Location Provider: Home Office   I discussed the limitations of evaluation and management by telemedicine and the availability of in person appointments. The patient expressed understanding and agreed to proceed.    History of Present Illness: [name] is a 48 y.o. who identifies as a female who was assigned female at birth, and is being seen today for c/o being in bed for 2 days for a really bad headache.  Pt states taking Imitrex  but still has a bad headache.  Pt is requesting a note because she missed work. Pt states she only has this headache because she has been under a lot of stress.   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   Severe alcohol use disorder (HCC) 07/01/2020   Major depressive disorder, recurrent severe without psychotic features (HCC) 07/01/2020   Pica 05/21/2020   Prediabetes 05/21/2020   Alcohol use disorder, severe, dependence (HCC) 05/21/2020   Attention-deficit hyperactivity disorder, unspecified type 04/01/2020   Bipolar disorder, unspecified (HCC) 04/01/2020   Diarrhea 03/24/2020   Hypokalemia 03/24/2020   Iron deficiency anemia 03/24/2020   MDD (major depressive disorder), recurrent, severe, 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HCC)    Severe recurrent major depression without psychotic features (HCC) 03/22/2020   Adjustment disorder with mixed anxiety and depressed  mood 01/10/2020   Polysubstance abuse (HCC) 01/10/2020   Marihuana abuse 06/16/2019   Nicotine  dependence, unspecified, uncomplicated 06/16/2019   Gastroesophageal reflux disease 03/08/2017   Anxiety 03/08/2017   Opioid dependence with withdrawal (HCC) 09/24/2016   Cocaine abuse (HCC) 09/24/2016   Serotonin syndrome  09/17/2016   Altered mental status 09/17/2016   Bicornuate uterus 09/17/2016   Benign essential HTN 09/17/2016   Anxiety and depression 10/10/2015   History of intravenous drug use in remission 09/07/2011   Essential hypertension    Bipolar disorder (HCC)    Bicornuate uterus     Allergies:  Allergies  Allergen Reactions   Hydrocodone -Acetaminophen  Nausea Only and Other (See Comments)   Medications:  Current Outpatient Medications:    amLODipine  (NORVASC ) 10 MG tablet, Take 1 tablet (10 mg total) by mouth daily., Disp: 90 tablet, Rfl: 1   ARIPiprazole  (ABILIFY ) 10 MG tablet, Take 1 tablet (10 mg total) by mouth daily for mood, Disp: 30 tablet, Rfl: 1   ARIPiprazole  (ABILIFY ) 10 MG tablet, Take 1 tablet (10 mg total) by mouth daily for mood., Disp: 90 tablet, Rfl: 1   ARIPiprazole  (ABILIFY ) 15 MG tablet, Take 1 tablet (15 mg total) by mouth daily for mood., Disp: 90 tablet, Rfl: 1   atomoxetine  (STRATTERA ) 80 MG capsule, Take 1 capsule (80 mg total) by mouth daily., Disp: 90 capsule, Rfl: 1   buPROPion  (WELLBUTRIN  SR) 150 MG 12 hr tablet, Take 1 tablet (150 mg total) by mouth daily for 7 days, THEN 1 tablet (150 mg total) 2 (two) times daily for focus and mood, Disp: 60 tablet, Rfl: 0   diclofenac  Sodium (VOLTAREN  ARTHRITIS PAIN) 1 % GEL, Apply 2 g topically 4 (four) times daily., Disp: 350 g, Rfl: 1   hydrochlorothiazide  (HYDRODIURIL ) 25 MG tablet, Take 1 tablet (25 mg total) by mouth daily., Disp: 90 tablet, Rfl: 1   ibuprofen  (ADVIL ) 800 MG tablet, Take 1 tablet (800 mg total) by mouth every 8 (eight) hours as needed., Disp: 30 tablet, Rfl: 0   lisinopril  (ZESTRIL ) 20 MG tablet, Take 1 tablet (20 mg total) by mouth daily., Disp: 90 tablet, Rfl: 1   naltrexone  (DEPADE) 50 MG tablet, Take 1 tablet (50 mg total) by mouth daily., Disp: 90 tablet, Rfl: 0   naltrexone  (DEPADE) 50 MG tablet, Take 1 tablet (50 mg total) by mouth daily., Disp: 90 tablet, Rfl: 0   omeprazole  (PRILOSEC) 40 MG  capsule, Take 1 capsule (40 mg total) by mouth daily., Disp: 90 capsule, Rfl: 0   sertraline  (ZOLOFT ) 100 MG tablet, Take 1 tablet (100 mg total) by mouth daily for mood., Disp: 30 tablet, Rfl: 1   sertraline  (ZOLOFT ) 100 MG tablet, Take 1 tablet (100 mg total) by mouth daily for mood., Disp: 90 tablet, Rfl: 1   sertraline  (ZOLOFT ) 100 MG tablet, Take 1 tablet (100 mg total) by mouth daily., Disp: 90 tablet, Rfl: 1   sertraline  (ZOLOFT ) 50 MG tablet, Take 1 tablet (50 mg total) by mouth daily., Disp: 30 tablet, Rfl: 0   traZODone  (DESYREL ) 50 MG tablet, Take 1 tablet (50 mg total) by mouth at bedtime., Disp: 30 tablet, Rfl: 2   traZODone  (DESYREL ) 50 MG tablet, Take 1 tablet (50 mg total) by mouth daily for sleep, Disp: 30 tablet, Rfl: 0  Observations/Objective: Patient is well-developed, well-nourished in no acute distress.  Resting comfortably at home.  Head is normocephalic, atraumatic.  No labored breathing.  Speech is clear and coherent with logical content.  Patient  is alert and oriented at baseline.    Assessment and Plan: 1. Chronic tension-type headache, not intractable (Primary)  -Advised Pt to follow up with PCP for worsening headaches and to discuss medication changes -Work note provided.   Follow Up Instructions: I discussed the assessment and treatment plan with the patient. The patient was provided an opportunity to ask questions and all were answered. The patient agreed with the plan and demonstrated an understanding of the instructions.  A copy of instructions were sent to the patient via MyChart unless otherwise noted below.    The patient was advised to call back or seek an in-person evaluation if the symptoms worsen or if the condition fails to improve as anticipated.    Debbie Mater, PA-C

## 2024-05-27 NOTE — Patient Instructions (Signed)
 [name], thank you for joining [name], PA-C for today's virtual visit.  While this provider is not your primary care provider (PCP), if your PCP is located in our provider database this encounter information will be shared with them immediately following your visit.   A [facility] MyChart account gives you access to today's visit and all your visits, tests, and labs performed at [facility]  click here if you don't have a [facility] MyChart account or go to [email or web address]  Consent: (Patient) [name] provided verbal consent for this virtual visit at the beginning of the encounter.  Current Medications:  Current Outpatient Medications:    amLODipine  (NORVASC ) 10 MG tablet, Take 1 tablet (10 mg total) by mouth daily., Disp: 90 tablet, Rfl: 1   ARIPiprazole  (ABILIFY ) 10 MG tablet, Take 1 tablet (10 mg total) by mouth daily for mood, Disp: 30 tablet, Rfl: 1   ARIPiprazole  (ABILIFY ) 10 MG tablet, Take 1 tablet (10 mg total) by mouth daily for mood., Disp: 90 tablet, Rfl: 1   ARIPiprazole  (ABILIFY ) 15 MG tablet, Take 1 tablet (15 mg total) by mouth daily for mood., Disp: 90 tablet, Rfl: 1   atomoxetine  (STRATTERA ) 80 MG capsule, Take 1 capsule (80 mg total) by mouth daily., Disp: 90 capsule, Rfl: 1   buPROPion  (WELLBUTRIN  SR) 150 MG 12 hr tablet, Take 1 tablet (150 mg total) by mouth daily for 7 days, THEN 1 tablet (150 mg total) 2 (two) times daily for focus and mood, Disp: 60 tablet, Rfl: 0   diclofenac  Sodium (VOLTAREN  ARTHRITIS PAIN) 1 % GEL, Apply 2 g topically 4 (four) times daily., Disp: 350 g, Rfl: 1   hydrochlorothiazide  (HYDRODIURIL ) 25 MG tablet, Take 1 tablet (25 mg total) by mouth daily., Disp: 90 tablet, Rfl: 1   ibuprofen  (ADVIL ) 800 MG tablet, Take 1 tablet (800 mg total) by mouth every 8 (eight) hours as needed., Disp: 30 tablet, Rfl: 0   lisinopril  (ZESTRIL ) 20 MG tablet, Take 1 tablet (20 mg total) by mouth daily., Disp: 90 tablet,  Rfl: 1   naltrexone  (DEPADE) 50 MG tablet, Take 1 tablet (50 mg total) by mouth daily., Disp: 90 tablet, Rfl: 0   naltrexone  (DEPADE) 50 MG tablet, Take 1 tablet (50 mg total) by mouth daily., Disp: 90 tablet, Rfl: 0   omeprazole  (PRILOSEC) 40 MG capsule, Take 1 capsule (40 mg total) by mouth daily., Disp: 90 capsule, Rfl: 0   sertraline  (ZOLOFT ) 100 MG tablet, Take 1 tablet (100 mg total) by mouth daily for mood., Disp: 30 tablet, Rfl: 1   sertraline  (ZOLOFT ) 100 MG tablet, Take 1 tablet (100 mg total) by mouth daily for mood., Disp: 90 tablet, Rfl: 1   sertraline  (ZOLOFT ) 100 MG tablet, Take 1 tablet (100 mg total) by mouth daily., Disp: 90 tablet, Rfl: 1   sertraline  (ZOLOFT ) 50 MG tablet, Take 1 tablet (50 mg total) by mouth daily., Disp: 30 tablet, Rfl: 0   traZODone  (DESYREL ) 50 MG tablet, Take 1 tablet (50 mg total) by mouth at bedtime., Disp: 30 tablet, Rfl: 2   traZODone  (DESYREL ) 50 MG tablet, Take 1 tablet (50 mg total) by mouth daily for sleep, Disp: 30 tablet, Rfl: 0   Medications ordered in this encounter:  No orders of the defined types were placed in this encounter.    *If you need refills on other medications prior to your next appointment, please contact your pharmacy*  Follow-Up: Call back or seek an in-person evaluation if the  symptoms worsen or if the condition fails to improve as anticipated.  [facility] [phone]  Other Instructions General Headache Without Cause A headache is pain or discomfort you feel around the head or neck area. There are many causes and types of headaches. In some cases, the cause may not be found. Follow these instructions at home: Watch your condition for any changes. Let your doctor know about them. Take these steps to help with your condition: Managing pain     Take over-the-counter and prescription medicines only as told by your doctor. This includes medicines for pain that are taken by mouth or put on the skin. Lie  down in a dark, quiet room when you have a headache. If told, put ice on your head and neck area: Put ice in a plastic bag. Place a towel between your skin and the bag. Leave the ice on for 20 minutes, 2-3 times per day. Take off the ice if your skin turns bright red. This is very important. If you cannot feel pain, heat, or cold, you have a greater risk of damage to the area. If told, put heat on the affected area. Use the heat source that your doctor recommends, such as a moist heat pack or a heating pad. Place a towel between your skin and the heat source. Leave the heat on for 20-30 minutes. Take off the heat if your skin turns bright red. This is very important. If you cannot feel pain, heat, or cold, you have a greater risk of getting burned. Keep lights dim if bright lights bother you or make your headaches worse. Eating and drinking Eat meals on a regular schedule. If you drink alcohol: Limit how much you have to: 0-1 drink a day for women who are not pregnant. 0-2 drinks a day for men. Know how much alcohol is in a drink. In the [place] one drink equals one 12 oz bottle of beer (355 mL), one 5 oz glass of wine (148 mL), or one 1 oz glass of hard liquor (44 mL). Stop drinking caffeine, or drink less caffeine. General instructions  Keep a journal to find out if certain things bring on headaches. For example, write down: What you eat and drink. How much sleep you get. Any change to your diet or medicines. Get a massage or try other ways to relax. Limit stress. Sit up straight. Do not tighten (tense) your muscles. Do not smoke or use any products that contain nicotine  or tobacco. If you need help quitting, ask your doctor. Exercise regularly as told by your doctor. Get enough sleep. This often means 7-9 hours of sleep each night. Keep all follow-up visits. This is important. Contact a doctor if: Medicine does not help your symptoms. You have a headache that feels different than  the other headaches. You feel like you may vomit (nauseous) or you vomit. You have a fever. Get help right away if: Your headache: Gets very bad quickly. Gets worse after a lot of physical activity. You have any of these symptoms: You continue to vomit. A stiff neck. Trouble seeing. Your eye or ear hurts. Trouble speaking. Weak muscles or you lose muscle control. You lose your balance or have trouble walking. You feel like you will pass out (faint) or you pass out. You are mixed up (confused). You have a seizure. These symptoms may be an emergency. Get help right away. Call your local emergency services (911 in the [place] Do not wait to  see if the symptoms will go away. Do not drive yourself to the hospital. Summary A headache is pain or discomfort that is felt around the head or neck area. There are many causes and types of headaches. In some cases, the cause may not be found. Keep a journal to help find out what causes your headaches. Watch your condition for any changes. Let your doctor know about them. Contact a doctor if you have a headache that is different from usual, or if medicine does not help your headache. Get help right away if your headache gets very bad, you throw up, you have trouble seeing, you lose your balance, or you have a seizure. This information is not intended to replace advice given to you by your health care provider. Make sure you discuss any questions you have with your health care provider. Document Revised: 04/09/2021 Document Reviewed: 04/09/2021 Elsevier Patient Education  2024 Elsevier Inc.   If you have been instructed to have an in-person evaluation today at a local Urgent Care facility, please use the link below. It will take you to a list of all of our available [facility], including address, phone number and hours of operation. Please do not delay care.  [facility]  If you or a family member do not have a primary  care provider, use the link below to schedule a visit and establish care. When you choose a [facility] primary care physician or advanced practice provider, you gain a long-term partner in health. Find a Primary Care Provider  Learn more about [facility]'s in-office and virtual care options:  - Get Care Now

## 2024-07-28 ENCOUNTER — Telehealth: Payer: MEDICAID | Admitting: Family Medicine

## 2024-07-28 DIAGNOSIS — G43109 Migraine with aura, not intractable, without status migrainosus: Secondary | ICD-10-CM | POA: Diagnosis not present

## 2024-07-28 MED ORDER — SUMATRIPTAN SUCCINATE 100 MG PO TABS: 100.0000 mg | ORAL_TABLET | ORAL | 0 refills | Status: AC | PRN

## 2024-07-28 NOTE — Patient Instructions (Signed)

## 2024-07-28 NOTE — Progress Notes (Signed)
 Virtual Visit Consent   [name], you are scheduled for a virtual visit with a [facility] provider today. Just as with appointments in the office, your consent must be obtained to participate. Your consent will be active for this visit and any virtual visit you may have with one of our providers in the next 365 days. If you have a MyChart account, a copy of this consent can be sent to you electronically.  As this is a virtual visit, video technology does not allow for your provider to perform a traditional examination. This may limit your provider's ability to fully assess your condition. If your provider identifies any concerns that need to be evaluated in person or the need to arrange testing (such as labs, EKG, etc.), we will make arrangements to do so. Although advances in technology are sophisticated, we cannot ensure that it will always work on either your end or our end. If the connection with a video visit is poor, the visit may have to be switched to a telephone visit. With either a video or telephone visit, we are not always able to ensure that we have a secure connection.  By engaging in this virtual visit, you consent to the provision of healthcare and authorize for your insurance to be billed (if applicable) for the services provided during this visit. Depending on your insurance coverage, you may receive a charge related to this service.  I need to obtain your verbal consent now. Are you willing to proceed with your visit today? [name] has provided verbal consent on 07/28/2024 for a virtual visit (video or telephone). Loa Lamp, FNP  Date: 07/28/2024 2:01 PM   Virtual Visit via Video Note   I, Loa Lamp, connected with  [name]  ([id number], [birth year]) on 07/28/24 at  2:00 PM EDT by a video-enabled telemedicine application and verified that I am speaking with the correct person using two identifiers.  Location: Patient: Virtual Visit Location Patient:  Home Provider: Virtual Visit Location Provider: Home Office   I discussed the limitations of evaluation and management by telemedicine and the availability of in person appointments. The patient expressed understanding and agreed to proceed.    History of Present Illness: [name] is a 48 y.o. who identifies as a female who was assigned female at birth, and is being seen today for migraine since yesterday. It Is her typical migraine with light sensitivity and nausea. She is out of imitrex  which works for this and needs a work note. 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   Severe alcohol use disorder (HCC) 07/01/2020   Major depressive disorder, recurrent severe without psychotic features (HCC) 07/01/2020   Pica 05/21/2020   Prediabetes 05/21/2020   Alcohol use disorder, severe, dependence (HCC) 05/21/2020   Attention-deficit hyperactivity disorder, unspecified type 04/01/2020   Bipolar disorder, unspecified (HCC) 04/01/2020   Diarrhea 03/24/2020   Hypokalemia 03/24/2020   Iron deficiency anemia 03/24/2020   MDD (major depressive disorder), recurrent, severe, 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HCC)    Severe recurrent major depression without psychotic features (HCC) 03/22/2020   Adjustment disorder with mixed anxiety and depressed mood 01/10/2020   Polysubstance abuse (HCC) 01/10/2020   Marihuana abuse 06/16/2019   Nicotine  dependence, unspecified, uncomplicated 06/16/2019  Gastroesophageal reflux disease 03/08/2017   Anxiety 03/08/2017   Opioid dependence with withdrawal (HCC) 09/24/2016   Cocaine abuse (HCC) 09/24/2016   Serotonin syndrome 09/17/2016   Altered mental status 09/17/2016   Bicornuate uterus 09/17/2016   Benign  essential HTN 09/17/2016   Anxiety and depression 10/10/2015   History of intravenous drug use in remission 09/07/2011   Essential hypertension    Bipolar disorder (HCC)    Bicornuate uterus     Allergies:  Allergies  Allergen Reactions   Hydrocodone -Acetaminophen  Nausea Only and Other (See Comments)   Medications:  Current Outpatient Medications:    SUMAtriptan  (IMITREX ) 100 MG tablet, Take 1 tablet (100 mg total) by mouth every 2 (two) hours as needed for migraine. May repeat in 2 hours if headache persists or recurs., Disp: 10 tablet, Rfl: 0   amLODipine  (NORVASC ) 10 MG tablet, Take 1 tablet (10 mg total) by mouth daily., Disp: 90 tablet, Rfl: 1   ARIPiprazole  (ABILIFY ) 10 MG tablet, Take 1 tablet (10 mg total) by mouth daily for mood, Disp: 30 tablet, Rfl: 1   ARIPiprazole  (ABILIFY ) 10 MG tablet, Take 1 tablet (10 mg total) by mouth daily for mood., Disp: 90 tablet, Rfl: 1   ARIPiprazole  (ABILIFY ) 15 MG tablet, Take 1 tablet (15 mg total) by mouth daily for mood., Disp: 90 tablet, Rfl: 1   atomoxetine  (STRATTERA ) 80 MG capsule, Take 1 capsule (80 mg total) by mouth daily., Disp: 90 capsule, Rfl: 1   buPROPion  (WELLBUTRIN  SR) 150 MG 12 hr tablet, Take 1 tablet (150 mg total) by mouth daily for 7 days, THEN 1 tablet (150 mg total) 2 (two) times daily for focus and mood, Disp: 60 tablet, Rfl: 0   diclofenac  Sodium (VOLTAREN  ARTHRITIS PAIN) 1 % GEL, Apply 2 g topically 4 (four) times daily., Disp: 350 g, Rfl: 1   hydrochlorothiazide  (HYDRODIURIL ) 25 MG tablet, Take 1 tablet (25 mg total) by mouth daily., Disp: 90 tablet, Rfl: 1   ibuprofen  (ADVIL ) 800 MG tablet, Take 1 tablet (800 mg total) by mouth every 8 (eight) hours as needed., Disp: 30 tablet, Rfl: 0   lisinopril  (ZESTRIL ) 20 MG tablet, Take 1 tablet (20 mg total) by mouth daily., Disp: 90 tablet, Rfl: 1   naltrexone  (DEPADE) 50 MG tablet, Take 1 tablet (50 mg total) by mouth daily., Disp: 90 tablet, Rfl: 0   naltrexone  (DEPADE) 50 MG  tablet, Take 1 tablet (50 mg total) by mouth daily., Disp: 90 tablet, Rfl: 0   omeprazole  (PRILOSEC) 40 MG capsule, Take 1 capsule (40 mg total) by mouth daily., Disp: 90 capsule, Rfl: 0   sertraline  (ZOLOFT ) 100 MG tablet, Take 1 tablet (100 mg total) by mouth daily for mood., Disp: 30 tablet, Rfl: 1   sertraline  (ZOLOFT ) 100 MG tablet, Take 1 tablet (100 mg total) by mouth daily for mood., Disp: 90 tablet, Rfl: 1   sertraline  (ZOLOFT ) 100 MG tablet, Take 1 tablet (100 mg total) by mouth daily., Disp: 90 tablet, Rfl: 1   sertraline  (ZOLOFT ) 50 MG tablet, Take 1 tablet (50 mg total) by mouth daily., Disp: 30 tablet, Rfl: 0   traZODone  (DESYREL ) 50 MG tablet, Take 1 tablet (50 mg total) by mouth at bedtime., Disp: 30 tablet, Rfl: 2   traZODone  (DESYREL ) 50 MG tablet, Take 1 tablet (50 mg total) by mouth daily for sleep, Disp: 30 tablet, Rfl: 0  Observations/Objective: Patient is well-developed, well-nourished in no acute distress.  Resting comfortably  at home.  Head is  normocephalic, atraumatic.  No labored breathing.  Speech is clear and coherent with logical content.  Patient is alert and oriented at baseline.    Assessment and Plan: 1. Migraine with aura and without status migrainosus, not intractable (Primary)  UC as needed if sx persist or worsen.   Follow Up Instructions: I discussed the assessment and treatment plan with the patient. The patient was provided an opportunity to ask questions and all were answered. The patient agreed with the plan and demonstrated an understanding of the instructions.  A copy of instructions were sent to the patient via MyChart unless otherwise noted below.     The patient was advised to call back or seek an in-person evaluation if the symptoms worsen or if the condition fails to improve as anticipated.    Bitha Fauteux, FNP
